# Patient Record
Sex: Female | Born: 1935 | Race: White | Hispanic: No
Health system: Midwestern US, Community
[De-identification: ages and names within clinical notes are randomized; demographics above are authoritative.]

## PROBLEM LIST (undated history)

## (undated) DIAGNOSIS — I739 Peripheral vascular disease, unspecified: Secondary | ICD-10-CM

## (undated) DIAGNOSIS — K449 Diaphragmatic hernia without obstruction or gangrene: Secondary | ICD-10-CM

## (undated) DIAGNOSIS — J310 Chronic rhinitis: Secondary | ICD-10-CM

## (undated) DIAGNOSIS — N879 Dysplasia of cervix uteri, unspecified: Secondary | ICD-10-CM

## (undated) DIAGNOSIS — F32A Depression, unspecified: Secondary | ICD-10-CM

## (undated) DIAGNOSIS — T8859XA Other complications of anesthesia, initial encounter: Secondary | ICD-10-CM

## (undated) DIAGNOSIS — I872 Venous insufficiency (chronic) (peripheral): Secondary | ICD-10-CM

## (undated) DIAGNOSIS — Z853 Personal history of malignant neoplasm of breast: Secondary | ICD-10-CM

## (undated) DIAGNOSIS — T4145XA Adverse effect of unspecified anesthetic, initial encounter: Secondary | ICD-10-CM

## (undated) DIAGNOSIS — N183 Chronic kidney disease, stage 3 unspecified: Secondary | ICD-10-CM

## (undated) DIAGNOSIS — K219 Gastro-esophageal reflux disease without esophagitis: Secondary | ICD-10-CM

## (undated) DIAGNOSIS — I219 Acute myocardial infarction, unspecified: Secondary | ICD-10-CM

## (undated) DIAGNOSIS — M1990 Unspecified osteoarthritis, unspecified site: Secondary | ICD-10-CM

## (undated) DIAGNOSIS — Z860101 Personal history of adenomatous and serrated colon polyps: Secondary | ICD-10-CM

## (undated) DIAGNOSIS — R7303 Prediabetes: Secondary | ICD-10-CM

## (undated) DIAGNOSIS — Z9889 Other specified postprocedural states: Secondary | ICD-10-CM

## (undated) DIAGNOSIS — Z87410 Personal history of cervical dysplasia: Secondary | ICD-10-CM

## (undated) DIAGNOSIS — C50911 Malignant neoplasm of unspecified site of right female breast: Secondary | ICD-10-CM

## (undated) DIAGNOSIS — D649 Anemia, unspecified: Secondary | ICD-10-CM

## (undated) DIAGNOSIS — E785 Hyperlipidemia, unspecified: Secondary | ICD-10-CM

## (undated) DIAGNOSIS — I10 Essential (primary) hypertension: Secondary | ICD-10-CM

## (undated) DIAGNOSIS — D509 Iron deficiency anemia, unspecified: Secondary | ICD-10-CM

## (undated) DIAGNOSIS — I771 Stricture of artery: Secondary | ICD-10-CM

## (undated) DIAGNOSIS — Z8679 Personal history of other diseases of the circulatory system: Secondary | ICD-10-CM

## (undated) DIAGNOSIS — G709 Myoneural disorder, unspecified: Secondary | ICD-10-CM

## (undated) DIAGNOSIS — F40240 Claustrophobia: Secondary | ICD-10-CM

## (undated) DIAGNOSIS — Z86010 Personal history of colonic polyps: Secondary | ICD-10-CM

## (undated) DIAGNOSIS — I499 Cardiac arrhythmia, unspecified: Secondary | ICD-10-CM

## (undated) DIAGNOSIS — I774 Celiac artery compression syndrome: Secondary | ICD-10-CM

## (undated) DIAGNOSIS — M810 Age-related osteoporosis without current pathological fracture: Secondary | ICD-10-CM

## (undated) DIAGNOSIS — R112 Nausea with vomiting, unspecified: Secondary | ICD-10-CM

## (undated) DIAGNOSIS — Z9989 Dependence on other enabling machines and devices: Secondary | ICD-10-CM

## (undated) DIAGNOSIS — G4733 Obstructive sleep apnea (adult) (pediatric): Secondary | ICD-10-CM

## (undated) DIAGNOSIS — Z8719 Personal history of other diseases of the digestive system: Secondary | ICD-10-CM

## (undated) DIAGNOSIS — E7800 Pure hypercholesterolemia, unspecified: Secondary | ICD-10-CM

## (undated) DIAGNOSIS — K559 Vascular disorder of intestine, unspecified: Secondary | ICD-10-CM

## (undated) DIAGNOSIS — R011 Cardiac murmur, unspecified: Secondary | ICD-10-CM

## (undated) HISTORY — PX: CATARACT EXTRACTION, BILATERAL: SHX1313

## (undated) HISTORY — PX: COLPOSCOPY: SHX161

## (undated) HISTORY — PX: FRACTURE SURGERY: SHX138

## (undated) HISTORY — PX: ORIF WRIST FRACTURE: SHX2133

## (undated) HISTORY — PX: TUBAL LIGATION: SHX77

## (undated) HISTORY — DX: Age-related osteoporosis without current pathological fracture: M81.0

## (undated) HISTORY — PX: KNEE ARTHROSCOPY: SUR90

## (undated) HISTORY — PX: JOINT REPLACEMENT: SHX530

## (undated) HISTORY — PX: WRIST FRACTURE SURGERY: SHX121

## (undated) HISTORY — PX: CERVICAL CONE BIOPSY: SUR198

## (undated) HISTORY — DX: Acute myocardial infarction, unspecified: I21.9

## (undated) HISTORY — DX: Unspecified osteoarthritis, unspecified site: M19.90

## (undated) HISTORY — DX: Pure hypercholesterolemia, unspecified: E78.00

## (undated) HISTORY — DX: Dysplasia of cervix uteri, unspecified: N87.9

## (undated) HISTORY — DX: Essential (primary) hypertension: I10

## (undated) HISTORY — DX: Vascular disorder of intestine, unspecified: K55.9

---

## 1973-03-05 HISTORY — PX: TUBAL LIGATION: SHX77

## 1997-09-14 ENCOUNTER — Emergency Department (HOSPITAL_COMMUNITY): Admission: EM | Admit: 1997-09-14 | Discharge: 1997-09-14 | Payer: Self-pay | Admitting: Emergency Medicine

## 1998-01-25 ENCOUNTER — Other Ambulatory Visit: Admission: RE | Admit: 1998-01-25 | Discharge: 1998-01-25 | Payer: Self-pay | Admitting: Gynecology

## 1998-03-05 DIAGNOSIS — Z923 Personal history of irradiation: Secondary | ICD-10-CM

## 1998-03-05 DIAGNOSIS — C801 Malignant (primary) neoplasm, unspecified: Secondary | ICD-10-CM

## 1998-03-05 DIAGNOSIS — Z9221 Personal history of antineoplastic chemotherapy: Secondary | ICD-10-CM

## 1998-03-05 HISTORY — DX: Personal history of antineoplastic chemotherapy: Z92.21

## 1998-03-05 HISTORY — PX: BREAST LUMPECTOMY WITH AXILLARY LYMPH NODE BIOPSY: SHX5593

## 1998-03-05 HISTORY — PX: BREAST BIOPSY: SHX20

## 1998-03-05 HISTORY — DX: Personal history of irradiation: Z92.3

## 1998-03-05 HISTORY — PX: BREAST LUMPECTOMY: SHX2

## 1998-03-05 HISTORY — DX: Malignant (primary) neoplasm, unspecified: C80.1

## 1998-03-23 ENCOUNTER — Other Ambulatory Visit: Admission: RE | Admit: 1998-03-23 | Discharge: 1998-03-23 | Payer: Self-pay | Admitting: Radiology

## 1998-04-01 ENCOUNTER — Ambulatory Visit (HOSPITAL_BASED_OUTPATIENT_CLINIC_OR_DEPARTMENT_OTHER): Admission: RE | Admit: 1998-04-01 | Discharge: 1998-04-01 | Payer: Self-pay | Admitting: General Surgery

## 1998-04-08 ENCOUNTER — Encounter: Payer: Self-pay | Admitting: General Surgery

## 1998-04-08 ENCOUNTER — Ambulatory Visit (HOSPITAL_COMMUNITY): Admission: RE | Admit: 1998-04-08 | Discharge: 1998-04-09 | Payer: Self-pay | Admitting: General Surgery

## 1998-04-14 ENCOUNTER — Encounter: Admission: RE | Admit: 1998-04-14 | Discharge: 1998-07-13 | Payer: Self-pay | Admitting: General Surgery

## 1998-04-14 ENCOUNTER — Encounter: Admission: RE | Admit: 1998-04-14 | Discharge: 1998-07-13 | Payer: Self-pay | Admitting: Radiation Oncology

## 1998-04-27 ENCOUNTER — Ambulatory Visit (HOSPITAL_COMMUNITY): Admission: RE | Admit: 1998-04-27 | Discharge: 1998-04-27 | Payer: Self-pay | Admitting: Oncology

## 1998-04-29 ENCOUNTER — Ambulatory Visit (HOSPITAL_COMMUNITY): Admission: RE | Admit: 1998-04-29 | Discharge: 1998-04-29 | Payer: Self-pay | Admitting: Oncology

## 1998-05-02 ENCOUNTER — Ambulatory Visit (HOSPITAL_COMMUNITY): Admission: RE | Admit: 1998-05-02 | Discharge: 1998-05-02 | Payer: Self-pay | Admitting: Oncology

## 1998-07-15 ENCOUNTER — Encounter: Payer: Self-pay | Admitting: Oncology

## 1998-07-15 ENCOUNTER — Inpatient Hospital Stay (HOSPITAL_COMMUNITY): Admission: EM | Admit: 1998-07-15 | Discharge: 1998-07-17 | Payer: Self-pay | Admitting: *Deleted

## 1998-08-05 ENCOUNTER — Encounter: Admission: RE | Admit: 1998-08-05 | Discharge: 1998-11-03 | Payer: Self-pay | Admitting: Radiation Oncology

## 1999-02-15 ENCOUNTER — Other Ambulatory Visit: Admission: RE | Admit: 1999-02-15 | Discharge: 1999-02-15 | Payer: Self-pay | Admitting: Gynecology

## 1999-03-24 ENCOUNTER — Encounter: Payer: Self-pay | Admitting: Orthopedic Surgery

## 1999-03-24 ENCOUNTER — Ambulatory Visit (HOSPITAL_COMMUNITY): Admission: RE | Admit: 1999-03-24 | Discharge: 1999-03-24 | Payer: Self-pay | Admitting: Orthopedic Surgery

## 1999-04-14 ENCOUNTER — Ambulatory Visit (HOSPITAL_COMMUNITY): Admission: RE | Admit: 1999-04-14 | Discharge: 1999-04-14 | Payer: Self-pay | Admitting: Gastroenterology

## 1999-09-08 ENCOUNTER — Encounter: Admission: RE | Admit: 1999-09-08 | Discharge: 1999-09-08 | Payer: Self-pay | Admitting: Gastroenterology

## 1999-09-08 ENCOUNTER — Encounter: Payer: Self-pay | Admitting: Gastroenterology

## 2000-02-11 ENCOUNTER — Encounter: Payer: Self-pay | Admitting: Emergency Medicine

## 2000-02-11 ENCOUNTER — Emergency Department (HOSPITAL_COMMUNITY): Admission: EM | Admit: 2000-02-11 | Discharge: 2000-02-11 | Payer: Self-pay | Admitting: Emergency Medicine

## 2000-02-16 ENCOUNTER — Ambulatory Visit (HOSPITAL_BASED_OUTPATIENT_CLINIC_OR_DEPARTMENT_OTHER): Admission: RE | Admit: 2000-02-16 | Discharge: 2000-02-16 | Payer: Self-pay | Admitting: Orthopedic Surgery

## 2000-02-23 ENCOUNTER — Other Ambulatory Visit: Admission: RE | Admit: 2000-02-23 | Discharge: 2000-02-23 | Payer: Self-pay | Admitting: Gynecology

## 2000-03-01 ENCOUNTER — Emergency Department (HOSPITAL_COMMUNITY): Admission: EM | Admit: 2000-03-01 | Discharge: 2000-03-01 | Payer: Self-pay | Admitting: Emergency Medicine

## 2000-03-29 ENCOUNTER — Emergency Department (HOSPITAL_COMMUNITY): Admission: EM | Admit: 2000-03-29 | Discharge: 2000-03-30 | Payer: Self-pay | Admitting: Internal Medicine

## 2000-04-26 ENCOUNTER — Other Ambulatory Visit: Admission: RE | Admit: 2000-04-26 | Discharge: 2000-04-26 | Payer: Self-pay | Admitting: Gynecology

## 2000-04-26 ENCOUNTER — Encounter (INDEPENDENT_AMBULATORY_CARE_PROVIDER_SITE_OTHER): Payer: Self-pay

## 2000-07-24 ENCOUNTER — Encounter: Payer: Self-pay | Admitting: Cardiology

## 2000-07-24 ENCOUNTER — Ambulatory Visit (HOSPITAL_COMMUNITY): Admission: RE | Admit: 2000-07-24 | Discharge: 2000-07-24 | Payer: Self-pay | Admitting: Cardiology

## 2001-02-19 ENCOUNTER — Other Ambulatory Visit: Admission: RE | Admit: 2001-02-19 | Discharge: 2001-02-19 | Payer: Self-pay | Admitting: Gynecology

## 2001-02-21 ENCOUNTER — Emergency Department (HOSPITAL_COMMUNITY): Admission: EM | Admit: 2001-02-21 | Discharge: 2001-02-21 | Payer: Self-pay

## 2001-02-21 ENCOUNTER — Encounter: Payer: Self-pay | Admitting: Gastroenterology

## 2001-02-21 ENCOUNTER — Encounter: Admission: RE | Admit: 2001-02-21 | Discharge: 2001-02-21 | Payer: Self-pay | Admitting: Gastroenterology

## 2001-03-04 ENCOUNTER — Encounter: Admission: RE | Admit: 2001-03-04 | Discharge: 2001-03-04 | Payer: Self-pay | Admitting: Orthopedic Surgery

## 2001-03-04 ENCOUNTER — Encounter: Payer: Self-pay | Admitting: Orthopedic Surgery

## 2001-04-30 ENCOUNTER — Encounter: Payer: Self-pay | Admitting: Orthopedic Surgery

## 2001-05-02 ENCOUNTER — Ambulatory Visit (HOSPITAL_COMMUNITY): Admission: RE | Admit: 2001-05-02 | Discharge: 2001-05-02 | Payer: Self-pay | Admitting: Orthopedic Surgery

## 2001-10-17 ENCOUNTER — Encounter: Payer: Self-pay | Admitting: Cardiology

## 2001-10-17 ENCOUNTER — Encounter: Admission: RE | Admit: 2001-10-17 | Discharge: 2001-10-17 | Payer: Self-pay | Admitting: Cardiology

## 2001-10-27 ENCOUNTER — Emergency Department (HOSPITAL_COMMUNITY): Admission: EM | Admit: 2001-10-27 | Discharge: 2001-10-27 | Payer: Self-pay | Admitting: Emergency Medicine

## 2001-12-02 ENCOUNTER — Encounter: Payer: Self-pay | Admitting: Gastroenterology

## 2001-12-02 ENCOUNTER — Emergency Department (HOSPITAL_COMMUNITY): Admission: EM | Admit: 2001-12-02 | Discharge: 2001-12-02 | Payer: Self-pay | Admitting: Emergency Medicine

## 2001-12-03 ENCOUNTER — Encounter: Payer: Self-pay | Admitting: Gastroenterology

## 2001-12-03 ENCOUNTER — Ambulatory Visit (HOSPITAL_COMMUNITY): Admission: RE | Admit: 2001-12-03 | Discharge: 2001-12-03 | Payer: Self-pay | Admitting: Gastroenterology

## 2002-03-20 ENCOUNTER — Other Ambulatory Visit: Admission: RE | Admit: 2002-03-20 | Discharge: 2002-03-20 | Payer: Self-pay | Admitting: Gynecology

## 2003-09-22 ENCOUNTER — Ambulatory Visit (HOSPITAL_BASED_OUTPATIENT_CLINIC_OR_DEPARTMENT_OTHER): Admission: RE | Admit: 2003-09-22 | Discharge: 2003-09-22 | Payer: Self-pay | Admitting: Orthopedic Surgery

## 2003-09-22 ENCOUNTER — Ambulatory Visit (HOSPITAL_COMMUNITY): Admission: RE | Admit: 2003-09-22 | Discharge: 2003-09-22 | Payer: Self-pay | Admitting: Orthopedic Surgery

## 2004-01-24 ENCOUNTER — Ambulatory Visit: Payer: Self-pay | Admitting: Oncology

## 2004-03-24 ENCOUNTER — Other Ambulatory Visit: Admission: RE | Admit: 2004-03-24 | Discharge: 2004-03-24 | Payer: Self-pay | Admitting: Gynecology

## 2004-12-09 ENCOUNTER — Emergency Department (HOSPITAL_COMMUNITY): Admission: EM | Admit: 2004-12-09 | Discharge: 2004-12-09 | Payer: Self-pay | Admitting: Emergency Medicine

## 2005-01-31 ENCOUNTER — Ambulatory Visit: Payer: Self-pay | Admitting: Oncology

## 2005-03-28 ENCOUNTER — Other Ambulatory Visit: Admission: RE | Admit: 2005-03-28 | Discharge: 2005-03-28 | Payer: Self-pay | Admitting: Gynecology

## 2006-01-20 ENCOUNTER — Ambulatory Visit: Payer: Self-pay | Admitting: Oncology

## 2006-01-23 LAB — COMPREHENSIVE METABOLIC PANEL
ALT: 16 U/L (ref 0–35)
CO2: 27 mEq/L (ref 19–32)
Chloride: 103 mEq/L (ref 96–112)
Sodium: 139 mEq/L (ref 135–145)
Total Bilirubin: 0.5 mg/dL (ref 0.3–1.2)
Total Protein: 8.1 g/dL (ref 6.0–8.3)

## 2006-01-23 LAB — CBC WITH DIFFERENTIAL/PLATELET
BASO%: 0.4 % (ref 0.0–2.0)
LYMPH%: 21.9 % (ref 14.0–48.0)
MCHC: 34.1 g/dL (ref 32.0–36.0)
MONO#: 0.4 10*3/uL (ref 0.1–0.9)
RBC: 3.82 10*6/uL (ref 3.70–5.32)
WBC: 4.6 10*3/uL (ref 3.9–10.0)
lymph#: 1 10*3/uL (ref 0.9–3.3)

## 2006-01-23 LAB — LACTATE DEHYDROGENASE: LDH: 137 U/L (ref 94–250)

## 2006-04-18 ENCOUNTER — Other Ambulatory Visit: Admission: RE | Admit: 2006-04-18 | Discharge: 2006-04-18 | Payer: Self-pay | Admitting: Gynecology

## 2006-11-11 ENCOUNTER — Ambulatory Visit (HOSPITAL_COMMUNITY): Admission: RE | Admit: 2006-11-11 | Discharge: 2006-11-11 | Payer: Self-pay | Admitting: Cardiology

## 2007-01-15 ENCOUNTER — Ambulatory Visit: Payer: Self-pay | Admitting: Oncology

## 2007-01-17 LAB — COMPREHENSIVE METABOLIC PANEL
ALT: 11 U/L (ref 0–35)
AST: 15 U/L (ref 0–37)
Alkaline Phosphatase: 66 U/L (ref 39–117)
Creatinine, Ser: 1.17 mg/dL (ref 0.40–1.20)
Total Bilirubin: 0.3 mg/dL (ref 0.3–1.2)

## 2007-01-17 LAB — CBC WITH DIFFERENTIAL/PLATELET
BASO%: 0.3 % (ref 0.0–2.0)
EOS%: 1.2 % (ref 0.0–7.0)
HCT: 32.7 % — ABNORMAL LOW (ref 34.8–46.6)
LYMPH%: 28.8 % (ref 14.0–48.0)
MCH: 32.8 pg (ref 26.0–34.0)
MCHC: 35.9 g/dL (ref 32.0–36.0)
MCV: 91.5 fL (ref 81.0–101.0)
MONO%: 7.3 % (ref 0.0–13.0)
NEUT%: 62.4 % (ref 39.6–76.8)
Platelets: 257 10*3/uL (ref 145–400)
lymph#: 1.7 10*3/uL (ref 0.9–3.3)

## 2007-02-28 ENCOUNTER — Ambulatory Visit: Payer: Self-pay | Admitting: Oncology

## 2007-04-25 ENCOUNTER — Other Ambulatory Visit: Admission: RE | Admit: 2007-04-25 | Discharge: 2007-04-25 | Payer: Self-pay | Admitting: Gynecology

## 2007-08-06 ENCOUNTER — Inpatient Hospital Stay (HOSPITAL_COMMUNITY): Admission: EM | Admit: 2007-08-06 | Discharge: 2007-08-07 | Payer: Self-pay | Admitting: Emergency Medicine

## 2008-03-10 ENCOUNTER — Ambulatory Visit: Payer: Self-pay | Admitting: Oncology

## 2008-03-19 LAB — COMPREHENSIVE METABOLIC PANEL
Albumin: 4.4 g/dL (ref 3.5–5.2)
CO2: 24 mEq/L (ref 19–32)
Calcium: 9.4 mg/dL (ref 8.4–10.5)
Glucose, Bld: 106 mg/dL — ABNORMAL HIGH (ref 70–99)
Potassium: 3.8 mEq/L (ref 3.5–5.3)
Sodium: 141 mEq/L (ref 135–145)
Total Protein: 7.2 g/dL (ref 6.0–8.3)

## 2008-03-19 LAB — CBC WITH DIFFERENTIAL/PLATELET
Eosinophils Absolute: 0.1 10*3/uL (ref 0.0–0.5)
MONO#: 0.5 10*3/uL (ref 0.1–0.9)
NEUT#: 3.5 10*3/uL (ref 1.5–6.5)
Platelets: 226 10*3/uL (ref 145–400)
RBC: 3.52 10*6/uL — ABNORMAL LOW (ref 3.70–5.32)
RDW: 13 % (ref 11.3–14.5)
WBC: 5.7 10*3/uL (ref 3.9–10.0)

## 2008-03-19 LAB — LACTATE DEHYDROGENASE: LDH: 144 U/L (ref 94–250)

## 2008-05-07 ENCOUNTER — Other Ambulatory Visit: Admission: RE | Admit: 2008-05-07 | Discharge: 2008-05-07 | Payer: Self-pay | Admitting: Gynecology

## 2008-05-07 ENCOUNTER — Encounter: Payer: Self-pay | Admitting: Gynecology

## 2008-05-07 ENCOUNTER — Ambulatory Visit: Payer: Self-pay | Admitting: Gynecology

## 2008-09-23 ENCOUNTER — Emergency Department (HOSPITAL_COMMUNITY): Admission: EM | Admit: 2008-09-23 | Discharge: 2008-09-24 | Payer: Self-pay | Admitting: Emergency Medicine

## 2009-03-16 ENCOUNTER — Ambulatory Visit (HOSPITAL_BASED_OUTPATIENT_CLINIC_OR_DEPARTMENT_OTHER): Payer: MEDICARE | Admitting: Oncology

## 2009-03-18 LAB — CBC WITH DIFFERENTIAL/PLATELET
EOS%: 1.9 % (ref 0.0–7.0)
HGB: 12 g/dL (ref 11.6–15.9)
MCH: 31.5 pg (ref 25.1–34.0)
MCV: 93.7 fL (ref 79.5–101.0)
MONO%: 10 % (ref 0.0–14.0)
NEUT#: 3.4 10*3/uL (ref 1.5–6.5)
RBC: 3.81 10*6/uL (ref 3.70–5.45)
RDW: 12.7 % (ref 11.2–14.5)
lymph#: 1.7 10*3/uL (ref 0.9–3.3)

## 2009-03-18 LAB — COMPREHENSIVE METABOLIC PANEL
ALT: 18 U/L (ref 0–35)
AST: 19 U/L (ref 0–37)
Albumin: 4.3 g/dL (ref 3.5–5.2)
Alkaline Phosphatase: 58 U/L (ref 39–117)
Calcium: 9.8 mg/dL (ref 8.4–10.5)
Chloride: 103 mEq/L (ref 96–112)
Potassium: 3.7 mEq/L (ref 3.5–5.3)
Sodium: 137 mEq/L (ref 135–145)
Total Protein: 7.4 g/dL (ref 6.0–8.3)

## 2009-07-08 ENCOUNTER — Emergency Department (HOSPITAL_COMMUNITY): Admission: EM | Admit: 2009-07-08 | Discharge: 2009-07-08 | Payer: Self-pay | Admitting: Emergency Medicine

## 2009-09-02 ENCOUNTER — Ambulatory Visit: Payer: Self-pay | Admitting: Gynecology

## 2009-09-02 ENCOUNTER — Other Ambulatory Visit: Admission: RE | Admit: 2009-09-02 | Discharge: 2009-09-02 | Payer: Self-pay | Admitting: Gynecology

## 2010-01-03 ENCOUNTER — Ambulatory Visit: Payer: Self-pay | Admitting: Gynecology

## 2010-01-11 ENCOUNTER — Ambulatory Visit: Payer: Self-pay | Admitting: Gynecology

## 2010-03-26 ENCOUNTER — Encounter: Payer: Self-pay | Admitting: Cardiology

## 2010-04-07 ENCOUNTER — Ambulatory Visit: Payer: MEDICARE | Admitting: Oncology

## 2010-04-07 DIAGNOSIS — C50919 Malignant neoplasm of unspecified site of unspecified female breast: Secondary | ICD-10-CM

## 2010-04-07 DIAGNOSIS — Z170 Estrogen receptor positive status [ER+]: Secondary | ICD-10-CM

## 2010-04-07 LAB — CBC WITH DIFFERENTIAL/PLATELET
BASO%: 0.4 % (ref 0.0–2.0)
EOS%: 0.9 % (ref 0.0–7.0)
Eosinophils Absolute: 0 10*3/uL (ref 0.0–0.5)
LYMPH%: 25.8 % (ref 14.0–49.7)
MCH: 32.4 pg (ref 25.1–34.0)
MCHC: 34.5 g/dL (ref 31.5–36.0)
MCV: 93.9 fL (ref 79.5–101.0)
MONO%: 8.4 % (ref 0.0–14.0)
Platelets: 251 10*3/uL (ref 145–400)
RBC: 3.66 10*6/uL — ABNORMAL LOW (ref 3.70–5.45)

## 2010-04-07 LAB — COMPREHENSIVE METABOLIC PANEL
Albumin: 4.6 g/dL (ref 3.5–5.2)
Alkaline Phosphatase: 61 U/L (ref 39–117)
BUN: 21 mg/dL (ref 6–23)
Calcium: 9.5 mg/dL (ref 8.4–10.5)
Creatinine, Ser: 0.95 mg/dL (ref 0.40–1.20)
Glucose, Bld: 91 mg/dL (ref 70–99)
Potassium: 4.2 mEq/L (ref 3.5–5.3)

## 2010-04-07 LAB — MORPHOLOGY

## 2010-04-21 ENCOUNTER — Encounter (HOSPITAL_BASED_OUTPATIENT_CLINIC_OR_DEPARTMENT_OTHER): Payer: MEDICARE | Admitting: Oncology

## 2010-04-21 DIAGNOSIS — C50919 Malignant neoplasm of unspecified site of unspecified female breast: Secondary | ICD-10-CM

## 2010-07-21 NOTE — Op Note (Signed)
NAME:  Wanda Green, Wanda Green                         ACCOUNT NO.:  [id number]   MEDICAL RECORD NO.:  [id number]                   PATIENT TYPE:  AMB   LOCATION:  DSC                                  FACILITY:  [facility]   PHYSICIAN:  Marlowe Kays, M.D.               DATE OF BIRTH:  24-Dec-1934   DATE OF PROCEDURE:  09/22/2003  DATE OF DISCHARGE:                                 OPERATIVE REPORT   PREOPERATIVE DIAGNOSIS:  Internal derangement of left knee.   POSTOPERATIVE DIAGNOSIS:  Scuffing injury to medial femoral condyle,  possible tear of anterior horn lateral meniscus with disruption of  ligamentum mucosum, left knee.   OPERATION PERFORMED:  Left knee arthroscopy with shaving of the medial  femoral condyle, minimal shaving of patella and debridement of lateral  compartment of knee joint.   SURGEON:  Marlowe Kays, M.D.   ASSISTANT:  Nurse.   ANESTHESIA:  General.   INDICATIONS FOR PROCEDURE:  The patient has had history of a right knee  arthroscopy.  She had acute injury to her left knee on July 7, has had  persistent lateral pain.  Plain x-rays were normal.  She declined an MRI.  Because of the severity of her symptoms she has requested a knee  arthroscopy.  See operative description below for additional details.   DESCRIPTION OF PROCEDURE:  Satisfactory general anesthesia.  Pneumatic  tourniquet.  Thigh stabilizer.  Left knee was prepped with DuraPrep and  draped in sterile field.  The right leg was protected with Ace wrap and knee  support.  Superomedial saline inflow.  First through an anterolateral  portal, medial compartment knee joint was evaluated.  She had a little bit  of synovitis present which I resected and had a scuffing injury to the  medial femoral condyle which I shaved down until smooth.  I was rate this as  grade 2 out of 4.  The medial meniscus was normal on probing.  Looking at  the medial gutter and suprapatellar area she had a little wear of the  patella  which I did debride down. I then reversed portals.  The ACL was  intact.  She had disrupted the ligamentum mucosum which appeared to have  long strands going into the joint as well as a fairly flagrant synovitis.  All of this I resected with a 3.5 shaver.  She also had a small nubbin of  tear which could have been old of the anterior intercondylar area of the  lateral meniscus which I debrided out as well.  The lateral meniscus itself  looked normal on probing and the joint surfaces were unremarkable.  The knee  joint was then irrigated until clear outflow fluid possible was removed.  The two anterior portals were closed with 4-0 nylon.  I injected through the  inflow portal, 20 mL of 0.5% Marcaine with Adrenalin, removed the cannula  and closed this portal with  4-0 nylon as well.  Betadine, Adaptic, dry  sterile dressing were applied.  Tourniquet was released.  At the time of  this dictation she was on her way to the recovery room  in satisfactory  condition with no complication.                                               Marlowe Kays, M.D.    JA/MEDQ  D:  09/22/2003  T:  09/22/2003  Job:  [id number]

## 2010-07-21 NOTE — Procedures (Signed)
[place]. [facility]  Patient:    Wanda Green, Wanda Green                        MRN: [id number] Proc. Date: 04/14/99 Adm. Date:  [id number] Attending:  Charna Elizabeth CC:         Adolph Pollack, M.D.             Genene Churn. Cyndie Chime, M.D.                           Procedure Report  DATE OF BIRTH:  14-Aug-1934.  REFERRING PHYSICIAN:  PROCEDURE PERFORMED:  Colonoscopy with biopsies.  ENDOSCOPIST:  Anselmo Rod, M.D.  INSTRUMENT USED:  Olympus video colonoscope.  INDICATIONS FOR PROCEDURE:  Blood in stool and personal history of breast cancer in a 76 year old white female, rule out polyps, masses, hemorrhoids, etc.  PREPROCEDURE PREPARATION:  Informed consent was procured from the patient.  The  patient was fasted for eight hours prior to the procedure and prepped with a bottle of magnesium citrate and a gallon of NuLytely the night prior to the procedure.  PREPROCEDURE PHYSICAL:  The patient had stable vital signs.  Neck supple. Chest clear to auscultation.  S1, S2 regular.  Abdomen soft with normal abdominal bowel sounds.  DESCRIPTION OF PROCEDURE:  The patient was placed in the left lateral decubitus  position and sedated with 60 mg of Demerol and 5 mg of Versed intravenously. Once the patient was adequately sedated and maintained on low-flow oxygen and continuous cardiac monitoring, the Olympus video colonoscope was advanced from the rectum o the cecum without difficulty.  There was some residual stool in the colon but visualization was adequate.  The procedure was completed up to the cecum. A small erythematous fold was seen at 60 cm.  It was biopsied for pathology.  No other masses, polyps, erosions, ulcerations or diverticulosis was present.  The patient tolerated the procedure well without complication.  IMPRESSION:  Mild erythematous changes at 60 cm.  Biopsied for pathology. Otherwise normal colonoscopy with no masses or polyps  present.  RECOMMENDATIONS: 1. Await pathology results. 2. Follow up on an outpatient basis in the next two weeks.DD:  04/14/99 TD:  04/15/99 Job: [id number]

## 2010-07-21 NOTE — Op Note (Signed)
[place]. [facility]  Patient:    Wanda Green, Wanda Green                      MRN: [id number] Proc. Date: 02/16/00 Adm. Date:  [id number] Disc. Date: [id number] Attending:  Sandi Raveling CC:         Dr. Burnett Kanaris  Anesthesia   Operative Report  PREOPERATIVE DIAGNOSIS:  Comminuted intraarticular fracture of left distal radius with impaction of lunate facet and loss of normal volar tilt and radial slope.  POSTOPERATIVE DIAGNOSIS:  Comminuted intraarticular fracture of left distal radius with impaction of lunate facet and loss of normal volar tilt and radial slope.  OPERATION:  Open reduction/internal fixation with a volar buttress plate with distal threaded pegs.  SURGEON:  Katy Fitch. Sypher, Montez Hageman., M.D.  ASSISTANT:  Artist Pais. Mina Marble, M.D. and Marveen Reeks Dasnoit, P.A.-C.  ANESTHESIA:  Axillary block.  ANESTHESIOLOGIST:  Dr. Krista Blue.  INDICATIONS:  Wanda Green is a 76 year old woman, who fell in her horse barn at home sustaining a comminuted intraarticular, impacted fracture of the left distal radius.  She was seen at [facility] emergency room, placed in a splint and referred to our office for clinical evaluation and upper extremity orthopedic consult.  Physical examination at the office revealed that she had minimal swelling of her hand and normal neurovascular examination.  Her x-rays revealed a comminuted intraarticular fracture with particular impaction and displacement of the lunate facet.  She had considerable dorsal comminution and loss of her normal volar tilt and radial slope.  Given her demands at home and the need for her to have an anatomic reduction, we recommended proceeding with open reduction/internal fixation using a volar plate with distal pegs to support the articular surfaces.  Surgery, aftercare, potential risks and benefits were detailed.  DESCRIPTION OF PROCEDURE:  Wanda Green was brought to the operating room and placed  supine position on the operating room table.  Following placement of an axillary block in the holding area, anesthesia was satisfactory in the left arm.  The arm was prepped with Betadine soap and solution and sterilely draped. Upon exsanguination of the limb with Esmarch bandage, arterial tourniquet was inflated to 270 mmHg due to systolic hypertension.  Procedure commenced with an 8 cm incision with a Brunners zigzag across the wrist flexion creases. Subcutaneous tissues were carefully divided, taking care to spare the radial sensory branches.  The sheath of the flexor carpi ulnaris was incised longitudinally to the scaphoid tubercle and the flexor carpi radialis retracted in an ulnar direction.  The perforating vessels from the radial artery were electrocauterized with bipolar current.  The flexor pollicis longus was retracted in an ulnar direction and the pronator quadratus was lifted off the distal radius with an L-shaped incision distally and radially. The fracture site was easily identified.  With gentle manipulation of the fracture, anatomic reduction of the palmar cortex of the scaphoid and lunate facets were anatomically approximated.  Volar tilt was established by using an elevator elevating the fracture fragments.  A left side plate was placed with a provisional screw and C-arm fluoroscopy documented anatomic reduction.  The threaded pegs were placed according to the hand innovations protocol.  The C-arm fluoroscope was used to control screw placement.  All screws and pegs were placed satisfactorily.  An anatomic reduction was achieved.  This was documented with C-arm fluoroscopy and digital images.  The wound was then thoroughly irrigated with sterile saline followed by repair of the  pronator quadratus with mattress sutures of 3-0 Vicryl followed by repair of the skin with subdermal sutures of 3-0 Vicryl and intradermal 3-0 Prolene with Steri-Strips.  A compressive hand  and forearm dressing was applied with a sugar-tong splint maintaining the forearm in a neutral position.  There were no apparent complications.  Wanda Green tolerated surgery and anesthesia well.  She was transferred to recovery room with stable vital signs. DD:  02/16/00 TD:  02/16/00 Job: [id number]

## 2010-07-21 NOTE — Op Note (Signed)
[place]. [facility]  Patient:    Wanda Green, Wanda Green Visit Number: [id number] MRN: [id number]          Service Type: DSU Location: [facility] 2899 28 Attending Physician:  Marlowe Kays Page Dictated by:   Illene Labrador. Aplington, M.D. Proc. Date: 05/02/01 Admit Date:  05/02/2001                             Operative Report  PREOPERATIVE DIAGNOSES:  Medial compartment pain - right knee with degenerative arthritis and possible medial meniscus tear.  POSTOPERATIVE DIAGNOSIS:  Medial compartment arthritis - right knee.  OPERATION:  Right knee arthroscopy with debridement of medial femoral condyle.  SURGEON:  Illene Labrador. Aplington, M.D.  ASSISTANT:  Nurse.  ANESTHESIA:  General.  JUSTIFICATION FOR PROCEDURE:  She sustained a twisting injury to her right knee around early to mid-December of last year.  She has had persistent pain inner aspect of the knee with x-rays showing what was initially thought was a fracture of the medial tibial spine.  MRI demonstrated no fracture, but possible torn medial meniscus along with some degenerative arthritis in the medial compartment of the knee joint.  Because of her persistent pain, she is here today for surgery.  She has had no relief from an intraarticular steroid injection.  PROCEDURE:  Satisfactory general anesthesia, pneumatic tourniquet placed, stabilizer, right knee was prepped with Duraprep, draped in a sterile field. Superomedial saline inflow.  First through anteromedial portal, the medial compartment of the knee joint was evaluated.  There was some modest synovitis present, which I resected.  She had grade 2-3/4 chondromalacia of basically all the weightbearing surfaces of the medial femoral condyle.  There was no raw bone apparent.  All of this was gently shaved down with a 3.5 shaver.  The medial meniscus was well-visualized and was normal on probing, other than a very minimal wrinkle at the posterior curve,  which is what was pictured on the MRI, but this, in my estimation, did not require any surgical treatment, picture was taken.  Her ACL was intact.  ______ area no abnormalities were noted.  I then reversed portals.  Laterally, she had some minimal fraying of the mid curve of the lateral meniscus.  It was not a frank tear, but I did gently smooth it down with 3.5 shaver.  She had some mild wear of the lateral tibial plateau, otherwise the lateral compartment of the knee joint looked unremarkable.  ______ lateral ______ area, no additional abnormalities were noted.  The knee joint was then irrigated until clear and all fluid possible removed.  The ______ portals were closed with 4-0 nylon, 20 cc 0.5% Marcaine with adrenalin was instilled through the inflow apparatus, which was removed and this portal was closed with 4-0 nylon as well.  Betadine, Adaptic, dry sterile dressing were applied, tourniquet was released.  She tolerated the procedure well and was taken to recovery room in satisfactory condition with no known complications. Dictated by:   Illene Labrador. Aplington, M.D. Attending Physician:  Joaquin Courts DD:  05/02/01 TD:  05/02/01 Job: [id number]

## 2010-11-30 ENCOUNTER — Encounter: Payer: Self-pay | Admitting: Gynecology

## 2010-11-30 LAB — COMPREHENSIVE METABOLIC PANEL
Alkaline Phosphatase: 56
BUN: 9
CO2: 24
Calcium: 9.2
GFR calc non Af Amer: >60
Glucose, Bld: 140 — ABNORMAL HIGH
Total Protein: 6.9

## 2010-11-30 LAB — DIFFERENTIAL
Basophils Relative: 0
Eosinophils Absolute: 0
Eosinophils Relative: 0
Lymphocytes Relative: 5 — ABNORMAL LOW
Monocytes Absolute: 0.8
Neutrophils Relative %: 90 — ABNORMAL HIGH

## 2010-11-30 LAB — BASIC METABOLIC PANEL
BUN: 7
CO2: 25
Calcium: 8.6
Calcium: 8.9
Chloride: 109
Creatinine, Ser: 0.78
Creatinine, Ser: 0.8
GFR calc Af Amer: >60
GFR calc non Af Amer: >60
Sodium: 139

## 2010-11-30 LAB — CBC
HCT: 35.6 — ABNORMAL LOW
Hemoglobin: 12.3
MCHC: 34.4
MCHC: 34.5
MCHC: 34.8
MCV: 92.8
Platelets: 167
RBC: 3.54 — ABNORMAL LOW
RBC: 3.85 — ABNORMAL LOW
RDW: 12.3
RDW: 12.9
WBC: 8.1

## 2010-11-30 LAB — PROTIME-INR
INR: 1.1
Prothrombin Time: 13.9

## 2010-12-25 ENCOUNTER — Encounter: Payer: Self-pay | Admitting: Gynecology

## 2010-12-25 ENCOUNTER — Ambulatory Visit (INDEPENDENT_AMBULATORY_CARE_PROVIDER_SITE_OTHER): Payer: BC Managed Care – PPO | Admitting: Gynecology

## 2010-12-25 VITALS — BP 124/80 | Ht 63.0 in | Wt 176.0 lb

## 2010-12-25 DIAGNOSIS — M949 Disorder of cartilage, unspecified: Secondary | ICD-10-CM

## 2010-12-25 DIAGNOSIS — N3941 Urge incontinence: Secondary | ICD-10-CM

## 2010-12-25 DIAGNOSIS — M8580 Other specified disorders of bone density and structure, unspecified site: Secondary | ICD-10-CM

## 2010-12-25 DIAGNOSIS — N952 Postmenopausal atrophic vaginitis: Secondary | ICD-10-CM

## 2010-12-25 MED ORDER — TOLTERODINE TARTRATE ER 4 MG PO CP24: 4.0000 mg | ORAL_CAPSULE | Freq: Every day | ORAL | Status: DC

## 2010-12-25 NOTE — Progress Notes (Signed)
Wanda Green [birth year] [id number]        76 y.o.  for follow up. History of urge incontinence on Detrol 4 mg daily doing well with this. Also history of osteopenia.  History of breast cancer.   Past medical history,surgical history, medications, allergies, family history and social history were all reviewed and documented in the EPIC chart. ROS:  Was performed and pertinent positives and negatives are included in the history.  Exam: chaperone present Filed Vitals:   12/25/10 1047  BP: 124/80   General appearance  Normal Skin grossly normal Head/Neck normal with no cervical or supraclavicular adenopathy thyroid normal Lungs  clear Cardiac RR, without RMG Abdominal  soft, nontender, without masses, organomegaly or hernia Breasts  examined lying and sitting without masses, retractions, discharge or axillary adenopathy.  Right breast with a well-healed lumpectomy scars Pelvic  Ext/BUS/vagina  normal with atrophic genital changes  Cervix  normal    Uterus  axial, normal size, shape and contour, midline and mobile nontender   Adnexa  Without masses or tenderness    Anus and perineum  normal   Rectovaginal  normal sphincter tone without palpated masses or tenderness.    Assessment/Plan:  76 y.o. female 1. Osteopenia.  DEXA November 2011 showed -1.9 T score femoral neck bilaterally. FRAX did not indicate need for treatment.  Increase calcium vitamin D reviewed we'll plan on repeat next year. 2. Atrophic genital changes. Patient is not sexually active and this is not an issue for her at present. We'll monitor. 3. Urge incontinence. Patient on Detrol 4 mg daily doing well and I renewed her prescription times a year we'll check UA today. 4. Health maintenance. History of breast cancer doing well without evidence of disease. Self breast exams on a monthly basis discussed and encouraged. Last mammogram June 2012 we'll continue with annual mammogram. Head history of cone biopsy of the cervix  after she got married a number of years ago. Pap smears have been normal and regular since then. Discussed current screening guidelines. She is over 16 with last Pap smear 2011. We'll hold on screening at this point and redress the issue next year.  Has colonoscopy scheduled end of October with Dr. Matthias Hughs.  No blood work was done today as it's all done through her primary physician's office who follows her for her medical issues. Assuming she continues well from a gynecologic standpoint she will see me in a year sooner as needed.    Dara Lords MD, 11:32 AM 12/25/2010

## 2011-01-05 ENCOUNTER — Emergency Department (HOSPITAL_COMMUNITY): Payer: BC Managed Care – PPO

## 2011-01-05 ENCOUNTER — Emergency Department (HOSPITAL_COMMUNITY)
Admission: EM | Admit: 2011-01-05 | Discharge: 2011-01-05 | Disposition: A | Discharge status: Home / Self Care | Payer: BC Managed Care – PPO | Attending: Emergency Medicine | Admitting: Emergency Medicine

## 2011-01-05 DIAGNOSIS — R112 Nausea with vomiting, unspecified: Secondary | ICD-10-CM | POA: Insufficient documentation

## 2011-01-05 DIAGNOSIS — I10 Essential (primary) hypertension: Secondary | ICD-10-CM | POA: Insufficient documentation

## 2011-01-05 DIAGNOSIS — R109 Unspecified abdominal pain: Secondary | ICD-10-CM | POA: Insufficient documentation

## 2011-01-05 DIAGNOSIS — K5900 Constipation, unspecified: Secondary | ICD-10-CM | POA: Insufficient documentation

## 2011-01-05 LAB — DIFFERENTIAL
Basophils Relative: 0 % (ref 0–1)
Lymphocytes Relative: 3 % — ABNORMAL LOW (ref 12–46)
Lymphs Abs: 0.5 10*3/uL — ABNORMAL LOW (ref 0.7–4.0)
Monocytes Relative: 9 % (ref 3–12)
Neutro Abs: 12.5 10*3/uL — ABNORMAL HIGH (ref 1.7–7.7)
Neutrophils Relative %: 88 % — ABNORMAL HIGH (ref 43–77)

## 2011-01-05 LAB — CBC
HCT: 33.4 % — ABNORMAL LOW (ref 36.0–46.0)
Hemoglobin: 11.7 g/dL — ABNORMAL LOW (ref 12.0–15.0)
MCH: 31.8 pg (ref 26.0–34.0)
RBC: 3.68 MIL/uL — ABNORMAL LOW (ref 3.87–5.11)

## 2011-01-05 LAB — COMPREHENSIVE METABOLIC PANEL
ALT: 12 U/L (ref 0–35)
AST: 17 U/L (ref 0–37)
CO2: 24 mEq/L (ref 19–32)
Chloride: 103 mEq/L (ref 96–112)
Creatinine, Ser: 0.96 mg/dL (ref 0.50–1.10)
GFR calc non Af Amer: 57 mL/min — ABNORMAL LOW (ref >90)
Sodium: 138 mEq/L (ref 135–145)
Total Bilirubin: 0.3 mg/dL (ref 0.3–1.2)

## 2011-01-05 LAB — LACTIC ACID, PLASMA: Lactic Acid, Venous: 1 mmol/L (ref 0.5–2.2)

## 2011-01-05 MED ORDER — IOHEXOL 300 MG/ML  SOLN
100.0000 mL | Freq: Once | INTRAMUSCULAR | Status: AC | PRN
  Administered 2011-01-05: 100 mL via INTRAVENOUS

## 2011-01-07 ENCOUNTER — Other Ambulatory Visit: Payer: Self-pay | Admitting: Gynecology

## 2011-01-08 ENCOUNTER — Encounter: Payer: Self-pay | Admitting: Gynecology

## 2011-03-11 ENCOUNTER — Other Ambulatory Visit: Payer: Self-pay | Admitting: Cardiology

## 2011-04-05 ENCOUNTER — Telehealth: Payer: Self-pay | Admitting: Oncology

## 2011-04-05 NOTE — Telephone Encounter (Signed)
Pt aware of new appts for 3/26 and 4/2  aom

## 2011-04-13 ENCOUNTER — Telehealth: Payer: Self-pay | Admitting: *Deleted

## 2011-04-13 ENCOUNTER — Encounter: Payer: Self-pay | Admitting: Gynecology

## 2011-04-13 NOTE — Telephone Encounter (Signed)
Pt needs a prior authorization letter to the insurance company was she needs brand name Detrol LA. She states generic did not work for her. Will you write a letter or pls advise. Thanks

## 2011-04-13 NOTE — Telephone Encounter (Signed)
Letter typed by Dr Audie Box and faxed to pts insurance. KW

## 2011-04-15 ENCOUNTER — Other Ambulatory Visit: Payer: Self-pay | Admitting: Cardiology

## 2011-04-16 ENCOUNTER — Other Ambulatory Visit: Payer: BC Managed Care – PPO | Admitting: Lab

## 2011-04-19 ENCOUNTER — Encounter: Payer: Self-pay | Admitting: *Deleted

## 2011-04-19 NOTE — Progress Notes (Signed)
Patient ID: Wanda Green, female   DOB: [birth year], 77 y.o.   MRN: [id number] Pt calling to follow up with her prior auth. On Detrol LA 4 mg. I checked with Amy and she said that we are waiting on insurance company to review forms. Left this message on pt voicemail.

## 2011-04-26 ENCOUNTER — Telehealth: Payer: Self-pay | Admitting: *Deleted

## 2011-04-26 NOTE — Telephone Encounter (Signed)
Patient informed prior auth on Detrol LA not approved.  Will try samples of Vesicare 5mg  per TF.

## 2011-05-21 ENCOUNTER — Telehealth: Payer: Self-pay | Admitting: *Deleted

## 2011-05-21 MED ORDER — SOLIFENACIN SUCCINATE 5 MG PO TABS: 5.0000 mg | ORAL_TABLET | Freq: Every day | ORAL | Status: DC

## 2011-05-21 NOTE — Telephone Encounter (Signed)
Pt called stating her vesicare 5 mg works great, rx will be sent to pharmacy.

## 2011-05-28 ENCOUNTER — Other Ambulatory Visit: Payer: BC Managed Care – PPO | Admitting: Lab

## 2011-05-29 ENCOUNTER — Other Ambulatory Visit (HOSPITAL_BASED_OUTPATIENT_CLINIC_OR_DEPARTMENT_OTHER): Payer: Medicare Other

## 2011-05-29 DIAGNOSIS — C50919 Malignant neoplasm of unspecified site of unspecified female breast: Secondary | ICD-10-CM

## 2011-05-29 LAB — CBC WITH DIFFERENTIAL/PLATELET
BASO%: 0.5 % (ref 0.0–2.0)
LYMPH%: 25.3 % (ref 14.0–49.7)
MCHC: 33.7 g/dL (ref 31.5–36.0)
MCV: 93.7 fL (ref 79.5–101.0)
MONO%: 9.6 % (ref 0.0–14.0)
Platelets: 217 10*3/uL (ref 145–400)
RBC: 3.73 10*6/uL (ref 3.70–5.45)

## 2011-05-29 LAB — COMPREHENSIVE METABOLIC PANEL
ALT: 12 U/L (ref 0–35)
Alkaline Phosphatase: 56 U/L (ref 39–117)
Sodium: 138 mEq/L (ref 135–145)
Total Bilirubin: 0.4 mg/dL (ref 0.3–1.2)
Total Protein: 7.3 g/dL (ref 6.0–8.3)

## 2011-06-04 ENCOUNTER — Ambulatory Visit: Payer: BC Managed Care – PPO | Admitting: Oncology

## 2011-06-05 ENCOUNTER — Ambulatory Visit (HOSPITAL_BASED_OUTPATIENT_CLINIC_OR_DEPARTMENT_OTHER): Payer: Medicare Other | Admitting: Oncology

## 2011-06-05 ENCOUNTER — Telehealth: Payer: Self-pay | Admitting: Oncology

## 2011-06-05 VITALS — BP 156/81 | HR 86 | Temp 97.7°F | Ht 63.0 in | Wt 178.9 lb

## 2011-06-05 DIAGNOSIS — Z853 Personal history of malignant neoplasm of breast: Secondary | ICD-10-CM

## 2011-06-05 DIAGNOSIS — I10 Essential (primary) hypertension: Secondary | ICD-10-CM

## 2011-06-05 DIAGNOSIS — C50919 Malignant neoplasm of unspecified site of unspecified female breast: Secondary | ICD-10-CM

## 2011-06-05 NOTE — Progress Notes (Signed)
Hematology and Oncology Follow Up Visit  Wanda Green [id number] 09-12-1934 77 y.o. 06/05/2011 3:35 PM   Principle Diagnosis: Encounter Diagnosis  Name Primary?  . Breast cancer, stage 2 Yes     Interim History:  Follow-up visit for this pleasant 77 year old woman with a history of stage II, 2-node positive, ER positive, cancer of the right breast initially diagnosed in January 2000, treated with lumpectomy, breast radiation, four cycles of Adriamycin, Cytoxan and Taxotere chemotherapy, five years of tamoxifen, and one year of Arimidex. She is doing well at this time. She has had no interim medical problems. She denies any headache or change in vision. No bone pain. No vaginal bleeding or discharge. No change in bowel habit.    Med.ications: reviewed  Allergies:  Allergies  Allergen Reactions  . Tylox     Review of Systems: Constitutional:   No constitutional symptoms Respiratory: No cough or dyspnea Cardiovascular:  No chest pain or palpitations Gastrointestinal: See above Genito-Urinary: See above Musculoskeletal: See above Neurologic: See above Skin: No rash Remaining ROS negative.  Physical Exam: Blood pressure 156/81, pulse 86, temperature 97.7 F (36.5 C), temperature source Oral, height 5\' 3"  (1.6 m), weight 178 lb 14.4 oz (81.149 kg). Wt Readings from Last 3 Encounters:  06/05/11 178 lb 14.4 oz (81.149 kg)  12/25/10 176 lb (79.833 kg)  09/02/09 180 lb (81.647 kg)     General appearance: Well-nourished Caucasian woman HENNT: Pharynx no erythema or exudate Lymph nodes: No cervical supraclavicular or axillary adenopathy Breasts: No dominant mass Lungs: Clear to auscultation resonant to percussion Heart: Regular rhythm no murmur Abdomen: Soft nontender no mass no organomegaly Extremities: No edema no calf tenderness Vascular: No cyanosis Neurologic: Motor strength 5 over 5 reflexes 1+ symmetric Skin: No rash or ecchymosis  Lab Results: Lab Results    Component Value Date   WBC 4.7 05/29/2011   HGB 11.8 05/29/2011   HCT 35.0 05/29/2011   MCV 93.7 05/29/2011   PLT 217 05/29/2011     Chemistry      Component Value Date/Time   NA 138 05/29/2011 1124   K 4.4 05/29/2011 1124   CL 105 05/29/2011 1124   CO2 26 05/29/2011 1124   BUN 23 05/29/2011 1124   CREATININE 1.09 05/29/2011 1124      Component Value Date/Time   CALCIUM 9.8 05/29/2011 1124   ALKPHOS 56 05/29/2011 1124   AST 17 05/29/2011 1124   ALT 12 05/29/2011 1124   BILITOT 0.4 05/29/2011 1124       Radiological Studies: Mammograms done 08/08/2010 showed no new abnormalities   Impression and Plan: #1. Stage II estrogen receptor positive cancer of the right breast treated as outlined above she remains free of any obvious recurrence now out a remarkable 13+ years  #2. Essential hypertension   CC:. Dr. Colin Broach; Dr. Delma Post, MD 4/2/20133:35 PM

## 2011-06-05 NOTE — Telephone Encounter (Signed)
APPTS MADE AND PRINTED FOR PT AOM °

## 2011-08-27 ENCOUNTER — Other Ambulatory Visit: Payer: Self-pay | Admitting: Cardiology

## 2011-10-31 ENCOUNTER — Other Ambulatory Visit: Payer: Self-pay | Admitting: Gynecology

## 2011-12-06 ENCOUNTER — Encounter: Payer: Self-pay | Admitting: Gynecology

## 2012-04-09 ENCOUNTER — Encounter (HOSPITAL_COMMUNITY): Payer: Self-pay

## 2012-04-09 ENCOUNTER — Emergency Department (HOSPITAL_COMMUNITY): Payer: Medicare Other

## 2012-04-09 ENCOUNTER — Inpatient Hospital Stay (HOSPITAL_COMMUNITY)
Admission: EM | Admit: 2012-04-09 | Discharge: 2012-04-12 | DRG: 392 | Disposition: A | Discharge status: Home / Self Care | Payer: Medicare Other | Attending: Cardiology | Admitting: Cardiology

## 2012-04-09 DIAGNOSIS — I4891 Unspecified atrial fibrillation: Secondary | ICD-10-CM | POA: Diagnosis present

## 2012-04-09 DIAGNOSIS — M949 Disorder of cartilage, unspecified: Secondary | ICD-10-CM | POA: Diagnosis present

## 2012-04-09 DIAGNOSIS — M899 Disorder of bone, unspecified: Secondary | ICD-10-CM | POA: Diagnosis present

## 2012-04-09 DIAGNOSIS — K529 Noninfective gastroenteritis and colitis, unspecified: Secondary | ICD-10-CM

## 2012-04-09 DIAGNOSIS — E7800 Pure hypercholesterolemia, unspecified: Secondary | ICD-10-CM | POA: Diagnosis present

## 2012-04-09 DIAGNOSIS — K219 Gastro-esophageal reflux disease without esophagitis: Secondary | ICD-10-CM | POA: Diagnosis present

## 2012-04-09 DIAGNOSIS — I10 Essential (primary) hypertension: Secondary | ICD-10-CM | POA: Diagnosis present

## 2012-04-09 DIAGNOSIS — K5289 Other specified noninfective gastroenteritis and colitis: Principal | ICD-10-CM | POA: Diagnosis present

## 2012-04-09 DIAGNOSIS — D649 Anemia, unspecified: Secondary | ICD-10-CM | POA: Diagnosis present

## 2012-04-09 DIAGNOSIS — Z853 Personal history of malignant neoplasm of breast: Secondary | ICD-10-CM

## 2012-04-09 LAB — URINALYSIS, MICROSCOPIC ONLY
Glucose, UA: NEGATIVE mg/dL
Ketones, ur: NEGATIVE mg/dL
Leukocytes, UA: NEGATIVE
Protein, ur: NEGATIVE mg/dL
Urobilinogen, UA: 0.2 mg/dL (ref 0.0–1.0)

## 2012-04-09 LAB — CBC WITH DIFFERENTIAL/PLATELET
Basophils Absolute: 0 10*3/uL (ref 0.0–0.1)
Basophils Relative: 0 % (ref 0–1)
Eosinophils Absolute: 0 10*3/uL (ref 0.0–0.7)
Eosinophils Relative: 0 % (ref 0–5)
HCT: 35 % — ABNORMAL LOW (ref 36.0–46.0)
Hemoglobin: 12 g/dL (ref 12.0–15.0)
Hemoglobin: 11.4 g/dL — ABNORMAL LOW (ref 12.0–15.0)
Lymphocytes Relative: 5 % — ABNORMAL LOW (ref 12–46)
MCH: 31.2 pg (ref 26.0–34.0)
MCHC: 34.3 g/dL (ref 30.0–36.0)
MCV: 91.4 fL (ref 78.0–100.0)
Monocytes Absolute: 0.5 10*3/uL (ref 0.1–1.0)
Monocytes Relative: 4 % (ref 3–12)
Monocytes Relative: 7 % (ref 3–12)
Neutro Abs: 13.3 10*3/uL — ABNORMAL HIGH (ref 1.7–7.7)
Neutrophils Relative %: 86 % — ABNORMAL HIGH (ref 43–77)
RDW: 12.5 % (ref 11.5–15.5)
RDW: 12.7 % (ref 11.5–15.5)
WBC: 14.6 10*3/uL — ABNORMAL HIGH (ref 4.0–10.5)

## 2012-04-09 LAB — HEPARIN LEVEL (UNFRACTIONATED): Heparin Unfractionated: 0.42 IU/mL (ref 0.30–0.70)

## 2012-04-09 LAB — COMPREHENSIVE METABOLIC PANEL
ALT: 11 U/L (ref 0–35)
AST: 21 U/L (ref 0–37)
BUN: 24 mg/dL — ABNORMAL HIGH (ref 6–23)
BUN: 18 mg/dL (ref 6–23)
CO2: 25 mEq/L (ref 19–32)
Calcium: 9.9 mg/dL (ref 8.4–10.5)
Calcium: 9.5 mg/dL (ref 8.4–10.5)
Chloride: 101 mEq/L (ref 96–112)
Creatinine, Ser: 1.1 mg/dL (ref 0.50–1.10)
GFR calc Af Amer: 55 mL/min — ABNORMAL LOW (ref >90)
GFR calc Af Amer: 69 mL/min — ABNORMAL LOW (ref >90)
GFR calc non Af Amer: 48 mL/min — ABNORMAL LOW (ref >90)
Glucose, Bld: 166 mg/dL — ABNORMAL HIGH (ref 70–99)
Glucose, Bld: 127 mg/dL — ABNORMAL HIGH (ref 70–99)
Sodium: 137 mEq/L (ref 135–145)
Total Bilirubin: 0.3 mg/dL (ref 0.3–1.2)
Total Protein: 7.4 g/dL (ref 6.0–8.3)

## 2012-04-09 LAB — PROTIME-INR
INR: 1.02 (ref 0.00–1.49)
Prothrombin Time: 13.3 seconds (ref 11.6–15.2)

## 2012-04-09 LAB — LIPASE, BLOOD: Lipase: 17 U/L (ref 11–59)

## 2012-04-09 LAB — TROPONIN I
Troponin I: <0.3 ng/mL (ref <0.30)
Troponin I: <0.3 ng/mL (ref <0.30)
Troponin I: <0.3 ng/mL (ref <0.30)

## 2012-04-09 LAB — HEMOGLOBIN A1C: Hgb A1c MFr Bld: 6.1 % — ABNORMAL HIGH (ref <5.7)

## 2012-04-09 MED ORDER — ASPIRIN 81 MG PO CHEW: 81.0000 mg | CHEWABLE_TABLET | Freq: Every day | ORAL | Status: DC

## 2012-04-09 MED ORDER — ATORVASTATIN CALCIUM 20 MG PO TABS
20.0000 mg | ORAL_TABLET | Freq: Every day | ORAL | Status: DC
  Administered 2012-04-09 – 2012-04-11 (×3): 20 mg via ORAL
  Filled 2012-04-09 (×4): qty 1

## 2012-04-09 MED ORDER — ONDANSETRON HCL 4 MG/2ML IJ SOLN: 4.0000 mg | Freq: Three times a day (TID) | INTRAMUSCULAR | Status: AC | PRN

## 2012-04-09 MED ORDER — CIPROFLOXACIN IN D5W 400 MG/200ML IV SOLN
400.0000 mg | Freq: Two times a day (BID) | INTRAVENOUS | Status: DC
  Administered 2012-04-09 – 2012-04-11 (×4): 400 mg via INTRAVENOUS
  Filled 2012-04-09 (×6): qty 200

## 2012-04-09 MED ORDER — FENTANYL CITRATE 0.05 MG/ML IJ SOLN
25.0000 ug | INTRAMUSCULAR | Status: DC | PRN
  Administered 2012-04-09 (×2): 25 ug via INTRAVENOUS
  Filled 2012-04-09 (×2): qty 2

## 2012-04-09 MED ORDER — CIPROFLOXACIN IN D5W 400 MG/200ML IV SOLN
400.0000 mg | Freq: Two times a day (BID) | INTRAVENOUS | Status: DC
  Filled 2012-04-09 (×2): qty 200

## 2012-04-09 MED ORDER — DILTIAZEM HCL 100 MG IV SOLR
5.0000 mg/h | INTRAVENOUS | Status: DC
  Administered 2012-04-09: 5 mg/h via INTRAVENOUS
  Filled 2012-04-09: qty 100

## 2012-04-09 MED ORDER — METRONIDAZOLE IN NACL 5-0.79 MG/ML-% IV SOLN
500.0000 mg | Freq: Once | INTRAVENOUS | Status: AC
  Administered 2012-04-09: 500 mg via INTRAVENOUS
  Filled 2012-04-09: qty 100

## 2012-04-09 MED ORDER — ASPIRIN 81 MG PO TABS: 81.0000 mg | ORAL_TABLET | Freq: Every day | ORAL | Status: DC

## 2012-04-09 MED ORDER — SODIUM CHLORIDE 0.9 % IV SOLN
INTRAVENOUS | Status: DC
  Administered 2012-04-09 – 2012-04-10 (×2): via INTRAVENOUS
  Administered 2012-04-10: 20 mL/h via INTRAVENOUS

## 2012-04-09 MED ORDER — METRONIDAZOLE IN NACL 5-0.79 MG/ML-% IV SOLN
500.0000 mg | Freq: Three times a day (TID) | INTRAVENOUS | Status: DC
  Filled 2012-04-09 (×2): qty 100

## 2012-04-09 MED ORDER — ACETAMINOPHEN 325 MG PO TABS: 650.0000 mg | ORAL_TABLET | ORAL | Status: DC | PRN

## 2012-04-09 MED ORDER — HYDROMORPHONE HCL PF 1 MG/ML IJ SOLN
0.5000 mg | INTRAMUSCULAR | Status: AC | PRN
  Administered 2012-04-09: 0.5 mg via INTRAVENOUS
  Filled 2012-04-09: qty 1

## 2012-04-09 MED ORDER — ONDANSETRON HCL 4 MG/2ML IJ SOLN
4.0000 mg | Freq: Once | INTRAMUSCULAR | Status: AC
  Administered 2012-04-09: 4 mg via INTRAVENOUS
  Filled 2012-04-09: qty 2

## 2012-04-09 MED ORDER — AMIODARONE HCL 200 MG PO TABS
400.0000 mg | ORAL_TABLET | Freq: Two times a day (BID) | ORAL | Status: DC
  Administered 2012-04-09 – 2012-04-12 (×7): 400 mg via ORAL
  Filled 2012-04-09 (×8): qty 2

## 2012-04-09 MED ORDER — ONDANSETRON HCL 4 MG/2ML IJ SOLN: 4.0000 mg | Freq: Four times a day (QID) | INTRAMUSCULAR | Status: DC | PRN

## 2012-04-09 MED ORDER — IOHEXOL 300 MG/ML  SOLN
100.0000 mL | Freq: Once | INTRAMUSCULAR | Status: AC | PRN
  Administered 2012-04-09: 100 mL via INTRAVENOUS

## 2012-04-09 MED ORDER — CIPROFLOXACIN IN D5W 400 MG/200ML IV SOLN
400.0000 mg | Freq: Once | INTRAVENOUS | Status: AC
  Administered 2012-04-09: 400 mg via INTRAVENOUS
  Filled 2012-04-09: qty 200

## 2012-04-09 MED ORDER — ASPIRIN 81 MG PO CHEW
324.0000 mg | CHEWABLE_TABLET | ORAL | Status: AC
  Administered 2012-04-09: 324 mg via ORAL
  Filled 2012-04-09: qty 4

## 2012-04-09 MED ORDER — METRONIDAZOLE IN NACL 5-0.79 MG/ML-% IV SOLN
500.0000 mg | Freq: Three times a day (TID) | INTRAVENOUS | Status: DC
  Administered 2012-04-09 – 2012-04-11 (×7): 500 mg via INTRAVENOUS
  Filled 2012-04-09 (×10): qty 100

## 2012-04-09 MED ORDER — ASPIRIN EC 81 MG PO TBEC
81.0000 mg | DELAYED_RELEASE_TABLET | Freq: Every day | ORAL | Status: DC
  Administered 2012-04-10 – 2012-04-12 (×3): 81 mg via ORAL
  Filled 2012-04-09 (×3): qty 1

## 2012-04-09 MED ORDER — HEPARIN BOLUS VIA INFUSION
3000.0000 [IU] | Freq: Once | INTRAVENOUS | Status: AC
  Administered 2012-04-09: 3000 [IU] via INTRAVENOUS
  Filled 2012-04-09: qty 3000

## 2012-04-09 MED ORDER — ASPIRIN 300 MG RE SUPP
300.0000 mg | RECTAL | Status: AC
  Filled 2012-04-09: qty 1

## 2012-04-09 MED ORDER — LORAZEPAM 2 MG/ML IJ SOLN
0.5000 mg | Freq: Once | INTRAMUSCULAR | Status: AC
  Administered 2012-04-09: 0.5 mg via INTRAVENOUS
  Filled 2012-04-09: qty 1

## 2012-04-09 MED ORDER — IOHEXOL 300 MG/ML  SOLN
20.0000 mL | INTRAMUSCULAR | Status: AC
  Administered 2012-04-09: 50 mL via ORAL

## 2012-04-09 MED ORDER — HEPARIN (PORCINE) IN NACL 100-0.45 UNIT/ML-% IJ SOLN
1200.0000 [IU]/h | INTRAMUSCULAR | Status: DC
  Administered 2012-04-09 – 2012-04-10 (×2): 1000 [IU]/h via INTRAVENOUS
  Administered 2012-04-11: 1100 [IU]/h via INTRAVENOUS
  Administered 2012-04-12: 1200 [IU]/h via INTRAVENOUS
  Filled 2012-04-09 (×7): qty 250

## 2012-04-09 MED ORDER — DILTIAZEM LOAD VIA INFUSION
15.0000 mg | Freq: Once | INTRAVENOUS | Status: AC
  Administered 2012-04-09: 15 mg via INTRAVENOUS
  Filled 2012-04-09: qty 15

## 2012-04-09 MED ORDER — SODIUM CHLORIDE 0.9 % IV SOLN
INTRAVENOUS | Status: AC
  Administered 2012-04-09: 100 mL/h via INTRAVENOUS

## 2012-04-09 MED ORDER — DARIFENACIN HYDROBROMIDE ER 7.5 MG PO TB24
7.5000 mg | ORAL_TABLET | Freq: Every day | ORAL | Status: DC
  Administered 2012-04-09 – 2012-04-12 (×4): 7.5 mg via ORAL
  Filled 2012-04-09 (×4): qty 1

## 2012-04-09 NOTE — H&P (Signed)
Wanda Green is an 78 y.o. female.   Chief Complaint: Abdominal pain associated with nausea vomiting and diarrhea HPI: Patient is 78 year old female with past medical history significant for hypertension hypercholesteremia, history of colitis, history of CA of breast, osteopenia, came to the ER complaining of lower abdominal pain associated with nausea vomiting and diarrhea approximately 10-15 times started yesterday evening around 5 PM. Patient denies any bright red blood per rectum. Denies any fever chills. States she ate chicken salad and potato soup yesterday afternoon and was feeling fine before that. Patient denies any chest pain or shortness of breath. Denies any palpitation lightheadedness or syncope. Patient states she has been feeling weak and tired for last few weeks which she attributes to working. Patient denies any history of PND orthopnea leg swelling. Denies any recent weight loss or gain. Denies any thyroid problems in the past. Patient had CT of the abdomen suggestive of colitis.  Past Medical History  Diagnosis Date  . Breast cancer   . Hypertension   . Osteopenia   . Colitis, ischemic   . Elevated cholesterol     Past Surgical History  Procedure Date  . Bi lateral knee scope   . Breast surgery 2000    RIGHT BREAST LUMPECTOMY  . Tubal ligation     Family History  Problem Relation Age of Onset  . Hypertension Mother   . Heart failure Mother   . Hypertension Father   . Heart disease Father   . Hypertension Sister   . Uterine cancer Sister   . Hypertension Brother    Social History:  reports that she has never smoked. She does not have any smokeless tobacco history on file. She reports that she does not drink alcohol or use illicit drugs.  Allergies:  Allergies  Allergen Reactions  . Other     Per pt, anything with PROTEIN in it causes Nausea & Vomiting   . Oxycodone-Acetaminophen     Medications Prior to Admission  Medication Sig Dispense Refill  .  aspirin 81 MG tablet Take 81 mg by mouth daily.        Marland Kitchen diltiazem (TIAZAC) 360 MG 24 hr capsule Take 360 mg by mouth daily.        Marland Kitchen olmesartan (BENICAR) 40 MG tablet Take 40 mg by mouth daily.        . solifenacin (VESICARE) 5 MG tablet Take 5 mg by mouth daily.        Results for orders placed during the hospital encounter of 04/09/12 (from the past 48 hour(s))  CBC WITH DIFFERENTIAL     Status: Abnormal   Collection Time   04/09/12  1:06 AM      Component Value Range Comment   WBC 14.6 (*) 4.0 - 10.5 K/uL    RBC 3.83 (*) 3.87 - 5.11 MIL/uL    Hemoglobin 12.0  12.0 - 15.0 g/dL    HCT 13.0 (*) 86.5 - 46.0 %    MCV 91.4  78.0 - 100.0 fL    MCH 31.3  26.0 - 34.0 pg    MCHC 34.3  30.0 - 36.0 g/dL    RDW 78.4  69.6 - 29.5 %    Platelets 220  150 - 400 K/uL    Neutrophils Relative 91 (*) 43 - 77 %    Neutro Abs 13.3 (*) 1.7 - 7.7 K/uL    Lymphocytes Relative 5 (*) 12 - 46 %    Lymphs Abs 0.8  0.7 - 4.0  K/uL    Monocytes Relative 4  3 - 12 %    Monocytes Absolute 0.5  0.1 - 1.0 K/uL    Eosinophils Relative 0  0 - 5 %    Eosinophils Absolute 0.0  0.0 - 0.7 K/uL    Basophils Relative 0  0 - 1 %    Basophils Absolute 0.0  0.0 - 0.1 K/uL   COMPREHENSIVE METABOLIC PANEL     Status: Abnormal   Collection Time   04/09/12  1:06 AM      Component Value Range Comment   Sodium 137  135 - 145 mEq/L    Potassium 4.0  3.5 - 5.1 mEq/L    Chloride 101  96 - 112 mEq/L    CO2 25  19 - 32 mEq/L    Glucose, Bld 166 (*) 70 - 99 mg/dL    BUN 24 (*) 6 - 23 mg/dL    Creatinine, Ser 1.61  0.50 - 1.10 mg/dL    Calcium 9.9  8.4 - 09.6 mg/dL    Total Protein 8.0  6.0 - 8.3 g/dL    Albumin 4.4  3.5 - 5.2 g/dL    AST 21  0 - 37 U/L    ALT 13  0 - 35 U/L    Alkaline Phosphatase 58  39 - 117 U/L    Total Bilirubin 0.3  0.3 - 1.2 mg/dL    GFR calc non Af Amer 48 (*) >90 mL/min    GFR calc Af Amer 55 (*) >90 mL/min   LIPASE, BLOOD     Status: Normal   Collection Time   04/09/12  1:06 AM      Component Value  Range Comment   Lipase 17  11 - 59 U/L   URINALYSIS, MICROSCOPIC ONLY     Status: Abnormal   Collection Time   04/09/12  2:29 AM      Component Value Range Comment   Color, Urine YELLOW  YELLOW    APPearance CLEAR  CLEAR    Specific Gravity, Urine 1.021  1.005 - 1.030    pH 5.0  5.0 - 8.0    Glucose, UA NEGATIVE  NEGATIVE mg/dL    Hgb urine dipstick NEGATIVE  NEGATIVE    Bilirubin Urine NEGATIVE  NEGATIVE    Ketones, ur NEGATIVE  NEGATIVE mg/dL    Protein, ur NEGATIVE  NEGATIVE mg/dL    Urobilinogen, UA 0.2  0.0 - 1.0 mg/dL    Nitrite NEGATIVE  NEGATIVE    Leukocytes, UA NEGATIVE  NEGATIVE    WBC, UA 0-2  <3 WBC/hpf    RBC / HPF 0-2  <3 RBC/hpf    Bacteria, UA RARE  RARE    Squamous Epithelial / LPF RARE  RARE    Casts HYALINE CASTS (*) NEGATIVE    Ct Abdomen Pelvis W Contrast  04/09/2012  *RADIOLOGY REPORT*  Clinical Data: Acute left lower quadrant abdominal pain, nausea and vomiting.  CT ABDOMEN AND PELVIS WITH CONTRAST  Technique:  Multidetector CT imaging of the abdomen and pelvis was performed following the standard protocol during bolus administration of intravenous contrast.  Contrast: OMNIPAQUE IOHEXOL 300 MG/ML  SOLN  Comparison: 01/05/2011.  Findings: Slight fibrosis in the lung bases.  Small esophageal hiatal hernia.  The liver, spleen, gallbladder, pancreas, adrenal glands, abdominal aorta, and retroperitoneal lymph nodes are unremarkable. Parapelvic cysts in the kidneys.  No solid mass or hydronephrosis. The stomach and small bowel are decompressed.  There is prominent  wall thickening and edema of the cecum and ascending colon with pericolonic infiltration.  Small amount of fluid around the colon and in the right lower quadrant.  This is most suggestive of an inflammatory or infectious colitis.  This could represent Yersinia, typhlitis, or TB.  Extension of inflammatory changes from cecal diverticulitis or appendicitis are not excluded.  The appendix itself is not identified.  Crohn disease is not excluded. Neoplasm is not excluded.  The remainder the colon is stool-filled without significant wall thickening. Changes appear to have developed since the previous study.  No free air or free fluid in the abdomen.  Pelvis:  The uterus and adnexal structures are not enlarged.  No bladder wall thickening.  No free or loculated pelvic fluid collections.  No evidence of diverticulitis.  No significant pelvic lymphadenopathy.  Degenerative changes in the lumbar spine.  IMPRESSION: Prominent colonic wall thickening and edema involving the cecum and ascending colon with pericolonic stranding and fluid collections. Changes most suggestive of infectious or inflammatory colitis. Cecal diverticulitis or appendicitis and tumor are not excluded.   Original Report Authenticated By: Burman Nieves, M.D.     Review of Systems  Constitutional: Negative for fever, chills and weight loss.  HENT: Negative for hearing loss.   Eyes: Negative for blurred vision and double vision.  Respiratory: Negative for cough, hemoptysis, sputum production and shortness of breath.   Cardiovascular: Negative for chest pain, palpitations, orthopnea, claudication and leg swelling.  Gastrointestinal: Positive for nausea, vomiting, abdominal pain and diarrhea. Negative for constipation, blood in stool and melena.  Genitourinary: Negative for dysuria, urgency and frequency.  Neurological: Negative for dizziness and headaches.    Blood pressure 114/70, pulse 113, temperature 98.3 F (36.8 C), temperature source Oral, resp. rate 19, height 5\' 3"  (1.6 m), weight 74.5 kg (164 lb 3.9 oz), SpO2 91.00%. Physical Exam  Constitutional: She is oriented to person, place, and time. She appears well-developed and well-nourished.  HENT:  Head: Normocephalic and atraumatic.  Eyes: Conjunctivae normal are normal. Pupils are equal, round, and reactive to light. Left eye exhibits no discharge. No scleral icterus.  Neck: Normal  range of motion. Neck supple. No JVD present. No tracheal deviation present. No thyromegaly present.  Cardiovascular:       Irregularly irregular tachycardic S1 and S2 soft there is soft systolic murmur no S3 gallop  Respiratory: Effort normal and breath sounds normal. No respiratory distress. She has no wheezes. She has no rales.  GI: Soft. Bowel sounds are normal. She exhibits no distension. There is tenderness (Mild tenderness in right and left lower quadrant no guarding or rebound tenderness).  Musculoskeletal: She exhibits no edema and no tenderness.  Neurological: She is alert and oriented to person, place, and time.     Assessment/Plan Abdominal pain associated with colitis rule out ischemic bowel New-onset A. fib rule out cardioembolic 2 bowel Hypertension Hypercholesteremia Osteopenia GERD History of CA of breast  History of colitis in the past Plan As per orders  Daire Okimoto N 04/09/2012, 8:21 AM

## 2012-04-09 NOTE — ED Provider Notes (Signed)
History     CSN: [id number]  Arrival date & time 04/09/12  0030   First MD Initiated Contact with Patient 04/09/12 0046      Chief Complaint  Patient presents with  . Abdominal Pain    (Consider location/radiation/quality/duration/timing/severity/associated sxs/prior treatment) Patient is a 78 y.o. female presenting with abdominal pain. The history is provided by the patient.  Abdominal Pain The primary symptoms of the illness include abdominal pain and vomiting. The primary symptoms of the illness do not include fever, shortness of breath or dysuria.  Symptoms associated with the illness do not include chills or back pain.   location left lower quadrant and suprapubic, no radiation, sharp in quality, constant since onset around 5 PM tonight. Pain is moderate to severe with associated nausea and vomiting. Has history of constipation with last bowel movement today. No blood in stools. Vomited about 14 times NB/NB. No known alleviating factors. No fevers. No trauma. No dysuria.  Past Medical History  Diagnosis Date  . Breast cancer   . Hypertension   . Osteopenia   . Colitis, ischemic   . Elevated cholesterol     Past Surgical History  Procedure Date  . Bi lateral knee scope   . Breast surgery 2000    RIGHT BREAST LUMPECTOMY  . Tubal ligation     Family History  Problem Relation Age of Onset  . Hypertension Mother   . Heart failure Mother   . Hypertension Father   . Heart disease Father   . Hypertension Sister   . Uterine cancer Sister   . Hypertension Brother     History  Substance Use Topics  . Smoking status: Never Smoker   . Smokeless tobacco: Not on file  . Alcohol Use: No    OB History    Grav Para Term Preterm Abortions TAB SAB Ect Mult Living   3 3 3       3       Review of Systems  Constitutional: Negative for fever and chills.  HENT: Negative for neck pain and neck stiffness.   Eyes: Negative for pain.  Respiratory: Negative for shortness of  breath.   Cardiovascular: Negative for chest pain.  Gastrointestinal: Positive for vomiting and abdominal pain.  Genitourinary: Negative for dysuria.  Musculoskeletal: Negative for back pain.  Skin: Negative for rash.  Neurological: Negative for headaches.  All other systems reviewed and are negative.    Allergies  Other and Oxycodone-acetaminophen  Home Medications   Current Outpatient Rx  Name  Route  Sig  Dispense  Refill  . ASPIRIN 81 MG PO TABS   Oral   Take 81 mg by mouth daily.           Marland Kitchen DILTIAZEM HCL ER BEADS 360 MG PO CP24   Oral   Take 360 mg by mouth daily.           Marland Kitchen OLMESARTAN MEDOXOMIL 40 MG PO TABS   Oral   Take 40 mg by mouth daily.           Marland Kitchen SOLIFENACIN SUCCINATE 5 MG PO TABS   Oral   Take 5 mg by mouth daily.           BP 112/81  Pulse 72  Temp 97.4 F (36.3 C) (Oral)  SpO2 99%  Physical Exam  Constitutional: She is oriented to person, place, and time. She appears well-developed and well-nourished.  HENT:  Head: Normocephalic and atraumatic.  Mouth/Throat: Oropharynx is clear and  moist.  Eyes: EOM are normal. Pupils are equal, round, and reactive to light. No scleral icterus.  Neck: Neck supple.  Cardiovascular: Normal rate, regular rhythm and intact distal pulses.   Pulmonary/Chest: Effort normal and breath sounds normal. No respiratory distress.  Abdominal: Soft. Bowel sounds are normal. She exhibits no distension and no mass.       Tender left lower quadrant without guarding or rebound.   Musculoskeletal: Normal range of motion. She exhibits no edema.  Neurological: She is alert and oriented to person, place, and time.  Skin: Skin is warm and dry.    ED Course  Procedures (including critical care time)  Results for orders placed during the hospital encounter of 04/09/12  CBC WITH DIFFERENTIAL      Component Value Range   WBC 14.6 (*) 4.0 - 10.5 K/uL   RBC 3.83 (*) 3.87 - 5.11 MIL/uL   Hemoglobin 12.0  12.0 - 15.0 g/dL    HCT 04.5 (*) 40.9 - 46.0 %   MCV 91.4  78.0 - 100.0 fL   MCH 31.3  26.0 - 34.0 pg   MCHC 34.3  30.0 - 36.0 g/dL   RDW 81.1  91.4 - 78.2 %   Platelets 220  150 - 400 K/uL   Neutrophils Relative 91 (*) 43 - 77 %   Neutro Abs 13.3 (*) 1.7 - 7.7 K/uL   Lymphocytes Relative 5 (*) 12 - 46 %   Lymphs Abs 0.8  0.7 - 4.0 K/uL   Monocytes Relative 4  3 - 12 %   Monocytes Absolute 0.5  0.1 - 1.0 K/uL   Eosinophils Relative 0  0 - 5 %   Eosinophils Absolute 0.0  0.0 - 0.7 K/uL   Basophils Relative 0  0 - 1 %   Basophils Absolute 0.0  0.0 - 0.1 K/uL  COMPREHENSIVE METABOLIC PANEL      Component Value Range   Sodium 137  135 - 145 mEq/L   Potassium 4.0  3.5 - 5.1 mEq/L   Chloride 101  96 - 112 mEq/L   CO2 25  19 - 32 mEq/L   Glucose, Bld 166 (*) 70 - 99 mg/dL   BUN 24 (*) 6 - 23 mg/dL   Creatinine, Ser 9.56  0.50 - 1.10 mg/dL   Calcium 9.9  8.4 - 21.3 mg/dL   Total Protein 8.0  6.0 - 8.3 g/dL   Albumin 4.4  3.5 - 5.2 g/dL   AST 21  0 - 37 U/L   ALT 13  0 - 35 U/L   Alkaline Phosphatase 58  39 - 117 U/L   Total Bilirubin 0.3  0.3 - 1.2 mg/dL   GFR calc non Af Amer 48 (*) >90 mL/min   GFR calc Af Amer 55 (*) >90 mL/min  LIPASE, BLOOD      Component Value Range   Lipase 17  11 - 59 U/L  URINALYSIS, MICROSCOPIC ONLY      Component Value Range   Color, Urine YELLOW  YELLOW   APPearance CLEAR  CLEAR   Specific Gravity, Urine 1.021  1.005 - 1.030   pH 5.0  5.0 - 8.0   Glucose, UA NEGATIVE  NEGATIVE mg/dL   Hgb urine dipstick NEGATIVE  NEGATIVE   Bilirubin Urine NEGATIVE  NEGATIVE   Ketones, ur NEGATIVE  NEGATIVE mg/dL   Protein, ur NEGATIVE  NEGATIVE mg/dL   Urobilinogen, UA 0.2  0.0 - 1.0 mg/dL   Nitrite NEGATIVE  NEGATIVE  Leukocytes, UA NEGATIVE  NEGATIVE   WBC, UA 0-2  <3 WBC/hpf   RBC / HPF 0-2  <3 RBC/hpf   Bacteria, UA RARE  RARE   Squamous Epithelial / LPF RARE  RARE   Casts HYALINE CASTS (*) NEGATIVE   Ct Abdomen Pelvis W Contrast  04/09/2012  *RADIOLOGY REPORT*   Clinical Data: Acute left lower quadrant abdominal pain, nausea and vomiting.  CT ABDOMEN AND PELVIS WITH CONTRAST  Technique:  Multidetector CT imaging of the abdomen and pelvis was performed following the standard protocol during bolus administration of intravenous contrast.  Contrast: OMNIPAQUE IOHEXOL 300 MG/ML  SOLN  Comparison: 01/05/2011.  Findings: Slight fibrosis in the lung bases.  Small esophageal hiatal hernia.  The liver, spleen, gallbladder, pancreas, adrenal glands, abdominal aorta, and retroperitoneal lymph nodes are unremarkable. Parapelvic cysts in the kidneys.  No solid mass or hydronephrosis. The stomach and small bowel are decompressed.  There is prominent wall thickening and edema of the cecum and ascending colon with pericolonic infiltration.  Small amount of fluid around the colon and in the right lower quadrant.  This is most suggestive of an inflammatory or infectious colitis.  This could represent Yersinia, typhlitis, or TB.  Extension of inflammatory changes from cecal diverticulitis or appendicitis are not excluded.  The appendix itself is not identified. Crohn disease is not excluded. Neoplasm is not excluded.  The remainder the colon is stool-filled without significant wall thickening. Changes appear to have developed since the previous study.  No free air or free fluid in the abdomen.  Pelvis:  The uterus and adnexal structures are not enlarged.  No bladder wall thickening.  No free or loculated pelvic fluid collections.  No evidence of diverticulitis.  No significant pelvic lymphadenopathy.  Degenerative changes in the lumbar spine.  IMPRESSION: Prominent colonic wall thickening and edema involving the cecum and ascending colon with pericolonic stranding and fluid collections. Changes most suggestive of infectious or inflammatory colitis. Cecal diverticulitis or appendicitis and tumor are not excluded.   Original Report Authenticated By: Burman Nieves, M.D.    IV fluids. IV  and Tinel. IV Zofran.  Repeat pain meds required. CT reviewed as above and ABx initiated, PCP consult   4:51 AM d/w Dr Sharyn Lull, plan admit.  MDM  ABD pain/ vomiting with colitis by CT reviewed as above. IVFs. IV narcotics. Labs reviewed - leukocytosis 14K. ABx and MED admit.         Sunnie Nielsen, MD 04/09/12 [phone]

## 2012-04-09 NOTE — Progress Notes (Signed)
ANTICOAGULATION CONSULT NOTE - Initial Consult  Pharmacy Consult for Heparin Indication: atrial fibrillation  Allergies  Allergen Reactions  . Other     Per pt, anything with PROTEIN in it causes Nausea & Vomiting   . Oxycodone-Acetaminophen     Patient Measurements: Height: 5\' 3"  (160 cm) Weight: 164 lb 3.9 oz (74.5 kg) IBW/kg (Calculated) : 52.4    Vital Signs: Temp: 98.3 F (36.8 C) (02/05 0702) Temp src: Oral (02/05 0702) BP: 114/70 mmHg (02/05 0702) Pulse Rate: 113  (02/05 0702)  Labs:  Basename 04/09/12 0106  HGB 12.0  HCT 35.0*  PLT 220  APTT --  LABPROT --  INR --  HEPARINUNFRC --  CREATININE 1.10  CKTOTAL --  CKMB --  TROPONINI --    Estimated Creatinine Clearance: 42.7 ml/min (by C-G formula based on Cr of 1.1).   Medical History: Past Medical History  Diagnosis Date  . Breast cancer   . Hypertension   . Osteopenia   . Colitis, ischemic   . Elevated cholesterol     Assessment: Patient is 78 year old female with past medical history significant for hypertension hypercholesteremia, history of colitis, history of CA of breast, osteopenia, came to the ER complaining of lower abdominal pain associated with nausea vomiting and diarrhea approximately 10-15 times started yesterday evening around 5 PM. Patient denies any bright red blood per rectum. Denies any fever chills. States she ate chicken salad and potato soup yesterday afternoon and was feeling fine before that.  With new onset Afib.  With mild renal insufficiency.  Goal of Therapy:  Heparin level 0.3-0.7 units/ml Monitor platelets by anticoagulation protocol: Yes   Plan:  1) Heparin 3000 units iv bolus x 1 2) Heparin drip at 1000 units / hr 3) Heparin level 6 hours after heparin begins 4) Daily heparin level, CBC  Thank you. Okey Regal, PharmD [phone]  04/09/2012,8:56 AM

## 2012-04-09 NOTE — Consult Note (Signed)
[facility] Consultation Note  Referring Provider: Dr. Rinaldo Cloud Primary Care Physician:  Robynn Pane, MD Primary Gastroenterologist:  Dr. Bernette Redbird  Reason for Consultation:  Abdominal pain, nausea, vomiting, diarrhea  HPI: Wanda Green is a 78 y.o. female admitted for, and whom we've been asked to see for, the above problems.  Patient was in static state of GI health until yesterday afternoon.  At that time, she had acute onset of lower abdominal pain, nausea, vomiting, non-bloody diarrhea.  Prior history of ischemic colitis, which felt different and is in different location than her current symptoms.  No fevers or sick contacts. No recent medications, including antibiotics.  Colonoscopy October 2012 by Dr. Matthias Hughs was normal, with caveat of only fair bowel preparation.   Today, less than 24 hours after onset of symptoms, she already feels better, but is not at her baseline yet.   Past Medical History  Diagnosis Date  . Breast cancer   . Hypertension   . Osteopenia   . Colitis, ischemic   . Elevated cholesterol     Past Surgical History  Procedure Date  . Bi lateral knee scope   . Breast surgery 2000    RIGHT BREAST LUMPECTOMY  . Tubal ligation     Prior to Admission medications   Medication Sig Start Date End Date Taking? Authorizing Provider  aspirin 81 MG tablet Take 81 mg by mouth daily.     Yes Historical Provider, MD  diltiazem (TIAZAC) 360 MG 24 hr capsule Take 360 mg by mouth daily.     Yes Historical Provider, MD  olmesartan (BENICAR) 40 MG tablet Take 40 mg by mouth daily.     Yes Historical Provider, MD  solifenacin (VESICARE) 5 MG tablet Take 5 mg by mouth daily.   Yes Historical Provider, MD    Current Facility-Administered Medications  Medication Dose Route Frequency Provider Last Rate Last Dose  . 0.9 %  sodium chloride infusion   Intravenous Continuous Sunnie Nielsen, MD 125 mL/hr at 04/09/12 1402    . 0.9 %  sodium chloride infusion    Intravenous STAT Sunnie Nielsen, MD      . acetaminophen (TYLENOL) tablet 650 mg  650 mg Oral Q4H PRN Robynn Pane, MD      . amiodarone (PACERONE) tablet 400 mg  400 mg Oral BID Robynn Pane, MD   400 mg at 04/09/12 1233  . aspirin EC tablet 81 mg  81 mg Oral Daily Robynn Pane, MD      . atorvastatin (LIPITOR) tablet 20 mg  20 mg Oral q1800 Robynn Pane, MD      . ciprofloxacin (CIPRO) IVPB 400 mg  400 mg Intravenous Q12H Robynn Pane, MD      . darifenacin (ENABLEX) 24 hr tablet 7.5 mg  7.5 mg Oral Daily Robynn Pane, MD   7.5 mg at 04/09/12 1121  . heparin ADULT infusion 100 units/mL (25000 units/250 mL)  1,000 Units/hr Intravenous Continuous Robynn Pane, MD 10 mL/hr at 04/09/12 1102 1,000 Units/hr at 04/09/12 1102  . HYDROmorphone (DILAUDID) injection 0.5 mg  0.5 mg Intravenous Q4H PRN Sunnie Nielsen, MD   0.5 mg at 04/09/12 1121  . metroNIDAZOLE (FLAGYL) IVPB 500 mg  500 mg Intravenous Q8H Robynn Pane, MD   500 mg at 04/09/12 1421  . ondansetron (ZOFRAN) injection 4 mg  4 mg Intravenous Q8H PRN Sunnie Nielsen, MD      . ondansetron [facility]) injection 4 mg  4 mg Intravenous Q6H PRN Robynn Pane, MD        Allergies as of 04/09/2012 - Review Complete 04/09/2012  Allergen Reaction Noted  . Other  04/09/2012  . Oxycodone-acetaminophen  08/10/2010    Family History  Problem Relation Age of Onset  . Hypertension Mother   . Heart failure Mother   . Hypertension Father   . Heart disease Father   . Hypertension Sister   . Uterine cancer Sister   . Hypertension Brother     History   Social History  . Marital Status: Widowed    Spouse Name: N/A    Number of Children: N/A  . Years of Education: N/A   Occupational History  . Not on file.   Social History Main Topics  . Smoking status: Never Smoker   . Smokeless tobacco: Not on file  . Alcohol Use: No  . Drug Use: No  . Sexually Active: No   Other Topics Concern  . Not on file   Social History Narrative   . No narrative on file    Review of Systems: ROS 04/08/12 by Dr. Sharyn Lull reviewed and I agree  Physical Exam: Vital signs in last 24 hours: Temp:  [97.4 F (36.3 C)-98.3 F (36.8 C)] 98.3 F (36.8 C) (02/05 0702) Pulse Rate:  [72-119] 97  (02/05 1022) Resp:  [19-20] 19  (02/05 0702) BP: (100-123)/(66-94) 107/66 mmHg (02/05 1022) SpO2:  [91 %-99 %] 91 % (02/05 0702) Weight:  [74.5 kg (164 lb 3.9 oz)] 74.5 kg (164 lb 3.9 oz) (02/05 0702)   General:   Alert,  Well-developed, well-nourished, pleasant and cooperative in NAD Head:  Normocephalic and atraumatic. Eyes:  Sclera clear, no icterus.   Conjunctiva pink. Ears:  Normal auditory acuity. Nose:  No deformity, discharge,  or lesions. Mouth:  No deformity or lesions.  Oropharynx pink but somewhat dry Neck:  Supple; no masses or thyromegaly. Lungs:  Clear throughout to auscultation.   No wheezes, crackles, or rhonchi. No acute distress. Heart:  Regular rate and rhythm; no murmurs, clicks, rubs,  or gallops. Abdomen:  Soft, mild lower abdominal tenderness without peritonitis. No masses, hepatosplenomegaly or hernias noted. Modestly hypoactive bowel sounds, without guarding, and without rebound.     Msk:  Symmetrical without gross deformities. Normal posture. Pulses:  Normal pulses noted. Extremities:  Without clubbing or edema. Neurologic:  Alert and  oriented x4;  Diffusely weak, otherwise grossly normal neurologically. Skin:  Intact without significant lesions or rashes. Psych:  Alert and cooperative. Normal mood and affect.   Lab Results:  [facility] 04/09/12 0948 04/09/12 0106  WBC 15.1* 14.6*  HGB 11.4* 12.0  HCT 33.2* 35.0*  PLT 213 220   BMET  Basename 04/09/12 0948 04/09/12 0106  NA 137 137  K 4.0 4.0  CL 101 101  CO2 26 25  GLUCOSE 127* 166*  BUN 18 24*  CREATININE 0.92 1.10  CALCIUM 9.5 9.9   LFT  Basename 04/09/12 0948  PROT 7.4  ALBUMIN 4.0  AST 15  ALT 11  ALKPHOS 53  BILITOT 0.4  BILIDIR --   IBILI --   PT/INR  Basename 04/09/12 0948  LABPROT 13.3  INR 1.02    Studies/Results: Ct Abdomen Pelvis W Contrast  04/09/2012  *RADIOLOGY REPORT*  Clinical Data: Acute left lower quadrant abdominal pain, nausea and vomiting.  CT ABDOMEN AND PELVIS WITH CONTRAST  Technique:  Multidetector CT imaging of the abdomen and pelvis was performed following the standard protocol  during bolus administration of intravenous contrast.  Contrast: OMNIPAQUE IOHEXOL 300 MG/ML  SOLN  Comparison: 01/05/2011.  Findings: Slight fibrosis in the lung bases.  Small esophageal hiatal hernia.  The liver, spleen, gallbladder, pancreas, adrenal glands, abdominal aorta, and retroperitoneal lymph nodes are unremarkable. Parapelvic cysts in the kidneys.  No solid mass or hydronephrosis. The stomach and small bowel are decompressed.  There is prominent wall thickening and edema of the cecum and ascending colon with pericolonic infiltration.  Small amount of fluid around the colon and in the right lower quadrant.  This is most suggestive of an inflammatory or infectious colitis.  This could represent Yersinia, typhlitis, or TB.  Extension of inflammatory changes from cecal diverticulitis or appendicitis are not excluded.  The appendix itself is not identified. Crohn disease is not excluded. Neoplasm is not excluded.  The remainder the colon is stool-filled without significant wall thickening. Changes appear to have developed since the previous study.  No free air or free fluid in the abdomen.  Pelvis:  The uterus and adnexal structures are not enlarged.  No bladder wall thickening.  No free or loculated pelvic fluid collections.  No evidence of diverticulitis.  No significant pelvic lymphadenopathy.  Degenerative changes in the lumbar spine.  IMPRESSION: Prominent colonic wall thickening and edema involving the cecum and ascending colon with pericolonic stranding and fluid collections. Changes most suggestive of infectious or  inflammatory colitis. Cecal diverticulitis or appendicitis and tumor are not excluded.   Original Report Authenticated By: Burman Nieves, M.D.     Impression:  1.  Abdominal pain.  Improving. 2.  Nausea, vomiting, diarrhea.  Improving. 3.  Abnormal CT abd/pelvis (thickening of proximal colon and cecum).  Overall constellation of findings most suspicious for acute infectious colitis.  Ischemic colitis is another possibility, albeit in absence of bleeding less likely.  Plan:  1.  Clear liquid diet, advance slowly as tolerated. 2.  Stool studies. 3.  Supportive care with antiemetics, IVF, etc. 4.  Hold off on colonoscopy unless patient doesn't continue to improve with medical management. 5.  Will follow; thank you for the consult.   LOS: 0 days   Ronetta Molla M  04/09/2012, 2:34 PM

## 2012-04-09 NOTE — ED Notes (Signed)
Patient presents with acute lower abd pain, nausea and vomiting. Onset around 1700 this evening after eating out earlier today around 1300. No CP or SOB

## 2012-04-09 NOTE — Progress Notes (Signed)
ANTICOAGULATION CONSULT NOTE - Follow up Consult  Pharmacy Consult for Heparin Indication: atrial fibrillation  Allergies  Allergen Reactions  . Other     Per pt, anything with PROTEIN in it causes Nausea & Vomiting   . Oxycodone-Acetaminophen     Patient Measurements: Height: 5\' 3"  (160 cm) Weight: 164 lb 3.9 oz (74.5 kg) IBW/kg (Calculated) : 52.4    Vital Signs: Temp: 99.4 F (37.4 C) (02/05 1540) Temp src: Oral (02/05 1500) BP: 107/64 mmHg (02/05 1500) Pulse Rate: 100  (02/05 1500)  Labs:  Basename 04/09/12 1625 04/09/12 1624 04/09/12 0948 04/09/12 0106  HGB -- -- 11.4* 12.0  HCT -- -- 33.2* 35.0*  PLT -- -- 213 220  APTT -- -- 28 --  LABPROT -- -- 13.3 --  INR -- -- 1.02 --  HEPARINUNFRC -- 0.42 -- --  CREATININE -- -- 0.92 1.10  CKTOTAL -- -- -- --  CKMB -- -- -- --  TROPONINI <0.30 -- <0.30 --    Estimated Creatinine Clearance: 51 ml/min (by C-G formula based on Cr of 0.92).   Medical History: Past Medical History  Diagnosis Date  . Breast cancer   . Hypertension   . Osteopenia   . Colitis, ischemic   . Elevated cholesterol     Assessment: Patient is 78 year old female with past medical history significant for hypertension hypercholesteremia, history of colitis, history of CA of breast, osteopenia, admitted 04/09/2012  Via ER complaining of lower abdominal pain associated with nausea vomiting and diarrhea approximately 10-15 times.   Patient noted to have new onset Afib.  Pharmacy consulted to dose heparin.  Anticoagulation: Afib, heparin infusing without problems at 1000 units/hr, heparin level at goal,  no bleeding noted, patient has no questions or complaints.    Goal of Therapy:  Heparin level 0.3-0.7 units/ml Monitor platelets by anticoagulation protocol: Yes   Plan:  1) Continue Heparin drip at 1000 units / hr 2) Confirm with am labs then daily heparin level, CBC   Thank you for allowing pharmacy to be a part of this patients care  team.  Lovenia Kim Pharm.D., BCPS Clinical Pharmacist 04/09/2012 6:03 PM Pager: [phone] Phone: [phone]

## 2012-04-09 NOTE — Progress Notes (Signed)
EKG done this am to confirm afib, cardizem drip started and bolus given. Notified by monitor tech that pt had converted to NSR before drip had started. Drip stopped, confirmed rhythm  at monitors to now be in NSR/ST, rate 97-102. Dr Particia Jasper notified. Orders received, will plan po amiodarone and continue with heparin IV. (cardizem will remain off).

## 2012-04-09 NOTE — ED Notes (Signed)
Patient transported to CT 

## 2012-04-09 NOTE — ED Notes (Signed)
CT called with reports that the pt. Was too anxious about getting into CT scanner. MD made aware; Ativan administered in CT

## 2012-04-10 LAB — CBC
Hemoglobin: 10.3 g/dL — ABNORMAL LOW (ref 12.0–15.0)
MCH: 32 pg (ref 26.0–34.0)
RBC: 3.22 MIL/uL — ABNORMAL LOW (ref 3.87–5.11)

## 2012-04-10 NOTE — Progress Notes (Signed)
Subjective: Diarrhea resolved. Still having lower abdominal crampy pain. Is not appreciably hungry.  Objective: Vital signs in last 24 hours: Temp:  [99.1 F (37.3 C)-100.5 F (38.1 C)] 99.2 F (37.3 C) (02/06 0642) Pulse Rate:  [95-100] 95  (02/06 0642) Resp:  [16-18] 17  (02/06 0642) BP: (107-127)/(52-78) 127/78 mmHg (02/06 0642) SpO2:  [90 %-96 %] 92 % (02/06 0642) Weight change:  Last BM Date: 04/08/12  PE: GEN:  NAD ABD:  Soft, mild generalized lower abdominal tenderness; hypoactive but present bowel sounds; no peritonitis  Lab Results: Stool studies pending  CBC    Component Value Date/Time   WBC 13.7* 04/10/2012 0535   WBC 4.7 05/29/2011 1124   RBC 3.22* 04/10/2012 0535   RBC 3.73 05/29/2011 1124   HGB 10.3* 04/10/2012 0535   HGB 11.8 05/29/2011 1124   HCT 29.4* 04/10/2012 0535   HCT 35.0 05/29/2011 1124   PLT 169 04/10/2012 0535   PLT 217 05/29/2011 1124   MCV 91.3 04/10/2012 0535   MCV 93.7 05/29/2011 1124   MCH 32.0 04/10/2012 0535   MCH 31.5 05/29/2011 1124   MCHC 35.0 04/10/2012 0535   MCHC 33.7 05/29/2011 1124   RDW 12.7 04/10/2012 0535   RDW 13.5 05/29/2011 1124   LYMPHSABS 1.0 04/09/2012 0948   LYMPHSABS 1.2 05/29/2011 1124   MONOABS 1.1* 04/09/2012 0948   MONOABS 0.5 05/29/2011 1124   EOSABS 0.0 04/09/2012 0948   EOSABS 0.1 05/29/2011 1124   BASOSABS 0.0 04/09/2012 0948   BASOSABS 0.0 05/29/2011 1124   Assessment:  1.  Abdominal pain.  No significant change since yesterday. 2.  Colitis of proximal colon + cecum.  Suspect infectious colitis; ischemic colitis another consideration.  Plan:  1.  Awaiting stool studies. 2.  Stay on clear liquids only. 3.  If symptoms don't start to improve in the next day or two, will need to consider colonoscopy.  Patient's last colonoscopy was in October 2012 by Dr. Matthias Hughs, which had fair prep but otherwise normal. 4.  Will follow.   Freddy Jaksch 04/10/2012, 1:54 PM

## 2012-04-10 NOTE — Progress Notes (Signed)
  Echocardiogram 2D Echocardiogram has been performed.  Wanda Green 04/10/2012, 9:01 AM

## 2012-04-10 NOTE — Progress Notes (Signed)
Subjective:  Continues to complain of lower abdominal pain. Nausea vomiting and diarrhea resolved. Denies any urinary complaints. Denies any fever or chills. Converted spontaneously to sinus rhythm and remains in sinus rhythm started on amiodarone yesterday.  Objective:  Vital Signs in the last 24 hours: Temp:  [99.1 F (37.3 C)-100.5 F (38.1 C)] 99.2 F (37.3 C) (02/06 0642) Pulse Rate:  [95-100] 95  (02/06 0642) Resp:  [16-18] 17  (02/06 0642) BP: (107-127)/(52-78) 127/78 mmHg (02/06 0642) SpO2:  [90 %-96 %] 92 % (02/06 0642)  Intake/Output from previous day: 02/05 0701 - 02/06 0700 In: 4652.3 [P.O.:240; I.V.:3712.3; IV Piggyback:700] Out: -  Intake/Output from this shift:    Physical Exam: Neck: no adenopathy, no carotid bruit, no JVD and supple, symmetrical, trachea midline Lungs: clear to auscultation bilaterally Heart: regular rate and rhythm, S1, S2 normal and Soft systolic murmur noted no S3 gallop Abdomen: Soft bowel sounds present mild tenderness in left lower quadrant and right lower quadrant noted no guarding Extremities: extremities normal, atraumatic, no cyanosis or edema  Lab Results:  Basename 04/10/12 0535 04/09/12 0948  WBC 13.7* 15.1*  HGB 10.3* 11.4*  PLT 169 213    Basename 04/09/12 0948 04/09/12 0106  NA 137 137  K 4.0 4.0  CL 101 101  CO2 26 25  GLUCOSE 127* 166*  BUN 18 24*  CREATININE 0.92 1.10    Basename 04/09/12 2025 04/09/12 1625  TROPONINI <0.30 <0.30   Hepatic Function Panel  Basename 04/09/12 0948  PROT 7.4  ALBUMIN 4.0  AST 15  ALT 11  ALKPHOS 53  BILITOT 0.4  BILIDIR --  IBILI --   No results found for this basename: CHOL in the last 72 hours No results found for this basename: PROTIME in the last 72 hours  Imaging: Imaging results have been reviewed and Ct Abdomen Pelvis W Contrast  04/09/2012  *RADIOLOGY REPORT*  Clinical Data: Acute left lower quadrant abdominal pain, nausea and vomiting.  CT ABDOMEN AND PELVIS  WITH CONTRAST  Technique:  Multidetector CT imaging of the abdomen and pelvis was performed following the standard protocol during bolus administration of intravenous contrast.  Contrast: OMNIPAQUE IOHEXOL 300 MG/ML  SOLN  Comparison: 01/05/2011.  Findings: Slight fibrosis in the lung bases.  Small esophageal hiatal hernia.  The liver, spleen, gallbladder, pancreas, adrenal glands, abdominal aorta, and retroperitoneal lymph nodes are unremarkable. Parapelvic cysts in the kidneys.  No solid mass or hydronephrosis. The stomach and small bowel are decompressed.  There is prominent wall thickening and edema of the cecum and ascending colon with pericolonic infiltration.  Small amount of fluid around the colon and in the right lower quadrant.  This is most suggestive of an inflammatory or infectious colitis.  This could represent Yersinia, typhlitis, or TB.  Extension of inflammatory changes from cecal diverticulitis or appendicitis are not excluded.  The appendix itself is not identified. Crohn disease is not excluded. Neoplasm is not excluded.  The remainder the colon is stool-filled without significant wall thickening. Changes appear to have developed since the previous study.  No free air or free fluid in the abdomen.  Pelvis:  The uterus and adnexal structures are not enlarged.  No bladder wall thickening.  No free or loculated pelvic fluid collections.  No evidence of diverticulitis.  No significant pelvic lymphadenopathy.  Degenerative changes in the lumbar spine.  IMPRESSION: Prominent colonic wall thickening and edema involving the cecum and ascending colon with pericolonic stranding and fluid collections. Changes most  suggestive of infectious or inflammatory colitis. Cecal diverticulitis or appendicitis and tumor are not excluded.   Original Report Authenticated By: Burman Nieves, M.D.     Cardiac Studies:  Assessment/Plan:  Resolving colitis Status post paroxysmal atrial fibrillation Acute on  chronic anemia most likely secondary to hydration Hypertension Hypercholesteremia History of CA of breast Plan  Check stool for occult blood  Check serum amylase lipase as it was not done yesterday Decrease IV fluids to [facility] Check labs in a.m. Check 2-D echo Advance diet as per GI Will switch heparin to Eliquis  tomorrow if hemoglobin remained stable  LOS: 1 day    Wanda Green N 04/10/2012, 9:37 AM

## 2012-04-10 NOTE — Progress Notes (Addendum)
ANTICOAGULATION CONSULT NOTE - Follow Up Consult  Pharmacy Consult for heparin Indication: atrial fibrillation  Labs:  Basename 04/10/12 0535 04/09/12 2025 04/09/12 1625 04/09/12 1624 04/09/12 0948 04/09/12 0106  HGB 10.3* -- -- -- 11.4* --  HCT 29.4* -- -- -- 33.2* 35.0*  PLT 169 -- -- -- 213 220  APTT -- -- -- -- 28 --  LABPROT -- -- -- -- 13.3 --  INR -- -- -- -- 1.02 --  HEPARINUNFRC 0.28* -- -- 0.42 -- --  CREATININE -- -- -- -- 0.92 1.10  CKTOTAL -- -- -- -- -- --  CKMB -- -- -- -- -- --  TROPONINI -- <0.30 <0.30 -- <0.30 --    Assessment: 78yo female now slightly subtherapeutic on heparin after one level at goal.  Goal of Therapy:  Heparin level 0.3-0.7 units/ml   Plan:  Will increase heparin gtt by ~1 unit/kg/hr to 1100 units/hr Follow up am heparin level  Thank you. Okey Regal, PharmD [phone]  04/10/2012,6:18 AM

## 2012-04-11 LAB — BASIC METABOLIC PANEL
CO2: 25 mEq/L (ref 19–32)
Chloride: 105 mEq/L (ref 96–112)
Glucose, Bld: 107 mg/dL — ABNORMAL HIGH (ref 70–99)
Potassium: 3.1 mEq/L — ABNORMAL LOW (ref 3.5–5.1)
Sodium: 139 mEq/L (ref 135–145)

## 2012-04-11 LAB — HEPARIN LEVEL (UNFRACTIONATED): Heparin Unfractionated: 0.27 IU/mL — ABNORMAL LOW (ref 0.30–0.70)

## 2012-04-11 LAB — CBC
HCT: 29.8 % — ABNORMAL LOW (ref 36.0–46.0)
Hemoglobin: 10.1 g/dL — ABNORMAL LOW (ref 12.0–15.0)
MCHC: 33.9 g/dL (ref 30.0–36.0)
WBC: 12.9 10*3/uL — ABNORMAL HIGH (ref 4.0–10.5)

## 2012-04-11 MED ORDER — POTASSIUM CHLORIDE CRYS ER 20 MEQ PO TBCR
40.0000 meq | EXTENDED_RELEASE_TABLET | ORAL | Status: AC
  Administered 2012-04-11 (×2): 40 meq via ORAL
  Filled 2012-04-11 (×2): qty 2

## 2012-04-11 MED ORDER — CIPROFLOXACIN HCL 250 MG PO TABS
250.0000 mg | ORAL_TABLET | Freq: Two times a day (BID) | ORAL | Status: DC
  Filled 2012-04-11 (×2): qty 1

## 2012-04-11 MED ORDER — METRONIDAZOLE 500 MG PO TABS
500.0000 mg | ORAL_TABLET | Freq: Two times a day (BID) | ORAL | Status: DC
  Administered 2012-04-11 – 2012-04-12 (×2): 500 mg via ORAL
  Filled 2012-04-11 (×3): qty 1

## 2012-04-11 MED ORDER — CIPROFLOXACIN HCL 250 MG PO TABS
250.0000 mg | ORAL_TABLET | Freq: Two times a day (BID) | ORAL | Status: DC
  Administered 2012-04-11 – 2012-04-12 (×2): 250 mg via ORAL
  Filled 2012-04-11 (×4): qty 1

## 2012-04-11 MED ORDER — METRONIDAZOLE 500 MG PO TABS
500.0000 mg | ORAL_TABLET | Freq: Two times a day (BID) | ORAL | Status: DC
  Filled 2012-04-11: qty 1

## 2012-04-11 NOTE — Progress Notes (Signed)
Subjective: No abdominal pain. No diarrhea.  Wants to eat.  Objective: Vital signs in last 24 hours: Temp:  [98.4 F (36.9 C)-98.7 F (37.1 C)] 98.4 F (36.9 C) (02/07 1312) Pulse Rate:  [87-93] 87  (02/07 1312) Resp:  [16-20] 16  (02/07 1312) BP: (122-136)/(62-81) 127/78 mmHg (02/07 1312) SpO2:  [91 %-98 %] 98 % (02/07 1312) Weight change:  Last BM Date: 04/08/12  PE: GEN:  NAD ABD:  Soft, active bowel sounds.  Assessment:  1.  Abdominal pain, nausea, vomiting, diarrhea.  Resolved.  Suspect infectious colitis; ischemic colitis is another less likely consideration. 2.  Abnormal CT abdomen, cecal and proximal ascending colon thickening.  Plan:  1.  Advance diet. 2.  Transition to oral antibiotics, plan for 10 day total course. 3.  Saline lock IVF. 4.  Ambulate halls, OOBTC. 5.  If tolerates diet today without recurrence of abdominal pain and/or diarrhea, would consider discharge home tomorrow from GI standpoint. 6.  Will follow.  Thank you for the consult.   Freddy Jaksch 04/11/2012, 3:16 PM

## 2012-04-11 NOTE — Progress Notes (Signed)
Subjective:  Patient states abdominal pain has improved no BM yet. No nausea or vomiting. Will hold off to oral 10 A inhibitors for now and continue with heparin for now as patient may need colonoscopy  Objective:  Vital Signs in the last 24 hours: Temp:  [98.6 F (37 C)-98.7 F (37.1 C)] 98.6 F (37 C) (02/07 0450) Pulse Rate:  [87-93] 87  (02/07 0450) Resp:  [16-20] 18  (02/07 0450) BP: (122-136)/(62-81) 122/74 mmHg (02/07 0450) SpO2:  [91 %-97 %] 96 % (02/07 0450)  Intake/Output from previous day: 02/06 0701 - 02/07 0700 In: 1434.1 [P.O.:240; I.V.:394.1; IV Piggyback:800] Out: 200 [Urine:200] Intake/Output from this shift: Total I/O In: 660 [P.O.:660] Out: -   Physical Exam: Neck: no adenopathy, no carotid bruit, no JVD and supple, symmetrical, trachea midline Lungs: clear to auscultation bilaterally Heart: regular rate and rhythm, S1, S2 normal and Soft systolic murmur noted Abdomen: soft, non-tender; bowel sounds normal; no masses,  no organomegaly Extremities: extremities normal, atraumatic, no cyanosis or edema  Lab Results:  [facility] 04/11/12 0605 04/10/12 0535  WBC 12.9* 13.7*  HGB 10.1* 10.3*  PLT 171 169    Basename 04/11/12 0605 04/09/12 0948  NA 139 137  K 3.1* 4.0  CL 105 101  CO2 25 26  GLUCOSE 107* 127*  BUN 9 18  CREATININE 0.96 0.92    Basename 04/09/12 2025 04/09/12 1625  TROPONINI <0.30 <0.30   Hepatic Function Panel  Basename 04/09/12 0948  PROT 7.4  ALBUMIN 4.0  AST 15  ALT 11  ALKPHOS 53  BILITOT 0.4  BILIDIR --  IBILI --   No results found for this basename: CHOL in the last 72 hours No results found for this basename: PROTIME in the last 72 hours  Imaging: Imaging results have been reviewed and No results found.  Cardiac Studies:  Assessment/Plan:  Resolving colitis  Status post paroxysmal atrial fibrillation  Acute on chronic anemia most likely secondary to hydration  Hypertension  Hypercholesteremia Hypokalemia   History of CA of breast  Plan  Replace K. Advance diet per GI if no plans for colonoscopy Continue heparin for now Check labs in a.m.  LOS: 2 days    Luigi Stuckey N 04/11/2012, 12:54 PM

## 2012-04-11 NOTE — Progress Notes (Signed)
ANTICOAGULATION CONSULT NOTE - Follow Up Consult  Pharmacy Consult for Heparin Indication: atrial fibrillation  Allergies  Allergen Reactions  . Other     Per pt, anything with PROTEIN in it causes Nausea & Vomiting   . Oxycodone-Acetaminophen     Patient Measurements: Height: 5\' 3"  (160 cm) Weight: 164 lb 3.9 oz (74.5 kg) IBW/kg (Calculated) : 52.4  Heparin Dosing Weight:   Vital Signs: Temp: 98.6 F (37 C) (02/07 0450) Temp src: Oral (02/07 0450) BP: 122/74 mmHg (02/07 0450) Pulse Rate: 87  (02/07 0450)  Labs:  Basename 04/11/12 0605 04/10/12 0535 04/09/12 2025 04/09/12 1625 04/09/12 1624 04/09/12 0948 04/09/12 0106  HGB 10.1* 10.3* -- -- -- -- --  HCT 29.8* 29.4* -- -- -- 33.2* --  PLT 171 169 -- -- -- 213 --  APTT -- -- -- -- -- 28 --  LABPROT -- -- -- -- -- 13.3 --  INR -- -- -- -- -- 1.02 --  HEPARINUNFRC 0.27* 0.28* -- -- 0.42 -- --  CREATININE 0.96 -- -- -- -- 0.92 1.10  CKTOTAL -- -- -- -- -- -- --  CKMB -- -- -- -- -- -- --  TROPONINI -- -- <0.30 <0.30 -- <0.30 --    Estimated Creatinine Clearance: 48.9 ml/min (by C-G formula based on Cr of 0.96).   Medications:  Scheduled:    . amiodarone  400 mg Oral BID  . aspirin EC  81 mg Oral Daily  . atorvastatin  20 mg Oral q1800  . ciprofloxacin  400 mg Intravenous Q12H  . darifenacin  7.5 mg Oral Daily  . metronidazole  500 mg Intravenous Q8H    Assessment: 78yo female with AFib on Heparin with plan to start Apixaban today if Hg stable.  Heparin level is just below goal at 0.27 this AM, Hg & pltc are stable this AM.  No bleeding problems noted.    Goal of Therapy:  Heparin level 0.3-0.7 units/ml Monitor platelets by anticoagulation protocol: Yes   Plan:  1.  Increase Heparin to 1200 units/hr 2.  F/U Heparin level in AM 3.  F/U for initiation of Apixaban  Marisue Humble, PharmD Clinical Pharmacist [facility]

## 2012-04-12 LAB — BASIC METABOLIC PANEL
GFR calc Af Amer: 60 mL/min — ABNORMAL LOW (ref >90)
GFR calc non Af Amer: 52 mL/min — ABNORMAL LOW (ref >90)
Potassium: 3.8 mEq/L (ref 3.5–5.1)
Sodium: 138 mEq/L (ref 135–145)

## 2012-04-12 LAB — CBC
MCHC: 34.3 g/dL (ref 30.0–36.0)
RDW: 12.7 % (ref 11.5–15.5)

## 2012-04-12 LAB — HEPARIN LEVEL (UNFRACTIONATED): Heparin Unfractionated: 0.3 IU/mL (ref 0.30–0.70)

## 2012-04-12 MED ORDER — AMIODARONE HCL 200 MG PO TABS: 200.0000 mg | ORAL_TABLET | Freq: Two times a day (BID) | ORAL | Status: DC

## 2012-04-12 MED ORDER — APIXABAN 5 MG PO TABS
5.0000 mg | ORAL_TABLET | Freq: Two times a day (BID) | ORAL | Status: DC
  Administered 2012-04-12: 5 mg via ORAL
  Filled 2012-04-12 (×2): qty 1

## 2012-04-12 MED ORDER — APIXABAN 5 MG PO TABS: 5.0000 mg | ORAL_TABLET | Freq: Two times a day (BID) | ORAL | Status: DC

## 2012-04-12 MED ORDER — CIPROFLOXACIN HCL 250 MG PO TABS: 250.0000 mg | ORAL_TABLET | Freq: Two times a day (BID) | ORAL | Status: DC

## 2012-04-12 MED ORDER — METRONIDAZOLE 500 MG PO TABS: 500.0000 mg | ORAL_TABLET | Freq: Two times a day (BID) | ORAL | Status: DC

## 2012-04-12 MED ORDER — ATORVASTATIN CALCIUM 20 MG PO TABS: 20.0000 mg | ORAL_TABLET | Freq: Every day | ORAL | Status: DC

## 2012-04-12 NOTE — Progress Notes (Signed)
Pt discharged per MD order and protocol. Discharge instructions reviewed with patient and all questions answered. Pt aware of all follow up appointments and all prescriptions given to patient.

## 2012-04-12 NOTE — Discharge Summary (Signed)
NAMESTEPHANNIE, Wanda Green               ACCOUNT NO.:  [id number]  MEDICAL RECORD NO.:  [id number]  LOCATION:  2019                         FACILITY:  [facility]  PHYSICIAN:  Eduardo Osier. Sharyn Lull, M.D. DATE OF BIRTH:  02-01-1935  DATE OF ADMISSION:  04/09/2012 DATE OF DISCHARGE:  04/12/2012                              DISCHARGE SUMMARY   ADMITTING DIAGNOSES: 1. Abdominal pain associated with nausea, vomiting, diarrhea, rule out     colitis. 2. New onset atrial fibrillation, rule out cardioembolic     __________/ischemic bowel. 3. Hypertension. 4. Hypercholesteremia. 5. Gastroesophageal reflux disease. 6. History of cancer of the breast in the past. 7. Osteopenia. 8. History of colitis in the past.  DISCHARGE DIAGNOSES: 1. Resolving probably infectious colitis. 2. Status post atrial fibrillation with rapid ventricular response. 3. Hypertension. 4. Hypercholesteremia. 5. Gastroesophageal reflux disease. 6. History of cancer of breast. 7. Osteopenia.  DISCHARGE HOME MEDICATIONS: 1. Amiodarone 200 mg 1 tablet twice daily. 2. Apixaban 5 mg 1 tablet twice daily 3. Atorvastatin 20 mg 1 tablet daily. 4. Ciprofloxacin 250 mg 1 tablet twice daily. 5. Flagyl 500 mg 1 tablet twice daily. 6. Both antibiotics for 1 week, i.e., Cipro and Flagyl. 7. Benicar 40 mg 1 tablet daily. 8. VESIcare 5 mg daily.  The patient has been advised to stop aspirin     and diltiazem.  DIET:  Low salt, low cholesterol.  Advance diet slowly as tolerated.  FOLLOWUP:  Follow up with me in 1 week.  Follow up with GI as scheduled.  CONDITION AT DISCHARGE:  Stable.  BRIEF HISTORY AND HOSPITAL COURSE:  [name] is a 78 year old female with past medical history significant for hypertension, hypercholesteremia, history of colitis in the past, CA of breast, osteopenia.  She came to the ER complaining of lower abdominal pain associated with nausea, vomiting, and diarrhea approximately 10-15 times, started yesterday  evening around 5 p.m.  The patient denies any bright red blood per rectum.  Denies any fever, chills.  States she ate chicken salad and potato soup yesterday afternoon and was feeling fine before that.  The patient denies any chest pain or shortness of breath. Denies any palpitation, lightheadedness, or syncope.  The patient states she has been feeling weak and tired for last few weeks, which she attribute to working extra hours.  The patient denies any history of PND, orthopnea, or leg swelling.  Denies any recent weight loss or weight gain.  Denies any thyroid problems.  The patient had CT of the abdomen in the ER, which was suggestive of ascending colon and cecal colitis.  PHYSICAL EXAMINATION:  VITAL SIGNS:  Her blood pressure was 114/70, pulse was 113, irregularly irregular.  She was afebrile. HEENT:  Conjunctiva was pink. NECK:  Supple.  No JVD. LUNGS:  Clear to auscultation without rhonchi or rales. CARDIOVASCULAR:  Irregularly irregular.  She was tachycardic.  S1, S2 was soft.  There was soft systolic murmur.  No S3 gallop. ABDOMEN:  Soft.  Bowel sounds were present.  She had mild tenderness in right lower and left lower quadrant.  There was no guarding or rebound tenderness. EXTREMITIES:  There was no clubbing, cyanosis, or edema. NEUROLOGIC:  Grossly intact.  LABORATORY DATA:  Sodium was 137, potassium 4.0, BUN 24, creatinine 1.10.  Blood sugar was 166, repeat fasting sugar was 107.  Hemoglobin was 12, hematocrit 35.0, white count of 14.6 with left shift.  Blood cultures are negative so far.  CT of the abdomen showed prominent colonic wall thickening and edema involving the cecum and ascending colon with pericolonic stranding and fluid collection suggestive of infectious colitis.  EKG showed atrial fibrillation with RVR, heart rate in 125s.  There were nonspecific ST-T wave changes.  Repeat EKG showed normal sinus rhythm.  AFib was resolved.  BRIEF HOSPITAL COURSE:  The  patient was admitted to telemetry unit. Pancultures were obtained and was started on IV Cipro and Flagyl and also started on heparin.  The patient spontaneously converted into sinus rhythm after receiving bolus of IV Cardizem.  The patient subsequently was started on amiodarone and was continued on amiodarone.  The patient remained in sinus rhythm during the hospital stay.  GI consultation was obtained.  Stool cultures were sent which have been negative so far. The patient had colonoscopy last year which was negative.  The patient was felt not a candidate for repeat colonoscopy.  The patient clinically improved.  Diarrhea and abdominal pain has completely resolved.  IV antibiotics have been switched to p.o. Cipro and Flagyl which she is tolerating well.  We will switch her heparin to Eliquis p.o. 5 mg twice daily as above.  The patient will be discharged home on above medications and will be followed up in my office in 1 week and GI as scheduled.     Eduardo Osier. Sharyn Lull, M.D.     [facility]MED[facility]  D:  04/12/2012  T:  04/12/2012  Job:  [id number]

## 2012-04-12 NOTE — Discharge Summary (Signed)
  Discharge summary dictated on 04/12/2012 dictation number is [id number]

## 2012-04-12 NOTE — Progress Notes (Signed)
ANTICOAGULATION CONSULT NOTE - Follow Up Consult  Pharmacy Consult for Heparin Indication: atrial fibrillation  Allergies  Allergen Reactions  . Other     Per pt, anything with PROTEIN in it causes Nausea & Vomiting   . Oxycodone-Acetaminophen     Patient Measurements: Height: 5\' 3"  (160 cm) Weight: 164 lb 3.9 oz (74.5 kg) IBW/kg (Calculated) : 52.4 Heparin Dosing Weight:   Vital Signs: Temp: 98.7 F (37.1 C) (02/08 0528) Temp src: Oral (02/08 0528) BP: 143/85 mmHg (02/08 0528) Pulse Rate: 80 (02/08 0528)  Labs:  Recent Labs  04/09/12 0948  04/09/12 1625 04/09/12 2025 04/10/12 0535 04/11/12 0605 04/12/12 0545  HGB 11.4*  --   --   --  10.3* 10.1* 10.3*  HCT 33.2*  --   --   --  29.4* 29.8* 30.0*  PLT 213  --   --   --  169 171 199  APTT 28  --   --   --   --   --   --   LABPROT 13.3  --   --   --   --   --   --   INR 1.02  --   --   --   --   --   --   HEPARINUNFRC  --   < >  --   --  0.28* 0.27* 0.30  CREATININE 0.92  --   --   --   --  0.96 1.03  TROPONINI <0.30  --  <0.30 <0.30  --   --   --   < > = values in this interval not displayed.  Estimated Creatinine Clearance: 45.6 ml/min (by C-G formula based on Cr of 1.03).   Medications:  Scheduled:  . amiodarone  400 mg Oral BID  . aspirin EC  81 mg Oral Daily  . atorvastatin  20 mg Oral q1800  . ciprofloxacin  250 mg Oral BID  . darifenacin  7.5 mg Oral Daily  . metroNIDAZOLE  500 mg Oral Q12H  . [COMPLETED] potassium chloride  40 mEq Oral Q2H  . [DISCONTINUED] ciprofloxacin  400 mg Intravenous Q12H  . [DISCONTINUED] ciprofloxacin  250 mg Oral BID  . [DISCONTINUED] metronidazole  500 mg Intravenous Q8H  . [DISCONTINUED] metroNIDAZOLE  500 mg Oral Q12H    Assessment: 78yo female with AFib.  Heparin level thereapeutic at 0.3 this AM, Hg & pltc stable.  No bleeding problems noted.  Plan to start Apixaban pending need for colonoscopy; it does not appear this is planned per GI note from Friday afternoon.     Goal of Therapy:  Heparin level 0.3-0.7 units/ml Monitor platelets by anticoagulation protocol: Yes   Plan:  1.  Continue current Heparin rate 2.  F/U plans for Apixaban  Marisue Humble, PharmD Clinical Pharmacist [facility]

## 2012-04-15 LAB — CULTURE, BLOOD (ROUTINE X 2): Culture: NO GROWTH

## 2012-04-20 ENCOUNTER — Other Ambulatory Visit: Payer: Self-pay

## 2012-05-29 ENCOUNTER — Ambulatory Visit (INDEPENDENT_AMBULATORY_CARE_PROVIDER_SITE_OTHER): Payer: Self-pay | Admitting: Gynecology

## 2012-05-29 ENCOUNTER — Encounter: Payer: Self-pay | Admitting: Gynecology

## 2012-05-29 VITALS — BP 120/80 | Ht 63.0 in | Wt 162.0 lb

## 2012-05-29 DIAGNOSIS — N3281 Overactive bladder: Secondary | ICD-10-CM

## 2012-05-29 DIAGNOSIS — M8580 Other specified disorders of bone density and structure, unspecified site: Secondary | ICD-10-CM

## 2012-05-29 DIAGNOSIS — M899 Disorder of bone, unspecified: Secondary | ICD-10-CM

## 2012-05-29 DIAGNOSIS — N952 Postmenopausal atrophic vaginitis: Secondary | ICD-10-CM

## 2012-05-29 DIAGNOSIS — N318 Other neuromuscular dysfunction of bladder: Secondary | ICD-10-CM

## 2012-05-29 MED ORDER — SOLIFENACIN SUCCINATE 10 MG PO TABS: 10.0000 mg | ORAL_TABLET | Freq: Every day | ORAL | Status: DC

## 2012-05-29 NOTE — Patient Instructions (Signed)
Try a new dose of VESIcare. Call me for any issues. Followup in one year for annual exam.

## 2012-05-29 NOTE — Progress Notes (Signed)
LUCILIA YANNI 09/07/1934 [id number]        78 y.o.  [id number] for followup exam.  Several issues that are below  Past medical history,surgical history, medications, allergies, family history and social history were all reviewed and documented in the EPIC chart. ROS:  Was performed and pertinent positives and negatives are included in the history.  Exam: Kim assistant Filed Vitals:   05/29/12 1100  BP: 120/80  Height: 5\' 3"  (1.6 m)  Weight: 162 lb (73.483 kg)   General appearance  Normal Skin grossly normal Head/Neck normal with no cervical or supraclavicular adenopathy thyroid normal Lungs  clear Cardiac RR, without RMG Abdominal  soft, nontender, without masses, organomegaly or hernia Breasts  examined lying and sitting. Left without masses, retractions, discharge or axillary adenopathy. Right status post lumpectomy changes. No masses retractions discharge adenopathy Pelvic  Ext/BUS/vagina  normal with atrophic changes  Cervix  normal with atrophic changes  Uterus  axial, normal size, shape and contour, midline and mobile nontender   Adnexa  Without masses or tenderness    Anus and perineum  normal   Rectovaginal  normal sphincter tone without palpated masses or tenderness.    Assessment/Plan:  78 y.o. [id number] female for followup exam.   1. Detrusor instability. Patient's continues to urgency incontinence. Is on VESIcare 5 mg and has breakthrough episodes later in the day taking her medicine in the morning. We'll try VESIcare 10 mg and I gave her a 2 week sample with refill provided. Possible trial of Myrbetriq if this fails. Check UA today. 2. Breast cancer. Doing well. Mammogram 12/2011. Continue the mammography. SBE monthly reviewed. 3. Pap smear 2011. No Pap smear done today. History of cervical dysplasia H. 25 status post cone with all followup Pap smears normal. Reviewed current screening guidelines and we'll plan stop screaming and she is comfortable with  this. 4. Osteopenia. DEXA 01/2010 T score -1.9 FRAX 12%/2.5%. Repeat DEXA now. Increase calcium vitamin D reviewed. 5. Colonoscopy 2012. Recent admission to hospital for ischemic colitis doing well now. Actively following up with gastroenterology for this. 6. Health maintenance. The blood work done as it is all done through her primary physician's office who she sees on her regular basis. Follow up one year, sooner as needed.   Dara Lords MD, 11:41 AM 05/29/2012

## 2012-05-30 LAB — URINALYSIS W MICROSCOPIC + REFLEX CULTURE
Bilirubin Urine: NEGATIVE
Hgb urine dipstick: NEGATIVE
Leukocytes, UA: NEGATIVE
Protein, ur: NEGATIVE mg/dL
Squamous Epithelial / LPF: NONE SEEN
Urobilinogen, UA: 0.2 mg/dL (ref 0.0–1.0)

## 2012-06-03 ENCOUNTER — Other Ambulatory Visit (HOSPITAL_BASED_OUTPATIENT_CLINIC_OR_DEPARTMENT_OTHER): Payer: Medicare Other | Admitting: Lab

## 2012-06-03 DIAGNOSIS — C50919 Malignant neoplasm of unspecified site of unspecified female breast: Secondary | ICD-10-CM

## 2012-06-03 DIAGNOSIS — M810 Age-related osteoporosis without current pathological fracture: Secondary | ICD-10-CM

## 2012-06-03 HISTORY — DX: Age-related osteoporosis without current pathological fracture: M81.0

## 2012-06-03 LAB — CBC WITH DIFFERENTIAL/PLATELET
Basophils Absolute: 0 10*3/uL (ref 0.0–0.1)
Eosinophils Absolute: 0 10*3/uL (ref 0.0–0.5)
HGB: 10.8 g/dL — ABNORMAL LOW (ref 11.6–15.9)
MCV: 93.3 fL (ref 79.5–101.0)
MONO#: 0.4 10*3/uL (ref 0.1–0.9)
MONO%: 8.1 % (ref 0.0–14.0)
NEUT#: 3.6 10*3/uL (ref 1.5–6.5)
RDW: 13.6 % (ref 11.2–14.5)
WBC: 5.2 10*3/uL (ref 3.9–10.3)
lymph#: 1.1 10*3/uL (ref 0.9–3.3)

## 2012-06-03 LAB — COMPREHENSIVE METABOLIC PANEL (CC13)
ALT: 14 U/L (ref 0–55)
Albumin: 4 g/dL (ref 3.5–5.0)
CO2: 25 mEq/L (ref 22–29)
Calcium: 9.4 mg/dL (ref 8.4–10.4)
Chloride: 105 mEq/L (ref 98–107)
Glucose: 115 mg/dl — ABNORMAL HIGH (ref 70–99)
Potassium: 4.7 mEq/L (ref 3.5–5.1)
Sodium: 140 mEq/L (ref 136–145)
Total Protein: 7.5 g/dL (ref 6.4–8.3)

## 2012-06-03 LAB — LACTATE DEHYDROGENASE (CC13): LDH: 147 U/L (ref 125–245)

## 2012-06-10 ENCOUNTER — Ambulatory Visit (HOSPITAL_BASED_OUTPATIENT_CLINIC_OR_DEPARTMENT_OTHER): Payer: Medicare Other | Admitting: Oncology

## 2012-06-10 ENCOUNTER — Ambulatory Visit (HOSPITAL_BASED_OUTPATIENT_CLINIC_OR_DEPARTMENT_OTHER): Payer: Medicare Other | Admitting: Lab

## 2012-06-10 ENCOUNTER — Telehealth: Payer: Self-pay | Admitting: Oncology

## 2012-06-10 VITALS — BP 140/74 | HR 64 | Temp 96.7°F | Resp 18 | Ht 63.0 in | Wt 163.5 lb

## 2012-06-10 DIAGNOSIS — N289 Disorder of kidney and ureter, unspecified: Secondary | ICD-10-CM

## 2012-06-10 DIAGNOSIS — Z853 Personal history of malignant neoplasm of breast: Secondary | ICD-10-CM

## 2012-06-10 DIAGNOSIS — D649 Anemia, unspecified: Secondary | ICD-10-CM

## 2012-06-10 DIAGNOSIS — I10 Essential (primary) hypertension: Secondary | ICD-10-CM

## 2012-06-10 DIAGNOSIS — C50911 Malignant neoplasm of unspecified site of right female breast: Secondary | ICD-10-CM

## 2012-06-10 LAB — CBC WITH DIFFERENTIAL/PLATELET
BASO%: 0.2 % (ref 0.0–2.0)
Basophils Absolute: 0 10*3/uL (ref 0.0–0.1)
EOS%: 0.9 % (ref 0.0–7.0)
HCT: 33.8 % — ABNORMAL LOW (ref 34.8–46.6)
HGB: 11 g/dL — ABNORMAL LOW (ref 11.6–15.9)
LYMPH%: 30.4 % (ref 14.0–49.7)
MCH: 30.6 pg (ref 25.1–34.0)
MCHC: 32.5 g/dL (ref 31.5–36.0)
NEUT%: 59.6 % (ref 38.4–76.8)
Platelets: 217 10*3/uL (ref 145–400)
lymph#: 1.6 10*3/uL (ref 0.9–3.3)

## 2012-06-10 NOTE — Progress Notes (Signed)
Hematology and Oncology Follow Up Visit  Wanda Green [id number] 1934/03/08 78 y.o. 06/10/2012 7:20 PM   Principle Diagnosis: Encounter Diagnoses  Name Primary?  . Breast cancer, stage 2, right   . Renal insufficiency   . Anemia, normocytic normochromic Yes     Interim History:   Follow-up visit for this pleasant 78 year old woman with a history of stage II, 2-node positive, ER positive, cancer of the right breast initially diagnosed in January 2000, treated with lumpectomy, breast radiation, four cycles of Adriamycin, Cytoxan and Taxotere chemotherapy, five years of tamoxifen, and one year of Arimidex.  I ran into  her over at [facility] back in February. She developed an episode of ischemic versus infectious colitis. Her daughter also had atypical abdominal pain. It appears that the well water from their home was contaminated by an outdated filter system. The water tested positive for bacterial contamination.  Most recent mammograms on 12/05/2011 showed no new disease. She had a recent followup GYN visit with Dr. Audie Box.  I noted on her labs done last week in anticipation of today's visit a new elevation of her creatinine at 1.5. She is not on a diuretic. She has had a chronic, mild, normochromic anemia with hemoglobins running between 11 and 12 g with fall down to 10 g when she was acutely ill in February. Hemoglobin back to her baseline at 11 g today.    Medications: reviewed  Allergies:  Allergies  Allergen Reactions  . Codeine   . Oxycodone-Acetaminophen     Review of Systems: Constitutional:   Resolved constitutional symptoms Respiratory: No cough or dyspnea Cardiovascular:  No chest pain or palpitations Gastrointestinal: See above. Diarrhea nausea vomiting now resolved Genito-Urinary: No urinary tract symptoms, no vaginal bleeding Musculoskeletal: Arthritis pain in her knees but no other muscle bone or joint pain Neurologic: No headache or change in  vision Skin: No rash Remaining ROS negative.  Physical Exam: Blood pressure 140/74, pulse 64, temperature 96.7 F (35.9 C), temperature source Oral, resp. rate 18, height 5\' 3"  (1.6 m), weight 163 lb 8 oz (74.163 kg). Wt Readings from Last 3 Encounters:  06/10/12 163 lb 8 oz (74.163 kg)  05/29/12 162 lb (73.483 kg)  04/09/12 164 lb 3.9 oz (74.5 kg)     General appearance: Well-nourished Caucasian woman HENNT: Pharynx no erythema or exudate Lymph nodes: No cervical, supraclavicular, or axillary adenopathy Breasts: Surgical changes right breast, no dominant mass in either breast Lungs: Clear to auscultation resonant to percussion Heart: Regular rhythm no murmur Abdomen: Soft, nontender, no mass, no organomegaly Extremities: No edema, no calf tenderness Vascular: No cyanosis Neurologic: No focal deficit Skin:  Lab Results: Lab Results  Component Value Date   WBC 5.3 06/10/2012   HGB 11.0* 06/10/2012   HCT 33.8* 06/10/2012   MCV 93.9 06/10/2012   PLT 217 06/10/2012     Chemistry      Component Value Date/Time   NA 138 06/10/2012 1327   NA 140 06/03/2012 1139   K 4.5 06/10/2012 1327   K 4.7 06/03/2012 1139   CL 103 06/10/2012 1327   CL 105 06/03/2012 1139   CO2 27 06/10/2012 1327   CO2 25 06/03/2012 1139   BUN 29* 06/10/2012 1327   BUN 28.0* 06/03/2012 1139   CREATININE 1.43* 06/10/2012 1327   CREATININE 1.5* 06/03/2012 1139      Component Value Date/Time   CALCIUM 9.9 06/10/2012 1327   CALCIUM 9.4 06/03/2012 1139   ALKPHOS 47 06/10/2012 1327  ALKPHOS 70 06/03/2012 1139   AST 14 06/10/2012 1327   AST 15 06/03/2012 1139   ALT 12 06/10/2012 1327   ALT 14 06/03/2012 1139   BILITOT 0.5 06/10/2012 1327   BILITOT 0.32 06/03/2012 1139      Impression and Plan: #1. Stage II, node positive, ER positive cancer of the right breast treated as outlined above. She remains free of recurrence now out over 14 years.  #2. New elevation of creatinine. Although her anemia is chronic, I'm going to go ahead and check a serum  protein electrophoresis and immunoelectrophoresis as well as a spot urine IFE.  #3. Recent admission for acute infectious colitis-resolved  #4. Essential hypertension   CC:.    Levert Feinstein, MD 4/8/20147:20 PM and

## 2012-06-10 NOTE — Patient Instructions (Signed)
See you next year :)

## 2012-06-12 LAB — COMPREHENSIVE METABOLIC PANEL
ALT: 12 U/L (ref 0–35)
AST: 14 U/L (ref 0–37)
BUN: 29 mg/dL — ABNORMAL HIGH (ref 6–23)
CO2: 27 mEq/L (ref 19–32)
Calcium: 9.9 mg/dL (ref 8.4–10.5)
Chloride: 103 mEq/L (ref 96–112)
Creatinine, Ser: 1.43 mg/dL — ABNORMAL HIGH (ref 0.50–1.10)
Total Bilirubin: 0.5 mg/dL (ref 0.3–1.2)

## 2012-06-12 LAB — IMMUNOFIXATION ELECTROPHORESIS
IgG (Immunoglobin G), Serum: 1320 mg/dL (ref 690–1700)
Total Protein, Serum Electrophoresis: 7.7 g/dL (ref 6.0–8.3)

## 2012-06-24 ENCOUNTER — Ambulatory Visit (INDEPENDENT_AMBULATORY_CARE_PROVIDER_SITE_OTHER): Payer: Medicare Other | Admitting: Internal Medicine

## 2012-06-24 VITALS — BP 152/86 | HR 69 | Temp 98.2°F | Resp 18 | Ht 63.5 in | Wt 163.0 lb

## 2012-06-24 DIAGNOSIS — I10 Essential (primary) hypertension: Secondary | ICD-10-CM | POA: Insufficient documentation

## 2012-06-24 DIAGNOSIS — I4891 Unspecified atrial fibrillation: Secondary | ICD-10-CM

## 2012-06-24 DIAGNOSIS — J329 Chronic sinusitis, unspecified: Secondary | ICD-10-CM

## 2012-06-24 DIAGNOSIS — J301 Allergic rhinitis due to pollen: Secondary | ICD-10-CM | POA: Insufficient documentation

## 2012-06-24 DIAGNOSIS — E785 Hyperlipidemia, unspecified: Secondary | ICD-10-CM | POA: Insufficient documentation

## 2012-06-24 MED ORDER — AMOXICILLIN 875 MG PO TABS: 875.0000 mg | ORAL_TABLET | Freq: Two times a day (BID) | ORAL | Status: DC

## 2012-06-24 MED ORDER — PREDNISONE 20 MG PO TABS: ORAL_TABLET | ORAL | Status: DC

## 2012-06-24 MED ORDER — MONTELUKAST SODIUM 10 MG PO TABS: 10.0000 mg | ORAL_TABLET | Freq: Every day | ORAL | Status: DC

## 2012-06-24 NOTE — Progress Notes (Signed)
  Subjective:    Patient ID: Wanda Green, female    DOB: 1934-07-01, 78 y.o.   MRN: [id number]  HPIhere w/ daughter w/ chr sinus probs 18mos ago CT x3 rosen clark(burl)x2---chr sinu Variable res rx All shots 6 mos no chg Cont w/ chr pnd,am clogged, thr mucus w/ freq clearing No dramatic chg w/ spr pollen  Past Medical History  Diagnosis Date  . Breast cancer   . Hypertension   . Osteopenia   . Colitis, ischemic   . Elevated cholesterol   . Cervical dysplasia age 42  . Arthritis   . Osteoporosis       Review of Systems  Constitutional: Negative for fever, chills, activity change and fatigue.       No NS  Respiratory: Negative for wheezing.   Cardiovascular: Negative for palpitations and leg swelling.  Neurological: Negative for headaches.       Objective:   Physical Exam BP 152/86  Pulse 69  Temp(Src) 98.2 F (36.8 C) (Oral)  Resp 18  Ht 5' 3.5" (1.613 m)  Wt 163 lb (73.936 kg)  BMI 28.42 kg/m2  SpO2 100% Conj sl inj tms cl Nares boggy wpurl muc thr cl No nodes Chest cl       Assessment & Plan:  Sinusitis, chronic  Atrial fibrillation  HTN (hypertension)  Other and unspecified hyperlipidemia  Allergic rhinitis due to pollen  Meds ordered this encounter  Medications  . amoxicillin (AMOXIL) 875 MG tablet    Sig: Take 1 tablet (875 mg total) by mouth 2 (two) times daily. For 30 days    Dispense:  60 tablet    Refill:  0  . montelukast (SINGULAIR) 10 MG tablet    Sig: Take 1 tablet (10 mg total) by mouth at bedtime.    Dispense:  30 tablet    Refill:  3  . predniSONE (DELTASONE) 20 MG tablet    Sig: 3/3/2/2/1/1 single daily dose for 6 days    Dispense:  12 tablet    Refill:  0   Ref [facility] if no chg

## 2012-06-26 ENCOUNTER — Ambulatory Visit (INDEPENDENT_AMBULATORY_CARE_PROVIDER_SITE_OTHER): Payer: Medicare Other

## 2012-06-26 DIAGNOSIS — M810 Age-related osteoporosis without current pathological fracture: Secondary | ICD-10-CM

## 2012-06-26 DIAGNOSIS — M8580 Other specified disorders of bone density and structure, unspecified site: Secondary | ICD-10-CM

## 2012-06-30 ENCOUNTER — Encounter: Payer: Self-pay | Admitting: Gynecology

## 2012-07-14 ENCOUNTER — Ambulatory Visit (INDEPENDENT_AMBULATORY_CARE_PROVIDER_SITE_OTHER): Payer: Medicare Other | Admitting: Internal Medicine

## 2012-07-14 VITALS — BP 163/83 | HR 70 | Temp 98.5°F | Resp 16 | Ht 63.5 in | Wt 163.0 lb

## 2012-07-14 DIAGNOSIS — K219 Gastro-esophageal reflux disease without esophagitis: Secondary | ICD-10-CM

## 2012-07-14 DIAGNOSIS — J329 Chronic sinusitis, unspecified: Secondary | ICD-10-CM

## 2012-07-14 DIAGNOSIS — G4710 Hypersomnia, unspecified: Secondary | ICD-10-CM

## 2012-07-14 MED ORDER — AMOXICILLIN-POT CLAVULANATE 875-125 MG PO TABS: 1.0000 | ORAL_TABLET | Freq: Two times a day (BID) | ORAL | Status: DC

## 2012-07-14 MED ORDER — OMEPRAZOLE 40 MG PO CPDR: 40.0000 mg | DELAYED_RELEASE_CAPSULE | Freq: Every day | ORAL | Status: DC | PRN

## 2012-07-14 NOTE — Progress Notes (Signed)
  Subjective:    Patient ID: Wanda Green, female    DOB: 1934-05-16, 78 y.o.   MRN: [id number]  HPI she did not respond to recent intervention for chronic sinusitis She had not responded outside treatment by ear nose and throat/they suggested allergy testing but she felt this was an adequate CT [place]-Dr Chestine Spore showed obstruction/fluid R side after treatment with antibiotics twice  She also needs refills of omeprazole for GERD  Review of Systems She wakes 3 or 4 times a night with congestion and often has to sit up to sleep She describes symptoms of daytime hypersomnolence    Objective:   Physical Exam BP 163/83  Pulse 70  Temp(Src) 98.5 F (36.9 C) (Oral)  Resp 16  Ht 5' 3.5" (1.613 m)  Wt 163 lb (73.936 kg)  BMI 28.42 kg/m2  SpO2 98% Exam unchanged       Assessment & Plan:  Sinusitis, chronic--will set up an evaluation at [facility]  Hypersomnolence-the potential for sleep apnea is significant  Hypertension not well controlled-to followup with PCP  GERD (gastroesophageal reflux disease)-ref omep    Meds ordered this encounter  Medications  . amoxicillin-clavulanate (AUGMENTIN) 875-125 MG per tablet    Sig: Take 1 tablet by mouth 2 (two) times daily.    Dispense:  20 tablet    Refill:  0  . omeprazole (PRILOSEC) 40 MG capsule    Sig: Take 1 capsule (40 mg total) by mouth daily as needed.    Dispense:  90 capsule    Refill:  3

## 2012-07-18 ENCOUNTER — Telehealth: Payer: Self-pay

## 2012-07-18 NOTE — Telephone Encounter (Signed)
Pt called stating that the gi office at [facility] [place] is calling to schedule appt but dr Merla Riches had recommended a female provider, and they are not sure who it is and it is not in the notes  Please call patient at [phone]

## 2012-07-18 NOTE — Telephone Encounter (Signed)
To otolaryngology at [facility] Call secretary for Dr Kipp Brood Senior's division to see who would be best to see this patient for chronic Sinusitis with sleep disruption. Called patient to advise. Left message for her to call me back.

## 2012-07-22 NOTE — Telephone Encounter (Signed)
Left message if she still has questions she is to call me back.

## 2012-08-07 ENCOUNTER — Ambulatory Visit (INDEPENDENT_AMBULATORY_CARE_PROVIDER_SITE_OTHER): Payer: Medicare Other | Admitting: Gynecology

## 2012-08-07 ENCOUNTER — Encounter: Payer: Self-pay | Admitting: Gynecology

## 2012-08-07 DIAGNOSIS — M810 Age-related osteoporosis without current pathological fracture: Secondary | ICD-10-CM

## 2012-08-07 MED ORDER — ALENDRONATE SODIUM 70 MG PO TABS: 70.0000 mg | ORAL_TABLET | ORAL | Status: DC

## 2012-08-07 NOTE — Patient Instructions (Signed)
Alendronate tablets What is this medicine? ALENDRONATE (a LEN droe nate) slows calcium loss from bones. It helps to make normal healthy bone and to slow bone loss in people with Paget's disease and osteoporosis. It may be used in others at risk for bone loss. This medicine may be used for other purposes; ask your health care provider or pharmacist if you have questions. What should I tell my health care provider before I take this medicine? They need to know if you have any of these conditions: -dental disease -esophagus, stomach, or intestine problems, like acid reflux or GERD -kidney disease -low blood calcium -low vitamin D -problems sitting or standing 30 minutes -trouble swallowing -an unusual or allergic reaction to alendronate, other medicines, foods, dyes, or preservatives -pregnant or trying to get pregnant -breast-feeding How should I use this medicine? You must take this medicine exactly as directed or you will lower the amount of the medicine you absorb into your body or you may cause yourself harm. Take this medicine by mouth first thing in the morning, after you are up for the day. Do not eat or drink anything before you take your medicine. Swallow the tablet with a full glass (6 to 8 fluid ounces) of plain water. Do not take this medicine with any other drink. Do not chew or crush the tablet. After taking this medicine, do not eat breakfast, drink, or take any medicines or vitamins for at least 30 minutes. Sit or stand up for at least 30 minutes after you take this medicine; do not lie down. Do not take your medicine more often than directed. Talk to your pediatrician regarding the use of this medicine in children. Special care may be needed. Overdosage: If you think you have taken too much of this medicine contact a poison control center or emergency room at once. NOTE: This medicine is only for you. Do not share this medicine with others. What if I miss a dose? If you miss a  dose, do not take it later in the day. Continue your normal schedule starting the next morning. Do not take double or extra doses. What may interact with this medicine? -aluminum hydroxide -antacids -aspirin -calcium supplements -drugs for inflammation like ibuprofen, naproxen, and others -iron supplements -magnesium supplements -vitamins with minerals This list may not describe all possible interactions. Give your health care provider a list of all the medicines, herbs, non-prescription drugs, or dietary supplements you use. Also tell them if you smoke, drink alcohol, or use illegal drugs. Some items may interact with your medicine. What should I watch for while using this medicine? Visit your doctor or health care professional for regular checks ups. It may be some time before you see benefit from this medicine. Do not stop taking your medicine except on your doctor's advice. Your doctor or health care professional may order blood tests and other tests to see how you are doing. You should make sure you get enough calcium and vitamin D while you are taking this medicine, unless your doctor tells you not to. Discuss the foods you eat and the vitamins you take with your health care professional. Some people who take this medicine have severe bone, joint, and/or muscle pain. This medicine may also increase your risk for a broken thigh bone. Tell your doctor right away if you have pain in your upper leg or groin. Tell your doctor if you have any pain that does not go away or that gets worse. This medicine can make  you more sensitive to the sun. If you get a rash while taking this medicine, sunlight may cause the rash to get worse. Keep out of the sun. If you cannot avoid being in the sun, wear protective clothing and use sunscreen. Do not use sun lamps or tanning beds/booths. What side effects may I notice from receiving this medicine? Side effects that you should report to your doctor or health care  professional as soon as possible: -allergic reactions like skin rash, itching or hives, swelling of the face, lips, or tongue -black or tarry stools -bone, muscle or joint pain -changes in vision -chest pain -heartburn or stomach pain -jaw pain, especially after dental work -pain or trouble when swallowing -redness, blistering, peeling or loosening of the skin, including inside the mouth Side effects that usually do not require medical attention (report to your doctor or health care professional if they continue or are bothersome): -changes in taste -diarrhea or constipation -eye pain or itching -headache -nausea or vomiting -stomach gas or fullness This list may not describe all possible side effects. Call your doctor for medical advice about side effects. You may report side effects to FDA at 1-800-FDA-1088. Where should I keep my medicine? Keep out of the reach of children. Store at room temperature of 15 and 30 degrees C (59 and 86 degrees F). Throw away any unused medicine after the expiration date. NOTE: This sheet is a summary. It may not cover all possible information. If you have questions about this medicine, talk to your doctor, pharmacist, or health care provider.  2013, [employer]. (08/18/2010 8:56:09 AM)

## 2012-08-07 NOTE — Progress Notes (Signed)
Patient presents to discuss her most recent DEXA. T score -2.5 left femoral neck. -2.2 right femoral neck. History of transient use of Boniva several years ago for one year and stopped due to GI upset. Prior DEXA 01/2010 T score -1.9 FRAX 12%/2.5%.  I reviewed her current situation and my recommendation to initiate treatment given the diagnosis of osteoporosis, her age and her admitted history of falling. Also has a younger sister with significant osteoporosis. I reviewed options and my recommendation would be to initiate with Fosamax 70 mg weekly. Side effect profile and risks reviewed to include GERD, esophageal cancer, osteonecrosis of the jaw and atypical fractures particularly with prolonged use. She did have issues with Boniva apparently with GERD. If she does develop this discussed possible Prolia use. She is willing to try and we'll go ahead and initiate the Fosamax. Proper way of taking the medication was reviewed with her. Patient will call me if she has any issues.

## 2012-08-12 ENCOUNTER — Encounter: Payer: Self-pay | Admitting: Pulmonary Disease

## 2012-08-12 ENCOUNTER — Ambulatory Visit (INDEPENDENT_AMBULATORY_CARE_PROVIDER_SITE_OTHER): Payer: Medicare Other | Admitting: Pulmonary Disease

## 2012-08-12 VITALS — BP 152/70 | HR 70 | Temp 97.8°F | Ht 62.5 in | Wt 164.8 lb

## 2012-08-12 DIAGNOSIS — G4733 Obstructive sleep apnea (adult) (pediatric): Secondary | ICD-10-CM

## 2012-08-12 NOTE — Progress Notes (Signed)
  Subjective:    Patient ID: Wanda Green, female    DOB: 10-May-1934, 78 y.o.   MRN: [id number]  HPI The patient is a 78 year old female who lives asked to see for possible obstructive sleep apnea.  She has been noted to have snoring, but no one has observed definite apneas.  She has frequent awakenings at night, and is not rested in the mornings upon arising.  She has never been told that she kicks during the night, and denies any classic RLS symptoms.  She admits to having definite inappropriate daytime sleepiness, and can actually fall asleep during quiet times.  She also tells asleep easily in the evenings watching television or movies.  She has some sleep pressure with driving.  The patient's weight is down 10 pounds over the last 2 years, and her Epworth score today is 13.   Review of Systems  Constitutional: Negative for fever and unexpected weight change.  HENT: Positive for congestion and sinus pressure. Negative for ear pain, nosebleeds, sore throat, rhinorrhea, sneezing, trouble swallowing, dental problem and postnasal drip.        Chronic sinusitis  Eyes: Negative for redness and itching.  Respiratory: Positive for choking ( d/t sinus drainage). Negative for cough, chest tightness, shortness of breath and wheezing.   Cardiovascular: Negative for palpitations and leg swelling.  Gastrointestinal: Negative for nausea and vomiting.  Genitourinary: Negative for dysuria.  Musculoskeletal: Negative for joint swelling.  Skin: Negative for rash.  Neurological: Negative for headaches.  Hematological: Does not bruise/bleed easily.  Psychiatric/Behavioral: Negative for dysphoric mood. The patient is nervous/anxious ( with waking up during night).        Objective:   Physical Exam Constitutional:  Well developed, no acute distress  HENT:  Nares patent without discharge, mild septal deviation to the left  Oropharynx without exudate, palate and uvula are moderately thickened and  elongated.   Eyes:  Perrla, eomi, no scleral icterus  Neck:  No JVD, no TMG  Cardiovascular:  Normal rate, regular rhythm, no rubs or gallops.  2/6 sem        Intact distal pulses  Pulmonary :  Normal breath sounds, no stridor or respiratory distress   No rales, rhonchi, or wheezing  Abdominal:  Soft, nondistended, bowel sounds present.  No tenderness noted.   Musculoskeletal:  No lower extremity edema noted, +varicosities.   Lymph Nodes:  No cervical lymphadenopathy noted  Skin:  No cyanosis noted  Neurologic:  Alert, appropriate, moves all 4 extremities without obvious deficit.         Assessment & Plan:

## 2012-08-12 NOTE — Assessment & Plan Note (Signed)
The patient's history is definitely suggestive of sleep disordered breathing.  I had a long discussion with her about sleep apnea, including its impact to her cardiovascular health and quality of life.  I think she needs to have a sleep study for diagnosis, it is a reasonable candidate for sleep testing.  The patient is agreeable to this approach.

## 2012-08-12 NOTE — Patient Instructions (Addendum)
Will schedule you for home sleep testing, and will call once results are available.  Work on weight loss.

## 2012-09-01 ENCOUNTER — Ambulatory Visit: Payer: Medicare Other | Admitting: Pulmonary Disease

## 2012-09-01 DIAGNOSIS — R0989 Other specified symptoms and signs involving the circulatory and respiratory systems: Secondary | ICD-10-CM

## 2012-09-01 DIAGNOSIS — G4733 Obstructive sleep apnea (adult) (pediatric): Secondary | ICD-10-CM

## 2012-09-01 DIAGNOSIS — R0609 Other forms of dyspnea: Secondary | ICD-10-CM

## 2012-09-01 DIAGNOSIS — G478 Other sleep disorders: Secondary | ICD-10-CM

## 2012-09-04 ENCOUNTER — Telehealth: Payer: Self-pay | Admitting: Pulmonary Disease

## 2012-09-04 DIAGNOSIS — G4733 Obstructive sleep apnea (adult) (pediatric): Secondary | ICD-10-CM

## 2012-09-04 DIAGNOSIS — I498 Other specified cardiac arrhythmias: Secondary | ICD-10-CM

## 2012-09-04 NOTE — Telephone Encounter (Signed)
Wanda Green, please get this pt in for OV to review her sleep study.

## 2012-09-04 NOTE — Telephone Encounter (Signed)
Needs OV to review sleep study LMOM x 1  Msg states that we are closed 09/05/12 will reopen on Monday 7/7 at 8am

## 2012-09-15 NOTE — Telephone Encounter (Signed)
Patient scheduled for 7.23/14 appt with [facility] @330p  Nothing further needed.

## 2012-09-24 ENCOUNTER — Encounter: Payer: Self-pay | Admitting: Pulmonary Disease

## 2012-09-24 ENCOUNTER — Ambulatory Visit (INDEPENDENT_AMBULATORY_CARE_PROVIDER_SITE_OTHER): Payer: Medicare Other | Admitting: Pulmonary Disease

## 2012-09-24 VITALS — BP 122/72 | HR 54 | Temp 97.9°F | Ht 63.5 in | Wt 165.4 lb

## 2012-09-24 DIAGNOSIS — G4733 Obstructive sleep apnea (adult) (pediatric): Secondary | ICD-10-CM

## 2012-09-24 NOTE — Progress Notes (Signed)
  Subjective:    Patient ID: Wanda Green, female    DOB: February 01, 1935, 78 y.o.   MRN: [id number]  HPI Patient comes in today for followup of her recent sleep study.  She was found to have mild obstructive sleep apnea, with an AHI of 7 events per hour.  I have reviewed this study with her in detail, and answered all of her questions.   Review of Systems  Constitutional: Negative for fever and unexpected weight change.  HENT: Negative for ear pain, nosebleeds, congestion, sore throat, rhinorrhea, sneezing, trouble swallowing, dental problem, postnasal drip and sinus pressure.   Eyes: Negative for redness and itching.  Respiratory: Negative for cough, chest tightness, shortness of breath and wheezing.   Cardiovascular: Negative for palpitations and leg swelling.  Gastrointestinal: Negative for nausea and vomiting.  Genitourinary: Negative for dysuria.  Musculoskeletal: Negative for joint swelling.  Skin: Negative for rash.  Neurological: Negative for headaches.  Hematological: Does not bruise/bleed easily.  Psychiatric/Behavioral: Negative for dysphoric mood. The patient is not nervous/anxious.        Objective:   Physical Exam Overweight female in no acute distress Nose without purulence or discharge noted Neck without lymphadenopathy or thyromegaly Lower extremities without edema, cyanosis Awake, but appears mildly sleepy, moves all 4 extremities.       Assessment & Plan:

## 2012-09-24 NOTE — Patient Instructions (Addendum)
Will start you on a trial of cpap to see if it helps your breathing.  Please call if you are having tolerance issues. Work on weight loss followup with me in 6 weeks.

## 2012-09-24 NOTE — Assessment & Plan Note (Signed)
The patient is having frequent awakenings at night and sleep disruption leading to daytime sleep pressure, but it is unclear if her very mild sleep apnea on her sleep study is responsible for this.  There is no way to determine if this is the issue with her sleep unless we treat her sleep apnea and see if she has significant improvement.  I have outlined CPAP and a dental appliance as her possible treatment options while she is working on weight loss.  It should be noted that she is to have nasal and sinus surgery in approximately 4-5 weeks, and it is possible this may solve her issue.  In the interim, the patient feels that she is symptomatic enough that she would like to try CPAP.

## 2012-09-26 ENCOUNTER — Ambulatory Visit: Payer: Medicare Other

## 2012-09-26 ENCOUNTER — Ambulatory Visit (INDEPENDENT_AMBULATORY_CARE_PROVIDER_SITE_OTHER): Payer: Medicare Other | Admitting: Internal Medicine

## 2012-09-26 VITALS — BP 118/76 | HR 60 | Temp 98.0°F | Resp 16 | Ht 63.0 in | Wt 165.2 lb

## 2012-09-26 DIAGNOSIS — M542 Cervicalgia: Secondary | ICD-10-CM

## 2012-09-26 DIAGNOSIS — M5030 Other cervical disc degeneration, unspecified cervical region: Secondary | ICD-10-CM

## 2012-09-26 MED ORDER — TIZANIDINE HCL 4 MG PO TABS: ORAL_TABLET | ORAL | Status: DC

## 2012-09-26 NOTE — Progress Notes (Signed)
  Subjective:    Patient ID: Wanda Green, female    DOB: Nov 22, 1934, 78 y.o.   MRN: [id number]  HPI stiff neck w/ HAs since febru when in hospital Tylenol works but needed daily-HA frontal-achy No vis chg, N,V Radiates down side of neck No prior injury/no sensory or motor symptoms in arms or any and No history of prior injury  Dr Rush Landmark for atrial fibrillation and hypertension/recently gave her Valium to try for her neck pain  Dr senior-ENT Kendell Bane has diagnosis of a chronic fungal infection of the sinuses and is preparing to do surgery for septal deviation and sinus drainage.  Dr Mathis Dad evaluated for sleep problems and feels she has a mild sleep dysfunction but does not require CPAP  Review of Systems Other systems noncontributory    She was changed to Coumadin because she could not afford equis   Objective:   Physical Exam BP 118/76  Pulse 60  Temp(Src) 98 F (36.7 C) (Oral)  Resp 16  Ht 5\' 3"  (1.6 m)  Wt 165 lb 3.2 oz (74.934 kg)  BMI 29.27 kg/m2  SpO2 100% Pupils equal round reactive to light and accommodation/EOMs conjugate Tender to palpation over the posterior cervical muscles and the left trapezius Range of motion of the neck decreased due to discomfort with rotation to the left and extension Neurological intact in the upper extremities Shoulder range of motion good bilaterally      [facility] reading (PRIMARY) by  Dr. Merla Riches significant degenerative changes at C4-5 and 6 with osteophyte formation and narrowed disc spaces   Assessment & Plan:  Neck pain secondary to cervical degenerative disc disease  Rehabilitation exercises started Meds ordered this encounter  Medications      . tiZANidine (ZANAFLEX) 4 MG tablet    Sig: One at bedtime    Dispense:  30 tablet    Refill:  0  Flexeril is not covered by Medicare She elects home exercises rather than physical therapy at this point

## 2012-10-08 ENCOUNTER — Other Ambulatory Visit: Payer: Self-pay

## 2012-10-21 DIAGNOSIS — Z8742 Personal history of other diseases of the female genital tract: Secondary | ICD-10-CM | POA: Insufficient documentation

## 2012-10-21 DIAGNOSIS — Z853 Personal history of malignant neoplasm of breast: Secondary | ICD-10-CM | POA: Insufficient documentation

## 2012-10-21 DIAGNOSIS — Z87891 Personal history of nicotine dependence: Secondary | ICD-10-CM | POA: Insufficient documentation

## 2012-10-21 DIAGNOSIS — Z8719 Personal history of other diseases of the digestive system: Secondary | ICD-10-CM | POA: Insufficient documentation

## 2012-10-21 DIAGNOSIS — S92919A Unspecified fracture of unspecified toe(s), initial encounter for closed fracture: Secondary | ICD-10-CM | POA: Insufficient documentation

## 2012-10-21 DIAGNOSIS — Z7901 Long term (current) use of anticoagulants: Secondary | ICD-10-CM | POA: Insufficient documentation

## 2012-10-21 DIAGNOSIS — X500XXA Overexertion from strenuous movement or load, initial encounter: Secondary | ICD-10-CM | POA: Insufficient documentation

## 2012-10-21 DIAGNOSIS — Z8739 Personal history of other diseases of the musculoskeletal system and connective tissue: Secondary | ICD-10-CM | POA: Insufficient documentation

## 2012-10-21 DIAGNOSIS — Y9389 Activity, other specified: Secondary | ICD-10-CM | POA: Insufficient documentation

## 2012-10-21 DIAGNOSIS — I10 Essential (primary) hypertension: Secondary | ICD-10-CM | POA: Insufficient documentation

## 2012-10-21 DIAGNOSIS — E7800 Pure hypercholesterolemia, unspecified: Secondary | ICD-10-CM | POA: Insufficient documentation

## 2012-10-21 DIAGNOSIS — R209 Unspecified disturbances of skin sensation: Secondary | ICD-10-CM | POA: Insufficient documentation

## 2012-10-21 DIAGNOSIS — Z79899 Other long term (current) drug therapy: Secondary | ICD-10-CM | POA: Insufficient documentation

## 2012-10-21 DIAGNOSIS — Y929 Unspecified place or not applicable: Secondary | ICD-10-CM | POA: Insufficient documentation

## 2012-10-22 ENCOUNTER — Emergency Department (HOSPITAL_COMMUNITY)
Admission: EM | Admit: 2012-10-22 | Discharge: 2012-10-22 | Disposition: A | Discharge status: Home / Self Care | Payer: Medicare Other | Attending: Emergency Medicine | Admitting: Emergency Medicine

## 2012-10-22 ENCOUNTER — Encounter (HOSPITAL_COMMUNITY): Payer: Self-pay | Admitting: Emergency Medicine

## 2012-10-22 ENCOUNTER — Emergency Department (HOSPITAL_COMMUNITY)
Admit: 2012-10-22 | Discharge: 2012-10-22 | Disposition: A | Discharge status: Home / Self Care | Payer: Medicare Other | Attending: Emergency Medicine | Admitting: Emergency Medicine

## 2012-10-22 DIAGNOSIS — S92501A Displaced unspecified fracture of right lesser toe(s), initial encounter for closed fracture: Secondary | ICD-10-CM

## 2012-10-22 LAB — PROTIME-INR
INR: 2.12 — ABNORMAL HIGH (ref 0.00–1.49)
Prothrombin Time: 23.1 seconds — ABNORMAL HIGH (ref 11.6–15.2)

## 2012-10-22 MED ORDER — TRAMADOL HCL 50 MG PO TABS
50.0000 mg | ORAL_TABLET | Freq: Once | ORAL | Status: AC
  Administered 2012-10-22: 50 mg via ORAL
  Filled 2012-10-22: qty 1

## 2012-10-22 MED ORDER — TRAMADOL HCL 50 MG PO TABS: 50.0000 mg | ORAL_TABLET | Freq: Four times a day (QID) | ORAL | Status: DC | PRN

## 2012-10-22 NOTE — ED Notes (Signed)
PT. FELL AFTER BEING HIT BY A HORSE THIS EVENING IN A BARN AND INJURED HER RIGHT FOOT , NO LOC , ALERT AND ORIENTED , PRESENTS WITH RIGHT FOOT SWELLING/PAIN .

## 2012-10-22 NOTE — ED Provider Notes (Signed)
CSN: [id number]     Arrival date & time 10/21/12  2339 History     None    Chief Complaint  Patient presents with  . Foot Injury   (Consider location/radiation/quality/duration/timing/severity/associated sxs/prior Treatment) HPI History provided by pt.   Pt was pushed by a horse yesterday evening when it was spooked in the barn, causing her to twist her right foot and fall to the ground.  Did not hit her head and denies neck/back pain.  C/o severe pain in right foot.  Unable to bear weight.  Associated w/ edema as well as numbness of fifth digit.  Pt is on coumadin and INR most recently checked 2 weeks ago.  Past Medical History  Diagnosis Date  . Breast cancer   . Hypertension   . Osteoporosis 06/2012    T score -2.5  . Colitis, ischemic   . Elevated cholesterol   . Cervical dysplasia age 66  . Arthritis    Past Surgical History  Procedure Laterality Date  . Bi lateral knee scope    . Breast surgery  2000    RIGHT BREAST LUMPECTOMY  . Tubal ligation    . Colposcopy    . Cervical cone biopsy  age 61   Family History  Problem Relation Age of Onset  . Hypertension Mother   . Heart failure Mother   . Heart disease Mother   . Hypertension Father   . Heart disease Father   . Hypertension Sister   . Uterine cancer Sister   . Hypertension Brother    History  Substance Use Topics  . Smoking status: Former Smoker -- 0.10 packs/day for 3 years    Types: Cigarettes    Quit date: 03/05/1972  . Smokeless tobacco: Not on file     Comment: ONE CIG A DAY  . Alcohol Use: No   OB History   Grav Para Term Preterm Abortions TAB SAB Ect Mult Living   3 3 3       3      Review of Systems  All other systems reviewed and are negative.    Allergies  Codeine and Oxycodone-acetaminophen  Home Medications   Current Outpatient Rx  Name  Route  Sig  Dispense  Refill  . alendronate (FOSAMAX) 70 MG tablet               . amiodarone (PACERONE) 200 MG tablet   Oral   Take  200 mg by mouth daily.         Marland Kitchen atorvastatin (LIPITOR) 20 MG tablet   Oral   Take 1 tablet (20 mg total) by mouth daily at 6 PM.   30 tablet   3   . BENICAR 40 MG tablet               . cetirizine (ZYRTEC) 10 MG tablet   Oral   Take 10 mg by mouth daily.         . diazepam (VALIUM) 5 MG tablet               . ELIQUIS 2.5 MG TABS tablet               . guaifenesin (HUMIBID E) 400 MG TABS   Oral   Take 400 mg by mouth daily.         Marland Kitchen losartan (COZAAR) 100 MG tablet   Oral   Take 100 mg by mouth daily.         Marland Kitchen  metoprolol succinate (TOPROL-XL) 25 MG 24 hr tablet   Oral   Take 25 mg by mouth daily.         . montelukast (SINGULAIR) 10 MG tablet               . omeprazole (PRILOSEC) 40 MG capsule   Oral   Take 1 capsule (40 mg total) by mouth daily as needed.   90 capsule   3   . solifenacin (VESICARE) 10 MG tablet   Oral   Take 1 tablet (10 mg total) by mouth daily.   30 tablet   11   . tiZANidine (ZANAFLEX) 4 MG tablet      One at bedtime   30 tablet   0   . traMADol (ULTRAM) 50 MG tablet   Oral   Take 1 tablet (50 mg total) by mouth every 6 (six) hours as needed for pain.   15 tablet   0   . warfarin (COUMADIN) 2 MG tablet   Oral   Take 2 mg by mouth daily.          BP 149/67  Pulse 67  Temp(Src) 98.3 F (36.8 C) (Oral)  Resp 18  SpO2 96% Physical Exam  Nursing note and vitals reviewed. Constitutional: She is oriented to person, place, and time. She appears well-developed and well-nourished. No distress.  HENT:  Head: Normocephalic and atraumatic.  Eyes:  Normal appearance  Neck: Normal range of motion.  Pulmonary/Chest: Effort normal.  Musculoskeletal: Normal range of motion.  Right foot diffusely edematous and ttp and with significant ecchymosis.  Tenderness pinky toe and pain w/ ROM. Full, active ROM of ankle and rest of toes w/ minimal pain.  2+ DP pulse.  Decreased sensation distal pinky toe.   Neurological: She is alert and oriented to person, place, and time.  Psychiatric: She has a normal mood and affect. Her behavior is normal.    ED Course   Procedures (including critical care time)  Labs Reviewed  PROTIME-INR - Abnormal; Notable for the following:    Prothrombin Time 23.1 (*)    INR 2.12 (*)    All other components within normal limits   Dg Foot Complete Right  10/22/2012   *RADIOLOGY REPORT*  Clinical Data: Right foot pain, bruising, and swelling secondary to trauma.  RIGHT FOOT COMPLETE - 3+ VIEW  Comparison: None.  Findings: There is an angulated fracture of the proximal shaft of the proximal phalangeal bone of the little toe.  There also appears be a hairline fracture of the distal phalangeal bone of the little toe.  No other acute abnormalities.  Arthritic changes at the first metatarsal phalangeal joint and of the interphalangeal joints of the other toes.  Small plantar calcaneal spur.  IMPRESSION: Fractures of the proximal and distal bones of the little toe.   Original Report Authenticated By: Francene Boyers, M.D.   1. Fracture of fifth toe, right, closed, initial encounter     MDM  77yo F sustained R foot injury in mechanical fall last night.  Significant ecchymosis of foot on exam.  Anti-coagulated w/ coumadin; INR 2.12 today.  Xray shows fx of 5th digit.  NV intact w/ exception of decreased sensation in 5th digit.  Ortho tech buddy taped toes and provided her with a cam walker.  She received one ultram for pain in ED and d/c'd home w/ same.  Recommended ice/elevation and f/u with ortho.    Otilio Miu, PA-C 10/22/12 [phone]

## 2012-10-23 NOTE — ED Provider Notes (Signed)
Medical screening examination/treatment/procedure(s) were performed by non-physician practitioner and as supervising physician I was immediately available for consultation/collaboration.    Charles B. Sheldon, MD 10/23/12 1134 

## 2012-11-07 ENCOUNTER — Ambulatory Visit: Payer: Medicare Other | Admitting: Pulmonary Disease

## 2012-11-28 ENCOUNTER — Ambulatory Visit (INDEPENDENT_AMBULATORY_CARE_PROVIDER_SITE_OTHER): Payer: Medicare Other | Admitting: Internal Medicine

## 2012-11-28 VITALS — BP 142/78 | HR 75 | Temp 97.5°F | Resp 18 | Ht 63.0 in | Wt 175.0 lb

## 2012-11-28 DIAGNOSIS — M542 Cervicalgia: Secondary | ICD-10-CM

## 2012-11-28 MED ORDER — PREDNISONE 20 MG PO TABS: 20.0000 mg | ORAL_TABLET | Freq: Every day | ORAL | Status: DC

## 2012-11-28 NOTE — Progress Notes (Signed)
  Subjective:    Patient ID: ADRINNE Green, female    DOB: 1934/04/13, 78 y.o.   MRN: [id number]  HPIsee last OV-no chg w/ Wanda Green tx Pt presents today with chronic pain in her neck. She has been using the rolled towel approach for help. Previous x rays done for her cervical spine. Results show mild loss of disc height at C4-C5  with moderate loss of disc height at C5-C6 and mild loss of disc  height at C6-C7. Facet spurring is greatest on the right at C4-C5 She states she wouldn't mind trying steroids if necessary. She is going to try taking aleve.  She also states she is claustrophobic with MRI so hopes to avoid.  No radic sxt or weakness  Review of Systems     Objective:   Physical Exam BP 142/78  Pulse 75  Temp(Src) 97.5 F (36.4 C) (Oral)  Resp 18  Ht 5\' 3"  (1.6 m)  Wt 175 lb (79.379 kg)  BMI 31.01 kg/m2  SpO2 95% Neck w/ decr rom and tend to palp L paracerv areas No neuro losses distally       Assessment & Plan:  Degen cerv dz  Meds ordered this encounter  Medications  . predniSONE (DELTASONE) 20 MG tablet    Sig: Take 1 tablet (20 mg total) by mouth daily. 3/3/2/2/1/1 single daily dose for 6 days    Dispense:  12 tablet    Refill:  0  alleve  If fails will call for ortho referral next

## 2012-12-03 ENCOUNTER — Telehealth: Payer: Self-pay

## 2012-12-03 MED ORDER — PREDNISONE 20 MG PO TABS: 20.0000 mg | ORAL_TABLET | Freq: Every day | ORAL | Status: DC

## 2012-12-03 NOTE — Telephone Encounter (Signed)
Pt is calling to let Dr Merla Riches know that the Prednisone is doing great and she feels that one more round of it will do even even better, so she is wanting a refill on the Prednisone  Pharmacy is [facility] Call back number  Is [phone]

## 2012-12-03 NOTE — Telephone Encounter (Signed)
Thanks I called her to advise.  

## 2012-12-03 NOTE — Telephone Encounter (Signed)
Meds ordered this encounter  Medications  . predniSONE (DELTASONE) 20 MG tablet    Sig: Take 1 tablet (20 mg total) by mouth daily. 3/3/2/2/1/1 single daily dose for 6 days    Dispense:  12 tablet    Refill:  0

## 2013-01-08 ENCOUNTER — Other Ambulatory Visit: Payer: Self-pay

## 2013-01-16 ENCOUNTER — Telehealth: Payer: Self-pay | Admitting: *Deleted

## 2013-01-16 MED ORDER — SOLIFENACIN SUCCINATE 10 MG PO TABS: 10.0000 mg | ORAL_TABLET | Freq: Every day | ORAL | Status: DC

## 2013-01-16 NOTE — Telephone Encounter (Signed)
Pt said pharmacy never received her Rx for VESIcare 10 mg on 05/29/12 , I will send Rx in again for pt.

## 2013-02-02 HISTORY — PX: NASAL SEPTUM SURGERY: SHX37

## 2013-02-02 HISTORY — PX: NASAL SINUS SURGERY: SHX719

## 2013-03-02 DIAGNOSIS — E669 Obesity, unspecified: Secondary | ICD-10-CM | POA: Insufficient documentation

## 2013-03-09 DIAGNOSIS — B49 Unspecified mycosis: Secondary | ICD-10-CM | POA: Insufficient documentation

## 2013-05-03 ENCOUNTER — Encounter: Payer: Self-pay | Admitting: Oncology

## 2013-05-07 ENCOUNTER — Encounter: Payer: Self-pay | Admitting: Gynecology

## 2013-06-02 ENCOUNTER — Other Ambulatory Visit: Payer: Medicare Other

## 2013-06-09 ENCOUNTER — Ambulatory Visit: Payer: Medicare Other | Admitting: Oncology

## 2013-06-22 ENCOUNTER — Encounter: Payer: Self-pay | Admitting: Oncology

## 2013-06-22 ENCOUNTER — Ambulatory Visit (INDEPENDENT_AMBULATORY_CARE_PROVIDER_SITE_OTHER): Payer: Medicare HMO | Admitting: Oncology

## 2013-06-22 VITALS — BP 143/79 | HR 75 | Temp 97.9°F | Ht 63.5 in | Wt 179.5 lb

## 2013-06-22 DIAGNOSIS — C50919 Malignant neoplasm of unspecified site of unspecified female breast: Secondary | ICD-10-CM

## 2013-06-22 DIAGNOSIS — I10 Essential (primary) hypertension: Secondary | ICD-10-CM

## 2013-06-22 DIAGNOSIS — D649 Anemia, unspecified: Secondary | ICD-10-CM

## 2013-06-22 DIAGNOSIS — I4891 Unspecified atrial fibrillation: Secondary | ICD-10-CM

## 2013-06-22 DIAGNOSIS — R944 Abnormal results of kidney function studies: Secondary | ICD-10-CM

## 2013-06-22 LAB — CBC WITH DIFFERENTIAL/PLATELET
Basophils Absolute: 0 10*3/uL (ref 0.0–0.1)
Basophils Relative: 0 % (ref 0–1)
EOS ABS: 0.1 10*3/uL (ref 0.0–0.7)
EOS PCT: 1 % (ref 0–5)
HCT: 33.1 % — ABNORMAL LOW (ref 36.0–46.0)
HEMOGLOBIN: 11.2 g/dL — AB (ref 12.0–15.0)
LYMPHS ABS: 1 10*3/uL (ref 0.7–4.0)
Lymphocytes Relative: 12 % (ref 12–46)
MCH: 29.4 pg (ref 26.0–34.0)
MCHC: 33.8 g/dL (ref 30.0–36.0)
MCV: 86.9 fL (ref 78.0–100.0)
MONO ABS: 0.7 10*3/uL (ref 0.1–1.0)
MONOS PCT: 8 % (ref 3–12)
Neutro Abs: 6.7 10*3/uL (ref 1.7–7.7)
Neutrophils Relative %: 79 % — ABNORMAL HIGH (ref 43–77)
PLATELETS: 246 10*3/uL (ref 150–400)
RBC: 3.81 MIL/uL — AB (ref 3.87–5.11)
RDW: 14.8 % (ref 11.5–15.5)
WBC: 8.5 10*3/uL (ref 4.0–10.5)

## 2013-06-22 LAB — COMPREHENSIVE METABOLIC PANEL
ALK PHOS: 55 U/L (ref 39–117)
ALT: 13 U/L (ref 0–35)
AST: 15 U/L (ref 0–37)
Albumin: 4.5 g/dL (ref 3.5–5.2)
BILIRUBIN TOTAL: 0.5 mg/dL (ref 0.2–1.2)
BUN: 32 mg/dL — ABNORMAL HIGH (ref 6–23)
CO2: 23 mEq/L (ref 19–32)
Calcium: 9.8 mg/dL (ref 8.4–10.5)
Chloride: 104 mEq/L (ref 96–112)
Creat: 1.19 mg/dL — ABNORMAL HIGH (ref 0.50–1.10)
GLUCOSE: 91 mg/dL (ref 70–99)
Potassium: 4.2 mEq/L (ref 3.5–5.3)
SODIUM: 138 meq/L (ref 135–145)
TOTAL PROTEIN: 7.3 g/dL (ref 6.0–8.3)

## 2013-06-22 NOTE — Progress Notes (Signed)
Patient ID: Wanda Green, female   DOB: 1934-09-14, 79 y.o.   MRN: [id number] Hematology and Oncology Follow Up Visit  Wanda Green [id number] 1934-12-19 79 y.o. 06/22/2013 10:10 AM   Principle Diagnosis: Encounter Diagnoses  Name Primary?  . HTN (hypertension)   . Atrial fibrillation   . Breast cancer, stage 2 Yes     Interim History:   Follow-up visit for this pleasant 79 year old woman with a history of stage II, 2-node positive, ER positive, cancer of the right breast initially diagnosed in January 2000, treated with lumpectomy, breast radiation, four cycles of Adriamycin, Cytoxan and Taxotere chemotherapy, five years of tamoxifen, and one year of Arimidex.  Overall she is doing well since last visit. No more episodes of colitis. She apparently developed acute atrial fibrillation at time of hospitalization for acute "ischemic" and colitis in February of 2014. She is now on amiodarone as well as an anticoagulant. She initially started on apixiban. She had some problems with this medication and was put on Warfarin. She developed a number of large hematomas at sites of minor trauma and was switched back to the [place]. Mammograms on 12/29/2013 show no new abnormalities. She has a scheduled visit with her gynecologist later this month. She's had no interim vaginal bleeding.  When I saw her last year I was concerned with her creatinine. I ordered a 24-hour urine for creatinine clearance. This was canceled by her internist. Creatinine done through his office was back to her baseline.   Medications: reviewed  Allergies:  Allergies  Allergen Reactions  . Codeine   . Oxycodone-Acetaminophen     Review of Systems: Hematology:  See above ENT ROS: No sore throat Breast ROS: No new lumps Respiratory ROS: No cough or dyspnea Cardiovascular ROS:  No ischemic type chest pain or palpitations Gastrointestinal ROS: No recurrent abdominal pain or diarrhea   Genito-Urinary ROS: No  urinary tract symptoms Musculoskeletal ROS: No muscle bone or joint pain Neurological ROS: No headache or change in vision  Dermatological ROS: No rash or ecchymosis Remaining ROS negative:   Physical Exam: Blood pressure 143/79, pulse 75, temperature 97.9 F (36.6 C), temperature source Oral, height 5' 3.5" (1.613 m), weight 179 lb 8 oz (81.421 kg), SpO2 98.00%. Wt Readings from Last 3 Encounters:  06/22/13 179 lb 8 oz (81.421 kg)  11/28/12 175 lb (79.379 kg)  09/26/12 165 lb 3.2 oz (74.934 kg)     General appearance: Well-nourished Caucasian woman HENNT: Pharynx no erythema, exudate, mass, or ulcer. No thyromegaly or thyroid nodules Lymph nodes: No cervical, supraclavicular, or axillary lymphadenopathy Breasts: No abnormal skin changes, no dominant mass in either breast. Scar in right breast from previous lumpectomy. Lungs: Clear to auscultation, resonant to percussion throughout Heart: Regular rhythm, no murmur, no gallop, no rub, no click, no edema Abdomen: Soft, nontender, normal bowel sounds, no mass, no organomegaly Extremities: No edema, no calf tenderness Musculoskeletal: no joint deformities GU:  Vascular: Carotid pulses 2+, no bruits, distal pulses: Neurologic: Alert, oriented, PERRLA, cranial nerves grossly normal, motor strength 5 over 5, reflexes 1+ symmetric, upper body coordination normal, gait normal, Skin: No rash or ecchymosis  Lab Results: Pending CBC W/Diff    Component Value Date/Time   WBC 5.3 06/10/2012 1327   WBC 9.8 04/12/2012 0545   RBC 3.60* 06/10/2012 1327   RBC 3.27* 04/12/2012 0545   HGB 11.0* 06/10/2012 1327   HGB 10.3* 04/12/2012 0545   HCT 33.8* 06/10/2012 1327   HCT 30.0* 04/12/2012 0545  PLT 217 06/10/2012 1327   PLT 199 04/12/2012 0545   MCV 93.9 06/10/2012 1327   MCV 91.7 04/12/2012 0545   MCH 30.6 06/10/2012 1327   MCH 31.5 04/12/2012 0545   MCHC 32.5 06/10/2012 1327   MCHC 34.3 04/12/2012 0545   RDW 13.3 06/10/2012 1327   RDW 12.7 04/12/2012 0545   LYMPHSABS  1.6 06/10/2012 1327   LYMPHSABS 1.0 04/09/2012 0948   MONOABS 0.5 06/10/2012 1327   MONOABS 1.1* 04/09/2012 0948   EOSABS 0.1 06/10/2012 1327   EOSABS 0.0 04/09/2012 0948   BASOSABS 0.0 06/10/2012 1327   BASOSABS 0.0 04/09/2012 0948     Chemistry      Component Value Date/Time   NA 138 06/10/2012 1327   NA 140 06/03/2012 1139   K 4.5 06/10/2012 1327   K 4.7 06/03/2012 1139   CL 103 06/10/2012 1327   CL 105 06/03/2012 1139   CO2 27 06/10/2012 1327   CO2 25 06/03/2012 1139   BUN 29* 06/10/2012 1327   BUN 28.0* 06/03/2012 1139   CREATININE 1.43* 06/10/2012 1327   CREATININE 1.5* 06/03/2012 1139      Component Value Date/Time   CALCIUM 9.9 06/10/2012 1327   CALCIUM 9.4 06/03/2012 1139   ALKPHOS 47 06/10/2012 1327   ALKPHOS 70 06/03/2012 1139   AST 14 06/10/2012 1327   AST 15 06/03/2012 1139   ALT 12 06/10/2012 1327   ALT 14 06/03/2012 1139   BILITOT 0.5 06/10/2012 1327   BILITOT 0.32 06/03/2012 1139       Radiological Studies: See discussion above   Impression:  #1. Stage II ER positive breast cancer treated as outlined above No evidence for new disease now out over 15 years from diagnosis. She will continue annual mammograms and exam.  #2. Mild chronic normochromic anemia Serum immunoglobulins normal when checked last year and no monoclonal proteins on IFE.  #3. History of recurrent colitis  #4. Recent diagnosis atrial fibrillation controlled on medication. Now on chronic anticoagulation.  #5. Essential hypertension  #6. Elevated creatinine Repeat lab today pending    CC: Patient Care Team: Leandrew Koyanagi, MD as PCP - General (Family Medicine)   Annia Belt, MD 4/20/201510:10 AM

## 2013-06-22 NOTE — Patient Instructions (Signed)
Lab here today Return visit in 1 year  In March, 2016 Mammograms at [facility] on May 03, 2014

## 2013-06-24 ENCOUNTER — Telehealth: Payer: Self-pay | Admitting: *Deleted

## 2013-06-24 NOTE — Telephone Encounter (Signed)
Called pt - informed pt kidney function improved as compared w/last year per Dr Beryle Beams and he will forward result to Dr Terrence Dupont and let him decide if he wants to do urine collection. She understands.

## 2013-06-24 NOTE — Telephone Encounter (Signed)
Message copied by Ebbie Latus on Wed Jun 24, 2013  8:58 AM ------      Message from: Annia Belt      Created: Tue Jun 23, 2013 10:01 AM       Call pt: kidney function improved compared with last year. I will forward copy to Dr Terrence Dupont and he can decide if he wants you to do the urine collection ------

## 2013-07-23 ENCOUNTER — Other Ambulatory Visit: Payer: Self-pay | Admitting: Gynecology

## 2013-09-25 ENCOUNTER — Ambulatory Visit (INDEPENDENT_AMBULATORY_CARE_PROVIDER_SITE_OTHER): Payer: Medicare HMO | Admitting: Gynecology

## 2013-09-25 ENCOUNTER — Encounter: Payer: Self-pay | Admitting: Gynecology

## 2013-09-25 VITALS — BP 130/76 | Ht 63.0 in | Wt 176.0 lb

## 2013-09-25 DIAGNOSIS — N318 Other neuromuscular dysfunction of bladder: Secondary | ICD-10-CM

## 2013-09-25 DIAGNOSIS — M810 Age-related osteoporosis without current pathological fracture: Secondary | ICD-10-CM

## 2013-09-25 DIAGNOSIS — N952 Postmenopausal atrophic vaginitis: Secondary | ICD-10-CM

## 2013-09-25 DIAGNOSIS — N3281 Overactive bladder: Secondary | ICD-10-CM

## 2013-09-25 MED ORDER — SOLIFENACIN SUCCINATE 10 MG PO TABS: ORAL_TABLET | ORAL | Status: DC

## 2013-09-25 MED ORDER — FLUCONAZOLE 150 MG PO TABS: 150.0000 mg | ORAL_TABLET | Freq: Once | ORAL | Status: DC

## 2013-09-25 NOTE — Patient Instructions (Signed)
Office will contact you about starting on Prolia which is the shot for osteoporosis. If you do not hear from [place] in 2 weeks call. Followup in one year, sooner as needed.  You may obtain a copy of any labs that were done today by logging onto MyChart as outlined in the instructions provided with your AVS (after visit summary). The office will not call with normal lab results but certainly if there are any significant abnormalities then we will contact you.   Health Maintenance, Female A healthy lifestyle and preventative care can promote health and wellness.  Maintain regular health, dental, and eye exams.  Eat a healthy diet. Foods like vegetables, fruits, whole grains, low-fat dairy products, and lean protein foods contain the nutrients you need without too many calories. Decrease your intake of foods high in solid fats, added sugars, and salt. Get information about a proper diet from your caregiver, if necessary.  Regular physical exercise is one of the most important things you can do for your health. Most adults should get at least 150 minutes of moderate-intensity exercise (any activity that increases your heart rate and causes you to sweat) each week. In addition, most adults need muscle-strengthening exercises on 2 or more days a week.   Maintain a healthy weight. The body mass index (BMI) is a screening tool to identify possible weight problems. It provides an estimate of body fat based on height and weight. Your caregiver can help determine your BMI, and can help you achieve or maintain a healthy weight. For adults 20 years and older:  A BMI below 18.5 is considered underweight.  A BMI of 18.5 to 24.9 is normal.  A BMI of 25 to 29.9 is considered overweight.  A BMI of 30 and above is considered obese.  Maintain normal blood lipids and cholesterol by exercising and minimizing your intake of saturated fat. Eat a balanced diet with plenty of fruits and vegetables. Blood tests for lipids  and cholesterol should begin at age 36 and be repeated every 5 years. If your lipid or cholesterol levels are high, you are over 50, or you are a high risk for heart disease, you may need your cholesterol levels checked more frequently.Ongoing high lipid and cholesterol levels should be treated with medicines if diet and exercise are not effective.  If you smoke, find out from your caregiver how to quit. If you do not use tobacco, do not start.  Lung cancer screening is recommended for adults aged 47 80 years who are at high risk for developing lung cancer because of a history of smoking. Yearly low-dose computed tomography (CT) is recommended for people who have at least a 30-pack-year history of smoking and are a current smoker or have quit within the past 15 years. A pack year of smoking is smoking an average of 1 pack of cigarettes a day for 1 year (for example: 1 pack a day for 30 years or 2 packs a day for 15 years). Yearly screening should continue until the smoker has stopped smoking for at least 15 years. Yearly screening should also be stopped for people who develop a health problem that would prevent them from having lung cancer treatment.  If you are pregnant, do not drink alcohol. If you are breastfeeding, be very cautious about drinking alcohol. If you are not pregnant and choose to drink alcohol, do not exceed 1 drink per day. One drink is considered to be 12 ounces (355 mL) of beer, 5 ounces (148  mL) of wine, or 1.5 ounces (44 mL) of liquor.  Avoid use of street drugs. Do not share needles with anyone. Ask for help if you need support or instructions about stopping the use of drugs.  High blood pressure causes heart disease and increases the risk of stroke. Blood pressure should be checked at least every 1 to 2 years. Ongoing high blood pressure should be treated with medicines, if weight loss and exercise are not effective.  If you are 46 to 77 years old, ask your caregiver if you  should take aspirin to prevent strokes.  Diabetes screening involves taking a blood sample to check your fasting blood sugar level. This should be done once every 3 years, after age 61, if you are within normal weight and without risk factors for diabetes. Testing should be considered at a younger age or be carried out more frequently if you are overweight and have at least 1 risk factor for diabetes.  Breast cancer screening is essential preventative care for women. You should practice "breast self-awareness." This means understanding the normal appearance and feel of your breasts and may include breast self-examination. Any changes detected, no matter how small, should be reported to a caregiver. Women in their 89s and 30s should have a clinical breast exam (CBE) by a caregiver as part of a regular health exam every 1 to 3 years. After age 62, women should have a CBE every year. Starting at age 67, women should consider having a mammogram (breast X-ray) every year. Women who have a family history of breast cancer should talk to their caregiver about genetic screening. Women at a high risk of breast cancer should talk to their caregiver about having an MRI and a mammogram every year.  Breast cancer gene (BRCA)-related cancer risk assessment is recommended for women who have family members with BRCA-related cancers. BRCA-related cancers include breast, ovarian, tubal, and peritoneal cancers. Having family members with these cancers may be associated with an increased risk for harmful changes (mutations) in the breast cancer genes BRCA1 and BRCA2. Results of the assessment will determine the need for genetic counseling and BRCA1 and BRCA2 testing.  The Pap test is a screening test for cervical cancer. Women should have a Pap test starting at age 68. Between ages 11 and 77, Pap tests should be repeated every 2 years. Beginning at age 27, you should have a Pap test every 3 years as long as the past 3 Pap tests  have been normal. If you had a hysterectomy for a problem that was not cancer or a condition that could lead to cancer, then you no longer need Pap tests. If you are between ages 69 and 20, and you have had normal Pap tests going back 10 years, you no longer need Pap tests. If you have had past treatment for cervical cancer or a condition that could lead to cancer, you need Pap tests and screening for cancer for at least 20 years after your treatment. If Pap tests have been discontinued, risk factors (such as a new sexual partner) need to be reassessed to determine if screening should be resumed. Some women have medical problems that increase the chance of getting cervical cancer. In these cases, your caregiver may recommend more frequent screening and Pap tests.  The human papillomavirus (HPV) test is an additional test that may be used for cervical cancer screening. The HPV test looks for the virus that can cause the cell changes on the cervix. The cells  collected during the Pap test can be tested for HPV. The HPV test could be used to screen women aged 11 years and older, and should be used in women of any age who have unclear Pap test results. After the age of 65, women should have HPV testing at the same frequency as a Pap test.  Colorectal cancer can be detected and often prevented. Most routine colorectal cancer screening begins at the age of 50 and continues through age 60. However, your caregiver may recommend screening at an earlier age if you have risk factors for colon cancer. On a yearly basis, your caregiver may provide home test kits to check for hidden blood in the stool. Use of a small camera at the end of a tube, to directly examine the colon (sigmoidoscopy or colonoscopy), can detect the earliest forms of colorectal cancer. Talk to your caregiver about this at age 86, when routine screening begins. Direct examination of the colon should be repeated every 5 to 10 years through age 94, unless  early forms of pre-cancerous polyps or small growths are found.  Hepatitis C blood testing is recommended for all people born from [birth year] through 1963 and any individual with known risks for hepatitis C.  Practice safe sex. Use condoms and avoid high-risk sexual practices to reduce the spread of sexually transmitted infections (STIs). Sexually active women aged 28 and younger should be checked for Chlamydia, which is a common sexually transmitted infection. Older women with new or multiple partners should also be tested for Chlamydia. Testing for other STIs is recommended if you are sexually active and at increased risk.  Osteoporosis is a disease in which the bones lose minerals and strength with aging. This can result in serious bone fractures. The risk of osteoporosis can be identified using a bone density scan. Women ages 59 and over and women at risk for fractures or osteoporosis should discuss screening with their caregivers. Ask your caregiver whether you should be taking a calcium supplement or vitamin D to reduce the rate of osteoporosis.  Menopause can be associated with physical symptoms and risks. Hormone replacement therapy is available to decrease symptoms and risks. You should talk to your caregiver about whether hormone replacement therapy is right for you.  Use sunscreen. Apply sunscreen liberally and repeatedly throughout the day. You should seek shade when your shadow is shorter than you. Protect yourself by wearing long sleeves, pants, a wide-brimmed hat, and sunglasses year round, whenever you are outdoors.  Notify your caregiver of new moles or changes in moles, especially if there is a change in shape or color. Also notify your caregiver if a mole is larger than the size of a pencil eraser.  Stay current with your immunizations. Document Released: 09/04/2010 Document Revised: 06/16/2012 Document Reviewed: 09/04/2010 [facility] Patient Information 2014 [place].

## 2013-09-25 NOTE — Progress Notes (Signed)
Wanda Green Oct 03, 1934 [id number]        79 y.o.  G3P3003 for followup exam. Several issues noted below.  Past medical history,surgical history, problem list, medications, allergies, family history and social history were all reviewed and documented as reviewed in the EPIC chart.  ROS:  12 system ROS performed with pertinent positives and negatives included in the history, assessment and plan.   Additional significant findings :  None   Exam: Wanda Green Filed Vitals:   09/25/13 1100  BP: 130/76  Height: 5\' 3"  (1.6 m)  Weight: 176 lb (79.833 kg)   General appearance:  Normal affect, orientation and appearance. Skin: Grossly normal HEENT: Without gross lesions.  No cervical or supraclavicular adenopathy. Thyroid normal.  Lungs:  Clear without wheezing, rales or rhonchi Cardiac: RR, without RMG Abdominal:  Soft, nontender, without masses, guarding, rebound, organomegaly or hernia Breasts:  Examined lying and sitting. Left without masses, retractions, discharge or axillary adenopathy. Right status post lumpectomy changes. No masses retractions discharge adenopathy Pelvic:  Ext/BUS/vagina with generalized atrophic changes  Cervix atrophic  Uterus axial to anteverted, normal size, shape and contour, midline and mobile nontender   Adnexa  Without masses or tenderness    Anus and perineum  Normal   Rectovaginal  Normal sphincter tone without palpated masses or tenderness.    Assessment/Plan:  79 y.o. G3P3003 female for followup exam.   1. Postmenopausal/atrophic genital changes. Patient doing well without significant symptoms of hot flushes, night sweats, vaginal dryness. Is not sexually active. No vaginal bleeding. Continue to monitor. Call if any vaginal bleeding. 2. Detrusor instability. Using VESIcare 10 mg daily doing well and wants to continue. I refilled her x1 year. Check urinalysis today. 3. Osteoporosis. DEXA 06/2012 T score -2.5. Started on Fosamax but had acceptable  GERD and musculoskeletal pain. Stop this. Reviewed options with her. Ultimately we decided to initiate Prolia. Risks reviewed to include those discussed with Fosamax as well as rash and infections. We'll go ahead and precertified. Patient knows to call if she has not heard from our office within 2 weeks. 4. Pap smear 2011. No Pap smear done today. History of cone biopsy at age 45 with normal Pap smears since then. We both agreed to stop screening per current screening guidelines issues over the age of 77 and greater than 20 years out from her abnormal Pap smears. 5. History of breast cancer. Exam today NED. Mammography 04/2013. Continue with annual mammography. SBE monthly reviewed. 6. Colonoscopy 2 years ago. Repeat at their recommended interval. 7. Health Maintenance. No blood work done as she has this done at her primary physician's office. Followup one year, sooner as needed.   Note: This document was prepared with digital dictation and possible smart phrase technology. Any transcriptional errors that result from this process are unintentional.   Anastasio Auerbach MD, 11:36 AM 09/25/2013

## 2013-09-25 NOTE — Addendum Note (Signed)
Addended by: Alen Blew on: 09/25/2013 12:46 PM   Modules accepted: Orders

## 2013-09-26 LAB — URINALYSIS W MICROSCOPIC + REFLEX CULTURE
BACTERIA UA: NONE SEEN
BILIRUBIN URINE: NEGATIVE
CASTS: NONE SEEN
Crystals: NONE SEEN
GLUCOSE, UA: NEGATIVE mg/dL
HGB URINE DIPSTICK: NEGATIVE
KETONES UR: NEGATIVE mg/dL
Leukocytes, UA: NEGATIVE
Nitrite: NEGATIVE
PH: 5.5 (ref 5.0–8.0)
Protein, ur: NEGATIVE mg/dL
Specific Gravity, Urine: 1.011 (ref 1.005–1.030)
Squamous Epithelial / LPF: NONE SEEN
Urobilinogen, UA: 0.2 mg/dL (ref 0.0–1.0)

## 2013-09-29 ENCOUNTER — Telehealth: Payer: Self-pay | Admitting: *Deleted

## 2013-09-29 NOTE — Telephone Encounter (Signed)
Prolia benefits requested KW CMA

## 2013-10-07 NOTE — Telephone Encounter (Signed)
Prior authorization process started [facility] CMA

## 2013-11-13 ENCOUNTER — Ambulatory Visit (INDEPENDENT_AMBULATORY_CARE_PROVIDER_SITE_OTHER): Payer: Medicare HMO | Admitting: Pulmonary Disease

## 2013-11-13 ENCOUNTER — Encounter: Payer: Self-pay | Admitting: Pulmonary Disease

## 2013-11-13 VITALS — BP 116/74 | HR 71 | Temp 97.4°F | Ht 63.5 in | Wt 177.8 lb

## 2013-11-13 DIAGNOSIS — G4733 Obstructive sleep apnea (adult) (pediatric): Secondary | ICD-10-CM

## 2013-11-13 NOTE — Patient Instructions (Signed)
Please bring your card by for download for the last 30mos. Keep working on weight loss I will call you once download is done. followup with me in one year.

## 2013-11-13 NOTE — Progress Notes (Signed)
   Subjective:    Patient ID: Wanda Green, female    DOB: 1934-08-02, 79 y.o.   MRN: [id number]  HPI Patient comes in today for followup of her known mild obstructive sleep apnea. She has been started on CPAP, and clearly sees a difference in her sleep and daytime sleepiness since being on the device. She uses nasal pillows, but is unsure if she has mouth opening. Despite being on CPAP, she still has some sleepiness during the day.   Review of Systems  Constitutional: Negative for fever and unexpected weight change.  HENT: Negative for congestion, dental problem, ear pain, nosebleeds, postnasal drip, rhinorrhea, sinus pressure, sneezing, sore throat and trouble swallowing.   Eyes: Negative for redness and itching.  Respiratory: Negative for cough, chest tightness, shortness of breath and wheezing.   Cardiovascular: Negative for palpitations and leg swelling.  Gastrointestinal: Negative for nausea and vomiting.  Genitourinary: Negative for dysuria.  Musculoskeletal: Negative for joint swelling.  Skin: Negative for rash.  Neurological: Negative for headaches.  Hematological: Does not bruise/bleed easily.  Psychiatric/Behavioral: Negative for dysphoric mood. The patient is not nervous/anxious.        Objective:   Physical Exam Overweight female in no acute distress Nose without purulence or discharge noted Neck without lymphadenopathy or thyromegaly No skin breakdown or pressure necrosis from the CPAP mask Lower extremities with mild edema and varicosities, no cyanosis Alert and oriented, does not appear to be sleepy, moves all 4 extremities.       Assessment & Plan:

## 2013-11-13 NOTE — Assessment & Plan Note (Signed)
The patient is clearly seen improvement in her sleep and daytime alertness with CPAP, but she is having persistent daytime symptoms. It is unclear whether this is related to her pressure, whether this may be some type of environmental factor, or whether it has nothing to do with her sleep. She has no history consistent with RLS, but she does use nasal pillows and may be having some mouth opening. At this point, I think we need to get a download off her device for evaluation. I've also encouraged her work aggressively on weight loss.

## 2013-11-20 NOTE — Telephone Encounter (Signed)
PA approved Auth # [id number] good from 10/27/13 to 10/27/14 per Leafy Ro in [employer]. Pt will be responsible for $200 (20%) and insurance pays 80% til out of pocket max met. Pt informed and will think about it and let me know. KW CMA

## 2013-11-27 ENCOUNTER — Telehealth: Payer: Self-pay | Admitting: Pulmonary Disease

## 2013-11-27 NOTE — Telephone Encounter (Signed)
Download done and placed in [facility] folder for look at.  Called pt and she asked for card to be mailed to her (confirmed in epic). i have done so. Nothing further needed

## 2013-12-02 NOTE — Telephone Encounter (Signed)
Please let pt know that her download shows breakthru apnea at her current cpap setting, and I suspect that her pressure may not be adequate.  She also is having a LOT of mask leak, and needs to work on fit with dme  Please send order to dme to change her pressure to auto at 5-20cm, and to work with pt on mask fit.  She is to call us if she continues to feel unrested in the mornings after wearing cpap.

## 2013-12-02 NOTE — Telephone Encounter (Signed)
LMTCB x 1 

## 2013-12-03 NOTE — Telephone Encounter (Signed)
lmomtcb x 2  

## 2013-12-04 ENCOUNTER — Telehealth: Payer: Self-pay | Admitting: Pulmonary Disease

## 2013-12-04 DIAGNOSIS — G4733 Obstructive sleep apnea (adult) (pediatric): Secondary | ICD-10-CM

## 2013-12-04 NOTE — Telephone Encounter (Signed)
Wanda Delton, MD at 12/02/2013 8:38 AM     Status: Signed        Please let pt know that her download shows breakthru apnea at her current cpap setting, and I suspect that her pressure may not be adequate. She also is having a LOT of mask leak, and needs to work on fit with dme  Please send order to dme to change her pressure to auto at 5-20cm, and to work with pt on mask fit. She is to call us if she continues to feel unrested in the mornings after wearing cpap.    I spoke with patient about results and she verbalized understanding and had no questions Order placed. Nothing further needed

## 2013-12-18 ENCOUNTER — Other Ambulatory Visit: Payer: Self-pay

## 2014-01-04 ENCOUNTER — Encounter: Payer: Self-pay | Admitting: Pulmonary Disease

## 2014-01-11 ENCOUNTER — Telehealth: Payer: Self-pay | Admitting: *Deleted

## 2014-01-11 NOTE — Telephone Encounter (Signed)
Pt called sating she has reached the donut hole for vesricare and medication is now $140. Pt asked if another generic medication can be prescribed? Please advise

## 2014-01-12 MED ORDER — OXYBUTYNIN CHLORIDE 5 MG PO TABS: 5.0000 mg | ORAL_TABLET | Freq: Every day | ORAL | Status: DC

## 2014-01-12 NOTE — Telephone Encounter (Signed)
Okay for oxybutynin 5 mg extended release #30 one by mouth daily, call me at the end of the 30 days to let me know how she's doing before refill.

## 2014-01-12 NOTE — Telephone Encounter (Signed)
Pt asked if she could get oxybutynin in pill form rather than patch?

## 2014-01-12 NOTE — Telephone Encounter (Signed)
She could try the over-the-counter patch Oxytrol to see if that doesn't help. I do not believe Enablex Vesicare or other overactive bladder medications are generic.

## 2014-01-12 NOTE — Telephone Encounter (Signed)
Rx sent 

## 2014-01-21 ENCOUNTER — Telehealth: Payer: Self-pay | Admitting: *Deleted

## 2014-01-21 MED ORDER — OXYBUTYNIN CHLORIDE ER 10 MG PO TB24
10.0000 mg | ORAL_TABLET | Freq: Every day | ORAL | Status: DC
Start: 2014-01-21 — End: 2014-02-18

## 2014-01-21 NOTE — Telephone Encounter (Signed)
Can increase to 10 mg oxybutynin

## 2014-01-21 NOTE — Telephone Encounter (Signed)
Pt informed, rx sent 

## 2014-01-21 NOTE — Telephone Encounter (Signed)
Pt was told to call back in 30 days from 01/11/14 after trying oxybutyrin 5 mg ER daily. It has not been fully 30 days and pt feels Rx is note working at 5 mg asked if increase could be given? Pt switched to this due to versicare being to expensive. Please advise

## 2014-02-18 ENCOUNTER — Other Ambulatory Visit: Payer: Self-pay | Admitting: Gynecology

## 2014-03-05 DIAGNOSIS — I219 Acute myocardial infarction, unspecified: Secondary | ICD-10-CM

## 2014-03-05 DIAGNOSIS — I499 Cardiac arrhythmia, unspecified: Secondary | ICD-10-CM

## 2014-03-05 HISTORY — DX: Acute myocardial infarction, unspecified: I21.9

## 2014-03-05 HISTORY — DX: Cardiac arrhythmia, unspecified: I49.9

## 2014-03-23 ENCOUNTER — Ambulatory Visit (INDEPENDENT_AMBULATORY_CARE_PROVIDER_SITE_OTHER): Payer: PPO

## 2014-03-23 ENCOUNTER — Ambulatory Visit (INDEPENDENT_AMBULATORY_CARE_PROVIDER_SITE_OTHER): Payer: PPO | Admitting: Sports Medicine

## 2014-03-23 VITALS — BP 166/80 | HR 67 | Temp 98.4°F | Resp 16 | Ht 62.5 in | Wt 177.8 lb

## 2014-03-23 DIAGNOSIS — M25511 Pain in right shoulder: Secondary | ICD-10-CM

## 2014-03-23 DIAGNOSIS — S43421A Sprain of right rotator cuff capsule, initial encounter: Secondary | ICD-10-CM

## 2014-03-23 MED ORDER — MELOXICAM 15 MG PO TABS: ORAL_TABLET | ORAL | Status: DC

## 2014-03-23 MED ORDER — TRAMADOL HCL 50 MG PO TABS: 50.0000 mg | ORAL_TABLET | Freq: Two times a day (BID) | ORAL | Status: DC | PRN

## 2014-03-23 NOTE — Progress Notes (Signed)
Patient ID: Wanda Green, female   DOB: 29-Jul-1934, 80 y.o.   MRN: [id number]  Wanda Green - 80 y.o. female MRN [id number]  Date of birth: 01/27/1935  SUBJECTIVE:  Including CC & ROS.  Patient is a 80 year old female who presents today with acute right shoulder pain. Patient reports that approximately 5 days ago she was moving firewood from a shed into her house which is increase activity than her baseline activity. The next day she was having soreness by Sunday she was having severe pain the cause decreased range of motion and difficulty lifting her arm overhead. She was unable to sleep on Sunday and Monday night. Difficulty with overhead activities. Dressing herself. Patient attempted over-the-counter anti-inflammatories with NSAIDs with no significant relief. Patient denies any falls or acute injury to her shoulder in the past.  ROS:  Constitutional:  No fever, chills, or fatigue.  Respiratory:  No shortness of breath, cough, or wheezing Cardiovascular:  No palpitations, chest pain or syncope Gastrointestinal:  No nausea, no abdominal pain Review of systems otherwise negative except for what is stated in HPI  HISTORY: Past Medical, Surgical, Social, and Family History Reviewed & Updated per EMR. Pertinent Historical Findings include: Past medical history significant for  atrial fibrillation on chronic anticoagulation therapy Hypertension Obstructive sleep apnea Hypertension Stage II breast cancer  PHYSICAL EXAM:  VS: BP:(!) 166/80 mmHg  HR:67bpm  TEMP:98.4 F (36.9 C)(Oral)  RESP:100 %  HT:5' 2.5" (158.8 cm)   WT:177 lb 12.8 oz (80.65 kg)  BMI:32.1 SHOULDER EXAM:  General: well nourished Skin of UE: warm; dry, no rashes, lesions, ecchymosis or erythema. Vascular: radial pulses 2+ bilaterally Neurologically: Normal sensation with no sensory or motor defects in C4-C8, bilateral Palpation: no tenderness over the [facility] joint, acromion, no bicipital grove tenderness, no supraspinatus  tenderness ROM active/passive: Range of motion the right shoulder is significantly decreased with forward flexion abduction approximately 45. Internal rotation symmetric at L4. Internal rotation symmetric at 0. Left shoulder has full range of motion. Attempted passive range of motion with no significant improvement in range given the patient's pain Strength testing:Significant pain and poor exam quality given pain unable to assess patient's range of motion strength or special tests. Positive arm drop currently.   Injection procedure: Consent obtained and verified. Sterile betadine prep. Furthur cleansed with alcohol and betadine. Topical analgesic spray: Ethyl chloride. Injection Indication: Rotator cuff tendinitis Approached in typical fashion with: Posterior lateral right shoulder  Meds: Half cc of 80 mg Depo-Medrol along with 4 mL of 1% lidocaine  Needle: 25-gauge 1-1/2 inch needle Completed without difficulty Aftercare instructions and Red flags advised. Advised to call if fevers/chills, erythema, induration, drainage, or persistent bleeding.  Clinical exam following injection therapy showed improve range of motion to about 90 and improve strength with resolution of arm drop.  [facility] primary read: Brilynn Biasi, DO Three-view x-ray of the shoulder reveals mild osteoarthritic changes. Type I acromium. No option of the humeral head. No signs of fracture dislocation   ASSESSMENT & PLAN:  Patient 79 year old female presents today with right shoulder sprain of rotator cuff tendinitis after aggressive lifting over the weekend. After intra-articular subacromial injection. Patient had improve range of motion to 90 and resolution of drop arm. Recommended treatment includes anti-inflammatory control with present injection. Additional anti-inflammatory control with Mobitc 15 mg daily for the next 2 weeks. Patient also prescribed tramadol for additional breakthrough pain control.  Recommended  follow-up in one week to reassess patient's range of motion and strength.  Patient continues to have weakness in her range of motion recommend MRI to further evaluation for possible rotator cuff tear.

## 2014-03-30 ENCOUNTER — Ambulatory Visit (INDEPENDENT_AMBULATORY_CARE_PROVIDER_SITE_OTHER): Payer: PPO | Admitting: Sports Medicine

## 2014-03-30 VITALS — BP 126/78 | HR 62 | Temp 98.0°F | Resp 17 | Ht 65.0 in | Wt 180.0 lb

## 2014-03-30 DIAGNOSIS — M7581 Other shoulder lesions, right shoulder: Secondary | ICD-10-CM

## 2014-03-30 NOTE — Progress Notes (Signed)
Patient ID: Wanda Green, female   DOB: 08/12/1934, 80 y.o.   MRN: [id number] Patient ID: Wanda Green, female   DOB: [birth year], 80 y.o.   MRN: [id number]  Wanda Green - 80 y.o. female MRN [id number]  Date of birth: 1934-10-22  SUBJECTIVE:  Including CC & ROS.  Patient 80 year old female presents today for follow-up right shoulder pain. Patient was seen last week for acute right shoulder rotator cuff tendinitis with concern for possible degenerative tear after after lifting firewood for several hours 2 weeks ago. Patient was evaluated with a x-ray of her shoulder last week that did not show any degenerative changes or humeral up slip. Range of motion and strength were limited. Patient was treated with a subacromial injection with lidocaine and Depo-Medrol. After therapeutic injection patient had improve range of motion and slightly less pain. Today the patient reports resolution of her pain, return of range of motion and strength. Reports being back to baseline with no other difficulty. Did not take any of the tramadol was provided for her for extra pain relief.  ROS:  Constitutional:  No fever, chills, or fatigue.  Respiratory:  No shortness of breath, cough, or wheezing Cardiovascular:  No palpitations, chest pain or syncope Gastrointestinal:  No nausea, no abdominal pain Review of systems otherwise negative except for what is stated in HPI  HISTORY: Past Medical, Surgical, Social, and Family History Reviewed & Updated per EMR. Pertinent Historical Findings include: Past medical history significant for Atrial fibrillation on chronic anticoagulation therapy Hypertension Obstructive sleep apnea Hypertension Stage II breast cancer  PHYSICAL EXAM:  VS: BP:126/78 mmHg  HR:62bpm  TEMP:98 F (36.7 C)(Oral)  RESP:99 %  HT:5\' 5"  (165.1 cm)   WT:180 lb (81.647 kg)  BMI:30 Right SHOULDER EXAM:  General: well nourished Skin of UE: warm; dry, no rashes, lesions, ecchymosis or  erythema. Vascular: radial pulses 2+ bilaterally Neurologically: Normal sensation with no sensory or motor defects in C4-C8, bilateral Palpation: no tenderness over the [facility] joint, acromion, no bicipital grove tenderness, no supraspinatus tenderness ROM active/passive: symmetric full 180 degree of abduction and forward flexion, symmetric internal (80-90) and external rotation (90) with shoulder at 90 abduction. Appley's scratch test equal bilaterally Strength testing: 5/5 symmetric strength in internal and external rotation, forward flexion, adduction and abduction     Special Test: Negative Neer's, neg Hawkins, negative empty can, neg O'Brien, neg     speeds, neg apprehension    ASSESSMENT & PLAN:  Patient's falling up right shoulder rotator cuff tendinitis. Clinically recovered after subacromial injection. Advised patient to continue working on range of motion and strengthening exercises at home to prevent further dictation to her muscles. Discontinue the Moberg and tramadol at this point as she is recovered. Will follow PRN

## 2014-05-19 ENCOUNTER — Encounter: Payer: Self-pay | Admitting: Gynecology

## 2014-08-10 ENCOUNTER — Encounter: Payer: Medicare HMO | Admitting: Oncology

## 2014-08-30 ENCOUNTER — Inpatient Hospital Stay (HOSPITAL_COMMUNITY)
Admission: EM | Admit: 2014-08-30 | Discharge: 2014-09-02 | DRG: 395 | Disposition: A | Discharge status: Home / Self Care | Payer: PPO | Attending: Cardiology | Admitting: Cardiology

## 2014-08-30 ENCOUNTER — Emergency Department (HOSPITAL_COMMUNITY): Payer: PPO

## 2014-08-30 ENCOUNTER — Encounter (HOSPITAL_COMMUNITY): Payer: Self-pay | Admitting: Emergency Medicine

## 2014-08-30 DIAGNOSIS — K559 Vascular disorder of intestine, unspecified: Secondary | ICD-10-CM | POA: Diagnosis not present

## 2014-08-30 DIAGNOSIS — G4733 Obstructive sleep apnea (adult) (pediatric): Secondary | ICD-10-CM | POA: Diagnosis present

## 2014-08-30 DIAGNOSIS — I10 Essential (primary) hypertension: Secondary | ICD-10-CM | POA: Diagnosis present

## 2014-08-30 DIAGNOSIS — R112 Nausea with vomiting, unspecified: Secondary | ICD-10-CM

## 2014-08-30 DIAGNOSIS — R1032 Left lower quadrant pain: Secondary | ICD-10-CM | POA: Diagnosis not present

## 2014-08-30 DIAGNOSIS — Z853 Personal history of malignant neoplasm of breast: Secondary | ICD-10-CM

## 2014-08-30 DIAGNOSIS — K219 Gastro-esophageal reflux disease without esophagitis: Secondary | ICD-10-CM | POA: Diagnosis present

## 2014-08-30 DIAGNOSIS — K921 Melena: Secondary | ICD-10-CM

## 2014-08-30 DIAGNOSIS — Z87891 Personal history of nicotine dependence: Secondary | ICD-10-CM

## 2014-08-30 DIAGNOSIS — K529 Noninfective gastroenteritis and colitis, unspecified: Secondary | ICD-10-CM

## 2014-08-30 DIAGNOSIS — I480 Paroxysmal atrial fibrillation: Secondary | ICD-10-CM | POA: Diagnosis present

## 2014-08-30 DIAGNOSIS — Z8719 Personal history of other diseases of the digestive system: Secondary | ICD-10-CM

## 2014-08-30 DIAGNOSIS — R197 Diarrhea, unspecified: Secondary | ICD-10-CM

## 2014-08-30 DIAGNOSIS — E876 Hypokalemia: Secondary | ICD-10-CM | POA: Diagnosis not present

## 2014-08-30 DIAGNOSIS — M810 Age-related osteoporosis without current pathological fracture: Secondary | ICD-10-CM | POA: Diagnosis present

## 2014-08-30 LAB — COMPREHENSIVE METABOLIC PANEL
ALBUMIN: 4.3 g/dL (ref 3.5–5.0)
ALK PHOS: 52 U/L (ref 38–126)
ALT: 14 U/L (ref 14–54)
ANION GAP: 13 (ref 5–15)
AST: 19 U/L (ref 15–41)
BUN: 25 mg/dL — AB (ref 6–20)
CO2: 17 mmol/L — ABNORMAL LOW (ref 22–32)
CREATININE: 1.14 mg/dL — AB (ref 0.44–1.00)
Calcium: 9.1 mg/dL (ref 8.9–10.3)
Chloride: 108 mmol/L (ref 101–111)
GFR calc non Af Amer: 45 mL/min — ABNORMAL LOW (ref >60)
GFR, EST AFRICAN AMERICAN: 52 mL/min — AB (ref >60)
GLUCOSE: 131 mg/dL — AB (ref 65–99)
POTASSIUM: 4 mmol/L (ref 3.5–5.1)
Sodium: 138 mmol/L (ref 135–145)
TOTAL PROTEIN: 7.3 g/dL (ref 6.5–8.1)
Total Bilirubin: 0.8 mg/dL (ref 0.3–1.2)

## 2014-08-30 LAB — URINALYSIS, ROUTINE W REFLEX MICROSCOPIC
Bilirubin Urine: NEGATIVE
GLUCOSE, UA: NEGATIVE mg/dL
HGB URINE DIPSTICK: NEGATIVE
Ketones, ur: 15 mg/dL — AB
NITRITE: NEGATIVE
PH: 5 (ref 5.0–8.0)
Protein, ur: NEGATIVE mg/dL
SPECIFIC GRAVITY, URINE: 1.018 (ref 1.005–1.030)
UROBILINOGEN UA: 0.2 mg/dL (ref 0.0–1.0)

## 2014-08-30 LAB — PROTIME-INR
INR: 1.08 (ref 0.00–1.49)
Prothrombin Time: 14.2 seconds (ref 11.6–15.2)

## 2014-08-30 LAB — CBC WITH DIFFERENTIAL/PLATELET
BASOS ABS: 0 10*3/uL (ref 0.0–0.1)
BASOS PCT: 0 % (ref 0–1)
EOS ABS: 0 10*3/uL (ref 0.0–0.7)
Eosinophils Relative: 0 % (ref 0–5)
HEMATOCRIT: 32.7 % — AB (ref 36.0–46.0)
HEMOGLOBIN: 10.7 g/dL — AB (ref 12.0–15.0)
Lymphocytes Relative: 6 % — ABNORMAL LOW (ref 12–46)
Lymphs Abs: 0.8 10*3/uL (ref 0.7–4.0)
MCH: 29.9 pg (ref 26.0–34.0)
MCHC: 32.7 g/dL (ref 30.0–36.0)
MCV: 91.3 fL (ref 78.0–100.0)
MONO ABS: 0.7 10*3/uL (ref 0.1–1.0)
MONOS PCT: 6 % (ref 3–12)
NEUTROS ABS: 11.1 10*3/uL — AB (ref 1.7–7.7)
NEUTROS PCT: 88 % — AB (ref 43–77)
Platelets: 179 10*3/uL (ref 150–400)
RBC: 3.58 MIL/uL — ABNORMAL LOW (ref 3.87–5.11)
RDW: 13.9 % (ref 11.5–15.5)
WBC: 12.6 10*3/uL — ABNORMAL HIGH (ref 4.0–10.5)

## 2014-08-30 LAB — TYPE AND SCREEN
ABO/RH(D): O POS
ANTIBODY SCREEN: NEGATIVE

## 2014-08-30 LAB — URINE MICROSCOPIC-ADD ON

## 2014-08-30 LAB — ABO/RH: ABO/RH(D): O POS

## 2014-08-30 LAB — I-STAT CG4 LACTIC ACID, ED: Lactic Acid, Venous: 0.83 mmol/L (ref 0.5–2.0)

## 2014-08-30 LAB — LIPASE, BLOOD: Lipase: 16 U/L — ABNORMAL LOW (ref 22–51)

## 2014-08-30 MED ORDER — LORAZEPAM 2 MG/ML IJ SOLN
0.5000 mg | Freq: Once | INTRAMUSCULAR | Status: AC
  Administered 2014-08-30: 0.5 mg via INTRAVENOUS
  Filled 2014-08-30: qty 1

## 2014-08-30 MED ORDER — ONDANSETRON HCL 4 MG/2ML IJ SOLN
4.0000 mg | Freq: Once | INTRAMUSCULAR | Status: AC
  Administered 2014-08-30: 4 mg via INTRAVENOUS
  Filled 2014-08-30: qty 2

## 2014-08-30 MED ORDER — SODIUM CHLORIDE 0.9 % IV BOLUS (SEPSIS)
1000.0000 mL | Freq: Once | INTRAVENOUS | Status: AC
  Administered 2014-08-30: 1000 mL via INTRAVENOUS

## 2014-08-30 MED ORDER — MORPHINE SULFATE 4 MG/ML IJ SOLN
4.0000 mg | Freq: Once | INTRAMUSCULAR | Status: AC
  Administered 2014-08-30: 4 mg via INTRAVENOUS
  Filled 2014-08-30: qty 1

## 2014-08-30 MED ORDER — IOHEXOL 350 MG/ML SOLN
80.0000 mL | Freq: Once | INTRAVENOUS | Status: AC | PRN
  Administered 2014-08-30: 100 mL via INTRAVENOUS

## 2014-08-30 NOTE — ED Notes (Signed)
Patient transported to CT 

## 2014-08-30 NOTE — ED Notes (Signed)
Pt. Unable to void at the moment. 

## 2014-08-30 NOTE — ED Notes (Signed)
Pt called out asking for more pain medication.

## 2014-08-30 NOTE — ED Notes (Signed)
Per EMS: dx with ischemic colitis before, picked up from home today, pain in LLQ abdomen started at 1430, has had intermittent vomiting, diarrhea with bright red blood with small clots. 100 mcg fentanyl given en route along with 4 of zofran in a 22 gauge left forearm IV started by EMS.

## 2014-08-30 NOTE — ED Provider Notes (Signed)
CSN: [id number]     Arrival date & time 08/30/14  1809 History   First MD Initiated Contact with Patient 08/30/14 1813     Chief Complaint  Patient presents with  . Rectal Bleeding  . Abdominal Pain     (Consider location/radiation/quality/duration/timing/severity/associated sxs/prior Treatment) Patient is a 80 y.o. female presenting with GI illness.  GI Problem This is a recurrent problem. The current episode started today. The problem occurs 2 to 4 times per day. The problem has been unchanged. Associated symptoms include abdominal pain (LLQ; constant;), nausea and vomiting. Pertinent negatives include no chest pain, chills, congestion, coughing, fatigue, fever, headaches, myalgias, rash, sore throat, urinary symptoms or weakness. Nothing aggravates the symptoms. She has tried nothing for the symptoms. The treatment provided no relief.    Past Medical History  Diagnosis Date  . Breast cancer   . Hypertension   . Osteoporosis 06/2012    T score -2.5  . Colitis, ischemic   . Elevated cholesterol   . Cervical dysplasia age 33  . Arthritis   . Sleep apnea    Past Surgical History  Procedure Laterality Date  . Bi lateral knee scope    . Breast surgery  2000    RIGHT BREAST LUMPECTOMY  . Tubal ligation    . Colposcopy    . Cervical cone biopsy  age 36  . Nasal sinus surgery  12/14   Family History  Problem Relation Age of Onset  . Hypertension Mother   . Heart failure Mother   . Heart disease Mother   . Hypertension Father   . Heart disease Father   . Hypertension Sister   . Uterine cancer Sister   . Hypertension Brother    History  Substance Use Topics  . Smoking status: Former Smoker -- 0.10 packs/day for 3 years    Types: Cigarettes    Quit date: 03/05/1972  . Smokeless tobacco: Never Used     Comment: ONE CIG A DAY  . Alcohol Use: No   OB History    Gravida Para Term Preterm AB TAB SAB Ectopic Multiple Living   3 3 3       3      Review of Systems   Constitutional: Negative for fever, chills, appetite change and fatigue.  HENT: Negative for congestion, ear pain, facial swelling, mouth sores and sore throat.   Eyes: Negative for visual disturbance.  Respiratory: Negative for cough, chest tightness and shortness of breath.   Cardiovascular: Negative for chest pain and palpitations.  Gastrointestinal: Positive for nausea, vomiting, abdominal pain (LLQ; constant;), diarrhea and anal bleeding. Negative for blood in stool.  Endocrine: Negative for cold intolerance and heat intolerance.  Genitourinary: Negative for frequency, decreased urine volume and difficulty urinating.  Musculoskeletal: Negative for myalgias, back pain and neck stiffness.  Skin: Negative for rash.  Neurological: Negative for dizziness, weakness, light-headedness and headaches.      Allergies  Augmentin; Hydrocodone; Oxycodone-acetaminophen; Penicillins; Codeine; and Doxycycline  Home Medications   Prior to Admission medications   Medication Sig Start Date End Date Taking? Authorizing Provider  acetaminophen (TYLENOL) 500 MG tablet Take 500 mg by mouth every 6 (six) hours as needed for mild pain or moderate pain.   Yes Historical Provider, MD  amLODipine (NORVASC) 2.5 MG tablet Take 2.5 mg by mouth daily. 08/12/14  Yes Historical Provider, MD  atorvastatin (LIPITOR) 20 MG tablet Take 20 mg by mouth every other day.    Yes Historical Provider, MD  azithromycin (  ZITHROMAX) 250 MG tablet Take 250 mg by mouth daily. 08/09/14  Yes Historical Provider, MD  cetirizine (ZYRTEC) 10 MG tablet Take 10 mg by mouth at bedtime.   Yes Historical Provider, MD  losartan (COZAAR) 100 MG tablet Take 100 mg by mouth daily.   Yes Historical Provider, MD  metoprolol succinate (TOPROL-XL) 25 MG 24 hr tablet Take 25-50 mg by mouth daily. 25 am and 50mg  in pm   Yes Historical Provider, MD  omeprazole (PRILOSEC) 40 MG capsule Take 1 capsule (40 mg total) by mouth daily as needed. 07/14/12  Yes  Leandrew Koyanagi, MD  oxybutynin (DITROPAN-XL) 10 MG 24 hr tablet TAKE 1 TABLET (10 MG TOTAL) BY MOUTH AT BEDTIME. 02/18/14  Yes Anastasio Auerbach, MD  meloxicam (MOBIC) 15 MG tablet Take 1 tablet by mouth daily with breakfast for 2 weeks then when necessary Patient not taking: Reported on 08/30/2014 03/23/14   Deanna M Didiano, DO  traMADol (ULTRAM) 50 MG tablet Take 1 tablet (50 mg total) by mouth every 12 (twelve) hours as needed for severe pain. Patient not taking: Reported on 08/30/2014 03/23/14   Deanna M Didiano, DO   BP 125/76 mmHg  Pulse 94  Temp(Src) 97.8 F (36.6 C) (Oral)  Resp 20  Ht 5' 3.5" (1.613 m)  Wt 175 lb (79.379 kg)  BMI 30.51 kg/m2  SpO2 99% Physical Exam  Constitutional: She is oriented to person, place, and time. She appears well-developed and well-nourished. No distress.  HENT:  Head: Normocephalic and atraumatic.  Right Ear: External ear normal.  Left Ear: External ear normal.  Nose: Nose normal.  Eyes: Conjunctivae and EOM are normal. Pupils are equal, round, and reactive to light. Right eye exhibits no discharge. Left eye exhibits no discharge. No scleral icterus.  Neck: Normal range of motion. Neck supple.  Cardiovascular: Normal rate, regular rhythm and normal heart sounds.  Exam reveals no gallop and no friction rub.   No murmur heard. Pulmonary/Chest: Effort normal and breath sounds normal. No stridor. No respiratory distress. She has no wheezes.  Abdominal: Soft. She exhibits no distension. There is tenderness in the left lower quadrant. There is no rigidity, no rebound and no guarding.  Musculoskeletal: She exhibits no edema or tenderness.  Neurological: She is alert and oriented to person, place, and time.  Skin: Skin is warm and dry. No rash noted. She is not diaphoretic. No erythema.  Psychiatric: She has a normal mood and affect.    ED Course  Procedures (including critical care time) Labs Review Labs Reviewed  CBC WITH DIFFERENTIAL/PLATELET  - Abnormal; Notable for the following:    WBC 12.6 (*)    RBC 3.58 (*)    Hemoglobin 10.7 (*)    HCT 32.7 (*)    Neutrophils Relative % 88 (*)    Neutro Abs 11.1 (*)    Lymphocytes Relative 6 (*)    All other components within normal limits  COMPREHENSIVE METABOLIC PANEL - Abnormal; Notable for the following:    CO2 17 (*)    Glucose, Bld 131 (*)    BUN 25 (*)    Creatinine, Ser 1.14 (*)    GFR calc non Af Amer 45 (*)    GFR calc Af Amer 52 (*)    All other components within normal limits  LIPASE, BLOOD - Abnormal; Notable for the following:    Lipase 16 (*)    All other components within normal limits  URINALYSIS, ROUTINE W REFLEX MICROSCOPIC (NOT AT  [facility]) - Abnormal; Notable for the following:    Color, Urine AMBER (*)    Ketones, ur 15 (*)    Leukocytes, UA TRACE (*)    All other components within normal limits  URINE MICROSCOPIC-ADD ON - Abnormal; Notable for the following:    Casts HYALINE CASTS (*)    All other components within normal limits  URINE CULTURE  PROTIME-INR  I-STAT CG4 LACTIC ACID, ED  I-STAT CG4 LACTIC ACID, ED  I-STAT CG4 LACTIC ACID, ED  I-STAT CG4 LACTIC ACID, ED  TYPE AND SCREEN  ABO/RH    Imaging Review Ct Cta Abd/pel W/cm &/or W/o Cm  08/31/2014   CLINICAL DATA:  Rectal bleeding and abdominal pain. Left lower quadrant abdominal pain. Intermittent vomiting. History of ischemic colitis.  EXAM: CTA ABDOMEN AND PELVIS wITHOUT AND WITH CONTRAST  TECHNIQUE: Multidetector CT imaging of the abdomen and pelvis was performed using the standard protocol during bolus administration of intravenous contrast. Multiplanar reconstructed images and MIPs were obtained and reviewed to evaluate the vascular anatomy.  CONTRAST:  153mL OMNIPAQUE IOHEXOL 350 MG/ML SOLN  COMPARISON:  CT 04/09/2012  FINDINGS: Atelectasis or scarring in the right middle lobe. Mild motion artifact through the lung bases.  Normal caliber abdominal aorta without aneurysm or dissection. Celiac,  superior mesenteric, and inferior mesenteric arteries are patent. There is no abrupt occlusion. Single bilateral renal arteries are patent.  There is diffuse colonic wall thickening from the mid transverse colon distally through the descending colon. Mild associated pericolonic inflammatory change. There is no evidence of active extravasation. Small volume of stool in the proximal colon. There is no small bowel dilatation. Small hiatal hernia. Stomach is decompressed. No free air, free fluid, or intra-abdominal fluid collection.  Scattered hepatic granuloma, no focal hepatic lesion. The gallbladder, spleen, and right adrenal gland are normal. There is a punctate left adrenal calcification. The pancreas is normal. Parapelvic cysts in the left kidney. Kidneys demonstrate symmetric enhancement. Tiny fat containing umbilical hernia. No retroperitoneal or mesenteric adenopathy.  Urinary bladder is physiologically distended. The uterus and adnexa are normal for age. There is no pelvic free fluid.  There are no acute or suspicious osseous abnormalities. There is facet arthropathy in the lower lumbar spine with minimal anterolisthesis of L4 on L5, likely degenerative.  Review of the MIP images confirms the above findings.  IMPRESSION: Colitis extending from the mid transverse to the distal descending colon. No active extravasation or broke arterial occlusion. This may be ischemic, infectious or inflammatory.   Electronically Signed   By: Jeb Levering M.D.   On: 08/31/2014 00:36     EKG Interpretation None      MDM   80 year old female history of hypertension, and prior ischemic colitis presents with 1 day of nausea vomiting and diarrhea with hematochezia and left lower abdominal pain. History and exam as above. Labs with leukocytosis, stable hemoglobin and renal function. No lactic acidosis. CT with colitis from the mid transverse to the distal descending colon. Patient given IV fluids and pain medication.  Patient given Cipro Flagyl for possible infectious colitis. This could also be ischemic given her prior history. She'll require admission for further management and workup.  Patient seen in conjunction with Dr. Wyvonnia Dusky.  Final diagnoses:  Colitis, acute  Nausea vomiting and diarrhea  Hematochezia        Addison Lank, MD 08/31/14 0120  Ezequiel Essex, MD 08/31/14 [phone]

## 2014-08-31 ENCOUNTER — Encounter (HOSPITAL_COMMUNITY): Payer: Self-pay | Admitting: *Deleted

## 2014-08-31 DIAGNOSIS — Z87891 Personal history of nicotine dependence: Secondary | ICD-10-CM | POA: Diagnosis not present

## 2014-08-31 DIAGNOSIS — E876 Hypokalemia: Secondary | ICD-10-CM | POA: Diagnosis not present

## 2014-08-31 DIAGNOSIS — R1032 Left lower quadrant pain: Secondary | ICD-10-CM | POA: Diagnosis present

## 2014-08-31 DIAGNOSIS — K529 Noninfective gastroenteritis and colitis, unspecified: Secondary | ICD-10-CM | POA: Diagnosis present

## 2014-08-31 DIAGNOSIS — G4733 Obstructive sleep apnea (adult) (pediatric): Secondary | ICD-10-CM | POA: Diagnosis present

## 2014-08-31 DIAGNOSIS — M810 Age-related osteoporosis without current pathological fracture: Secondary | ICD-10-CM | POA: Diagnosis present

## 2014-08-31 DIAGNOSIS — K219 Gastro-esophageal reflux disease without esophagitis: Secondary | ICD-10-CM | POA: Diagnosis present

## 2014-08-31 DIAGNOSIS — I10 Essential (primary) hypertension: Secondary | ICD-10-CM | POA: Diagnosis present

## 2014-08-31 DIAGNOSIS — K559 Vascular disorder of intestine, unspecified: Secondary | ICD-10-CM | POA: Diagnosis present

## 2014-08-31 DIAGNOSIS — Z853 Personal history of malignant neoplasm of breast: Secondary | ICD-10-CM | POA: Diagnosis not present

## 2014-08-31 DIAGNOSIS — Z8719 Personal history of other diseases of the digestive system: Secondary | ICD-10-CM | POA: Diagnosis not present

## 2014-08-31 DIAGNOSIS — I480 Paroxysmal atrial fibrillation: Secondary | ICD-10-CM | POA: Diagnosis present

## 2014-08-31 LAB — COMPREHENSIVE METABOLIC PANEL
ALT: 12 U/L — AB (ref 14–54)
AST: 17 U/L (ref 15–41)
Albumin: 3.5 g/dL (ref 3.5–5.0)
Alkaline Phosphatase: 39 U/L (ref 38–126)
Anion gap: 8 (ref 5–15)
BUN: 13 mg/dL (ref 6–20)
CHLORIDE: 109 mmol/L (ref 101–111)
CO2: 21 mmol/L — AB (ref 22–32)
CREATININE: 1.08 mg/dL — AB (ref 0.44–1.00)
Calcium: 8.4 mg/dL — ABNORMAL LOW (ref 8.9–10.3)
GFR calc Af Amer: 56 mL/min — ABNORMAL LOW (ref >60)
GFR calc non Af Amer: 48 mL/min — ABNORMAL LOW (ref >60)
GLUCOSE: 131 mg/dL — AB (ref 65–99)
Potassium: 3.6 mmol/L (ref 3.5–5.1)
Sodium: 138 mmol/L (ref 135–145)
Total Bilirubin: 0.8 mg/dL (ref 0.3–1.2)
Total Protein: 6.4 g/dL — ABNORMAL LOW (ref 6.5–8.1)

## 2014-08-31 LAB — CBC WITH DIFFERENTIAL/PLATELET
Basophils Absolute: 0 10*3/uL (ref 0.0–0.1)
Basophils Relative: 0 % (ref 0–1)
EOS ABS: 0 10*3/uL (ref 0.0–0.7)
EOS PCT: 0 % (ref 0–5)
HEMATOCRIT: 30.2 % — AB (ref 36.0–46.0)
Hemoglobin: 10.1 g/dL — ABNORMAL LOW (ref 12.0–15.0)
LYMPHS ABS: 0.8 10*3/uL (ref 0.7–4.0)
LYMPHS PCT: 6 % — AB (ref 12–46)
MCH: 30.6 pg (ref 26.0–34.0)
MCHC: 33.4 g/dL (ref 30.0–36.0)
MCV: 91.5 fL (ref 78.0–100.0)
MONO ABS: 1.5 10*3/uL — AB (ref 0.1–1.0)
Monocytes Relative: 12 % (ref 3–12)
Neutro Abs: 11.1 10*3/uL — ABNORMAL HIGH (ref 1.7–7.7)
Neutrophils Relative %: 82 % — ABNORMAL HIGH (ref 43–77)
PLATELETS: 170 10*3/uL (ref 150–400)
RBC: 3.3 MIL/uL — ABNORMAL LOW (ref 3.87–5.11)
RDW: 14.3 % (ref 11.5–15.5)
WBC: 13.4 10*3/uL — ABNORMAL HIGH (ref 4.0–10.5)

## 2014-08-31 LAB — AMYLASE: Amylase: 105 U/L — ABNORMAL HIGH (ref 28–100)

## 2014-08-31 LAB — I-STAT CG4 LACTIC ACID, ED: LACTIC ACID, VENOUS: 1.38 mmol/L (ref 0.5–2.0)

## 2014-08-31 MED ORDER — LOSARTAN POTASSIUM 50 MG PO TABS
100.0000 mg | ORAL_TABLET | Freq: Every day | ORAL | Status: DC
  Administered 2014-08-31 – 2014-09-02 (×3): 100 mg via ORAL
  Filled 2014-08-31 (×4): qty 2

## 2014-08-31 MED ORDER — PANTOPRAZOLE SODIUM 40 MG PO TBEC
40.0000 mg | DELAYED_RELEASE_TABLET | Freq: Every day | ORAL | Status: DC
  Administered 2014-08-31 – 2014-09-02 (×3): 40 mg via ORAL
  Filled 2014-08-31 (×3): qty 1

## 2014-08-31 MED ORDER — METRONIDAZOLE IN NACL 5-0.79 MG/ML-% IV SOLN
500.0000 mg | Freq: Four times a day (QID) | INTRAVENOUS | Status: DC
Start: 2014-08-31 — End: 2014-09-01
  Administered 2014-08-31 – 2014-09-01 (×5): 500 mg via INTRAVENOUS
  Filled 2014-08-31 (×8): qty 100

## 2014-08-31 MED ORDER — METOPROLOL SUCCINATE ER 25 MG PO TB24: 25.0000 mg | ORAL_TABLET | Freq: Every day | ORAL | Status: DC

## 2014-08-31 MED ORDER — SODIUM CHLORIDE 0.9 % IV SOLN
Freq: Once | INTRAVENOUS | Status: AC
  Administered 2014-08-31: 1000 mL via INTRAVENOUS

## 2014-08-31 MED ORDER — METRONIDAZOLE IN NACL 5-0.79 MG/ML-% IV SOLN
500.0000 mg | Freq: Once | INTRAVENOUS | Status: AC
  Administered 2014-08-31: 500 mg via INTRAVENOUS
  Filled 2014-08-31: qty 100

## 2014-08-31 MED ORDER — METOPROLOL SUCCINATE ER 50 MG PO TB24
50.0000 mg | ORAL_TABLET | Freq: Every day | ORAL | Status: DC
  Filled 2014-08-31: qty 1

## 2014-08-31 MED ORDER — CIPROFLOXACIN IN D5W 400 MG/200ML IV SOLN
400.0000 mg | Freq: Two times a day (BID) | INTRAVENOUS | Status: DC
  Administered 2014-08-31 – 2014-09-01 (×3): 400 mg via INTRAVENOUS
  Filled 2014-08-31 (×4): qty 200

## 2014-08-31 MED ORDER — ATORVASTATIN CALCIUM 20 MG PO TABS
20.0000 mg | ORAL_TABLET | ORAL | Status: DC
  Administered 2014-08-31 – 2014-09-02 (×2): 20 mg via ORAL
  Filled 2014-08-31 (×3): qty 1

## 2014-08-31 MED ORDER — CIPROFLOXACIN IN D5W 400 MG/200ML IV SOLN
400.0000 mg | Freq: Once | INTRAVENOUS | Status: AC
  Administered 2014-08-31: 400 mg via INTRAVENOUS
  Filled 2014-08-31: qty 200

## 2014-08-31 MED ORDER — MORPHINE SULFATE 2 MG/ML IJ SOLN: 2.0000 mg | INTRAMUSCULAR | Status: DC | PRN

## 2014-08-31 MED ORDER — SODIUM CHLORIDE 0.9 % IV SOLN
INTRAVENOUS | Status: DC
  Administered 2014-08-31 – 2014-09-01 (×3): via INTRAVENOUS

## 2014-08-31 MED ORDER — METOPROLOL SUCCINATE ER 25 MG PO TB24
25.0000 mg | ORAL_TABLET | Freq: Every day | ORAL | Status: DC
  Administered 2014-08-31 – 2014-09-02 (×3): 25 mg via ORAL
  Filled 2014-08-31 (×4): qty 1

## 2014-08-31 NOTE — Plan of Care (Signed)
Problem: Phase I Progression Outcomes Goal: Initial discharge plan identified Outcome: Completed/Met Date Met:  08/31/14 Return home with daughter

## 2014-08-31 NOTE — H&P (Signed)
Wanda Green is an 80 y.o. female.   Chief Complaint: Left lower quadrant abdominal pain associated with nausea vomiting diarrhea and rectal bleeding HPI: Patient is 80 year old female with past medical history significant for hypertension, history of paroxysmal atrial fibrillation, GERD, history of colitis in the past, history of CA of breast, history of mild obstructive sleep apnea and, osteoporosis, came to the ER complaining of left lower quadrant abdominal pain associated with nausea vomiting and diarrhea associated with bright red blood per rectum while at work yesterday afternoon patient denies any fever but complains of chills. Denies any palpitations lightheadedness or syncope. Denies any chest pain. Denies shortness of breath. Denies any NSAIDs abuse. Patient had similar presentation approximately 2 years ago and was treated for infectious colitis. Patient was on oral anticoagulations for short period of time but patient refused to take anticoagulations for paroxysmal atrial fibrillation.  Past Medical History  Diagnosis Date  . Breast cancer   . Hypertension   . Osteoporosis 06/2012    T score -2.5  . Colitis, ischemic   . Elevated cholesterol   . Cervical dysplasia age 42  . Arthritis   . Sleep apnea     Past Surgical History  Procedure Laterality Date  . Bi lateral knee scope    . Breast surgery  2000    RIGHT BREAST LUMPECTOMY  . Tubal ligation    . Colposcopy    . Cervical cone biopsy  age 10  . Nasal sinus surgery  12/14    Family History  Problem Relation Age of Onset  . Hypertension Mother   . Heart failure Mother   . Heart disease Mother   . Hypertension Father   . Heart disease Father   . Hypertension Sister   . Uterine cancer Sister   . Hypertension Brother    Social History:  reports that she quit smoking about 42 years ago. Her smoking use included Cigarettes. She has a .3 pack-year smoking history. She has never used smokeless tobacco. She reports that  she does not drink alcohol or use illicit drugs.  Allergies:  Allergies  Allergen Reactions  . Augmentin [Amoxicillin-Pot Clavulanate]     Mouth pain  . Hydrocodone Nausea And Vomiting  . Oxycodone-Acetaminophen   . Penicillins     bleeding  . Codeine Nausea And Vomiting  . Doxycycline Nausea And Vomiting    Medications Prior to Admission  Medication Sig Dispense Refill  . acetaminophen (TYLENOL) 500 MG tablet Take 500 mg by mouth every 6 (six) hours as needed for mild pain or moderate pain.    Marland Kitchen amLODipine (NORVASC) 2.5 MG tablet Take 2.5 mg by mouth daily.  3  . atorvastatin (LIPITOR) 20 MG tablet Take 20 mg by mouth every other day.     Marland Kitchen azithromycin (ZITHROMAX) 250 MG tablet Take 250 mg by mouth daily.  2  . cetirizine (ZYRTEC) 10 MG tablet Take 10 mg by mouth at bedtime.    Marland Kitchen losartan (COZAAR) 100 MG tablet Take 100 mg by mouth daily.    . metoprolol succinate (TOPROL-XL) 25 MG 24 hr tablet Take 25-50 mg by mouth daily. 25 am and 69m in pm    . omeprazole (PRILOSEC) 40 MG capsule Take 1 capsule (40 mg total) by mouth daily as needed. 90 capsule 3  . oxybutynin (DITROPAN-XL) 10 MG 24 hr tablet TAKE 1 TABLET (10 MG TOTAL) BY MOUTH AT BEDTIME. 30 tablet 12  . meloxicam (MOBIC) 15 MG tablet Take 1 tablet  by mouth daily with breakfast for 2 weeks then when necessary (Patient not taking: Reported on 08/30/2014) 14 tablet 0  . traMADol (ULTRAM) 50 MG tablet Take 1 tablet (50 mg total) by mouth every 12 (twelve) hours as needed for severe pain. (Patient not taking: Reported on 08/30/2014) 30 tablet 0    Results for orders placed or performed during the hospital encounter of 08/30/14 (from the past 48 hour(s))  Type and screen     Status: None   Collection Time: 08/30/14  6:35 PM  Result Value Ref Range   ABO/RH(D) O POS    Antibody Screen NEG    Sample Expiration 09/02/2014   CBC with Differential     Status: Abnormal   Collection Time: 08/30/14  7:38 PM  Result Value Ref Range    WBC 12.6 (H) 4.0 - 10.5 K/uL   RBC 3.58 (L) 3.87 - 5.11 MIL/uL   Hemoglobin 10.7 (L) 12.0 - 15.0 g/dL   HCT 32.7 (L) 36.0 - 46.0 %   MCV 91.3 78.0 - 100.0 fL   MCH 29.9 26.0 - 34.0 pg   MCHC 32.7 30.0 - 36.0 g/dL   RDW 13.9 11.5 - 15.5 %   Platelets 179 150 - 400 K/uL   Neutrophils Relative % 88 (H) 43 - 77 %   Neutro Abs 11.1 (H) 1.7 - 7.7 K/uL   Lymphocytes Relative 6 (L) 12 - 46 %   Lymphs Abs 0.8 0.7 - 4.0 K/uL   Monocytes Relative 6 3 - 12 %   Monocytes Absolute 0.7 0.1 - 1.0 K/uL   Eosinophils Relative 0 0 - 5 %   Eosinophils Absolute 0.0 0.0 - 0.7 K/uL   Basophils Relative 0 0 - 1 %   Basophils Absolute 0.0 0.0 - 0.1 K/uL  Comprehensive metabolic panel     Status: Abnormal   Collection Time: 08/30/14  7:38 PM  Result Value Ref Range   Sodium 138 135 - 145 mmol/L   Potassium 4.0 3.5 - 5.1 mmol/L   Chloride 108 101 - 111 mmol/L   CO2 17 (L) 22 - 32 mmol/L   Glucose, Bld 131 (H) 65 - 99 mg/dL   BUN 25 (H) 6 - 20 mg/dL   Creatinine, Ser 1.14 (H) 0.44 - 1.00 mg/dL   Calcium 9.1 8.9 - 10.3 mg/dL   Total Protein 7.3 6.5 - 8.1 g/dL   Albumin 4.3 3.5 - 5.0 g/dL   AST 19 15 - 41 U/L   ALT 14 14 - 54 U/L   Alkaline Phosphatase 52 38 - 126 U/L   Total Bilirubin 0.8 0.3 - 1.2 mg/dL   GFR calc non Af Amer 45 (L) >60 mL/min   GFR calc Af Amer 52 (L) >60 mL/min    Comment: (NOTE) The eGFR has been calculated using the CKD EPI equation. This calculation has not been validated in all clinical situations. eGFR's persistently <60 mL/min signify possible Chronic Kidney Disease.    Anion gap 13 5 - 15  Lipase, blood     Status: Abnormal   Collection Time: 08/30/14  7:38 PM  Result Value Ref Range   Lipase 16 (L) 22 - 51 U/L  Protime-INR     Status: None   Collection Time: 08/30/14  7:38 PM  Result Value Ref Range   Prothrombin Time 14.2 11.6 - 15.2 seconds   INR 1.08 0.00 - 1.49  ABO/Rh     Status: None   Collection Time: 08/30/14  7:38 PM  Result Value Ref Range   ABO/RH(D)  O POS   I-Stat CG4 Lactic Acid, ED     Status: None   Collection Time: 08/30/14  7:48 PM  Result Value Ref Range   Lactic Acid, Venous 0.83 0.5 - 2.0 mmol/L  Urinalysis, Routine w reflex microscopic (not at [facility])     Status: Abnormal   Collection Time: 08/30/14  8:41 PM  Result Value Ref Range   Color, Urine AMBER (A) YELLOW    Comment: BIOCHEMICALS MAY BE AFFECTED BY COLOR   APPearance CLEAR CLEAR   Specific Gravity, Urine 1.018 1.005 - 1.030   pH 5.0 5.0 - 8.0   Glucose, UA NEGATIVE NEGATIVE mg/dL   Hgb urine dipstick NEGATIVE NEGATIVE   Bilirubin Urine NEGATIVE NEGATIVE   Ketones, ur 15 (A) NEGATIVE mg/dL   Protein, ur NEGATIVE NEGATIVE mg/dL   Urobilinogen, UA 0.2 0.0 - 1.0 mg/dL   Nitrite NEGATIVE NEGATIVE   Leukocytes, UA TRACE (A) NEGATIVE  Urine microscopic-add on     Status: Abnormal   Collection Time: 08/30/14  8:41 PM  Result Value Ref Range   Squamous Epithelial / LPF RARE RARE   WBC, UA 0-2 <3 WBC/hpf   RBC / HPF 0-2 <3 RBC/hpf   Bacteria, UA RARE RARE   Casts HYALINE CASTS (A) NEGATIVE  I-Stat CG4 Lactic Acid, ED     Status: None   Collection Time: 08/31/14 12:35 AM  Result Value Ref Range   Lactic Acid, Venous 1.38 0.5 - 2.0 mmol/L  Amylase     Status: Abnormal   Collection Time: 08/31/14  1:47 AM  Result Value Ref Range   Amylase 105 (H) 28 - 100 U/L   Ct Cta Abd/pel W/cm &/or W/o Cm  08/31/2014   CLINICAL DATA:  Rectal bleeding and abdominal pain. Left lower quadrant abdominal pain. Intermittent vomiting. History of ischemic colitis.  EXAM: CTA ABDOMEN AND PELVIS wITHOUT AND WITH CONTRAST  TECHNIQUE: Multidetector CT imaging of the abdomen and pelvis was performed using the standard protocol during bolus administration of intravenous contrast. Multiplanar reconstructed images and MIPs were obtained and reviewed to evaluate the vascular anatomy.  CONTRAST:  19mL OMNIPAQUE IOHEXOL 350 MG/ML SOLN  COMPARISON:  CT 04/09/2012  FINDINGS: Atelectasis or scarring in  the right middle lobe. Mild motion artifact through the lung bases.  Normal caliber abdominal aorta without aneurysm or dissection. Celiac, superior mesenteric, and inferior mesenteric arteries are patent. There is no abrupt occlusion. Single bilateral renal arteries are patent.  There is diffuse colonic wall thickening from the mid transverse colon distally through the descending colon. Mild associated pericolonic inflammatory change. There is no evidence of active extravasation. Small volume of stool in the proximal colon. There is no small bowel dilatation. Small hiatal hernia. Stomach is decompressed. No free air, free fluid, or intra-abdominal fluid collection.  Scattered hepatic granuloma, no focal hepatic lesion. The gallbladder, spleen, and right adrenal gland are normal. There is a punctate left adrenal calcification. The pancreas is normal. Parapelvic cysts in the left kidney. Kidneys demonstrate symmetric enhancement. Tiny fat containing umbilical hernia. No retroperitoneal or mesenteric adenopathy.  Urinary bladder is physiologically distended. The uterus and adnexa are normal for age. There is no pelvic free fluid.  There are no acute or suspicious osseous abnormalities. There is facet arthropathy in the lower lumbar spine with minimal anterolisthesis of L4 on L5, likely degenerative.  Review of the MIP images confirms the above findings.  IMPRESSION: Colitis extending from the mid  transverse to the distal descending colon. No active extravasation or broke arterial occlusion. This may be ischemic, infectious or inflammatory.   Electronically Signed   By: Jeb Levering M.D.   On: 08/31/2014 00:36    Review of Systems  Constitutional: Positive for chills. Negative for diaphoresis.  Eyes: Negative for double vision and photophobia.  Respiratory: Negative for cough, hemoptysis and sputum production.   Cardiovascular: Negative for chest pain, palpitations, orthopnea and leg swelling.   Gastrointestinal: Positive for nausea, vomiting, abdominal pain, diarrhea and blood in stool.  Genitourinary: Negative for dysuria and urgency.  Neurological: Negative for dizziness and headaches.    Blood pressure 134/70, pulse 96, temperature 98.2 F (36.8 C), temperature source Oral, resp. rate 18, height 5' 3.5" (1.613 m), weight 79.379 kg (175 lb), SpO2 96 %. Physical Exam  Constitutional: She is oriented to person, place, and time.  HENT:  Head: Normocephalic and atraumatic.  Eyes: Conjunctivae are normal. Pupils are equal, round, and reactive to light. Left eye exhibits no discharge. No scleral icterus.  Neck: Normal range of motion. Neck supple. No JVD present. No tracheal deviation present. No thyromegaly present.  Cardiovascular: Normal rate and regular rhythm.   Murmur (Soft systolic murmur noted) heard. Respiratory: Effort normal and breath sounds normal. No respiratory distress. She has no wheezes. She has no rales.  GI: Bowel sounds are normal. There is tenderness (Left liver quadrant tenderness present). There is no rebound and no guarding.  Musculoskeletal: She exhibits no edema or tenderness.  Neurological: She is alert and oriented to person, place, and time.     Assessment/Plan Acute colitis with lower GI bleed rule out ischemic versus infectious History of infectious colitis in the past Hypertension History of paroxysmal atrial fibrillation Mild obstructive sleep apnea GERD History of CA of breast Osteoporosis Plan As per orders GI consult  Charolette Forward 08/31/2014, 7:03 AM

## 2014-08-31 NOTE — Consult Note (Signed)
Referring Provider: Dr. Terrence Dupont Primary Care Physician:  Charolette Forward, MD Primary Gastroenterologist:  Dr.  Cristina Gong   Reason for Consultation:  Colitis and abdominal pain  HPI: Wanda Green is a 80 y.o. female who had the abrupt onset, while at work yesterday, of low abdominal pain followed by diarrhea that became bloody. She came to the emergency room and was admitted very early this morning. Her abdominal pain is doing a little bit better at this time but she is continuing to pass frequent bloody stools.  About 2 years ago, she had abdominal pain with diarrhea and vomiting that was attributed to an infectious process. At that time colonoscopy was not performed, but a CT scan showed proximal colonic thickening.  Colonoscopy by me in 2012 was unrevealing for any mucosal abnormalities.  In 2001, the patient had colonoscopy by Dr. Collene Mares that showed erythema at the splenic flexure region with biopsies consistent with ischemia.  The patient is a nonsmoker and does not have any known vascular disease.  A CT scan on this admission shows left-sided colitis and she has mild leukocytosis.  Past Medical History  Diagnosis Date  . Breast cancer   . Hypertension   . Osteoporosis 06/2012    T score -2.5  . Colitis, ischemic   . Elevated cholesterol   . Cervical dysplasia age 25  . Arthritis   . Sleep apnea     Past Surgical History  Procedure Laterality Date  . Bi lateral knee scope    . Breast surgery  2000    RIGHT BREAST LUMPECTOMY  . Tubal ligation    . Colposcopy    . Cervical cone biopsy  age 52  . Nasal sinus surgery  12/14    Prior to Admission medications   Medication Sig Start Date End Date Taking? Authorizing Provider  acetaminophen (TYLENOL) 500 MG tablet Take 500 mg by mouth every 6 (six) hours as needed for mild pain or moderate pain.   Yes Historical Provider, MD  amLODipine (NORVASC) 2.5 MG tablet Take 2.5 mg by mouth daily. 08/12/14  Yes Historical Provider, MD   atorvastatin (LIPITOR) 20 MG tablet Take 20 mg by mouth every other day.    Yes Historical Provider, MD  azithromycin (ZITHROMAX) 250 MG tablet Take 250 mg by mouth daily. 08/09/14  Yes Historical Provider, MD  cetirizine (ZYRTEC) 10 MG tablet Take 10 mg by mouth at bedtime.   Yes Historical Provider, MD  losartan (COZAAR) 100 MG tablet Take 100 mg by mouth daily.   Yes Historical Provider, MD  metoprolol succinate (TOPROL-XL) 25 MG 24 hr tablet Take 25-50 mg by mouth daily. 25 am and 50mg  in pm   Yes Historical Provider, MD  omeprazole (PRILOSEC) 40 MG capsule Take 1 capsule (40 mg total) by mouth daily as needed. 07/14/12  Yes Leandrew Koyanagi, MD  oxybutynin (DITROPAN-XL) 10 MG 24 hr tablet TAKE 1 TABLET (10 MG TOTAL) BY MOUTH AT BEDTIME. 02/18/14  Yes Anastasio Auerbach, MD  meloxicam (MOBIC) 15 MG tablet Take 1 tablet by mouth daily with breakfast for 2 weeks then when necessary Patient not taking: Reported on 08/30/2014 03/23/14   Deanna M Didiano, DO  traMADol (ULTRAM) 50 MG tablet Take 1 tablet (50 mg total) by mouth every 12 (twelve) hours as needed for severe pain. Patient not taking: Reported on 08/30/2014 03/23/14   Deanna M Didiano, DO    Current Facility-Administered Medications  Medication Dose Route Frequency Provider Last Rate Last Dose  .  0.9 %  sodium chloride infusion   Intravenous Continuous Charolette Forward, MD 50 mL/hr at 08/31/14 0854    . atorvastatin (LIPITOR) tablet 20 mg  20 mg Oral QODAY Charolette Forward, MD   20 mg at 08/31/14 1027  . ciprofloxacin (CIPRO) IVPB 400 mg  400 mg Intravenous Q12H Jake Church Masters, RPH   400 mg at 08/31/14 1146  . losartan (COZAAR) tablet 100 mg  100 mg Oral Daily Charolette Forward, MD   100 mg at 08/31/14 1027  . metoprolol succinate (TOPROL-XL) 24 hr tablet 25 mg  25 mg Oral Daily Wendee Beavers, RPH   25 mg at 08/31/14 1027  . metroNIDAZOLE (FLAGYL) IVPB 500 mg  500 mg Intravenous 4 times per day Charolette Forward, MD   500 mg at 08/31/14 1033  .  morphine 2 MG/ML injection 2 mg  2 mg Intravenous Q4H PRN Charolette Forward, MD      . pantoprazole (PROTONIX) EC tablet 40 mg  40 mg Oral Daily Charolette Forward, MD   40 mg at 08/31/14 1027    Allergies as of 08/30/2014 - Review Complete 08/30/2014  Allergen Reaction Noted  . Augmentin [amoxicillin-pot clavulanate]  08/30/2014  . Hydrocodone Nausea And Vomiting 08/30/2014  . Oxycodone-acetaminophen  08/10/2010  . Penicillins  08/30/2014  . Codeine Nausea And Vomiting 05/29/2012  . Doxycycline Nausea And Vomiting 08/30/2014    Family History  Problem Relation Age of Onset  . Hypertension Mother   . Heart failure Mother   . Heart disease Mother   . Hypertension Father   . Heart disease Father   . Hypertension Sister   . Uterine cancer Sister   . Hypertension Brother     History   Social History  . Marital Status: Widowed    Spouse Name: N/A  . Number of Children: N/A  . Years of Education: N/A   Occupational History  . part time [employer] History Main Topics  . Smoking status: Former Smoker -- 0.10 packs/day for 3 years    Types: Cigarettes    Quit date: 03/05/1972  . Smokeless tobacco: Never Used     Comment: ONE CIG A DAY  . Alcohol Use: No  . Drug Use: No  . Sexual Activity: No   Other Topics Concern  . Not on file   Social History Narrative    Review of Systems: Globally negative. No arthritis, anorexia or weight loss mood disorders, chest pain, shortness of breath, urinary symptoms, skin problems  Physical Exam: Vital signs in last 24 hours: Temp:  [97.8 F (36.6 C)-99.7 F (37.6 C)] 99.7 F (37.6 C) (06/28 1712) Pulse Rate:  [52-96] 88 (06/28 1712) Resp:  [13-26] 20 (06/28 1712) BP: (87-134)/(26-76) 127/53 mmHg (06/28 1712) SpO2:  [95 %-100 %] 96 % (06/28 1712) Weight:  [79.379 kg (175 lb)] 79.379 kg (175 lb) (06/27 1814) Last BM Date: 08/30/14 General:   Alert,  Well-developed, well-nourished, pleasant and cooperative in NAD Head:   Normocephalic and atraumatic. Eyes:  Sclera clear, no icterus.   Conjunctiva pink. Mouth:   No ulcerations or lesions.  Oropharynx pink & moist. Neck:   No masses or thyromegaly. Lungs:  Clear throughout to auscultation.   No wheezes, crackles, or rhonchi. No evident respiratory distress. Heart:   Regular rate and rhythm; no murmurs, clicks, rubs,  or gallops. Abdomen:  Soft, nontympanitic, and nondistended. Mild subjective left lower quadrant tenderness. No masses, hepatosplenomegaly or ventral hernias noted. Quiet bowel sounds, without  bruits, guarding, or rebound.   Msk:   Symmetrical without gross deformities. Pulses:  Normal radial pulse is noted. Extremities:   Without clubbing, cyanosis, or edema. Neurologic:  Alert and coherent;  grossly normal neurologically. Skin:  Intact without significant lesions or rashes. Cervical Nodes:  No significant cervical adenopathy. Psych:   Alert and cooperative. Normal mood and affect.  Intake/Output from previous day: 06/27 0701 - 06/28 0700 In: 2000 [I.V.:2000] Out: -  Intake/Output this shift: Total I/O In: 1688.8 [P.O.:360; I.V.:728.8; IV Piggyback:600] Out: 900 [Urine:900]  Lab Results:  Recent Labs  08/30/14 1938 08/31/14 1058  WBC 12.6* 13.4*  HGB 10.7* 10.1*  HCT 32.7* 30.2*  PLT 179 170   BMET  Recent Labs  08/30/14 1938 08/31/14 1058  NA 138 138  K 4.0 3.6  CL 108 109  CO2 17* 21*  GLUCOSE 131* 131*  BUN 25* 13  CREATININE 1.14* 1.08*  CALCIUM 9.1 8.4*   LFT  Recent Labs  08/31/14 1058  PROT 6.4*  ALBUMIN 3.5  AST 17  ALT 12*  ALKPHOS 39  BILITOT 0.8   PT/INR  Recent Labs  08/30/14 1938  LABPROT 14.2  INR 1.08    Studies/Results: Ct Cta Abd/pel W/cm &/or W/o Cm  08/31/2014   CLINICAL DATA:  Rectal bleeding and abdominal pain. Left lower quadrant abdominal pain. Intermittent vomiting. History of ischemic colitis.  EXAM: CTA ABDOMEN AND PELVIS wITHOUT AND WITH CONTRAST  TECHNIQUE: Multidetector  CT imaging of the abdomen and pelvis was performed using the standard protocol during bolus administration of intravenous contrast. Multiplanar reconstructed images and MIPs were obtained and reviewed to evaluate the vascular anatomy.  CONTRAST:  161mL OMNIPAQUE IOHEXOL 350 MG/ML SOLN  COMPARISON:  CT 04/09/2012  FINDINGS: Atelectasis or scarring in the right middle lobe. Mild motion artifact through the lung bases.  Normal caliber abdominal aorta without aneurysm or dissection. Celiac, superior mesenteric, and inferior mesenteric arteries are patent. There is no abrupt occlusion. Single bilateral renal arteries are patent.  There is diffuse colonic wall thickening from the mid transverse colon distally through the descending colon. Mild associated pericolonic inflammatory change. There is no evidence of active extravasation. Small volume of stool in the proximal colon. There is no small bowel dilatation. Small hiatal hernia. Stomach is decompressed. No free air, free fluid, or intra-abdominal fluid collection.  Scattered hepatic granuloma, no focal hepatic lesion. The gallbladder, spleen, and right adrenal gland are normal. There is a punctate left adrenal calcification. The pancreas is normal. Parapelvic cysts in the left kidney. Kidneys demonstrate symmetric enhancement. Tiny fat containing umbilical hernia. No retroperitoneal or mesenteric adenopathy.  Urinary bladder is physiologically distended. The uterus and adnexa are normal for age. There is no pelvic free fluid.  There are no acute or suspicious osseous abnormalities. There is facet arthropathy in the lower lumbar spine with minimal anterolisthesis of L4 on L5, likely degenerative.  Review of the MIP images confirms the above findings.  IMPRESSION: Colitis extending from the mid transverse to the distal descending colon. No active extravasation or broke arterial occlusion. This may be ischemic, infectious or inflammatory.   Electronically Signed   By:  Jeb Levering M.D.   On: 08/31/2014 00:36    Impression: The clinical and radiographic impression strongly supports the idea of ischemic colitis. If so, it appears to be of moderate severity.  Plan: I discussed with the patient the option of empiric supportive care, versus verification of the diagnosis by doing a limited colonoscopy,  the risk of which might be a little bit higher than doing a colonoscopy in an uninflamed colon. The patient would prefer to have verification of her diagnosis, so we will proceed with an unprepped partial colonoscopy tomorrow.   In the meantime, with her leukocytosis, antibiotic therapy is reasonable although I am hopeful that we will be able to discontinue that if and when the colonoscopic appearance is classic for ischemic colitis.     LOS: 0 days   Mayah Urquidi V  08/31/2014, 5:25 PM   Pager [phone] If no answer or after 5 PM call [phone]

## 2014-08-31 NOTE — ED Notes (Signed)
Phlebotomy at bedside.

## 2014-08-31 NOTE — Progress Notes (Signed)
Called ED RN for report. Room ready for admit.

## 2014-08-31 NOTE — Plan of Care (Signed)
Problem: Phase I Progression Outcomes Goal: OOB as tolerated unless otherwise ordered Outcome: Completed/Met Date Met:  08/31/14 Ambulating to bathroom, around room, and in hall.  Goal: Initial discharge plan identified Outcome: Completed/Met Date Met:  08/31/14 Return home with daughter.

## 2014-08-31 NOTE — Progress Notes (Signed)
Dr. Terrence Dupont notified of patient's admission to unit 6E30.Per MD,he will see patient in the am. Mingo Amber, RN

## 2014-08-31 NOTE — Progress Notes (Signed)
ANTIBIOTIC CONSULT NOTE - INITIAL  Pharmacy Consult for ciprofloxacin Indication: intra-abdominal infection  Allergies  Allergen Reactions  . Augmentin [Amoxicillin-Pot Clavulanate]     Mouth pain  . Hydrocodone Nausea And Vomiting  . Oxycodone-Acetaminophen   . Penicillins     bleeding  . Codeine Nausea And Vomiting  . Doxycycline Nausea And Vomiting    Patient Measurements: Height: 5' 3.5" (161.3 cm) Weight: 175 lb (79.379 kg) IBW/kg (Calculated) : 53.55 Adjusted Body Weight:   Vital Signs: Temp: 98.4 F (36.9 C) (06/28 0802) Temp Source: Oral (06/28 0802) BP: 109/47 mmHg (06/28 0802) Pulse Rate: 85 (06/28 0802) Intake/Output from previous day: 06/27 0701 - 06/28 0700 In: 2000 [I.V.:2000] Out: -  Intake/Output from this shift: Total I/O In: 773.8 [I.V.:473.8; IV Piggyback:300] Out: -   Labs:  Recent Labs  08/30/14 1938  WBC 12.6*  HGB 10.7*  PLT 179  CREATININE 1.14*   Estimated Creatinine Clearance: 41.7 mL/min (by C-G formula based on Cr of 1.14). No results for input(s): VANCOTROUGH, VANCOPEAK, VANCORANDOM, GENTTROUGH, GENTPEAK, GENTRANDOM, TOBRATROUGH, TOBRAPEAK, TOBRARND, AMIKACINPEAK, AMIKACINTROU, AMIKACIN in the last 72 hours.   Microbiology: No results found for this or any previous visit (from the past 720 hour(s)).  Medical History: Past Medical History  Diagnosis Date  . Breast cancer   . Hypertension   . Osteoporosis 06/2012    T score -2.5  . Colitis, ischemic   . Elevated cholesterol   . Cervical dysplasia age 6  . Arthritis   . Sleep apnea     Medications:  Prescriptions prior to admission  Medication Sig Dispense Refill Last Dose  . acetaminophen (TYLENOL) 500 MG tablet Take 500 mg by mouth every 6 (six) hours as needed for mild pain or moderate pain.   Past Week at Unknown time  . amLODipine (NORVASC) 2.5 MG tablet Take 2.5 mg by mouth daily.  3 08/30/2014 at Unknown time  . atorvastatin (LIPITOR) 20 MG tablet Take 20 mg by  mouth every other day.    08/28/2014  . azithromycin (ZITHROMAX) 250 MG tablet Take 250 mg by mouth daily.  2 08/30/2014 at Unknown time  . cetirizine (ZYRTEC) 10 MG tablet Take 10 mg by mouth at bedtime.   08/29/2014 at Unknown time  . losartan (COZAAR) 100 MG tablet Take 100 mg by mouth daily.   08/30/2014 at Unknown time  . metoprolol succinate (TOPROL-XL) 25 MG 24 hr tablet Take 25-50 mg by mouth daily. 25 am and 50mg  in pm   08/30/2014 at 900  . omeprazole (PRILOSEC) 40 MG capsule Take 1 capsule (40 mg total) by mouth daily as needed. 90 capsule 3 08/28/2014  . oxybutynin (DITROPAN-XL) 10 MG 24 hr tablet TAKE 1 TABLET (10 MG TOTAL) BY MOUTH AT BEDTIME. 30 tablet 12 08/29/2014 at Unknown time  . meloxicam (MOBIC) 15 MG tablet Take 1 tablet by mouth daily with breakfast for 2 weeks then when necessary (Patient not taking: Reported on 08/30/2014) 14 tablet 0 Not Taking at Unknown time  . traMADol (ULTRAM) 50 MG tablet Take 1 tablet (50 mg total) by mouth every 12 (twelve) hours as needed for severe pain. (Patient not taking: Reported on 08/30/2014) 30 tablet 0 Not Taking at Unknown time   Assessment: 80 yo female admitted with L lower quadrant abdomina pain. Initiating abx for possible intraabdominal infection. WBC 12.6, afeb, bp soft.   Goal of Therapy:  Resolution of infection  Plan:  Ciprofloxacin 400 mg IV q12h   Hughes Better, PharmD,  BCPS Clinical Pharmacist Pager: [phone] 08/31/2014 9:05 AM

## 2014-08-31 NOTE — Progress Notes (Signed)
Continues to have small amount of rectal bleeding noted while she was in the bathroom. Denies pain or discomfort.

## 2014-08-31 NOTE — Progress Notes (Signed)
Patient scheduled for colonoscopy 09/01/14 @ 1345. Telephone call to Dr. Cristina Gong to clarify prep. No prep ordered. Pt will be npo after midnight. Consent signed by patient. Will continue to monitor.Bartholomew Crews, RN

## 2014-09-01 ENCOUNTER — Inpatient Hospital Stay (HOSPITAL_COMMUNITY): Payer: PPO | Admitting: Anesthesiology

## 2014-09-01 ENCOUNTER — Encounter (HOSPITAL_COMMUNITY)
Admission: EM | Disposition: A | Discharge status: Home / Self Care | Payer: Self-pay | Source: Home / Self Care | Attending: Cardiology

## 2014-09-01 ENCOUNTER — Encounter (HOSPITAL_COMMUNITY): Payer: Self-pay | Admitting: *Deleted

## 2014-09-01 HISTORY — PX: COLONOSCOPY WITH PROPOFOL: SHX5780

## 2014-09-01 LAB — URINE CULTURE

## 2014-09-01 LAB — BASIC METABOLIC PANEL
Anion gap: 8 (ref 5–15)
BUN: 7 mg/dL (ref 6–20)
CHLORIDE: 108 mmol/L (ref 101–111)
CO2: 24 mmol/L (ref 22–32)
Calcium: 8.4 mg/dL — ABNORMAL LOW (ref 8.9–10.3)
Creatinine, Ser: 1.03 mg/dL — ABNORMAL HIGH (ref 0.44–1.00)
GFR calc Af Amer: 59 mL/min — ABNORMAL LOW (ref >60)
GFR calc non Af Amer: 51 mL/min — ABNORMAL LOW (ref >60)
Glucose, Bld: 110 mg/dL — ABNORMAL HIGH (ref 65–99)
POTASSIUM: 3.3 mmol/L — AB (ref 3.5–5.1)
Sodium: 140 mmol/L (ref 135–145)

## 2014-09-01 LAB — CBC
HCT: 26.9 % — ABNORMAL LOW (ref 36.0–46.0)
Hemoglobin: 8.9 g/dL — ABNORMAL LOW (ref 12.0–15.0)
MCH: 30 pg (ref 26.0–34.0)
MCHC: 33.1 g/dL (ref 30.0–36.0)
MCV: 90.6 fL (ref 78.0–100.0)
PLATELETS: 162 10*3/uL (ref 150–400)
RBC: 2.97 MIL/uL — ABNORMAL LOW (ref 3.87–5.11)
RDW: 14.2 % (ref 11.5–15.5)
WBC: 12 10*3/uL — ABNORMAL HIGH (ref 4.0–10.5)

## 2014-09-01 SURGERY — COLONOSCOPY WITH PROPOFOL
Anesthesia: Monitor Anesthesia Care

## 2014-09-01 MED ORDER — LACTATED RINGERS IV SOLN
INTRAVENOUS | Status: DC | PRN
  Administered 2014-09-01: 13:00:00 via INTRAVENOUS

## 2014-09-01 MED ORDER — PROPOFOL INFUSION 10 MG/ML OPTIME
INTRAVENOUS | Status: DC | PRN
  Administered 2014-09-01: 140 ug/kg/min via INTRAVENOUS

## 2014-09-01 MED ORDER — POTASSIUM CHLORIDE CRYS ER 20 MEQ PO TBCR
40.0000 meq | EXTENDED_RELEASE_TABLET | Freq: Once | ORAL | Status: AC
  Administered 2014-09-01: 40 meq via ORAL
  Filled 2014-09-01: qty 2

## 2014-09-01 MED ORDER — MIDAZOLAM HCL 5 MG/5ML IJ SOLN
INTRAMUSCULAR | Status: DC | PRN
  Administered 2014-09-01: 2 mg via INTRAVENOUS

## 2014-09-01 NOTE — Anesthesia Postprocedure Evaluation (Signed)
  Anesthesia Post-op Note  Patient: Wanda Green  Procedure(s) Performed: Procedure(s) with comments: COLONOSCOPY WITH PROPOFOL (N/A) - this will be done unprepped  Patient Location: PACU  Anesthesia Type: MAC  Level of Consciousness: awake and alert   Airway and Oxygen Therapy: Patient Spontanous Breathing  Post-op Pain: Controlled  Post-op Assessment: Post-op Vital signs reviewed, Patient's Cardiovascular Status Stable and Respiratory Function Stable  Post-op Vital Signs: Reviewed  Filed Vitals:   09/01/14 1430  BP: 125/57  Pulse: 78  Temp: 36.7 C  Resp: 17    Complications: No apparent anesthesia complications

## 2014-09-01 NOTE — Progress Notes (Signed)
Patient's colonoscopy showed the expected findings of left-sided colitis that looks ischemic in character (biopsies are pending but will probably confirm that impression).  The patient feels better today, the colitis does not look too severe, so I have gone ahead and ordered a regular diet for the patient.  If she feels well tomorrow and her blood work results are okay and she has tolerated solid food, I feel that discharge tomorrow would be appropriate.  Ordinarily, this type of colitis, even when recurrent, does not require a follow-up colonoscopy or initiation of anticoagulation or a vascular workup, as long as she becomes asymptomatic and Hemoccult-negative following discharge.  I have made the patient a follow-up appointment with me for July 14th at 1:30.  Since this does not appear to be an infectious process and since she is improving clinically, I do not feel that antibiotic therapy is necessary and therefore I have gone ahead and stopped her Cipro and Flagyl.  Please call me if you have any questions.  Cleotis Nipper, M.D. Pager [phone] If no answer or after 5 PM call [phone]

## 2014-09-01 NOTE — Transfer of Care (Signed)
Immediate Anesthesia Transfer of Care Note  Patient: Wanda Green  Procedure(s) Performed: Procedure(s) with comments: COLONOSCOPY WITH PROPOFOL (N/A) - this will be done unprepped  Patient Location: PACU  Anesthesia Type:MAC  Level of Consciousness: awake, alert , oriented and patient cooperative  Airway & Oxygen Therapy: Patient Spontanous Breathing and Patient connected to face mask oxygen  Post-op Assessment: Report given to RN, Post -op Vital signs reviewed and stable and Patient moving all extremities  Post vital signs: Reviewed and stable  Last Vitals:  Filed Vitals:   09/01/14 1319  BP: 148/74  Pulse: 86  Temp: 36.8 C  Resp: 24    Complications: No apparent anesthesia complications

## 2014-09-01 NOTE — Progress Notes (Signed)
Subjective: Appreciate GI consult and help Patient denies any chest pain or shortness of breath states abdominal pain has improved no further rectal bleeding. Scheduled for colonoscopy today  Objective:  Vital Signs in the last 24 hours: Temp:  [98.6 F (37 C)-99.7 F (37.6 C)] 98.6 F (37 C) (06/29 0500) Pulse Rate:  [78-93] 91 (06/29 0500) Resp:  [18-20] 18 (06/29 0500) BP: (115-136)/(53-75) 115/59 mmHg (06/29 0500) SpO2:  [96 %-98 %] 96 % (06/29 0500) Weight:  [80.922 kg (178 lb 6.4 oz)] 80.922 kg (178 lb 6.4 oz) (06/28 2040)  Intake/Output from previous day: 06/28 0701 - 06/29 0700 In: 2328.8 [P.O.:600; I.V.:728.8; IV Piggyback:1000] Out: 1700 [Urine:1700] Intake/Output from this shift: Total I/O In: -  Out: 300 [Urine:300]  Physical Exam: Neck: no adenopathy, no carotid bruit, no JVD and supple, symmetrical, trachea midline Lungs: clear to auscultation bilaterally Heart: regular rate and rhythm, S1, S2 normal and Soft systolic murmur noted no S3 gallop Abdomen: Soft bowel sounds present and mild left lower quadrant tenderness noted no guarding Extremities: extremities normal, atraumatic, no cyanosis or edema  Lab Results:  Recent Labs  08/31/14 1058 09/01/14 0525  WBC 13.4* 12.0*  HGB 10.1* 8.9*  PLT 170 162    Recent Labs  08/31/14 1058 09/01/14 0525  NA 138 140  K 3.6 3.3*  CL 109 108  CO2 21* 24  GLUCOSE 131* 110*  BUN 13 7  CREATININE 1.08* 1.03*   No results for input(s): TROPONINI in the last 72 hours.  Invalid input(s): CK, MB Hepatic Function Panel  Recent Labs  08/31/14 1058  PROT 6.4*  ALBUMIN 3.5  AST 17  ALT 12*  ALKPHOS 39  BILITOT 0.8   No results for input(s): CHOL in the last 72 hours. No results for input(s): PROTIME in the last 72 hours.  Imaging: Imaging results have been reviewed and Ct Cta Abd/pel W/cm &/or W/o Cm  08/31/2014   CLINICAL DATA:  Rectal bleeding and abdominal pain. Left lower quadrant abdominal pain.  Intermittent vomiting. History of ischemic colitis.  EXAM: CTA ABDOMEN AND PELVIS wITHOUT AND WITH CONTRAST  TECHNIQUE: Multidetector CT imaging of the abdomen and pelvis was performed using the standard protocol during bolus administration of intravenous contrast. Multiplanar reconstructed images and MIPs were obtained and reviewed to evaluate the vascular anatomy.  CONTRAST:  151mL OMNIPAQUE IOHEXOL 350 MG/ML SOLN  COMPARISON:  CT 04/09/2012  FINDINGS: Atelectasis or scarring in the right middle lobe. Mild motion artifact through the lung bases.  Normal caliber abdominal aorta without aneurysm or dissection. Celiac, superior mesenteric, and inferior mesenteric arteries are patent. There is no abrupt occlusion. Single bilateral renal arteries are patent.  There is diffuse colonic wall thickening from the mid transverse colon distally through the descending colon. Mild associated pericolonic inflammatory change. There is no evidence of active extravasation. Small volume of stool in the proximal colon. There is no small bowel dilatation. Small hiatal hernia. Stomach is decompressed. No free air, free fluid, or intra-abdominal fluid collection.  Scattered hepatic granuloma, no focal hepatic lesion. The gallbladder, spleen, and right adrenal gland are normal. There is a punctate left adrenal calcification. The pancreas is normal. Parapelvic cysts in the left kidney. Kidneys demonstrate symmetric enhancement. Tiny fat containing umbilical hernia. No retroperitoneal or mesenteric adenopathy.  Urinary bladder is physiologically distended. The uterus and adnexa are normal for age. There is no pelvic free fluid.  There are no acute or suspicious osseous abnormalities. There is facet arthropathy in the  lower lumbar spine with minimal anterolisthesis of L4 on L5, likely degenerative.  Review of the MIP images confirms the above findings.  IMPRESSION: Colitis extending from the mid transverse to the distal descending colon. No  active extravasation or broke arterial occlusion. This may be ischemic, infectious or inflammatory.   Electronically Signed   By: Jeb Levering M.D.   On: 08/31/2014 00:36    Cardiac Studies:  Assessment/Plan:  Acute colitis with lower GI bleed rule out ischemic versus infectious History of infectious colitis in the past Hypertension History of paroxysmal atrial fibrillation Mild obstructive sleep apnea GERD History of CA of breast Osteoporosis Hypokalemia Plan Schedule for colonoscopy today Replace K Check labs in a.m.  LOS: 1 day    Charolette Forward 09/01/2014, 8:14 AM

## 2014-09-01 NOTE — Anesthesia Procedure Notes (Signed)
Procedure Name: MAC Date/Time: 09/01/2014 2:00 PM Performed by: Rush Farmer E Pre-anesthesia Checklist: Patient identified, Emergency Drugs available, Suction available, Patient being monitored and Timeout performed Oxygen Delivery Method: Simple face mask Intubation Type: IV induction Placement Confirmation: positive ETCO2

## 2014-09-01 NOTE — Anesthesia Preprocedure Evaluation (Addendum)
Anesthesia Evaluation  Patient identified by MRN, date of birth, ID band Patient awake    Reviewed: Allergy & Precautions, H&P , NPO status , Patient's Chart, lab work & pertinent test results, reviewed documented beta blocker date and time   Airway Mallampati: III  TM Distance: >3 FB Neck ROM: Full    Dental no notable dental hx. (+) Teeth Intact, Dental Advisory Given   Pulmonary sleep apnea and Continuous Positive Airway Pressure Ventilation , former smoker,  breath sounds clear to auscultation  Pulmonary exam normal       Cardiovascular hypertension, Pt. on medications and Pt. on home beta blockers negative cardio ROS  Rhythm:Regular Rate:Normal + Systolic murmurs    Neuro/Psych negative neurological ROS  negative psych ROS   GI/Hepatic negative GI ROS, Neg liver ROS,   Endo/Other  negative endocrine ROS  Renal/GU negative Renal ROS  negative genitourinary   Musculoskeletal  (+) Arthritis -, Osteoarthritis,    Abdominal   Peds  Hematology negative hematology ROS (+)   Anesthesia Other Findings   Reproductive/Obstetrics negative OB ROS                           Anesthesia Physical Anesthesia Plan  ASA: III  Anesthesia Plan: MAC   Post-op Pain Management:    Induction: Intravenous  Airway Management Planned: Simple Face Mask  Additional Equipment:   Intra-op Plan:   Post-operative Plan:   Informed Consent: I have reviewed the patients History and Physical, chart, labs and discussed the procedure including the risks, benefits and alternatives for the proposed anesthesia with the patient or authorized representative who has indicated his/her understanding and acceptance.   Dental advisory given  Plan Discussed with: CRNA  Anesthesia Plan Comments:         Anesthesia Quick Evaluation

## 2014-09-02 ENCOUNTER — Encounter (HOSPITAL_COMMUNITY): Payer: Self-pay | Admitting: Gastroenterology

## 2014-09-02 LAB — CBC
HCT: 27.6 % — ABNORMAL LOW (ref 36.0–46.0)
Hemoglobin: 9.2 g/dL — ABNORMAL LOW (ref 12.0–15.0)
MCH: 30.1 pg (ref 26.0–34.0)
MCHC: 33.3 g/dL (ref 30.0–36.0)
MCV: 90.2 fL (ref 78.0–100.0)
Platelets: 179 10*3/uL (ref 150–400)
RBC: 3.06 MIL/uL — ABNORMAL LOW (ref 3.87–5.11)
RDW: 14.3 % (ref 11.5–15.5)
WBC: 10.2 10*3/uL (ref 4.0–10.5)

## 2014-09-02 LAB — BASIC METABOLIC PANEL
ANION GAP: 5 (ref 5–15)
BUN: 6 mg/dL (ref 6–20)
CO2: 24 mmol/L (ref 22–32)
Calcium: 8.5 mg/dL — ABNORMAL LOW (ref 8.9–10.3)
Chloride: 110 mmol/L (ref 101–111)
Creatinine, Ser: 0.97 mg/dL (ref 0.44–1.00)
GFR calc Af Amer: >60 mL/min (ref >60)
GFR calc non Af Amer: 55 mL/min — ABNORMAL LOW (ref >60)
GLUCOSE: 105 mg/dL — AB (ref 65–99)
POTASSIUM: 3.6 mmol/L (ref 3.5–5.1)
Sodium: 139 mmol/L (ref 135–145)

## 2014-09-02 LAB — URINE CULTURE

## 2014-09-02 LAB — MAGNESIUM: Magnesium: 1.7 mg/dL (ref 1.7–2.4)

## 2014-09-02 NOTE — Discharge Instructions (Signed)
Bloody Diarrhea Bloody diarrhea can be caused by many different conditions. Most of the time bloody diarrhea is the result of food poisoning or minor infections. Bloody diarrhea usually improves over 2 to 3 days of rest and fluid replacement. Other conditions that can cause bloody diarrhea include:  Internal bleeding.  Infection.  Diseases of the bowel and colon. Internal bleeding from an ulcer or bowel disease can be severe and requires hospital care or even surgery. DIAGNOSIS  To find out what is wrong your caregiver may check your:  Stool.  Blood.  Results from a test that looks inside the body (endoscopy). TREATMENT   Get plenty of rest.  Drink enough water and fluids to keep your urine clear or pale yellow.  Do not smoke.  Solid foods and dairy products should be avoided until your illness improves.  As you improve, slowly return to a regular diet with easily-digested foods first. Examples are:  Bananas.  Rice.  Toast.  Crackers. You should only need these for about 2 days before adding more normal foods to your diet.  Avoid spicy or fatty foods as well as caffeine and alcohol for several days.  Medicine to control cramping and diarrhea can relieve symptoms but may prolong some cases of bloody diarrhea. Antibiotics can speed recovery from diarrhea due to some bacterial infections. Call your caregiver if diarrhea does not get better in 3 days. SEEK MEDICAL CARE IF:   You do not improve after 3 days.  Your diarrhea improves but your stool appears black. SEEK IMMEDIATE MEDICAL CARE IF:   You become extremely weak or faint.  You become very sweaty.  You have increased pain or bleeding.  You develop repeated vomiting.  You vomit and you see blood or the vomit looks black in color.  You have a fever. Document Released: 02/19/2005 Document Revised: 05/14/2011 Document Reviewed: 01/21/2009 [facility] Patient Information 2015 [place], [place]. This information is  not intended to replace advice given to you by your health care provider. Make sure you discuss any questions you have with your health care provider. Colitis Colitis is inflammation of the colon. Colitis can be a short-term or long-standing (chronic) illness. Crohn's disease and ulcerative colitis are 2 types of colitis which are chronic. They usually require lifelong treatment. CAUSES  There are many different causes of colitis, including:  Viruses.  Germs (bacteria).  Medicine reactions. SYMPTOMS   Diarrhea.  Intestinal bleeding.  Pain.  Fever.  Throwing up (vomiting).  Tiredness (fatigue).  Weight loss.  Bowel blockage. DIAGNOSIS  The diagnosis of colitis is based on examination and stool or blood tests. X-rays, CT scan, and colonoscopy may also be needed. TREATMENT  Treatment may include:  Fluids given through the vein (intravenously).  Bowel rest (nothing to eat or drink for a period of time).  Medicine for pain and diarrhea.  Medicines (antibiotics) that kill germs.  Cortisone medicines.  Surgery. HOME CARE INSTRUCTIONS   Get plenty of rest.  Drink enough water and fluids to keep your urine clear or pale yellow.  Eat a well-balanced diet.  Call your caregiver for follow-up as recommended. SEEK IMMEDIATE MEDICAL CARE IF:   You develop chills.  You have an oral temperature above 102 F (38.9 C), not controlled by medicine.  You have extreme weakness, fainting, or dehydration.  You have repeated vomiting.  You develop severe belly (abdominal) pain or are passing bloody or tarry stools. MAKE SURE YOU:   Understand these instructions.  Will watch your condition.  Will get help right away if you are not doing well or get worse. Document Released: 03/29/2004 Document Revised: 05/14/2011 Document Reviewed: 06/24/2009 [facility] Patient Information 2015 [place], [place]. This information is not intended to replace advice given to you by your health  care provider. Make sure you discuss any questions you have with your health care provider.

## 2014-09-02 NOTE — Progress Notes (Signed)
Patient feels really good, with just minimal residual symptoms.  Plans for discharge noted. Patient is up, dress, walking around in room without pain, or just minimal discomfort. Vital signs are satisfactory.  Labs show normalization of the white count this morning, 10,200. Hemoglobin stable for the past 24 hours, currently 9.2.  I gave the patient an appointment card for follow-up with me on July 14.  No special diet or medications are needed upon discharge from the standpoint of this episode of apparent ischemic colitis.  We will contact the patient with her biopsy results, which are compatible with ischemic injury.  Cleotis Nipper, M.D. Pager [phone] If no answer or after 5 PM call [phone]

## 2014-09-02 NOTE — Care Management (Signed)
Important Message  Patient Details  Name: VIDEL NOBREGA MRN: [id number] Date of Birth: 03-21-1934   Medicare Important Message Given:  Yes-second notification given    Delorse Lek 09/02/2014, 10:44 AM

## 2014-09-02 NOTE — Discharge Summary (Signed)
Discharge summary dictated on 09/02/2014 dictation number is [phone]

## 2014-09-02 NOTE — Progress Notes (Signed)
Patient discharged home with daughter. Patient given discharge paperwork and education on colitis and rectal bleeding. Patient was discharged with belongings and stable upon discharge.

## 2014-09-02 NOTE — Discharge Summary (Signed)
[name], [name]               ACCOUNT NO.:  [id number]  MEDICAL RECORD NO.:  [id number]  LOCATION:  6E30C                        FACILITY:  [facility]  PHYSICIAN:  Chia Rock N. Terrence Dupont, M.D. DATE OF BIRTH:  [birth year]  DATE OF ADMISSION:  08/30/2014 DATE OF DISCHARGE:  09/02/2014                              DISCHARGE SUMMARY   ADMITTING DIAGNOSES: 1. Acute colitis with lower gastrointestinal bleed, rule out ischemia     versus infectious, rule out polyps. 2. History of infectious/ischemic colitis in the past. 3. Hypertension. 4. History of paroxysmal atrial fibrillation in the past, refused for     anticoagulation. 5. Mild obstructive sleep apnea. 6. Gastroesophageal reflux disease. 7. History of cancer of breast. 8. Osteoporosis.  DISCHARGE DIAGNOSES: 1. Resolving ischemic colitis, history of ischemic colitis in the     past. 2. Hypertension. 3. History of paroxysmal atrial fibrillation in the past. 4. Mild obstructive sleep apnea. 5. Gastroesophageal reflux disease. 6. History of cancer of breast. 7. Osteoporosis.  DISCHARGE MEDICATIONS:  Discharge home medications are: 1. Tylenol 500 mg every 6 hours as needed for pain. 2. Atorvastatin 20 mg daily. 3. Zyrtec 10 mg as needed. 4. Losartan 100 mg daily. 5. Metoprolol succinate 25 mg in a.m. and 50 mg in p.m. 6. Omeprazole 40 mg 1 capsule daily. 7. Ditropan XL 1 tablet at bedtime.  DIET:  Low salt, low cholesterol.  ACTIVITY:  Increase activity slowly as tolerated.  FOLLOWUP:  Follow up with me in 1 week.  Follow up with GI in 1 to 2 weeks as scheduled.  CONDITION AT DISCHARGE:  Stable.  The patient has been advised to call 911 if she notices any abdominal pain associated with bloody stools or feels weak, tired, fatigued.  BRIEF HISTORY AND HOSPITAL COURSE:  [name] is 80 year old female with past medical history significant for hypertension, history of paroxysmal AFib in the past, GERD, history of colitis in the  past, history of CA of breast, history of mild obstructive sleep apnea, osteoporosis.  She came to the ER complaining of left lower quadrant abdominal pain associated with nausea, vomiting, diarrhea, and associated with bright red blood per rectum while at work yesterday afternoon.  The patient denies any fever, but complains of chills. Denies any palpitation, lightheadedness, or syncope.  Denies any chest pain.  Denies shortness of breath.  Denies any NSAID abuse.  The patient had similar presentation approximately 2 years ago and was treated for infectious colitis/ischemic colitis.  The patient was on oral anticoagulants for short period of time, but refused to take anticoagulations for paroxysmal AFib, although her CHADS-VASc score is 3.  PHYSICAL EXAMINATION:  GENERAL:  She is alert, awake, oriented x3. VITAL SIGNS:  Blood pressure was 134/70, pulse 96.  She was afebrile. HEENT:  Conjunctivae was pink. NECK:  Supple.  No JVD.  No bruit. LUNGS:  Clear to auscultation without rhonchi or rales. CARDIOVASCULAR:  S1, S2 was normal.  There was soft systolic murmur.  No S3, gallop. ABDOMEN:  Soft.  Bowel sounds were present.  There was mild left lower quadrant tenderness.  There was no rebound or guarding. EXTREMITIES:  There is no clubbing, cyanosis, or edema. NEUROLOGIC:  Grossly intact.  LABORATORY DATA:  Sodium was 138, potassium 4.0, BUN 25, creatinine 1.14.  Hemoglobin was 10.7, hematocrit 32.7, white count of 12.6 with left shift.  Her lactic acid levels were normal at 0.83 and 1.38. Repeat hemoglobin yesterday was 8.9, hematocrit 26.9, white count of 12.0.  Today, hemoglobin is 9.2, hematocrit 27.6, white count of 10.2. Sodium today is 139, potassium 3.6, BUN 6, creatinine 0.97.  Her urine culture is negative.  CT angio showed colitis extending from mid transfers to distal descending colon.  No active extravasation or broke external occlusion.  This may be ischemic/infectious  or inflammatory.  BRIEF HOSPITAL COURSE:  The patient was admitted to regular floor.  GI consultation was obtained with Dr. Cristina Gong.  The patient subsequently underwent colonoscopy yesterday, which showed left-sided colitis, which looks like ischemic in character.  Biopsies were done.  Results of biopsies are pending.  Colitis did not look too severe.  The patient's abdominal pain is completely resolved.  The patient is eager to go home and her diet was advanced, which she is tolerating well.  The patient did not have any further episodes of bloody diarrhea.  The patient is ambulating in the room without any problems.  Her hemoglobin is stable. The patient will be discharged home and will be followed up in my office in 1 week and GI as scheduled in 1 to 2 weeks.  The patient has been advised if she had recurrent episodes of abdominal pain or bleeding, should call 911 and come to ED.     Allegra Lai. Terrence Dupont, M.D.     MNH/MEDQ  D:  09/02/2014  T:  09/02/2014  Job:  [id number]

## 2014-09-07 ENCOUNTER — Ambulatory Visit (INDEPENDENT_AMBULATORY_CARE_PROVIDER_SITE_OTHER): Payer: PPO

## 2014-09-07 ENCOUNTER — Ambulatory Visit (INDEPENDENT_AMBULATORY_CARE_PROVIDER_SITE_OTHER): Payer: PPO | Admitting: Family Medicine

## 2014-09-07 VITALS — BP 130/84 | HR 63 | Temp 97.8°F | Resp 16 | Ht 63.5 in | Wt 175.0 lb

## 2014-09-07 DIAGNOSIS — M79604 Pain in right leg: Secondary | ICD-10-CM

## 2014-09-07 DIAGNOSIS — R21 Rash and other nonspecific skin eruption: Secondary | ICD-10-CM

## 2014-09-07 DIAGNOSIS — I868 Varicose veins of other specified sites: Secondary | ICD-10-CM

## 2014-09-07 DIAGNOSIS — I8390 Asymptomatic varicose veins of unspecified lower extremity: Secondary | ICD-10-CM

## 2014-09-07 MED ORDER — MUPIROCIN 2 % EX OINT: TOPICAL_OINTMENT | CUTANEOUS | Status: DC

## 2014-09-07 NOTE — Progress Notes (Signed)
Chief Complaint:  Chief Complaint  Patient presents with  . Leg Swelling    right leg swollen  . other    was working out yesterday and the machine hit her in the lower leg    HPI: Wanda Green is a 80 y.o. female who is here for  one-week history of right shin pain after hitting it on the moving bars of a elliptical machine. She was in the hospital recently for ischemic colitis. She is not on any blood thinners. This all happened prior to her hospitalization. They did put SCDs on her when she was in the hospital and she felt like it made it worse. She has been able to elevated and it is better in terms of swelling when she wakes up in the morning. However she works all day and is on her feet aureus sedentary and so the swelling goes up. She denies any fevers or chills. There is no redness or warmth. Not put anything on it. She does not have any history of osteoporosis.  She does not she has a history of varicose veins status post vein surgery.  Risk factors of PE/DVT. + breast cancer, + recent hospital stay, trauma ; denies  hx of DVT/PE, long car or plane rides, OCP use  Even though she has risk factors for DVT, the swelling appears to be intermittent and is improved with elevation.   Past Medical History  Diagnosis Date  . Breast cancer   . Hypertension   . Osteoporosis 06/2012    T score -2.5  . Colitis, ischemic   . Elevated cholesterol   . Cervical dysplasia age 76  . Arthritis   . Sleep apnea    Past Surgical History  Procedure Laterality Date  . Bi lateral knee scope    . Breast surgery  2000    RIGHT BREAST LUMPECTOMY  . Tubal ligation    . Colposcopy    . Cervical cone biopsy  age 30  . Nasal sinus surgery  12/14  . Colonoscopy with propofol N/A 09/01/2014    Procedure: COLONOSCOPY WITH PROPOFOL;  Surgeon: Ronald Lobo, MD;  Location: [place];  Service: Endoscopy;  Laterality: N/A;  this will be done unprepped   History   Social History  .  Marital Status: Widowed    Spouse Name: N/A  . Number of Children: N/A  . Years of Education: N/A   Occupational History  . part time [employer] Main Topics  . Smoking status: Former Smoker -- 0.10 packs/day for 3 years    Types: Cigarettes    Quit date: 03/05/1972  . Smokeless tobacco: Never Used     Comment: ONE CIG A DAY  . Alcohol Use: No  . Drug Use: No  . Sexual Activity: No   Other Topics Concern  . None   Social History Narrative   Family History  Problem Relation Age of Onset  . Hypertension Mother   . Heart failure Mother   . Heart disease Mother   . Hypertension Father   . Heart disease Father   . Hypertension Sister   . Uterine cancer Sister   . Hypertension Brother    Allergies  Allergen Reactions  . Augmentin [Amoxicillin-Pot Clavulanate]     Mouth pain  . Hydrocodone Nausea And Vomiting  . Oxycodone-Acetaminophen   . Penicillins     bleeding  . Codeine Nausea And Vomiting  . Doxycycline Nausea And Vomiting  Prior to Admission medications   Medication Sig Start Date End Date Taking? Authorizing Provider  acetaminophen (TYLENOL) 500 MG tablet Take 500 mg by mouth every 6 (six) hours as needed for mild pain or moderate pain.   Yes Historical Provider, MD  atorvastatin (LIPITOR) 20 MG tablet Take 20 mg by mouth every other day.    Yes Historical Provider, MD  losartan (COZAAR) 100 MG tablet Take 100 mg by mouth daily.   Yes Historical Provider, MD  metoprolol succinate (TOPROL-XL) 25 MG 24 hr tablet Take 25-50 mg by mouth daily. 25 am and 50mg  in pm   Yes Historical Provider, MD  metoprolol succinate (TOPROL-XL) 50 MG 24 hr tablet Take 50 mg by mouth daily. Take with or immediately following a meal.   Yes Historical Provider, MD  oxybutynin (DITROPAN-XL) 10 MG 24 hr tablet TAKE 1 TABLET (10 MG TOTAL) BY MOUTH AT BEDTIME. 02/18/14  Yes Timothy P Fontaine, MD  cetirizine (ZYRTEC) 10 MG tablet Take 10 mg by mouth at bedtime.     Historical Provider, MD  omeprazole (PRILOSEC) 40 MG capsule Take 1 capsule (40 mg total) by mouth daily as needed. Patient not taking: Reported on 09/07/2014 07/14/12   Leandrew Koyanagi, MD     ROS: The patient denies fevers, chills, night sweats, unintentional weight loss, chest pain, palpitations, wheezing, dyspnea on exertion, nausea, vomiting, abdominal pain, dysuria, hematuria, melena, numbness, weakness, or tingling.   All other systems have been reviewed and were otherwise negative with the exception of those mentioned in the HPI and as above.    PHYSICAL EXAM: Filed Vitals:   09/07/14 1744  BP: 130/84  Pulse: 63  Temp: 97.8 F (36.6 C)  Resp: 16   Filed Vitals:   09/07/14 1744  Height: 5' 3.5" (1.613 m)  Weight: 175 lb (79.379 kg)   Body mass index is 30.51 kg/(m^2).   General: Alert, no acute distress HEENT:  Normocephalic, atraumatic, oropharynx patent. EOMI, PERRLA Cardiovascular:  Regular rate and rhythm, no rubs , + murmurs  No gallops.  No Carotid bruits, radial pulse intact. No pedal edema.  Respiratory: Clear to auscultation bilaterally.  No wheezes, rales, or rhonchi.  No cyanosis, no use of accessory musculature GI: No organomegaly, abdomen is soft and non-tender, positive bowel sounds.  No masses. Skin: Positive minimal erythematous localized rashes. Neurologic: Facial musculature symmetric. Psychiatric: Patient is appropriate throughout our interaction. Lymphatic: No cervical lymphadenopathy Musculoskeletal: Gait intact.   LABS: Results for orders placed or performed during the hospital encounter of 08/30/14  Urine culture  Result Value Ref Range   Specimen Description URINE, RANDOM    Special Requests NONE    Culture      MULTIPLE SPECIES PRESENT, SUGGEST RECOLLECTION IF CLINICALLY INDICATED   Report Status 09/01/2014 FINAL   Urine culture  Result Value Ref Range   Specimen Description URINE, RANDOM    Special Requests NONE    Culture       MULTIPLE SPECIES PRESENT, SUGGEST RECOLLECTION IF CLINICALLY INDICATED   Report Status 09/02/2014 FINAL   CBC with Differential  Result Value Ref Range   WBC 12.6 (H) 4.0 - 10.5 K/uL   RBC 3.58 (L) 3.87 - 5.11 MIL/uL   Hemoglobin 10.7 (L) 12.0 - 15.0 g/dL   HCT 32.7 (L) 36.0 - 46.0 %   MCV 91.3 78.0 - 100.0 fL   MCH 29.9 26.0 - 34.0 pg   MCHC 32.7 30.0 - 36.0 g/dL   RDW 13.9 11.5 -  15.5 %   Platelets 179 150 - 400 K/uL   Neutrophils Relative % 88 (H) 43 - 77 %   Neutro Abs 11.1 (H) 1.7 - 7.7 K/uL   Lymphocytes Relative 6 (L) 12 - 46 %   Lymphs Abs 0.8 0.7 - 4.0 K/uL   Monocytes Relative 6 3 - 12 %   Monocytes Absolute 0.7 0.1 - 1.0 K/uL   Eosinophils Relative 0 0 - 5 %   Eosinophils Absolute 0.0 0.0 - 0.7 K/uL   Basophils Relative 0 0 - 1 %   Basophils Absolute 0.0 0.0 - 0.1 K/uL  Comprehensive metabolic panel  Result Value Ref Range   Sodium 138 135 - 145 mmol/L   Potassium 4.0 3.5 - 5.1 mmol/L   Chloride 108 101 - 111 mmol/L   CO2 17 (L) 22 - 32 mmol/L   Glucose, Bld 131 (H) 65 - 99 mg/dL   BUN 25 (H) 6 - 20 mg/dL   Creatinine, Ser 1.14 (H) 0.44 - 1.00 mg/dL   Calcium 9.1 8.9 - 10.3 mg/dL   Total Protein 7.3 6.5 - 8.1 g/dL   Albumin 4.3 3.5 - 5.0 g/dL   AST 19 15 - 41 U/L   ALT 14 14 - 54 U/L   Alkaline Phosphatase 52 38 - 126 U/L   Total Bilirubin 0.8 0.3 - 1.2 mg/dL   GFR calc non Af Amer 45 (L) >60 mL/min   GFR calc Af Amer 52 (L) >60 mL/min   Anion gap 13 5 - 15  Lipase, blood  Result Value Ref Range   Lipase 16 (L) 22 - 51 U/L  Urinalysis, Routine w reflex microscopic (not at [facility])  Result Value Ref Range   Color, Urine AMBER (A) YELLOW   APPearance CLEAR CLEAR   Specific Gravity, Urine 1.018 1.005 - 1.030   pH 5.0 5.0 - 8.0   Glucose, UA NEGATIVE NEGATIVE mg/dL   Hgb urine dipstick NEGATIVE NEGATIVE   Bilirubin Urine NEGATIVE NEGATIVE   Ketones, ur 15 (A) NEGATIVE mg/dL   Protein, ur NEGATIVE NEGATIVE mg/dL   Urobilinogen, UA 0.2 0.0 - 1.0 mg/dL    Nitrite NEGATIVE NEGATIVE   Leukocytes, UA TRACE (A) NEGATIVE  Protime-INR  Result Value Ref Range   Prothrombin Time 14.2 11.6 - 15.2 seconds   INR 1.08 0.00 - 1.49  Urine microscopic-add on  Result Value Ref Range   Squamous Epithelial / LPF RARE RARE   WBC, UA 0-2 <3 WBC/hpf   RBC / HPF 0-2 <3 RBC/hpf   Bacteria, UA RARE RARE   Casts HYALINE CASTS (A) NEGATIVE  Amylase  Result Value Ref Range   Amylase 105 (H) 28 - 100 U/L  Comprehensive metabolic panel  Result Value Ref Range   Sodium 138 135 - 145 mmol/L   Potassium 3.6 3.5 - 5.1 mmol/L   Chloride 109 101 - 111 mmol/L   CO2 21 (L) 22 - 32 mmol/L   Glucose, Bld 131 (H) 65 - 99 mg/dL   BUN 13 6 - 20 mg/dL   Creatinine, Ser 1.08 (H) 0.44 - 1.00 mg/dL   Calcium 8.4 (L) 8.9 - 10.3 mg/dL   Total Protein 6.4 (L) 6.5 - 8.1 g/dL   Albumin 3.5 3.5 - 5.0 g/dL   AST 17 15 - 41 U/L   ALT 12 (L) 14 - 54 U/L   Alkaline Phosphatase 39 38 - 126 U/L   Total Bilirubin 0.8 0.3 - 1.2 mg/dL   GFR calc non  Af Amer 48 (L) >60 mL/min   GFR calc Af Amer 56 (L) >60 mL/min   Anion gap 8 5 - 15  CBC WITH DIFFERENTIAL  Result Value Ref Range   WBC 13.4 (H) 4.0 - 10.5 K/uL   RBC 3.30 (L) 3.87 - 5.11 MIL/uL   Hemoglobin 10.1 (L) 12.0 - 15.0 g/dL   HCT 30.2 (L) 36.0 - 46.0 %   MCV 91.5 78.0 - 100.0 fL   MCH 30.6 26.0 - 34.0 pg   MCHC 33.4 30.0 - 36.0 g/dL   RDW 14.3 11.5 - 15.5 %   Platelets 170 150 - 400 K/uL   Neutrophils Relative % 82 (H) 43 - 77 %   Neutro Abs 11.1 (H) 1.7 - 7.7 K/uL   Lymphocytes Relative 6 (L) 12 - 46 %   Lymphs Abs 0.8 0.7 - 4.0 K/uL   Monocytes Relative 12 3 - 12 %   Monocytes Absolute 1.5 (H) 0.1 - 1.0 K/uL   Eosinophils Relative 0 0 - 5 %   Eosinophils Absolute 0.0 0.0 - 0.7 K/uL   Basophils Relative 0 0 - 1 %   Basophils Absolute 0.0 0.0 - 0.1 K/uL  Basic metabolic panel  Result Value Ref Range   Sodium 140 135 - 145 mmol/L   Potassium 3.3 (L) 3.5 - 5.1 mmol/L   Chloride 108 101 - 111 mmol/L   CO2 24 22 -  32 mmol/L   Glucose, Bld 110 (H) 65 - 99 mg/dL   BUN 7 6 - 20 mg/dL   Creatinine, Ser 1.03 (H) 0.44 - 1.00 mg/dL   Calcium 8.4 (L) 8.9 - 10.3 mg/dL   GFR calc non Af Amer 51 (L) >60 mL/min   GFR calc Af Amer 59 (L) >60 mL/min   Anion gap 8 5 - 15  CBC  Result Value Ref Range   WBC 12.0 (H) 4.0 - 10.5 K/uL   RBC 2.97 (L) 3.87 - 5.11 MIL/uL   Hemoglobin 8.9 (L) 12.0 - 15.0 g/dL   HCT 26.9 (L) 36.0 - 46.0 %   MCV 90.6 78.0 - 100.0 fL   MCH 30.0 26.0 - 34.0 pg   MCHC 33.1 30.0 - 36.0 g/dL   RDW 14.2 11.5 - 15.5 %   Platelets 162 150 - 400 K/uL  Basic metabolic panel  Result Value Ref Range   Sodium 139 135 - 145 mmol/L   Potassium 3.6 3.5 - 5.1 mmol/L   Chloride 110 101 - 111 mmol/L   CO2 24 22 - 32 mmol/L   Glucose, Bld 105 (H) 65 - 99 mg/dL   BUN 6 6 - 20 mg/dL   Creatinine, Ser 0.97 0.44 - 1.00 mg/dL   Calcium 8.5 (L) 8.9 - 10.3 mg/dL   GFR calc non Af Amer 55 (L) >60 mL/min   GFR calc Af Amer >60 >60 mL/min   Anion gap 5 5 - 15  CBC  Result Value Ref Range   WBC 10.2 4.0 - 10.5 K/uL   RBC 3.06 (L) 3.87 - 5.11 MIL/uL   Hemoglobin 9.2 (L) 12.0 - 15.0 g/dL   HCT 27.6 (L) 36.0 - 46.0 %   MCV 90.2 78.0 - 100.0 fL   MCH 30.1 26.0 - 34.0 pg   MCHC 33.3 30.0 - 36.0 g/dL   RDW 14.3 11.5 - 15.5 %   Platelets 179 150 - 400 K/uL  Magnesium  Result Value Ref Range   Magnesium 1.7 1.7 - 2.4 mg/dL  I-Stat  CG4 Lactic Acid, ED  Result Value Ref Range   Lactic Acid, Venous 0.83 0.5 - 2.0 mmol/L  I-Stat CG4 Lactic Acid, ED  Result Value Ref Range   Lactic Acid, Venous 1.38 0.5 - 2.0 mmol/L  Type and screen  Result Value Ref Range   ABO/RH(D) O POS    Antibody Screen NEG    Sample Expiration 09/02/2014   ABO/Rh  Result Value Ref Range   ABO/RH(D) O POS      EKG/XRAY:   Primary read interpreted by Dr. Marin Comment at [facility]. Neg for fracture or dislocation, + soft tissue swelling   ASSESSMENT/PLAN: Encounter Diagnoses  Name Primary?  . Right leg pain Yes  . Rash and nonspecific  skin eruption   . Varicose veins    Right shin pain most likely contusion  She probably has symptoms of venous insufficiency and the right anterior shin contusion has difficulty reabsorbing due to her varicose veins. Advise to use compression socks Advised to elevate and use warm compresses Precautions were given to her for DVT/PE. She she has a negative Homans sign. Localized erythematous rash, will rx bactroban Fu prn , if no improvement then consider doppler   Gross sideeffects, risk and benefits, and alternatives of medications d/w patient. Patient is aware that all medications have potential sideeffects and we are unable to predict every sideeffect or drug-drug interaction that may occur.  Abbi Mancini, Roe, DO 09/07/2014 6:33 PM

## 2014-09-15 NOTE — Op Note (Signed)
[facility] [place], [id number]   COLONOSCOPY PROCEDURE REPORT  PATIENT: Wanda Green, Wanda Green  MR#: [id number] BIRTHDATE: 04/03/1934 , 48  yrs. old GENDER: female ENDOSCOPIST: Ronald Lobo, MD REFERRED BY:  Dr. Charolette Forward PROCEDURE DATE:  09-07-2014 PROCEDURE:   colonoscopy (partial) with biopsies ASA CLASS:   II INDICATIONS:  left-sided colitis, abrupt onset, suggestive of ischemic colitis MEDICATIONS: propofol, per Mac anesthesia  DESCRIPTION OF PROCEDURE:   After the risks and benefits and of the procedure were explained, informed consent was obtained. the patient was brought from her hospital room to the [facility] cone endoscopy unit. Time out was performed. This procedure was intentionally done unprepped. Perianal exam was unremarkable. The Pentax Adult Colon [id number]  endoscope was introduced through the anus and advanced to the hepatic flexure      .  The quality of the prep was remarkably good, given the absence of a prep having been administered      .  The instrument was then slowly withdrawn as the colon was fully examined. Estimated blood loss is zero unless otherwise noted in this procedure report.   The scope was advanced without significant difficulty to the region of the hepatic flexure. Pullback was initiated at that time because it was felt that the diagnostic intent of the procedure had been achieved .  there was some formed brown stool just proximal to the hepatic flexure; elsewhere in the colon, no stool was seen.  There was no active bleeding or blood in the lumen at the time this exam, but there was a coating of mucoid blood in the sigmoid region.  There was also some sigmoid diverticulosis.  Beginning near the sigmoid/descending colon junction, and present in a confluent fashion to the mid transverse colon, the patient had inflammatory changes of the colonic mucosa consistent with ischemia, including  significant mucosal edema, loss of vascularity, edema of the folds or muscular rings of the colon, erythema, granularity, and exudate.  There was no evidence of gangrene. No polyps or masses were seen.  Biopsies were obtained from the normal-appearing mucosa near the hepatic flexure, and then from several locations in the inflamed segment.  Retroflexion in the rectum was not performed.          The scope was then withdrawn from the patient and the procedure completed.  WITHDRAWAL TIME: not measured. This was not a screening exam and it was not relevant to obtain a withdrawal time for a partial colonoscopy like this with specific intent of mucosal assessment area  COMPLICATIONS: There were no immediate complications.  ENDOSCOPIC IMPRESSION: 1. No active bleeding or free blood in the colonic lumen at the time this exam. 2. Residual mucoid blood coating the colonic mucosa, as discussed above 3. Confluent colitis involving the left colon (distribution of the inferior mesenteric artery) from the distal transverse colon to the sigmoid/descending junction, strongly suggestive ofischemic colitis of mild to moderate severity (no gangrene), biopsies pending 4. Mild sigmoid diverticulosis   RECOMMENDATIONS: 1. Continue supportive care 2. Okay to discontinue antibiotics 3. Await pathology results 4. Ordinarily, especially if ischemia is confirmed by current biopsies, this condition will run its own course in a benign fashion and follow-up colonoscopy to confirm healing is not necessary  REPEAT EXAM: not necessary  cc:  _______________________________ eSignedRonald Lobo, MD Sep 07, 2014 2:35 PM   CPT CODES: ICD CODES:  The ICD and CPT codes recommended by this software are interpretations from the data that  the clinical staff has captured with the software.  The verification of the translation of this report to the ICD and CPT codes and modifiers is the  sole responsibility of the health care institution and practicing physician where this report was generated.  [employer]. will not be held responsible for the validity of the ICD and CPT codes included on this report.  AMA assumes no liability for data contained or not contained herein. CPT is a Designer, television/film set of the [employer].   PATIENT NAME:  Wanda Green MR#: [id number]

## 2014-09-21 ENCOUNTER — Encounter: Payer: Self-pay | Admitting: Oncology

## 2014-09-21 ENCOUNTER — Ambulatory Visit (INDEPENDENT_AMBULATORY_CARE_PROVIDER_SITE_OTHER): Payer: PPO | Admitting: Oncology

## 2014-09-21 VITALS — BP 132/67 | HR 64 | Temp 98.0°F | Ht 63.5 in | Wt 178.3 lb

## 2014-09-21 DIAGNOSIS — I10 Essential (primary) hypertension: Secondary | ICD-10-CM | POA: Diagnosis not present

## 2014-09-21 DIAGNOSIS — C50911 Malignant neoplasm of unspecified site of right female breast: Secondary | ICD-10-CM

## 2014-09-21 DIAGNOSIS — I4891 Unspecified atrial fibrillation: Secondary | ICD-10-CM

## 2014-09-21 DIAGNOSIS — Z853 Personal history of malignant neoplasm of breast: Secondary | ICD-10-CM

## 2014-09-21 DIAGNOSIS — D649 Anemia, unspecified: Secondary | ICD-10-CM

## 2014-09-21 DIAGNOSIS — K5289 Other specified noninfective gastroenteritis and colitis: Secondary | ICD-10-CM

## 2014-09-21 NOTE — Patient Instructions (Signed)
Return visit 1 year Lab one week before visit Continue annual mammograms at [facility]: next due in March, 2017

## 2014-09-21 NOTE — Progress Notes (Signed)
Patient ID: Wanda Green, female   DOB: [birth year], 80 y.o.   MRN: [id number] Hematology and Oncology Follow Up Visit  Wanda Green [id number] Jun 25, 1934 80 y.o. 09/21/2014 6:36 PM   Principle Diagnosis: Encounter Diagnosis  Name Primary?  . Breast cancer, stage 2, ER positive,  right Yes  Clinical Summary: Pleasant 80 year old woman with a history of stage II, 2-node positive, ER positive, cancer of the right breast initially diagnosed in January 2000, treated with lumpectomy, breast radiation, four cycles of Adriamycin, Cytoxan and Taxotere chemotherapy, five years of tamoxifen, and one year of Arimidex.  Interim History:   She was recently hospitalized in June 2016 when she developed recurrent hematochezia. No active bleeding at time of colonoscopy. Pathology was nonspecific. Clinical appearance was one of ischemic colitis. She had a similar episode requiring hospitalization in February 2014. She was diagnosed with acute atrial fibrillation at that time. She is currently not on any anticoagulation since she has had intolerance to both warfarin and Eloquis. Additional problems since last visit include surgery for a deviated nasal septum. She had a pulmonary medicine evaluation for narcolepsy and was diagnosed with mild sleep apnea syndrome and is now on CPAP. She had a mammogram last week on 09/21/2014 which shows no new areas of concern.   Medications: reviewed  Allergies:  Allergies  Allergen Reactions  . Augmentin [Amoxicillin-Pot Clavulanate]     Mouth pain  . Hydrocodone Nausea And Vomiting  . Oxycodone-Acetaminophen   . Penicillins     bleeding  . Codeine Nausea And Vomiting  . Doxycycline Nausea And Vomiting    Review of Systems: See HPI Remaining ROS negative:   Physical Exam: Blood pressure 132/67, pulse 64, temperature 98 F (36.7 C), temperature source Oral, height 5' 3.5" (1.613 m), weight 178 lb 4.8 oz (80.876 kg), SpO2 100 %. Wt Readings from Last 3  Encounters:  09/21/14 178 lb 4.8 oz (80.876 kg)  09/07/14 175 lb (79.379 kg)  09/01/14 180 lb 8 oz (81.874 kg)     General appearance: Well-nourished Caucasian woman HENNT: Pharynx no erythema, exudate, mass, or ulcer. No thyromegaly or thyroid nodules Lymph nodes: No cervical, supraclavicular, or axillary lymphadenopathy Breasts: Surgical changes in the right breast. No dominant mass in either breast. Lungs: Clear to auscultation, resonant to percussion throughout Heart: Regular rhythm, 2/6 aortic systolic murmur, no gallop, no rub, no click, no edema Abdomen: Soft, nontender, normal bowel sounds, no mass, no organomegaly Extremities: No edema, no calf tenderness Musculoskeletal: no joint deformities GU:  Vascular: Carotid pulses 2+, no bruits, distal pulses: Dorsalis pedis 1+ symmetric Neurologic: Alert, oriented, PERRLA, optic discs sharp and vessels normal, no hemorrhage or exudate, cranial nerves grossly normal, motor strength 5 over 5, reflexes 1+ symmetric, upper body coordination normal, gait normal, Skin: No rash or ecchymosis  Lab Results: CBC W/Diff    Component Value Date/Time   WBC 10.2 09/02/2014 0526   WBC 5.3 06/10/2012 1327   RBC 3.06* 09/02/2014 0526   RBC 3.60* 06/10/2012 1327   HGB 9.2* 09/02/2014 0526   HGB 11.0* 06/10/2012 1327   HCT 27.6* 09/02/2014 0526   HCT 33.8* 06/10/2012 1327   PLT 179 09/02/2014 0526   PLT 217 06/10/2012 1327   MCV 90.2 09/02/2014 0526   MCV 93.9 06/10/2012 1327   MCH 30.1 09/02/2014 0526   MCH 30.6 06/10/2012 1327   MCHC 33.3 09/02/2014 0526   MCHC 32.5 06/10/2012 1327   RDW 14.3 09/02/2014 0526   RDW 13.3 06/10/2012 1327  LYMPHSABS 0.8 08/31/2014 1058   LYMPHSABS 1.6 06/10/2012 1327   MONOABS 1.5* 08/31/2014 1058   MONOABS 0.5 06/10/2012 1327   EOSABS 0.0 08/31/2014 1058   EOSABS 0.1 06/10/2012 1327   BASOSABS 0.0 08/31/2014 1058   BASOSABS 0.0 06/10/2012 1327     Chemistry      Component Value Date/Time   NA 139  09/02/2014 0526   NA 140 06/03/2012 1139   K 3.6 09/02/2014 0526   K 4.7 06/03/2012 1139   CL 110 09/02/2014 0526   CL 105 06/03/2012 1139   CO2 24 09/02/2014 0526   CO2 25 06/03/2012 1139   BUN 6 09/02/2014 0526   BUN 28.0* 06/03/2012 1139   CREATININE 0.97 09/02/2014 0526   CREATININE 1.19* 06/22/2013 1005   CREATININE 1.5* 06/03/2012 1139      Component Value Date/Time   CALCIUM 8.5* 09/02/2014 0526   CALCIUM 9.4 06/03/2012 1139   ALKPHOS 39 08/31/2014 1058   ALKPHOS 70 06/03/2012 1139   AST 17 08/31/2014 1058   AST 15 06/03/2012 1139   ALT 12* 08/31/2014 1058   ALT 14 06/03/2012 1139   BILITOT 0.8 08/31/2014 1058   BILITOT 0.32 06/03/2012 1139        Impression:  #1. Stage II ER positive breast cancer treated as outlined above No evidence for new disease now out over 16 years from diagnosis and is hopefully cured. She will continue annual mammograms and exam.  #2. Mild chronic normochromic anemia Serum immunoglobulins normal when checked in 2015 and no monoclonal proteins on IFE.  #3. History of recurrent colitis, etiology not clear. Not related to diverticulosis. No known coronary artery disease.  #4.  atrial fibrillation controlled on medication. Diagnosed 2014. Not on chronic anticoagulation secondary to her perceived intolerance to these medications with atypical pain and muscle cramps. She continues follow-up with her cardiologist Dr. Terrence Dupont. He admitted her for her recent ischemic colitis.  #5. Essential hypertension   CC: Patient Care Team: Charolette Forward, MD as PCP - General (Cardiology) Annia Belt, MD as Consulting Physician (Oncology)   Annia Belt, MD 7/19/20166:36 PM

## 2014-10-13 ENCOUNTER — Encounter: Payer: Self-pay | Admitting: Pulmonary Disease

## 2014-10-29 ENCOUNTER — Encounter: Payer: Self-pay | Admitting: Internal Medicine

## 2014-10-29 ENCOUNTER — Ambulatory Visit (INDEPENDENT_AMBULATORY_CARE_PROVIDER_SITE_OTHER): Payer: PPO | Admitting: Internal Medicine

## 2014-10-29 VITALS — BP 124/68 | HR 74 | Ht 63.5 in | Wt 174.0 lb

## 2014-10-29 DIAGNOSIS — G4733 Obstructive sleep apnea (adult) (pediatric): Secondary | ICD-10-CM | POA: Diagnosis not present

## 2014-10-29 DIAGNOSIS — K529 Noninfective gastroenteritis and colitis, unspecified: Secondary | ICD-10-CM | POA: Diagnosis not present

## 2014-10-29 DIAGNOSIS — J301 Allergic rhinitis due to pollen: Secondary | ICD-10-CM

## 2014-10-29 NOTE — Progress Notes (Signed)
[facility] Consultation      Assessment and Plan:   Obstructive sleep apnea   -The patient had a home sleep study which showed mild obstructive sleep apnea approximately one year ago. Her CPAP was set up with a home CPAP titration study. She now has continued excessive daytime sleepiness despite using CPAP and recommended settings. We'll therefore need to perform an in lab CPAP titration to best assess her CPAP needs.   Excessive daytime somnolence. -The patient continues to have daytime sleepiness and falls awaken voluntarily despite being at the recommended CPAP level.   Essential hypertension -Affective sleep apnea can contribute to essential hypertension. Therefore, it is important that this be adequately treated.  Atrial fibrillation . -History of atrial fibrillation in the past, currently controlled.  TMJ -The patient has TMJ dysfunction, currently, awaiting to a slightly narrow oral cavity opening. This may be contributing to worsening sleep apnea.   Date: 10/29/2014  MRN# [id number] Wanda Green [birth year]    Wanda Green is a 80 y.o. old female seen in consultation for chief complaint of:  Sleep apnea  HPI:   The patient was seen by pulmonary in October 2014 for evaluation of excessive daytime sleepiness. She was found to have mild obstructive sleep apnea, with an AHI of 7 events per hour,on a home sleep study.  She was recommended to start on CPAP. She then underwent a home titration study.  Subsequently, her download showed that she had a lot of breakthrough apneas, she was recommended to return DME for a mask fit and undergoing auto Pap titration.  She tells me then the pressure was increased, she thought that she was sleeping better after that. She has now noted that she is getting sleepy again particularly the past month. She wears it every night, she finds that she takes it off at some point in the night almost every night and she puts it  back on. She finds that is falling asleep at work, particularly in front of a computer screen. She has trouble staying awake at home when she is not active.   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 No sleep walking. She is widowed.  The toprol dose is currently 75 total per day, she is also on ditropan which has been present for several years. She takes zyrtec which she takes on and off. She notes that sometimes it dries her up too much.      PMHX:   Past Medical History  Diagnosis Date  . Breast cancer   . Hypertension   . Osteoporosis 06/2012    T score -2.5  . Colitis, ischemic   . Elevated cholesterol   . Cervical dysplasia age 77  . Arthritis   . Sleep apnea    Surgical Hx:  Past Surgical History  Procedure Laterality Date  . Bi lateral knee scope    . Breast surgery  2000    RIGHT BREAST LUMPECTOMY  . Tubal ligation    . Colposcopy    . Cervical cone biopsy  age 60  . Nasal sinus surgery  12/14  . Colonoscopy with propofol N/A 09/01/2014    Procedure: COLONOSCOPY WITH PROPOFOL;  Surgeon: Ronald Lobo, MD;  Location: [facility];  Service: Endoscopy;  Laterality: N/A;  this will be done unprepped   Family Hx:  Family History  Problem Relation Age of Onset  . Hypertension Mother   . Heart  failure Mother   . Heart disease Mother   . Hypertension Father   . Heart disease Father   . Hypertension Sister   . Uterine cancer Sister   . Hypertension Brother    Social Hx:   Social History  Substance Use Topics  . Smoking status: Former Smoker -- 0.10 packs/day for 3 years    Types: Cigarettes    Quit date: 03/05/1972  . Smokeless tobacco: Never Used     Comment: ONE CIG A DAY  . Alcohol Use: No   Medication:   Current Outpatient Rx  Name  Route  Sig  Dispense  Refill  . acetaminophen (TYLENOL) 500 MG tablet   Oral   Take 500 mg by mouth every 6 (six) hours as needed for mild pain or moderate pain.         Marland Kitchen  atorvastatin (LIPITOR) 20 MG tablet   Oral   Take 20 mg by mouth every other day.          . cetirizine (ZYRTEC) 10 MG tablet   Oral   Take 10 mg by mouth at bedtime.         Marland Kitchen losartan (COZAAR) 100 MG tablet   Oral   Take 100 mg by mouth daily.         . metoprolol succinate (TOPROL-XL) 25 MG 24 hr tablet   Oral   Take 25-50 mg by mouth daily. 25 am and 50mg  in pm         . metoprolol succinate (TOPROL-XL) 50 MG 24 hr tablet   Oral   Take 50 mg by mouth daily. Take with or immediately following a meal.         . mupirocin ointment (BACTROBAN) 2 %      Apply to affected area BID   30 g   0   . omeprazole (PRILOSEC) 40 MG capsule   Oral   Take 1 capsule (40 mg total) by mouth daily as needed. Patient not taking: Reported on 09/07/2014   90 capsule   3   . oxybutynin (DITROPAN-XL) 10 MG 24 hr tablet      TAKE 1 TABLET (10 MG TOTAL) BY MOUTH AT BEDTIME.   30 tablet   12       Allergies:  Augmentin; Hydrocodone; Oxycodone-acetaminophen; Penicillins; Codeine; and Doxycycline  Review of Systems: Gen:  Denies  fever, sweats, chills HEENT: Denies blurred vision, double vision. bleeds, sore throat [place] 3; narrow oral opening due to TMJ.  Cvc:  No dizziness, chest pain. Resp:   Denies cough or sputum porduction, shortness of breath Gi: Denies swallowing difficulty, stomach pain. Gu:  Denies bladder incontinence, burning urine Ext:   No Joint pain, stiffness. Skin: No skin rash,  hives Endoc:  No polyuria, polydipsia. Psych: No depression, insomnia. Other:  All other systems were reviewed with the patient and were negative other that what is mentioned in the HPI.   Physical Examination:   VS: BP 124/68 mmHg  Pulse 74  Ht 5' 3.5" (1.613 m)  Wt 78.926 kg (174 lb)  BMI 30.34 kg/m2  SpO2 96%  General Appearance: No distress  Neuro:without focal findings,  speech normal,  HEENT: PERRLA, EOM intact.   Pulmonary: normal breath sounds, No wheezing.    CardiovascularNormal S1,S2.  No m/r/g.   Abdomen: Benign, Soft, non-tender. Renal:  No costovertebral tenderness  GU:  No performed at this time. Endoc: No evident thyromegaly, no signs of acromegaly. Skin:   warm,  no rashes, no ecchymosis  Extremities: normal, no cyanosis, clubbing.      Thank  you for the consultation and for allowing [facility] to assist in the care of your patient. Our recommendations are noted above.  Please contact us if we can be of further service.   Marda Stalker, MD.  Board Certified in Internal Medicine, Pulmonary Medicine, [place], and Sleep Medicine.  [facility] Office Number: [phone]  Patricia Pesa, M.D.  Vilinda Boehringer, M.D.  Cheral Marker, M.D

## 2014-10-29 NOTE — Patient Instructions (Signed)
--  Will refer for a CPAP titration study, follow up after the test.

## 2014-11-19 ENCOUNTER — Ambulatory Visit: Payer: Medicare HMO | Admitting: Pulmonary Disease

## 2014-11-24 ENCOUNTER — Emergency Department (HOSPITAL_COMMUNITY): Payer: PPO

## 2014-11-24 ENCOUNTER — Other Ambulatory Visit: Payer: Self-pay

## 2014-11-24 ENCOUNTER — Encounter (HOSPITAL_COMMUNITY): Payer: Self-pay | Admitting: Emergency Medicine

## 2014-11-24 ENCOUNTER — Inpatient Hospital Stay (HOSPITAL_COMMUNITY)
Admission: EM | Admit: 2014-11-24 | Discharge: 2014-11-26 | DRG: 392 | Disposition: A | Discharge status: Home / Self Care | Payer: PPO | Attending: Cardiovascular Disease | Admitting: Cardiovascular Disease

## 2014-11-24 DIAGNOSIS — I252 Old myocardial infarction: Secondary | ICD-10-CM | POA: Diagnosis present

## 2014-11-24 DIAGNOSIS — Z885 Allergy status to narcotic agent status: Secondary | ICD-10-CM | POA: Diagnosis not present

## 2014-11-24 DIAGNOSIS — G4730 Sleep apnea, unspecified: Secondary | ICD-10-CM | POA: Diagnosis present

## 2014-11-24 DIAGNOSIS — K219 Gastro-esophageal reflux disease without esophagitis: Principal | ICD-10-CM | POA: Diagnosis present

## 2014-11-24 DIAGNOSIS — R0789 Other chest pain: Secondary | ICD-10-CM | POA: Diagnosis present

## 2014-11-24 DIAGNOSIS — Z8249 Family history of ischemic heart disease and other diseases of the circulatory system: Secondary | ICD-10-CM

## 2014-11-24 DIAGNOSIS — M810 Age-related osteoporosis without current pathological fracture: Secondary | ICD-10-CM | POA: Diagnosis present

## 2014-11-24 DIAGNOSIS — R079 Chest pain, unspecified: Secondary | ICD-10-CM

## 2014-11-24 DIAGNOSIS — E119 Type 2 diabetes mellitus without complications: Secondary | ICD-10-CM | POA: Diagnosis present

## 2014-11-24 DIAGNOSIS — K559 Vascular disorder of intestine, unspecified: Secondary | ICD-10-CM | POA: Diagnosis present

## 2014-11-24 DIAGNOSIS — I249 Acute ischemic heart disease, unspecified: Secondary | ICD-10-CM | POA: Diagnosis present

## 2014-11-24 DIAGNOSIS — Z853 Personal history of malignant neoplasm of breast: Secondary | ICD-10-CM | POA: Diagnosis not present

## 2014-11-24 DIAGNOSIS — I10 Essential (primary) hypertension: Secondary | ICD-10-CM | POA: Diagnosis present

## 2014-11-24 DIAGNOSIS — Z881 Allergy status to other antibiotic agents status: Secondary | ICD-10-CM | POA: Diagnosis not present

## 2014-11-24 DIAGNOSIS — I959 Hypotension, unspecified: Secondary | ICD-10-CM | POA: Diagnosis present

## 2014-11-24 DIAGNOSIS — D500 Iron deficiency anemia secondary to blood loss (chronic): Secondary | ICD-10-CM | POA: Diagnosis present

## 2014-11-24 DIAGNOSIS — Z87410 Personal history of cervical dysplasia: Secondary | ICD-10-CM | POA: Diagnosis not present

## 2014-11-24 DIAGNOSIS — E780 Pure hypercholesterolemia: Secondary | ICD-10-CM | POA: Diagnosis present

## 2014-11-24 DIAGNOSIS — Z87898 Personal history of other specified conditions: Secondary | ICD-10-CM

## 2014-11-24 DIAGNOSIS — Z87891 Personal history of nicotine dependence: Secondary | ICD-10-CM | POA: Diagnosis not present

## 2014-11-24 DIAGNOSIS — Z880 Allergy status to penicillin: Secondary | ICD-10-CM | POA: Diagnosis not present

## 2014-11-24 DIAGNOSIS — E785 Hyperlipidemia, unspecified: Secondary | ICD-10-CM | POA: Diagnosis present

## 2014-11-24 DIAGNOSIS — M1990 Unspecified osteoarthritis, unspecified site: Secondary | ICD-10-CM | POA: Diagnosis present

## 2014-11-24 HISTORY — DX: Personal history of other specified conditions: Z87.898

## 2014-11-24 LAB — CBC
HCT: 30.9 % — ABNORMAL LOW (ref 36.0–46.0)
Hemoglobin: 10 g/dL — ABNORMAL LOW (ref 12.0–15.0)
MCH: 29.8 pg (ref 26.0–34.0)
MCHC: 32.4 g/dL (ref 30.0–36.0)
MCV: 92 fL (ref 78.0–100.0)
PLATELETS: 214 10*3/uL (ref 150–400)
RBC: 3.36 MIL/uL — ABNORMAL LOW (ref 3.87–5.11)
RDW: 13.2 % (ref 11.5–15.5)
WBC: 5.6 10*3/uL (ref 4.0–10.5)

## 2014-11-24 LAB — BASIC METABOLIC PANEL
Anion gap: 7 (ref 5–15)
BUN: 20 mg/dL (ref 6–20)
CO2: 25 mmol/L (ref 22–32)
CREATININE: 1.25 mg/dL — AB (ref 0.44–1.00)
Calcium: 9.2 mg/dL (ref 8.9–10.3)
Chloride: 106 mmol/L (ref 101–111)
GFR calc Af Amer: 47 mL/min — ABNORMAL LOW (ref >60)
GFR calc non Af Amer: 40 mL/min — ABNORMAL LOW (ref >60)
GLUCOSE: 109 mg/dL — AB (ref 65–99)
Potassium: 3.9 mmol/L (ref 3.5–5.1)
Sodium: 138 mmol/L (ref 135–145)

## 2014-11-24 LAB — I-STAT TROPONIN, ED: TROPONIN I, POC: 0 ng/mL (ref 0.00–0.08)

## 2014-11-24 LAB — TROPONIN I: Troponin I: 0.6 ng/mL (ref <0.031)

## 2014-11-24 MED ORDER — SODIUM CHLORIDE 0.9 % IJ SOLN: 3.0000 mL | INTRAMUSCULAR | Status: DC | PRN

## 2014-11-24 MED ORDER — ATORVASTATIN CALCIUM 20 MG PO TABS
20.0000 mg | ORAL_TABLET | ORAL | Status: DC
  Administered 2014-11-26: 20 mg via ORAL
  Filled 2014-11-24: qty 1

## 2014-11-24 MED ORDER — SODIUM CHLORIDE 0.9 % IV SOLN
INTRAVENOUS | Status: DC
  Administered 2014-11-24: 23:00:00 via INTRAVENOUS

## 2014-11-24 MED ORDER — SODIUM CHLORIDE 0.9 % IV SOLN
1.0000 mg/h | INTRAVENOUS | Status: DC
  Filled 2014-11-24: qty 10

## 2014-11-24 MED ORDER — ONDANSETRON HCL 4 MG/2ML IJ SOLN: 4.0000 mg | Freq: Four times a day (QID) | INTRAMUSCULAR | Status: DC | PRN

## 2014-11-24 MED ORDER — LOSARTAN POTASSIUM 50 MG PO TABS
100.0000 mg | ORAL_TABLET | Freq: Every day | ORAL | Status: DC
  Administered 2014-11-26: 100 mg via ORAL
  Filled 2014-11-24: qty 2

## 2014-11-24 MED ORDER — ONDANSETRON HCL 4 MG/2ML IJ SOLN
4.0000 mg | Freq: Once | INTRAMUSCULAR | Status: AC
  Administered 2014-11-24: 4 mg via INTRAVENOUS
  Filled 2014-11-24: qty 2

## 2014-11-24 MED ORDER — SODIUM CHLORIDE 0.9 % IV SOLN
20.0000 ug/h | INTRAVENOUS | Status: DC
  Filled 2014-11-24: qty 50

## 2014-11-24 MED ORDER — MORPHINE SULFATE (PF) 2 MG/ML IV SOLN
2.0000 mg | Freq: Once | INTRAVENOUS | Status: AC
Start: 2014-11-24 — End: 2014-11-24
  Administered 2014-11-24: 2 mg via INTRAVENOUS
  Filled 2014-11-24: qty 1

## 2014-11-24 MED ORDER — HEPARIN (PORCINE) IN NACL 100-0.45 UNIT/ML-% IJ SOLN
950.0000 [IU]/h | INTRAMUSCULAR | Status: DC
  Filled 2014-11-24: qty 250

## 2014-11-24 MED ORDER — SODIUM CHLORIDE 0.9 % IV SOLN: 250.0000 mL | INTRAVENOUS | Status: DC | PRN

## 2014-11-24 MED ORDER — METOPROLOL SUCCINATE ER 25 MG PO TB24: 50.0000 mg | ORAL_TABLET | Freq: Every day | ORAL | Status: DC

## 2014-11-24 MED ORDER — ASPIRIN EC 81 MG PO TBEC
81.0000 mg | DELAYED_RELEASE_TABLET | Freq: Every day | ORAL | Status: DC
  Administered 2014-11-25 – 2014-11-26 (×2): 81 mg via ORAL
  Filled 2014-11-24 (×2): qty 1

## 2014-11-24 MED ORDER — ASPIRIN 81 MG PO CHEW
324.0000 mg | CHEWABLE_TABLET | ORAL | Status: DC
  Filled 2014-11-24: qty 4

## 2014-11-24 MED ORDER — NITROGLYCERIN 0.4 MG SL SUBL: 0.4000 mg | SUBLINGUAL_TABLET | SUBLINGUAL | Status: DC | PRN

## 2014-11-24 MED ORDER — LORATADINE 10 MG PO TABS
10.0000 mg | ORAL_TABLET | Freq: Every day | ORAL | Status: DC
  Administered 2014-11-26: 10 mg via ORAL
  Filled 2014-11-24: qty 1

## 2014-11-24 MED ORDER — ASPIRIN 300 MG RE SUPP: 300.0000 mg | RECTAL | Status: DC

## 2014-11-24 MED ORDER — PANTOPRAZOLE SODIUM 40 MG PO TBEC
40.0000 mg | DELAYED_RELEASE_TABLET | Freq: Every day | ORAL | Status: DC
  Administered 2014-11-26: 40 mg via ORAL
  Filled 2014-11-24: qty 1

## 2014-11-24 MED ORDER — OXYBUTYNIN CHLORIDE ER 5 MG PO TB24
5.0000 mg | ORAL_TABLET | Freq: Every day | ORAL | Status: DC
  Administered 2014-11-24 – 2014-11-25 (×2): 5 mg via ORAL
  Filled 2014-11-24 (×3): qty 1

## 2014-11-24 MED ORDER — HEPARIN BOLUS VIA INFUSION
4000.0000 [IU] | Freq: Once | INTRAVENOUS | Status: DC
  Filled 2014-11-24: qty 4000

## 2014-11-24 MED ORDER — SODIUM CHLORIDE 0.9 % IJ SOLN
3.0000 mL | Freq: Two times a day (BID) | INTRAMUSCULAR | Status: DC
  Administered 2014-11-24 – 2014-11-25 (×2): 3 mL via INTRAVENOUS

## 2014-11-24 NOTE — ED Notes (Signed)
MD at bedside. 

## 2014-11-24 NOTE — Progress Notes (Addendum)
ANTICOAGULATION CONSULT NOTE - Initial Consult  Pharmacy Consult for Heparin Indication: ACS / STEMI  Allergies  Allergen Reactions  . Augmentin [Amoxicillin-Pot Clavulanate]     Mouth pain  . Hydrocodone Nausea And Vomiting  . Oxycodone-Acetaminophen   . Penicillins     bleeding  . Codeine Nausea And Vomiting  . Doxycycline Nausea And Vomiting    Patient Measurements: TBW 79 kg as of August 2016  Vital Signs: Temp: 98.5 F (36.9 C) (09/21 1413) Temp Source: Oral (09/21 1413) BP: 125/61 mmHg (09/21 1515) Pulse Rate: 48 (09/21 1506)  Labs:  Recent Labs  11/24/14 1435  HGB 10.0*  HCT 30.9*  PLT 214  CREATININE 1.25*    CrCl cannot be calculated (Unknown ideal weight.).   Medical History: Past Medical History  Diagnosis Date  . Breast cancer   . Hypertension   . Osteoporosis 06/2012    T score -2.5  . Colitis, ischemic   . Elevated cholesterol   . Cervical dysplasia age 16  . Arthritis   . Sleep apnea     Assessment: 80 yo F presents on 9/21 with chest pain. Pharmacy consulted to dose heparin for ACS. Hgb low at 10.0, plts wnl. No s/s of bleed. No anticoagulants PTA.  Goal of Therapy:  Heparin level 0.3-0.7 units/ml Monitor platelets by anticoagulation protocol: Yes   Plan:  Give 4,000 unit heparin BOLUS Start heparin gtt at 950 units/hr Check 8 hr HL Monitor daily HL, CBC, s/s of bleed   BATCHELDER,NATHAN J 11/24/2014,3:55 PM  ADDENDUM:  Patient refusing heparin gtt due to past GI bleed. Dr. Doylene Canard aware and plans on holding IV heparin until cath tomorrow morning

## 2014-11-24 NOTE — H&P (Signed)
Referring Physician:  STAVROULA [name] is an 80 y.o. female.                       Chief Complaint: Chest pain  HPI: 80 year old female with substernal crushing type chest pain radiating to left jaw and arm, lastig for 2 hours.  Associated symptoms: no dizziness, no fever, no headache, no nausea/vomiting, no palpitations and no shortness of breath.  Risk factors: diabetes mellitus, high cholesterol and hypertension. She was hypotensive post NTG use requiring fluid resuscitation.   Past Medical History  Diagnosis Date  . Breast cancer   . Hypertension   . Osteoporosis 06/2012    T score -2.5  . Colitis, ischemic   . Elevated cholesterol   . Cervical dysplasia age 86  . Arthritis   . Sleep apnea       Past Surgical History  Procedure Laterality Date  . Bi lateral knee scope    . Breast surgery  2000    RIGHT BREAST LUMPECTOMY  . Tubal ligation    . Colposcopy    . Cervical cone biopsy  age 75  . Nasal sinus surgery  12/14  . Colonoscopy with propofol N/A 09/01/2014    Procedure: COLONOSCOPY WITH PROPOFOL;  Surgeon: Ronald Lobo, MD;  Location: [facility];  Service: Endoscopy;  Laterality: N/A;  this will be done unprepped    Family History  Problem Relation Age of Onset  . Hypertension Mother   . Heart failure Mother   . Heart disease Mother   . Hypertension Father   . Heart disease Father   . Hypertension Sister   . Uterine cancer Sister   . Hypertension Brother    Social History:  reports that she quit smoking about 42 years ago. Her smoking use included Cigarettes. She has a .3 pack-year smoking history. She has never used smokeless tobacco. She reports that she does not drink alcohol or use illicit drugs.  Allergies:  Allergies  Allergen Reactions  . Augmentin [Amoxicillin-Pot Clavulanate]     Mouth pain  . Hydrocodone Nausea And Vomiting  . Oxycodone-Acetaminophen   . Penicillins     bleeding  . Codeine Nausea And Vomiting  . Doxycycline Nausea And  Vomiting     (Not in a hospital admission)  Results for orders placed or performed during the hospital encounter of 11/24/14 (from the past 48 hour(s))  Basic metabolic panel     Status: Abnormal   Collection Time: 11/24/14  2:35 PM  Result Value Ref Range   Sodium 138 135 - 145 mmol/L   Potassium 3.9 3.5 - 5.1 mmol/L   Chloride 106 101 - 111 mmol/L   CO2 25 22 - 32 mmol/L   Glucose, Bld 109 (H) 65 - 99 mg/dL   BUN 20 6 - 20 mg/dL   Creatinine, Ser 1.25 (H) 0.44 - 1.00 mg/dL   Calcium 9.2 8.9 - 10.3 mg/dL   GFR calc non Af Amer 40 (L) >60 mL/min   GFR calc Af Amer 47 (L) >60 mL/min    Comment: (NOTE) The eGFR has been calculated using the CKD EPI equation. This calculation has not been validated in all clinical situations. eGFR's persistently <60 mL/min signify possible Chronic Kidney Disease.    Anion gap 7 5 - 15  CBC     Status: Abnormal   Collection Time: 11/24/14  2:35 PM  Result Value Ref Range   WBC 5.6 4.0 - 10.5 K/uL  RBC 3.36 (L) 3.87 - 5.11 MIL/uL   Hemoglobin 10.0 (L) 12.0 - 15.0 g/dL   HCT 30.9 (L) 36.0 - 46.0 %   MCV 92.0 78.0 - 100.0 fL   MCH 29.8 26.0 - 34.0 pg   MCHC 32.4 30.0 - 36.0 g/dL   RDW 13.2 11.5 - 15.5 %   Platelets 214 150 - 400 K/uL  I-stat troponin, ED     Status: None   Collection Time: 11/24/14  2:52 PM  Result Value Ref Range   Troponin i, poc 0.00 0.00 - 0.08 ng/mL   Comment 3            Comment: Due to the release kinetics of cTnI, a negative result within the first hours of the onset of symptoms does not rule out myocardial infarction with certainty. If myocardial infarction is still suspected, repeat the test at appropriate intervals.    Dg Chest 2 View  11/24/2014   CLINICAL DATA:  Chest pain and shortness of breath with chest pain centrally radiating to back.  EXAM: CHEST  2 VIEW  COMPARISON:  04/27/2006  FINDINGS: Lungs are adequately inflated without focal consolidation or effusion. Cardiomediastinal silhouette is within  normal. There is mild calcified plaque over the thoracic aorta. Multiple surgical clips over the right chest. Remainder of the exam is unchanged.  IMPRESSION: No active cardiopulmonary disease.   Electronically Signed   By: Marin Olp M.D.   On: 11/24/2014 15:05    Review Of Systems Review of Systems  Constitutional: Negative for fever and chills.  HENT: Negative for congestion and rhinorrhea.  Eyes: Negative for redness and visual disturbance.  Respiratory: Negative for shortness of breath and wheezing.  Cardiovascular: Positive for chest pain. Negative for palpitations.  Gastrointestinal: Negative for nausea and vomiting.  Genitourinary: Negative for dysuria and urgency.  Musculoskeletal: Negative for myalgias and arthralgias.  Skin: Negative for pallor and wound.  Neurological: Negative for dizziness and headaches.  Blood pressure 125/61, pulse 48, temperature 98.5 F (36.9 C), temperature source Oral, resp. rate 22, SpO2 100 %.  Physical Exam  Constitutional: Well-developed and well-nourished. No distress.  HENT: Normocephalic and atraumatic. Oropharynx is clear and moist.  Eyes: No scleral icterus. Conj-pink.  Neck: Normal range of motion. Neck supple. No JVD present. No tracheal deviation present. No thyromegaly present.  Cardiovascular: Normal rate, regular rhythm, normal heart sounds and intact distal pulses. Exam reveals no gallop and no friction rub. No murmur heard. Respiratory: Effort normal and breath sounds normal.  GI: Soft. Bowel sounds are normal, no distension or tenderness.  Musculoskeletal: Normal range of motion. She exhibits no edema.  Lymphadenopathy: No cervical adenopathy.  Neurological: She is alert. No cranial nerve deficit.  Skin: Skin is warm and dry. No rash noted. No erythema.  Psychiatric: She has a normal mood and affect.    Assessment/Plan Chest pain R/O MI Hypertension Dyslipidemia Arthritis H/O GI bleed  Admit/Troponin-I/Home  medications. No IV heparin per patient due to GI bleed. Cardiac cath in AM.   Birdie Riddle, MD  11/24/2014, 3:56 PM

## 2014-11-24 NOTE — ED Notes (Signed)
Attempted Report 

## 2014-11-24 NOTE — ED Notes (Signed)
Per [facility], pt was at work sitting at desk, sudden onset of substernal CP, sharp pressure. 8/10. Pt also had nausea and vomited once. Pt given 1 nitro 324 asa, dropped pressure to in the 80s so no more nitro given. Pain unsubsided with nitro, still 8/10. AAOX4, in NAD.

## 2014-11-24 NOTE — Progress Notes (Addendum)
CRITICAL VALUE ALERT  Critical value received:  Troponin 0.60  Date of notification:  11/24/2014  Time of notification:  2338  Critical value read back:Yes.    Nurse who received alert:  Alexia Freestone  MD notified (1st page):  Turner   Time of first page:  2340  MD notified (2nd page): Doylene Canard  Time of second page:2355  Responding MD:  Doylene Canard  Time MD responded:  Vertis Kelch, RN

## 2014-11-24 NOTE — ED Notes (Signed)
Pt eating sandwich and applesauce; denies chest pain at this time

## 2014-11-24 NOTE — ED Notes (Signed)
Called for patient at triage x3. No answer 

## 2014-11-24 NOTE — ED Provider Notes (Signed)
CSN: [id number]     Arrival date & time 11/24/14  1405 History   First MD Initiated Contact with Patient 11/24/14 1406     Chief Complaint  Patient presents with  . Chest Pain     (Consider location/radiation/quality/duration/timing/severity/associated sxs/prior Treatment) Patient is a 80 y.o. female presenting with chest pain. The history is provided by the patient.  Chest Pain Pain location:  Substernal area Pain quality: crushing   Pain radiates to:  L jaw and L arm Pain radiates to the back: yes   Pain severity:  Severe Onset quality:  Sudden Duration:  2 hours Timing:  Constant Progression:  Unchanged Chronicity:  New Relieved by:  Nothing Worsened by:  Nothing tried Ineffective treatments:  None tried Associated symptoms: no dizziness, no fever, no headache, no nausea, no palpitations, no shortness of breath and not vomiting   Risk factors: diabetes mellitus, high cholesterol and hypertension   Risk factors: no coronary artery disease and no smoking    81 yo F with a chief complaint chest pain. Sudden pressure radiating to the left arm sudden nausea and vomiting as well. Patient continuing to have symptoms. Was given one nitroglycerin and had profound hypotension. Near syncopal event. Denies any infectious symptoms. History of hypertension hyperlipidemia diabetes. Family history but unsure when mom or dad have their initial MI. No noted PE risk factors.  Past Medical History  Diagnosis Date  . Breast cancer   . Hypertension   . Osteoporosis 06/2012    T score -2.5  . Colitis, ischemic   . Elevated cholesterol   . Cervical dysplasia age 42  . Arthritis   . Sleep apnea    Past Surgical History  Procedure Laterality Date  . Bi lateral knee scope    . Breast surgery  2000    RIGHT BREAST LUMPECTOMY  . Tubal ligation    . Colposcopy    . Cervical cone biopsy  age 25  . Nasal sinus surgery  12/14  . Colonoscopy with propofol N/A 09/01/2014    Procedure: COLONOSCOPY  WITH PROPOFOL;  Surgeon: Ronald Lobo, MD;  Location: [facility];  Service: Endoscopy;  Laterality: N/A;  this will be done unprepped   Family History  Problem Relation Age of Onset  . Hypertension Mother   . Heart failure Mother   . Heart disease Mother   . Hypertension Father   . Heart disease Father   . Hypertension Sister   . Uterine cancer Sister   . Hypertension Brother    Social History  Substance Use Topics  . Smoking status: Former Smoker -- 0.10 packs/day for 3 years    Types: Cigarettes    Quit date: 03/05/1972  . Smokeless tobacco: Never Used     Comment: ONE CIG A DAY  . Alcohol Use: No   OB History    Gravida Para Term Preterm AB TAB SAB Ectopic Multiple Living   3 3 3       3      Review of Systems  Constitutional: Negative for fever and chills.  HENT: Negative for congestion and rhinorrhea.   Eyes: Negative for redness and visual disturbance.  Respiratory: Negative for shortness of breath and wheezing.   Cardiovascular: Positive for chest pain. Negative for palpitations.  Gastrointestinal: Negative for nausea and vomiting.  Genitourinary: Negative for dysuria and urgency.  Musculoskeletal: Negative for myalgias and arthralgias.  Skin: Negative for pallor and wound.  Neurological: Negative for dizziness and headaches.  Allergies  Augmentin; Hydrocodone; Oxycodone-acetaminophen; Penicillins; Codeine; and Doxycycline  Home Medications   Prior to Admission medications   Medication Sig Start Date End Date Taking? Authorizing Provider  acetaminophen (TYLENOL) 500 MG tablet Take 500 mg by mouth every 6 (six) hours as needed for mild pain or moderate pain.    Historical Provider, MD  atorvastatin (LIPITOR) 20 MG tablet Take 20 mg by mouth every other day.     Historical Provider, MD  cetirizine (ZYRTEC) 10 MG tablet Take 10 mg by mouth at bedtime.    Historical Provider, MD  losartan (COZAAR) 100 MG tablet Take 100 mg by mouth daily.    Historical  Provider, MD  metoprolol succinate (TOPROL-XL) 25 MG 24 hr tablet Take 25-50 mg by mouth daily. 25 am and 50mg  in pm    Historical Provider, MD  metoprolol succinate (TOPROL-XL) 50 MG 24 hr tablet Take 50 mg by mouth daily. Take with or immediately following a meal.    Historical Provider, MD  omeprazole (PRILOSEC) 40 MG capsule Take 1 capsule (40 mg total) by mouth daily as needed. 07/14/12   Leandrew Koyanagi, MD  oxybutynin (DITROPAN-XL) 10 MG 24 hr tablet TAKE 1 TABLET (10 MG TOTAL) BY MOUTH AT BEDTIME. 02/18/14   Timothy P Fontaine, MD   BP 125/61 mmHg  Pulse 48  Temp(Src) 98.5 F (36.9 C) (Oral)  Resp 22  SpO2 100% Physical Exam  Constitutional: She is oriented to person, place, and time. She appears well-developed and well-nourished. No distress.  HENT:  Head: Normocephalic and atraumatic.  Eyes: EOM are normal. Pupils are equal, round, and reactive to light.  Neck: Normal range of motion. Neck supple.  Cardiovascular: Normal rate and regular rhythm.  Exam reveals no gallop and no friction rub.   No murmur heard. Pulmonary/Chest: Effort normal. She has no wheezes. She has no rales.  Abdominal: Soft. She exhibits no distension. There is no tenderness. There is no rebound and no guarding.  Musculoskeletal: She exhibits no edema or tenderness.  Neurological: She is alert and oriented to person, place, and time.  Skin: Skin is warm and dry. She is not diaphoretic.  Psychiatric: She has a normal mood and affect. Her behavior is normal.  Nursing note and vitals reviewed.   ED Course  Procedures (including critical care time) Labs Review Labs Reviewed  BASIC METABOLIC PANEL - Abnormal; Notable for the following:    Glucose, Bld 109 (*)    Creatinine, Ser 1.25 (*)    GFR calc non Af Amer 40 (*)    GFR calc Af Amer 47 (*)    All other components within normal limits  CBC - Abnormal; Notable for the following:    RBC 3.36 (*)    Hemoglobin 10.0 (*)    HCT 30.9 (*)    All other  components within normal limits  I-STAT TROPOININ, ED    Imaging Review Dg Chest 2 View  11/24/2014   CLINICAL DATA:  Chest pain and shortness of breath with chest pain centrally radiating to back.  EXAM: CHEST  2 VIEW  COMPARISON:  04/27/2006  FINDINGS: Lungs are adequately inflated without focal consolidation or effusion. Cardiomediastinal silhouette is within normal. There is mild calcified plaque over the thoracic aorta. Multiple surgical clips over the right chest. Remainder of the exam is unchanged.  IMPRESSION: No active cardiopulmonary disease.   Electronically Signed   By: Marin Olp M.D.   On: 11/24/2014 15:05   I have personally reviewed  and evaluated these images and lab results as part of my medical decision-making.   EKG Interpretation   Date/Time:  Wednesday November 24 2014 15:07:06 EDT Ventricular Rate:  53 PR Interval:  141 QRS Duration: 77 QT Interval:  484 QTC Calculation: 454 R Axis:   62 Text Interpretation:  Sinus rhythm Abnormal R-wave progression, early  transition No significant change since last tracing Confirmed by FLOYD MD,  Quillian Quince ([id number]) on 11/24/2014 3:16:46 PM      MDM   Final diagnoses:  Chest pain with high risk for cardiac etiology    80 yo F with a chief complaint of chest pain. Story concerning for possible MI. Given nitroglycerin with noted hypertension. EKG with some concern for possible inferior lateral ischemia. Repeat EKG with mild less than 1 mm elevation in the inferior lateral leads.  No noted concordant changes Discussed case with cardiology. Initial troponin negative Will admit.  The patients results and plan were reviewed and discussed.   Any x-rays performed were independently reviewed by myself.   Differential diagnosis were considered with the presenting HPI.  Medications  morphine 2 MG/ML injection 2 mg (2 mg Intravenous Given 11/24/14 1507)  ondansetron (ZOFRAN) injection 4 mg (4 mg Intravenous Given 11/24/14 1508)     Filed Vitals:   11/24/14 1413 11/24/14 1415 11/24/14 1506 11/24/14 1515  BP: 103/54 109/48 116/47 125/61  Pulse: 50  48   Temp: 98.5 F (36.9 C)     TempSrc: Oral     Resp: 16  12 22   SpO2: 100%  100%     Final diagnoses:  Chest pain with high risk for cardiac etiology    Admission/ observation were discussed with the admitting physician, patient and/or family and they are comfortable with the plan.      Deno Etienne, DO 11/24/14 1552

## 2014-11-25 ENCOUNTER — Encounter (HOSPITAL_COMMUNITY)
Admission: EM | Disposition: A | Discharge status: Home / Self Care | Payer: Self-pay | Source: Home / Self Care | Attending: Cardiovascular Disease

## 2014-11-25 ENCOUNTER — Encounter (HOSPITAL_COMMUNITY): Payer: Self-pay | Admitting: Cardiovascular Disease

## 2014-11-25 ENCOUNTER — Encounter (HOSPITAL_BASED_OUTPATIENT_CLINIC_OR_DEPARTMENT_OTHER): Payer: PPO

## 2014-11-25 HISTORY — PX: CARDIAC CATHETERIZATION: SHX172

## 2014-11-25 LAB — BASIC METABOLIC PANEL
Anion gap: 7 (ref 5–15)
BUN: 14 mg/dL (ref 6–20)
CALCIUM: 9.1 mg/dL (ref 8.9–10.3)
CO2: 23 mmol/L (ref 22–32)
CREATININE: 1.06 mg/dL — AB (ref 0.44–1.00)
Chloride: 108 mmol/L (ref 101–111)
GFR calc non Af Amer: 49 mL/min — ABNORMAL LOW (ref >60)
GFR, EST AFRICAN AMERICAN: 57 mL/min — AB (ref >60)
GLUCOSE: 108 mg/dL — AB (ref 65–99)
Potassium: 3.9 mmol/L (ref 3.5–5.1)
Sodium: 138 mmol/L (ref 135–145)

## 2014-11-25 LAB — CBC
HCT: 30.2 % — ABNORMAL LOW (ref 36.0–46.0)
Hemoglobin: 9.8 g/dL — ABNORMAL LOW (ref 12.0–15.0)
MCH: 29.6 pg (ref 26.0–34.0)
MCHC: 32.5 g/dL (ref 30.0–36.0)
MCV: 91.2 fL (ref 78.0–100.0)
PLATELETS: 200 10*3/uL (ref 150–400)
RBC: 3.31 MIL/uL — AB (ref 3.87–5.11)
RDW: 13.1 % (ref 11.5–15.5)
WBC: 6.5 10*3/uL (ref 4.0–10.5)

## 2014-11-25 LAB — LIPID PANEL
Cholesterol: 146 mg/dL (ref 0–200)
HDL: 46 mg/dL (ref >40)
LDL CALC: 80 mg/dL (ref 0–99)
TRIGLYCERIDES: 100 mg/dL (ref <150)
Total CHOL/HDL Ratio: 3.2 RATIO
VLDL: 20 mg/dL (ref 0–40)

## 2014-11-25 LAB — SURGICAL PCR SCREEN
MRSA, PCR: NEGATIVE
STAPHYLOCOCCUS AUREUS: NEGATIVE

## 2014-11-25 LAB — PROTIME-INR
INR: 1.12 (ref 0.00–1.49)
PROTHROMBIN TIME: 14.6 s (ref 11.6–15.2)

## 2014-11-25 LAB — TROPONIN I: Troponin I: 2.24 ng/mL (ref <0.031)

## 2014-11-25 SURGERY — LEFT HEART CATH AND CORONARY ANGIOGRAPHY
Anesthesia: LOCAL

## 2014-11-25 MED ORDER — MIDAZOLAM HCL 2 MG/2ML IJ SOLN
INTRAMUSCULAR | Status: DC | PRN
  Administered 2014-11-25 (×3): 1 mg via INTRAVENOUS

## 2014-11-25 MED ORDER — FENTANYL CITRATE (PF) 100 MCG/2ML IJ SOLN
INTRAMUSCULAR | Status: AC
  Filled 2014-11-25: qty 4

## 2014-11-25 MED ORDER — HEPARIN (PORCINE) IN NACL 2-0.9 UNIT/ML-% IJ SOLN
INTRAMUSCULAR | Status: AC
  Filled 2014-11-25: qty 1500

## 2014-11-25 MED ORDER — SODIUM CHLORIDE 0.9 % IJ SOLN
3.0000 mL | INTRAMUSCULAR | Status: DC | PRN
Start: 2014-11-25 — End: 2014-11-26

## 2014-11-25 MED ORDER — SODIUM CHLORIDE 0.9 % IJ SOLN
3.0000 mL | Freq: Two times a day (BID) | INTRAMUSCULAR | Status: DC
  Administered 2014-11-26: 3 mL via INTRAVENOUS

## 2014-11-25 MED ORDER — SODIUM CHLORIDE 0.9 % IV SOLN: INTRAVENOUS | Status: DC

## 2014-11-25 MED ORDER — FENTANYL CITRATE (PF) 100 MCG/2ML IJ SOLN
INTRAMUSCULAR | Status: DC | PRN
  Administered 2014-11-25 (×2): 25 ug via INTRAVENOUS

## 2014-11-25 MED ORDER — IOHEXOL 350 MG/ML SOLN
INTRAVENOUS | Status: DC | PRN
  Administered 2014-11-25: 68 mL via INTRA_ARTERIAL

## 2014-11-25 MED ORDER — LIDOCAINE HCL (PF) 1 % IJ SOLN
INTRAMUSCULAR | Status: DC | PRN
  Administered 2014-11-25: 13:00:00

## 2014-11-25 MED ORDER — MIDAZOLAM HCL 2 MG/2ML IJ SOLN
INTRAMUSCULAR | Status: AC
  Filled 2014-11-25: qty 4

## 2014-11-25 MED ORDER — SODIUM CHLORIDE 0.9 % IV SOLN: 250.0000 mL | INTRAVENOUS | Status: DC | PRN

## 2014-11-25 MED ORDER — LIDOCAINE HCL (PF) 1 % IJ SOLN
INTRAMUSCULAR | Status: AC
  Filled 2014-11-25: qty 30

## 2014-11-25 SURGICAL SUPPLY — 7 items
CATH INFINITI 5FR MULTPACK ANG (CATHETERS) ×2 IMPLANT
KIT HEART LEFT (KITS) ×2 IMPLANT
PACK CARDIAC CATHETERIZATION (CUSTOM PROCEDURE TRAY) ×2 IMPLANT
SHEATH PINNACLE 5F 10CM (SHEATH) ×2 IMPLANT
SYR MEDRAD MARK V 150ML (SYRINGE) ×2 IMPLANT
TRANSDUCER W/STOPCOCK (MISCELLANEOUS) ×2 IMPLANT
WIRE EMERALD 3MM-J .035X150CM (WIRE) ×2 IMPLANT

## 2014-11-25 NOTE — Clinical Documentation Improvement (Signed)
Cardiology  Can chest pain be further specified?   GERD  CAD  Angina  Other  Clinically Undetermined  Document any associated diagnoses/conditions.  Supporting Information:(As per Cath report) "Conclusion Non-cardiac chest pain."  Please exercise your independent, professional judgment when responding. A specific answer is not anticipated or expected.  Thank You, Alessandra Grout, RN, BSN, CCDS,Clinical Documentation Specialist:  [phone]  [phone]=[facility]- [facility] Information Management

## 2014-11-25 NOTE — Progress Notes (Signed)
CRITICAL VALUE ALERT  Critical value received:  Troponin 2.24  Date of notification:  11/25/2014  Time of notification:  0923  Critical value read back:Yes.    Nurse who received alert:  Alexia Freestone  MD notified (1st page):  Doylene Canard  Time of first page:  0448  MD notified (2nd page):[place]  Time of second page:0516  Responding MD:  Doylene Canard  Time MD responded:  58  Donson,Desiree, RN

## 2014-11-25 NOTE — Interval H&P Note (Signed)
History and Physical Interval Note:  11/25/2014 12:35 PM  Wanda Green  has presented today for surgery, with the diagnosis of cp  The various methods of treatment have been discussed with the patient and family. After consideration of risks, benefits and other options for treatment, the patient has consented to  Procedure(s): Left Heart Cath and Coronary Angiography (N/A) as a surgical intervention .  The patient's history has been reviewed, patient examined, no change in status, stable for surgery.  I have reviewed the patient's chart and labs.  Questions were answered to the patient's satisfaction.     Cambelle Suchecki S

## 2014-11-25 NOTE — Progress Notes (Signed)
Site area: rt groin fa sheath Site Prior to Removal:  Level 0 Pressure Applied For:  20 minutes Manual:   yes Patient Status During Pull:  stable Post Pull Site:  Level   0 Post Pull Instructions Given:  yes Post Pull Pulses Present: yes Dressing Applied:  tegaderm Bedrest begins @  9038 Comments:  0

## 2014-11-25 NOTE — Progress Notes (Signed)
Utilization review completed.  

## 2014-11-26 LAB — IRON AND TIBC
Iron: 69 ug/dL (ref 28–170)
Saturation Ratios: 21 % (ref 10.4–31.8)
TIBC: 333 ug/dL (ref 250–450)
UIBC: 264 ug/dL

## 2014-11-26 LAB — BASIC METABOLIC PANEL
ANION GAP: 5 (ref 5–15)
BUN: 11 mg/dL (ref 6–20)
CALCIUM: 9.3 mg/dL (ref 8.9–10.3)
CO2: 25 mmol/L (ref 22–32)
Chloride: 106 mmol/L (ref 101–111)
Creatinine, Ser: 1.02 mg/dL — ABNORMAL HIGH (ref 0.44–1.00)
GFR calc Af Amer: 60 mL/min — ABNORMAL LOW (ref >60)
GFR, EST NON AFRICAN AMERICAN: 52 mL/min — AB (ref >60)
Glucose, Bld: 105 mg/dL — ABNORMAL HIGH (ref 65–99)
POTASSIUM: 4.1 mmol/L (ref 3.5–5.1)
SODIUM: 136 mmol/L (ref 135–145)

## 2014-11-26 LAB — CBC
HCT: 31.6 % — ABNORMAL LOW (ref 36.0–46.0)
Hemoglobin: 10.5 g/dL — ABNORMAL LOW (ref 12.0–15.0)
MCH: 30.3 pg (ref 26.0–34.0)
MCHC: 33.2 g/dL (ref 30.0–36.0)
MCV: 91.3 fL (ref 78.0–100.0)
PLATELETS: 193 10*3/uL (ref 150–400)
RBC: 3.46 MIL/uL — AB (ref 3.87–5.11)
RDW: 13.3 % (ref 11.5–15.5)
WBC: 6 10*3/uL (ref 4.0–10.5)

## 2014-11-26 LAB — FERRITIN: Ferritin: 15 ng/mL (ref 11–307)

## 2014-11-26 LAB — TROPONIN I: TROPONIN I: 1.93 ng/mL — AB (ref <0.031)

## 2014-11-26 MED ORDER — ASPIRIN 81 MG PO TBEC: 81.0000 mg | DELAYED_RELEASE_TABLET | Freq: Every day | ORAL | Status: DC

## 2014-11-26 NOTE — Discharge Summary (Signed)
Physician Discharge Summary  Patient ID: Wanda Green MRN: [id number] DOB/AGE: June 25, 1934 80 y.o.  Admit date: 11/24/2014 Discharge date: 11/26/2014  Admission Diagnoses: Chest pain R/O MI Hypertension Dyslipidemia Arthritis H/O GI bleed  Discharge Diagnoses:  Principle Problem: Non-cardiac chest pain Possible GERD Ischemic collitis Abnormal cardiac enzymes Hypertension Dyslipidemia Arthritis H/O GI bleed Anemia of chronic blood loss  Discharged Condition: fair  Hospital Course: 80 year old female with substernal crushing type chest pain radiating to left jaw and arm, lastig for 2 hours. Associated symptoms: no dizziness, no fever, no headache, no nausea/vomiting, no palpitations and no shortness of breath.Her Troponin-I was elevated. She underwent cardiac catheterization that showed normal coronaries. Her chest pain is most likely from GERD and elevated cardiac enzymes from possible ischemic colitis. Her iron level was normal but recurrent small GI bleed is suspected from aspirin use or ischemic bowel. She will resume Omeprazole and see primary care doctor in - 1-2 weeks. She will see Dr. Cristina Gong on regular basis.  Consults: cardiology  Significant Diagnostic Studies: labs: Hgb 10.0. Normal WBC, Platelets count. Normal BMET except minimal elevation of blood sugar and creatinine. Normal Iron level. Elevated troponin-I, peaking at 2.24ng/mL  EKG-Sinus rhythm.  Chest x-ray: Normal  Cardiac catheterization: Normal coronaries.  Treatments: cardiac meds: aspirin, Losartan, metoprolol and amlodipine. Also Omeprazole and over the counter TUMs.  Discharge Exam: Blood pressure 131/72, pulse 73, temperature 98 F (36.7 C), temperature source Oral, resp. rate 18, height 5\' 3"  (1.6 m), weight 77.5 kg (170 lb 13.7 oz), SpO2 96 %. Constitutional: Well-developed and well-nourished. No distress.  HENT: Normocephalic and atraumatic. Oropharynx is clear and moist.  Eyes: No scleral  icterus. Conj-pale pink.  Neck: Normal range of motion. Neck supple. No JVD present. No tracheal deviation present. No thyromegaly present.  Cardiovascular: Normal rate, regular rhythm, normal heart sounds and intact distal pulses. Exam reveals no gallop and no friction rub. No murmur heard. Respiratory: Effort normal and breath sounds normal.  GI: Soft. Bowel sounds are normal, no distension or tenderness.  Musculoskeletal: Normal range of motion. She exhibits no edema.  Lymphadenopathy: No cervical adenopathy.  Neurological: She is alert. No cranial nerve deficit.  Skin: Skin is warm and dry. No rash noted. No erythema.  Psychiatric: She has a normal mood and affect.   Disposition: 01-Home or Self Care     Medication List    TAKE these medications        acetaminophen 500 MG tablet  Commonly known as:  TYLENOL  Take 500 mg by mouth every 6 (six) hours as needed for mild pain or moderate pain.     amLODipine 2.5 MG tablet  Commonly known as:  NORVASC  Take 2.5 mg by mouth at bedtime.     aspirin 81 MG EC tablet  Take 1 tablet (81 mg total) by mouth daily.     atorvastatin 20 MG tablet  Commonly known as:  LIPITOR  Take 20 mg by mouth every 3 (three) days.     cetirizine 10 MG tablet  Commonly known as:  ZYRTEC  Take 10 mg by mouth at bedtime.     losartan 100 MG tablet  Commonly known as:  COZAAR  Take 100 mg by mouth daily.     metoprolol succinate 50 MG 24 hr tablet  Commonly known as:  TOPROL-XL  Take 25-50 mg by mouth 2 (two) times daily. Take with or immediately following a meal. TAKES 25MG  IN AM AND 50MG  IN PM  omeprazole 40 MG capsule  Commonly known as:  PRILOSEC  Take 1 capsule (40 mg total) by mouth daily as needed.     oxybutynin 10 MG 24 hr tablet  Commonly known as:  DITROPAN-XL  TAKE 1 TABLET (10 MG TOTAL) BY MOUTH AT BEDTIME.           Follow-up Information    Follow up with Charolette Forward, MD. Schedule an appointment as soon as  possible for a visit in 2 weeks.   Specialty:  Cardiology   Contact information:   [facility] [address] [place] [address] [phone]       Signed: [name] 11/26/2014, 1:03 PM

## 2014-11-26 NOTE — Care Management Important Message (Signed)
Important Message  Patient Details  Name: Wanda Green MRN: [id number] Date of Birth: 06/09/1934   Medicare Important Message Given:  Yes-second notification given    Loann Quill 11/26/2014, 10:42 AM

## 2014-11-30 ENCOUNTER — Encounter (HOSPITAL_COMMUNITY): Payer: Self-pay

## 2014-11-30 ENCOUNTER — Emergency Department (HOSPITAL_COMMUNITY): Payer: PPO

## 2014-11-30 ENCOUNTER — Emergency Department (HOSPITAL_COMMUNITY)
Admission: EM | Admit: 2014-11-30 | Discharge: 2014-11-30 | Disposition: A | Discharge status: Home / Self Care | Payer: PPO | Attending: Emergency Medicine | Admitting: Emergency Medicine

## 2014-11-30 DIAGNOSIS — Z87891 Personal history of nicotine dependence: Secondary | ICD-10-CM | POA: Insufficient documentation

## 2014-11-30 DIAGNOSIS — Z880 Allergy status to penicillin: Secondary | ICD-10-CM | POA: Diagnosis not present

## 2014-11-30 DIAGNOSIS — Z853 Personal history of malignant neoplasm of breast: Secondary | ICD-10-CM | POA: Diagnosis not present

## 2014-11-30 DIAGNOSIS — Z9851 Tubal ligation status: Secondary | ICD-10-CM | POA: Insufficient documentation

## 2014-11-30 DIAGNOSIS — I10 Essential (primary) hypertension: Secondary | ICD-10-CM | POA: Insufficient documentation

## 2014-11-30 DIAGNOSIS — Z8669 Personal history of other diseases of the nervous system and sense organs: Secondary | ICD-10-CM | POA: Diagnosis not present

## 2014-11-30 DIAGNOSIS — R1032 Left lower quadrant pain: Secondary | ICD-10-CM | POA: Diagnosis present

## 2014-11-30 DIAGNOSIS — K529 Noninfective gastroenteritis and colitis, unspecified: Secondary | ICD-10-CM | POA: Insufficient documentation

## 2014-11-30 DIAGNOSIS — Z79899 Other long term (current) drug therapy: Secondary | ICD-10-CM | POA: Insufficient documentation

## 2014-11-30 DIAGNOSIS — E780 Pure hypercholesterolemia: Secondary | ICD-10-CM | POA: Insufficient documentation

## 2014-11-30 DIAGNOSIS — Z7982 Long term (current) use of aspirin: Secondary | ICD-10-CM | POA: Insufficient documentation

## 2014-11-30 DIAGNOSIS — Z9889 Other specified postprocedural states: Secondary | ICD-10-CM | POA: Diagnosis not present

## 2014-11-30 DIAGNOSIS — M1990 Unspecified osteoarthritis, unspecified site: Secondary | ICD-10-CM | POA: Diagnosis not present

## 2014-11-30 LAB — COMPREHENSIVE METABOLIC PANEL
ALK PHOS: 47 U/L (ref 38–126)
ALT: 15 U/L (ref 14–54)
AST: 26 U/L (ref 15–41)
Albumin: 4.3 g/dL (ref 3.5–5.0)
Anion gap: 13 (ref 5–15)
BUN: 29 mg/dL — ABNORMAL HIGH (ref 6–20)
CALCIUM: 9.6 mg/dL (ref 8.9–10.3)
CO2: 20 mmol/L — ABNORMAL LOW (ref 22–32)
CREATININE: 1.44 mg/dL — AB (ref 0.44–1.00)
Chloride: 105 mmol/L (ref 101–111)
GFR, EST AFRICAN AMERICAN: 39 mL/min — AB (ref >60)
GFR, EST NON AFRICAN AMERICAN: 34 mL/min — AB (ref >60)
Glucose, Bld: 128 mg/dL — ABNORMAL HIGH (ref 65–99)
Potassium: 4.5 mmol/L (ref 3.5–5.1)
Sodium: 138 mmol/L (ref 135–145)
Total Bilirubin: 0.9 mg/dL (ref 0.3–1.2)
Total Protein: 7.5 g/dL (ref 6.5–8.1)

## 2014-11-30 LAB — URINALYSIS, ROUTINE W REFLEX MICROSCOPIC
Glucose, UA: NEGATIVE mg/dL
Hgb urine dipstick: NEGATIVE
KETONES UR: NEGATIVE mg/dL
NITRITE: NEGATIVE
PROTEIN: NEGATIVE mg/dL
Specific Gravity, Urine: 1.02 (ref 1.005–1.030)
UROBILINOGEN UA: 1 mg/dL (ref 0.0–1.0)
pH: 5 (ref 5.0–8.0)

## 2014-11-30 LAB — LIPASE, BLOOD: Lipase: 20 U/L — ABNORMAL LOW (ref 22–51)

## 2014-11-30 LAB — CBC
HCT: 32.2 % — ABNORMAL LOW (ref 36.0–46.0)
Hemoglobin: 10.5 g/dL — ABNORMAL LOW (ref 12.0–15.0)
MCH: 30.3 pg (ref 26.0–34.0)
MCHC: 32.6 g/dL (ref 30.0–36.0)
MCV: 92.8 fL (ref 78.0–100.0)
PLATELETS: 234 10*3/uL (ref 150–400)
RBC: 3.47 MIL/uL — AB (ref 3.87–5.11)
RDW: 13.3 % (ref 11.5–15.5)
WBC: 14.2 10*3/uL — ABNORMAL HIGH (ref 4.0–10.5)

## 2014-11-30 LAB — URINE MICROSCOPIC-ADD ON

## 2014-11-30 LAB — TYPE AND SCREEN
ABO/RH(D): O POS
ANTIBODY SCREEN: NEGATIVE

## 2014-11-30 MED ORDER — ONDANSETRON 8 MG PO TBDP: 8.0000 mg | ORAL_TABLET | Freq: Three times a day (TID) | ORAL | Status: DC | PRN

## 2014-11-30 MED ORDER — CIPROFLOXACIN HCL 500 MG PO TABS: 500.0000 mg | ORAL_TABLET | Freq: Two times a day (BID) | ORAL | Status: DC

## 2014-11-30 MED ORDER — LORAZEPAM 2 MG/ML IJ SOLN
0.5000 mg | Freq: Once | INTRAMUSCULAR | Status: DC | PRN
  Administered 2014-11-30: 0.5 mg via INTRAVENOUS
  Filled 2014-11-30: qty 1

## 2014-11-30 MED ORDER — IOHEXOL 300 MG/ML  SOLN
75.0000 mL | Freq: Once | INTRAMUSCULAR | Status: AC | PRN
  Administered 2014-11-30: 100 mL via INTRAVENOUS

## 2014-11-30 MED ORDER — ONDANSETRON 4 MG PO TBDP
ORAL_TABLET | ORAL | Status: AC
  Administered 2014-11-30: 4 mg via ORAL
  Filled 2014-11-30: qty 1

## 2014-11-30 MED ORDER — CIPROFLOXACIN HCL 500 MG PO TABS
500.0000 mg | ORAL_TABLET | Freq: Once | ORAL | Status: AC
  Administered 2014-11-30: 500 mg via ORAL
  Filled 2014-11-30: qty 1

## 2014-11-30 MED ORDER — METRONIDAZOLE 500 MG PO TABS
500.0000 mg | ORAL_TABLET | Freq: Once | ORAL | Status: AC
  Administered 2014-11-30: 500 mg via ORAL
  Filled 2014-11-30: qty 1

## 2014-11-30 MED ORDER — ONDANSETRON 4 MG PO TBDP
4.0000 mg | ORAL_TABLET | Freq: Once | ORAL | Status: AC | PRN
  Administered 2014-11-30: 4 mg via ORAL

## 2014-11-30 MED ORDER — HYDROCODONE-ACETAMINOPHEN 5-325 MG PO TABS: 1.0000 | ORAL_TABLET | Freq: Four times a day (QID) | ORAL | Status: DC | PRN

## 2014-11-30 MED ORDER — METRONIDAZOLE 500 MG PO TABS: 500.0000 mg | ORAL_TABLET | Freq: Two times a day (BID) | ORAL | Status: DC

## 2014-11-30 MED ORDER — MORPHINE SULFATE (PF) 4 MG/ML IV SOLN
6.0000 mg | Freq: Once | INTRAVENOUS | Status: AC
  Administered 2014-11-30: 6 mg via INTRAVENOUS
  Filled 2014-11-30: qty 2

## 2014-11-30 MED ORDER — SODIUM CHLORIDE 0.9 % IV BOLUS (SEPSIS)
1000.0000 mL | Freq: Once | INTRAVENOUS | Status: AC
  Administered 2014-11-30: 1000 mL via INTRAVENOUS

## 2014-11-30 NOTE — ED Notes (Signed)
Pt reports she can take zofran with no reactions or problems.

## 2014-11-30 NOTE — ED Notes (Signed)
Pt sats decreased to 88% on room air; placed pt on 2L [place], sats improved to 99%; pt reports wearing cpap at night

## 2014-11-30 NOTE — ED Notes (Signed)
Pt taken to CT.

## 2014-11-30 NOTE — Discharge Instructions (Signed)

## 2014-11-30 NOTE — ED Notes (Signed)
MD at bedside. 

## 2014-11-30 NOTE — ED Notes (Signed)
Pt reports LLQ abdominal pain onset tonight around 7pm associated with emesis x 15 and bloody diarrhea. Pt was admitted here last week for three days for ischemic colitis and discharged Friday.

## 2014-11-30 NOTE — ED Provider Notes (Signed)
CSN: [id number]     Arrival date & time 11/30/14  0100 History  By signing my name below, I, Irene Pap, attest that this documentation has been prepared under the direction and in the presence of No att. providers found. Electronically Signed: Irene Pap, ED Scribe. 11/30/2014. 1:58 AM.  Chief Complaint  Patient presents with  . Abdominal Pain  . Emesis   The history is provided by the patient. No language interpreter was used.  HPI Comments: Wanda Green is a 80 y.o. Female with hx of ischemic colitis who presents to the Emergency Department complaining of sudden onset LLQ abdominal pain onset 6 hours ago. Pt reports associated nausea, emesis  x15 and bloody diarrhea. Pt currently rates pain 5/10; rated pain 10/10 at its worst. Pt was given Zofran in Triage to relief. Pt was recently discharged in June from a hospital admission for ischemic colitis. Pt states that her flare ups typically last for a few hours, but she bled for a few days in June. Denies fever or chills. Denies hx of diverticulitis or use of blood thinners. Pt had a colonoscopy 2 weeks ago with no abnormalities.   Past Medical History  Diagnosis Date  . Breast cancer   . Hypertension   . Osteoporosis 06/2012    T score -2.5  . Colitis, ischemic   . Elevated cholesterol   . Cervical dysplasia age 29  . Arthritis   . Sleep apnea    Past Surgical History  Procedure Laterality Date  . Bi lateral knee scope    . Breast surgery  2000    RIGHT BREAST LUMPECTOMY  . Tubal ligation    . Colposcopy    . Cervical cone biopsy  age 28  . Nasal sinus surgery  12/14  . Colonoscopy with propofol N/A 09/01/2014    Procedure: COLONOSCOPY WITH PROPOFOL;  Surgeon: Ronald Lobo, MD;  Location: [facility];  Service: Endoscopy;  Laterality: N/A;  this will be done unprepped  . Cardiac catheterization N/A 11/25/2014    Procedure: Left Heart Cath and Coronary Angiography;  Surgeon: Dixie Dials, MD;  Location: [facility];  Service: Cardiovascular;  Laterality: N/A;   Family History  Problem Relation Age of Onset  . Hypertension Mother   . Heart failure Mother   . Heart disease Mother   . Hypertension Father   . Heart disease Father   . Hypertension Sister   . Uterine cancer Sister   . Hypertension Brother    Social History  Substance Use Topics  . Smoking status: Former Smoker -- 0.10 packs/day for 3 years    Types: Cigarettes    Quit date: 03/05/1972  . Smokeless tobacco: Never Used     Comment: ONE CIG A DAY  . Alcohol Use: No   OB History    Gravida Para Term Preterm AB TAB SAB Ectopic Multiple Living   3 3 3       3      Review of Systems  Constitutional: Negative for fever and chills.  Gastrointestinal: Positive for vomiting, abdominal pain, diarrhea and blood in stool.  All other systems reviewed and are negative.  Allergies  Penicillins; Atorvastatin; Augmentin; Codeine; Doxycycline; Hydrocodone; and Oxycodone-acetaminophen  Home Medications   Prior to Admission medications   Medication Sig Start Date End Date Taking? Authorizing Provider  acetaminophen (TYLENOL) 500 MG tablet Take 500 mg by mouth every 6 (six) hours as needed for mild pain or moderate pain.    Historical  Provider, MD  amLODipine (NORVASC) 2.5 MG tablet Take 2.5 mg by mouth at bedtime. 11/15/14   Historical Provider, MD  aspirin EC 81 MG EC tablet Take 1 tablet (81 mg total) by mouth daily. 11/26/14   Dixie Dials, MD  atorvastatin (LIPITOR) 20 MG tablet Take 20 mg by mouth every 3 (three) days.     Historical Provider, MD  cetirizine (ZYRTEC) 10 MG tablet Take 10 mg by mouth at bedtime.    Historical Provider, MD  losartan (COZAAR) 100 MG tablet Take 100 mg by mouth daily.    Historical Provider, MD  metoprolol succinate (TOPROL-XL) 50 MG 24 hr tablet Take 25-50 mg by mouth 2 (two) times daily. Take with or immediately following a meal. TAKES 25MG  IN AM AND 50MG  IN PM    Historical Provider, MD  omeprazole  (PRILOSEC) 40 MG capsule Take 1 capsule (40 mg total) by mouth daily as needed. Patient taking differently: Take 40 mg by mouth daily as needed (for heartburn).  07/14/12   Leandrew Koyanagi, MD  oxybutynin (DITROPAN-XL) 10 MG 24 hr tablet TAKE 1 TABLET (10 MG TOTAL) BY MOUTH AT BEDTIME. Patient taking differently: TAKE 1 TABLET (10 MG TOTAL) BY MOUTH EVERY MORNING 02/18/14   Timothy P Fontaine, MD   BP 100/45 mmHg  Pulse 65  Temp(Src) 98.2 F (36.8 C) (Oral)  Resp 16  Ht 5' 3.5" (1.613 m)  Wt 170 lb (77.111 kg)  BMI 29.64 kg/m2  SpO2 99%  Physical Exam  Constitutional: She is oriented to person, place, and time. She appears well-developed and well-nourished. No distress.  HENT:  Head: Normocephalic and atraumatic.  Eyes: EOM are normal.  Neck: Normal range of motion.  Cardiovascular: Normal rate, regular rhythm and normal heart sounds.   Pulmonary/Chest: Effort normal and breath sounds normal.  Abdominal: Soft. She exhibits no distension. There is no tenderness.  Mild LLQ tenderness  Musculoskeletal: Normal range of motion.  Neurological: She is alert and oriented to person, place, and time.  Skin: Skin is warm and dry.  Psychiatric: She has a normal mood and affect. Judgment normal.  Nursing note and vitals reviewed.   ED Course  Procedures (including critical care time) DIAGNOSTIC STUDIES: Oxygen Saturation is 99% on RA, normal by my interpretation.    COORDINATION OF CARE: 2:39 AM-Discussed treatment plan which includes labs with pt at bedside and pt agreed to plan.   Labs Review Labs Reviewed  LIPASE, BLOOD - Abnormal; Notable for the following:    Lipase 20 (*)    All other components within normal limits  COMPREHENSIVE METABOLIC PANEL - Abnormal; Notable for the following:    CO2 20 (*)    Glucose, Bld 128 (*)    BUN 29 (*)    Creatinine, Ser 1.44 (*)    GFR calc non Af Amer 34 (*)    GFR calc Af Amer 39 (*)    All other components within normal limits  CBC  - Abnormal; Notable for the following:    WBC 14.2 (*)    RBC 3.47 (*)    Hemoglobin 10.5 (*)    HCT 32.2 (*)    All other components within normal limits  URINALYSIS, ROUTINE W REFLEX MICROSCOPIC (NOT AT [facility]) - Abnormal; Notable for the following:    Color, Urine AMBER (*)    APPearance CLOUDY (*)    Bilirubin Urine SMALL (*)    Leukocytes, UA TRACE (*)    All other components within normal limits  URINE MICROSCOPIC-ADD ON  TYPE AND SCREEN    Imaging Review Ct Abdomen Pelvis W Contrast  11/30/2014   CLINICAL DATA:  LEFT lower quadrant pain beginning tonight around 7 p.m., vomiting, bloody diarrhea. Discharge from hospital 3 days ago for ischemic colitis.  EXAM: CT ABDOMEN AND PELVIS WITH CONTRAST  TECHNIQUE: Multidetector CT imaging of the abdomen and pelvis was performed using the standard protocol following bolus administration of intravenous contrast.  CONTRAST:  153mL OMNIPAQUE IOHEXOL 300 MG/ML  SOLN  COMPARISON:  CT abdomen and pelvis August 30, 2014  FINDINGS: Respiratory motion degraded examination.  LUNG BASES: Respiratory motion limits evaluation. RIGHT middle lobe atelectasis/ scarring. The heart size is mildly enlarged. Mitral annular calcifications. No pericardial effusions.  SOLID ORGANS: The liver demonstrates subcentimeter probable, unchanged. Punctate granuloma LEFT lobe of the liver versus, diaphragmatic calcification. Spleen, gallbladder, pancreas and adrenal glands are unremarkable.  GASTROINTESTINAL TRACT: Distal transverse and descending colonic wall thickening with mild pericolonic inflammation, improved from prior CT. A few scattered colonic diverticula noted. Small amount of small bowel feces compatible with chronic stasis. Small to moderate hiatal hernia. The stomach, small bowel are normal in course and caliber without inflammatory changes. Normal appendix.  KIDNEYS/ URINARY TRACT: Kidneys are orthotopic, demonstrating symmetric enhancement. No nephrolithiasis,  hydronephrosis or solid renal masses. The unopacified ureters are normal in course and caliber. Delayed imaging through the kidneys demonstrates symmetric prompt contrast excretion within the proximal urinary collecting system. Urinary bladder is partially distended and unremarkable.  PERITONEUM/RETROPERITONEUM: Aortoiliac vessels are normal in course and caliber, mild calcific atherosclerosis. No lymphadenopathy by CT size criteria. Stable 18 mm LEFT adnexal cyst. No intraperitoneal free fluid nor free air.  SOFT TISSUE/OSSEOUS STRUCTURES: RIGHT inguinal fat stranding, and small amount of free fluid. Tiny fat containing umbilical hernia. Grade 1 L4-5 anterolisthesis on degenerative basis with moderate to severe lower lumbar facet arthropathy, no spondylolysis.  IMPRESSION: Recurrent transverse and descending colitis without complication.  Fat stranding and small effusion RIGHT inguinal soft tissues could represent recent vascular access.   Electronically Signed   By: Elon Alas M.D.   On: 11/30/2014 06:12  I personally reviewed the imaging tests through PACS system I reviewed available ER/hospitalization records through the EMR     EKG Interpretation None      MDM   Final diagnoses:  Colitis    7:10 AM Patient feels much better this time.  This appears to be colitis again.  This could represent ischemic colitis.  Given her elevated white blood cell count I will cover her with Cipro Flagyl for 1 week.  I've instructed her to follow-up with her gastroenterologist.  I personally performed the services described in this documentation, which was scribed in my presence. The recorded information has been reviewed and is accurate.      Jola Schmidt, MD 11/30/14 [phone]

## 2014-11-30 NOTE — ED Notes (Signed)
IV team at bedside 

## 2014-11-30 NOTE — ED Notes (Signed)
IV attempt x2 without success.

## 2014-11-30 NOTE — ED Notes (Signed)
NAD at this time. Pt is stable and going home.  

## 2014-11-30 NOTE — ED Notes (Signed)
IV attempt x 1 without success.

## 2014-12-14 ENCOUNTER — Telehealth (HOSPITAL_COMMUNITY): Payer: Self-pay | Admitting: Cardiac Rehabilitation

## 2014-12-14 NOTE — Telephone Encounter (Signed)
[employer] insurance verification  Per [employer] rep:  PPO plan 1.  $15 copay per visit.  No deductible.  $3400 OOP max of which $1415 has been met this calender year.  Preauthorization not necessary for [id number].  Pt made aware of these benefits.

## 2014-12-21 ENCOUNTER — Telehealth (HOSPITAL_COMMUNITY): Payer: Self-pay | Admitting: Cardiac Rehabilitation

## 2014-12-21 NOTE — Telephone Encounter (Signed)
pc received from pt to cancel cardiac rehab classes. Pt unable to afford $30 copay per visit.  Pt plans to exercise on her own at home.  Pt advised to discuss home elipitical use with Dr. Terrence Dupont prior to starting that level of activity independently. Pt also given information about the cardiac maintenance program.  Understanding verbalized.  Dr. Terrence Dupont made aware.

## 2014-12-30 ENCOUNTER — Ambulatory Visit (HOSPITAL_COMMUNITY): Payer: PPO

## 2015-01-03 ENCOUNTER — Ambulatory Visit (HOSPITAL_COMMUNITY): Payer: PPO

## 2015-01-05 ENCOUNTER — Ambulatory Visit (HOSPITAL_COMMUNITY): Payer: PPO

## 2015-01-07 ENCOUNTER — Ambulatory Visit (HOSPITAL_COMMUNITY): Payer: PPO

## 2015-01-10 ENCOUNTER — Ambulatory Visit (HOSPITAL_COMMUNITY): Payer: PPO

## 2015-01-12 ENCOUNTER — Ambulatory Visit (HOSPITAL_COMMUNITY): Payer: PPO

## 2015-01-14 ENCOUNTER — Encounter (HOSPITAL_BASED_OUTPATIENT_CLINIC_OR_DEPARTMENT_OTHER): Payer: PPO

## 2015-01-14 ENCOUNTER — Ambulatory Visit (HOSPITAL_COMMUNITY): Payer: PPO

## 2015-01-17 ENCOUNTER — Ambulatory Visit (HOSPITAL_COMMUNITY): Payer: PPO

## 2015-01-19 ENCOUNTER — Ambulatory Visit (HOSPITAL_COMMUNITY): Payer: PPO

## 2015-01-21 ENCOUNTER — Ambulatory Visit (HOSPITAL_COMMUNITY): Payer: PPO

## 2015-01-24 ENCOUNTER — Ambulatory Visit (HOSPITAL_COMMUNITY): Payer: PPO

## 2015-01-26 ENCOUNTER — Ambulatory Visit (HOSPITAL_COMMUNITY): Payer: PPO

## 2015-01-31 ENCOUNTER — Ambulatory Visit (HOSPITAL_COMMUNITY): Payer: PPO

## 2015-02-02 ENCOUNTER — Ambulatory Visit (HOSPITAL_COMMUNITY): Payer: PPO

## 2015-02-04 ENCOUNTER — Ambulatory Visit (HOSPITAL_COMMUNITY): Payer: PPO

## 2015-02-07 ENCOUNTER — Ambulatory Visit (HOSPITAL_COMMUNITY): Payer: PPO

## 2015-02-09 ENCOUNTER — Ambulatory Visit (HOSPITAL_COMMUNITY): Payer: PPO

## 2015-02-11 ENCOUNTER — Ambulatory Visit (HOSPITAL_COMMUNITY): Payer: PPO

## 2015-02-14 ENCOUNTER — Ambulatory Visit (HOSPITAL_COMMUNITY): Payer: PPO

## 2015-02-16 ENCOUNTER — Ambulatory Visit (HOSPITAL_COMMUNITY): Payer: PPO

## 2015-02-18 ENCOUNTER — Ambulatory Visit (HOSPITAL_COMMUNITY): Payer: PPO

## 2015-02-21 ENCOUNTER — Ambulatory Visit (HOSPITAL_COMMUNITY): Payer: PPO

## 2015-02-23 ENCOUNTER — Ambulatory Visit (HOSPITAL_COMMUNITY): Payer: PPO

## 2015-02-25 ENCOUNTER — Ambulatory Visit (HOSPITAL_COMMUNITY): Payer: PPO

## 2015-03-02 ENCOUNTER — Ambulatory Visit (HOSPITAL_COMMUNITY): Payer: PPO

## 2015-03-04 ENCOUNTER — Ambulatory Visit (HOSPITAL_COMMUNITY): Payer: PPO

## 2015-03-09 ENCOUNTER — Ambulatory Visit (HOSPITAL_COMMUNITY): Payer: PPO

## 2015-03-11 ENCOUNTER — Ambulatory Visit (HOSPITAL_COMMUNITY): Payer: PPO

## 2015-03-14 ENCOUNTER — Ambulatory Visit (HOSPITAL_COMMUNITY): Payer: PPO

## 2015-03-16 ENCOUNTER — Ambulatory Visit (HOSPITAL_COMMUNITY): Payer: PPO

## 2015-03-17 ENCOUNTER — Ambulatory Visit (HOSPITAL_BASED_OUTPATIENT_CLINIC_OR_DEPARTMENT_OTHER): Payer: PPO | Attending: Internal Medicine

## 2015-03-17 DIAGNOSIS — G4730 Sleep apnea, unspecified: Secondary | ICD-10-CM | POA: Insufficient documentation

## 2015-03-17 DIAGNOSIS — G4733 Obstructive sleep apnea (adult) (pediatric): Secondary | ICD-10-CM | POA: Diagnosis not present

## 2015-03-18 ENCOUNTER — Ambulatory Visit (HOSPITAL_COMMUNITY): Payer: PPO

## 2015-03-18 DIAGNOSIS — K5900 Constipation, unspecified: Secondary | ICD-10-CM | POA: Diagnosis not present

## 2015-03-18 DIAGNOSIS — K559 Vascular disorder of intestine, unspecified: Secondary | ICD-10-CM | POA: Diagnosis not present

## 2015-03-18 DIAGNOSIS — D509 Iron deficiency anemia, unspecified: Secondary | ICD-10-CM | POA: Diagnosis not present

## 2015-03-19 ENCOUNTER — Other Ambulatory Visit: Payer: Self-pay | Admitting: Gynecology

## 2015-03-21 ENCOUNTER — Ambulatory Visit (HOSPITAL_COMMUNITY): Payer: PPO

## 2015-03-22 DIAGNOSIS — D509 Iron deficiency anemia, unspecified: Secondary | ICD-10-CM | POA: Diagnosis not present

## 2015-03-23 ENCOUNTER — Ambulatory Visit (HOSPITAL_COMMUNITY): Payer: PPO

## 2015-03-23 DIAGNOSIS — G4733 Obstructive sleep apnea (adult) (pediatric): Secondary | ICD-10-CM | POA: Diagnosis not present

## 2015-03-23 NOTE — Progress Notes (Signed)
Patient Name: Wanda Green, Wanda Green Date: 03/17/2015 Gender: Female D.O.B: [birth year] Age (years): 80 Referring Provider: Laverle Hobby Height (inches): 32 Interpreting Physician: Chesley Mires MD, ABSM Weight (lbs): 170 RPSGT: Baxter Flattery BMI: 30 MRN: [id number] Neck Size: 15.00  CLINICAL INFORMATION The patient is referred for a CPAP titration to treat sleep apnea.   Date of NPSG, Split Night or HST: 09/01/12.  SLEEP STUDY TECHNIQUE As per the AASM Manual for the Scoring of Sleep and Associated Events v2.3 (April 2016) with a hypopnea requiring 4% desaturations. The channels recorded and monitored were frontal, central and occipital EEG, electrooculogram (EOG), submentalis EMG (chin), nasal and oral airflow, thoracic and abdominal wall motion, anterior tibialis EMG, snore microphone, electrocardiogram, and pulse oximetry. Continuous positive airway pressure (CPAP) was initiated at the beginning of the study and titrated to treat sleep-disordered breathing.  MEDICATIONS Medications taken by the patient : reviewed in electronic medical record. Medications administered by patient during sleep study : No sleep medicine administered.  TECHNICIAN COMMENTS Comments added by technician: Patient was very cooperative, and slept in all sleep position. Comments added by scorer: N/A  RESPIRATORY PARAMETERS Optimal PAP Pressure (cm): 11 AHI at Optimal Pressure (/hr): 0.0 Overall Minimal O2 (%): 88.00 Supine % at Optimal Pressure (%): 0 Minimal O2 at Optimal Pressure (%): 94.0    SLEEP ARCHITECTURE The study was initiated at 10:40:34 PM and ended at 4:48:14 AM. Sleep onset time was 24.1 minutes and the sleep efficiency was 86.0%. The total sleep time was 316.0 minutes. The patient spent 1.90% of the night in stage N1 sleep, 37.83% in stage N2 sleep, 53.79% in stage N3 and 6.49% in REM.Stage REM latency was 320.5 minutes Wake after sleep onset was 27.5. Alpha intrusion was absent. Supine  sleep was 30.85%.  CARDIAC DATA The 2 lead EKG demonstrated sinus rhythm. The mean heart rate was 61.12 beats per minute. Other EKG findings include: None.  LEG MOVEMENT DATA The total Periodic Limb Movements of Sleep (PLMS) were 19. The PLMS index was 3.61. A PLMS index of <15 is considered normal in adults.  IMPRESSIONS This was a successful CPAP titration study.  She did well with CPAP 11 cm H2O.  She was observed in REM sleep at this pressure setting.  DIAGNOSIS - Obstructive Sleep Apnea (327.23 [G47.33 ICD-10])  RECOMMENDATIONS - Trial of CPAP therapy on 11 cm H2O. - She was fitted with a Small size Resmed Nasal Pillow Mask Swift FX mask.   Chesley Mires, MD, [place], American Board of Sleep Medicine 03/23/2015, 4:24 PM  NPI: [id number]

## 2015-03-25 ENCOUNTER — Ambulatory Visit (HOSPITAL_COMMUNITY): Payer: PPO

## 2015-03-25 DIAGNOSIS — I480 Paroxysmal atrial fibrillation: Secondary | ICD-10-CM | POA: Diagnosis not present

## 2015-03-25 DIAGNOSIS — I252 Old myocardial infarction: Secondary | ICD-10-CM | POA: Diagnosis not present

## 2015-03-25 DIAGNOSIS — I10 Essential (primary) hypertension: Secondary | ICD-10-CM | POA: Diagnosis not present

## 2015-03-25 DIAGNOSIS — E785 Hyperlipidemia, unspecified: Secondary | ICD-10-CM | POA: Diagnosis not present

## 2015-03-28 ENCOUNTER — Ambulatory Visit (HOSPITAL_COMMUNITY): Payer: PPO

## 2015-03-30 ENCOUNTER — Ambulatory Visit (HOSPITAL_COMMUNITY): Payer: PPO

## 2015-04-01 ENCOUNTER — Ambulatory Visit (INDEPENDENT_AMBULATORY_CARE_PROVIDER_SITE_OTHER): Payer: PPO | Admitting: Internal Medicine

## 2015-04-01 ENCOUNTER — Ambulatory Visit (HOSPITAL_COMMUNITY): Payer: PPO

## 2015-04-01 ENCOUNTER — Encounter: Payer: Self-pay | Admitting: Internal Medicine

## 2015-04-01 VITALS — BP 124/76 | HR 77 | Ht 63.5 in | Wt 178.0 lb

## 2015-04-01 DIAGNOSIS — G4733 Obstructive sleep apnea (adult) (pediatric): Secondary | ICD-10-CM | POA: Diagnosis not present

## 2015-04-01 DIAGNOSIS — G4719 Other hypersomnia: Secondary | ICD-10-CM | POA: Diagnosis not present

## 2015-04-01 NOTE — Addendum Note (Signed)
Addended by: Maryanna Shape A on: 04/01/2015 11:39 AM   Modules accepted: Orders

## 2015-04-01 NOTE — Patient Instructions (Addendum)
Increase CPAP pressure to 11.  Recommend that you try to get at least 7 hours of sleep per night.

## 2015-04-01 NOTE — Progress Notes (Signed)
* [facility]     Assessment and Plan:  Obstructive sleep apnea  -The patient underwent a CPAP titration showed that she required a CPAP at a pressure of 11. -We will increase her CPAP pressure to 11.  Excessive daytime somnolence-short sleep. -The patient continues to have daytime sleepiness despite being at the recommended CPAP level. -Patient currently gets about 6 hours of sleep per night at most, which may be inadequate for her. Advised her to try to get at least 7 hours of sleep per night.   Essential hypertension -Sleep apnea can contribute to essential hypertension. Therefore, it is important that this be adequately treated.  Atrial fibrillation . -History of atrial fibrillation in the past, currently controlled.  TMJ -The patient has TMJ dysfunction, contributing to a slightly narrow oral cavity opening. This may be contributing to worsening sleep apnea.   Date: 04/01/2015  MRN# [id number] Wanda Green February 24, 1935   Wanda Green is a 81 y.o. old female seen in follow up for chief complaint of  Chief Complaint  Patient presents with  . Follow-up     HPI:   The patient is a 81 year old female, with a history of atrial fibrillation, hypertension, sleep apnea. Last visit he was noted that she was having continued daytime sleepiness despite being on her recommended CPAP level, which was obtained off of a auto titrating test. She was subsequently recommended to undergo an in lab CPAP titration. She completed this test, and achieved REM sleep at a CPAP of 11.  Medication:   Outpatient Encounter Prescriptions as of 04/01/2015  Medication Sig  . acetaminophen (TYLENOL) 500 MG tablet Take 500 mg by mouth every 6 (six) hours as needed for mild pain or moderate pain.  Marland Kitchen amLODipine (NORVASC) 2.5 MG tablet Take 2.5 mg by mouth at bedtime.  Marland Kitchen aspirin EC 81 MG EC tablet Take 1 tablet (81 mg total) by mouth daily.  Marland Kitchen atorvastatin (LIPITOR) 20 MG tablet  Take 20 mg by mouth every 3 (three) days.   . cetirizine (ZYRTEC) 10 MG tablet Take 10 mg by mouth at bedtime.  . ciprofloxacin (CIPRO) 500 MG tablet Take 1 tablet (500 mg total) by mouth 2 (two) times daily.  Marland Kitchen HYDROcodone-acetaminophen (NORCO) 5-325 MG per tablet Take 1 tablet by mouth every 6 (six) hours as needed for moderate pain.  Marland Kitchen losartan (COZAAR) 100 MG tablet Take 100 mg by mouth daily.  . metoprolol succinate (TOPROL-XL) 50 MG 24 hr tablet Take 25-50 mg by mouth 2 (two) times daily. Take with or immediately following a meal. TAKES 25MG  IN AM AND 50MG  IN PM  . metroNIDAZOLE (FLAGYL) 500 MG tablet Take 1 tablet (500 mg total) by mouth 2 (two) times daily.  Marland Kitchen omeprazole (PRILOSEC) 40 MG capsule Take 1 capsule (40 mg total) by mouth daily as needed. (Patient taking differently: Take 40 mg by mouth daily as needed (for heartburn). )  . ondansetron (ZOFRAN ODT) 8 MG disintegrating tablet Take 1 tablet (8 mg total) by mouth every 8 (eight) hours as needed for nausea or vomiting.  Marland Kitchen oxybutynin (DITROPAN-XL) 10 MG 24 hr tablet TAKE 1 TABLET (10 MG TOTAL) BY MOUTH AT BEDTIME.   No facility-administered encounter medications on file as of 04/01/2015.     Allergies:  Penicillins; Atorvastatin; Augmentin; Codeine; Doxycycline; Hydrocodone; and Oxycodone-acetaminophen  Review of Systems: Gen:  Denies  fever, sweats. HEENT: Denies blurred vision. Cvc:  No dizziness, chest pain or heaviness Resp:   Denies cough  or sputum porduction. Gi: Denies swallowing difficulty, stomach pain.  Gu:  Denies bladder incontinence, burning urine Ext:   No Joint pain, stiffness. Skin: No skin rash, easy bruising. Endoc:  No polyuria, polydipsia. Psych: No depression, insomnia. Other:  All other systems were reviewed and found to be negative other than what is mentioned in the HPI.   Physical Examination:   VS: BP 124/76 mmHg  Pulse 77  Ht 5' 3.5" (1.613 m)  Wt 178 lb (80.74 kg)  BMI 31.03 kg/m2  SpO2  96%  General Appearance: No distress  Neuro:without focal findings,  speech normal,  HEENT: PERRLA, EOM intact. Pulmonary: normal breath sounds, No wheezing.   CardiovascularNormal S1,S2.  No m/r/g.   Abdomen: Benign, Soft, non-tender. Renal:  No costovertebral tenderness  GU:  Not performed at this time. Endoc: No evident thyromegaly, no signs of acromegaly. Skin:   warm, no rash. Extremities: normal, no cyanosis, clubbing.   LABORATORY PANEL:   CBC No results for input(s): WBC, HGB, HCT, PLT in the last 168 hours. ------------------------------------------------------------------------------------------------------------------  Chemistries  No results for input(s): NA, K, CL, CO2, GLUCOSE, BUN, CREATININE, CALCIUM, MG, AST, ALT, ALKPHOS, BILITOT in the last 168 hours.  Invalid input(s): GFRCGP ------------------------------------------------------------------------------------------------------------------  Cardiac Enzymes No results for input(s): TROPONINI in the last 168 hours. ------------------------------------------------------------  RADIOLOGY:   No results found for this or any previous visit. Results for orders placed during the hospital encounter of 11/24/14  DG Chest 2 View   Narrative CLINICAL DATA:  Chest pain and shortness of breath with chest pain centrally radiating to back.  EXAM: CHEST  2 VIEW  COMPARISON:  04/27/2006  FINDINGS: Lungs are adequately inflated without focal consolidation or effusion. Cardiomediastinal silhouette is within normal. There is mild calcified plaque over the thoracic aorta. Multiple surgical clips over the right chest. Remainder of the exam is unchanged.  IMPRESSION: No active cardiopulmonary disease.   Electronically Signed   By: Marin Olp M.D.   On: 11/24/2014 15:05    ------------------------------------------------------------------------------------------------------------------  Thank  you for allowing  [facility] to assist in the care of your patient. Our recommendations are noted above.  Please contact us if we can be of further service.   Marda Stalker, MD.  [facility] Office Number: [phone]  Patricia Pesa, M.D.  Vilinda Boehringer, M.D.  Merton Border, M.D

## 2015-04-04 ENCOUNTER — Ambulatory Visit (HOSPITAL_COMMUNITY): Payer: PPO

## 2015-04-06 ENCOUNTER — Ambulatory Visit (HOSPITAL_COMMUNITY): Payer: PPO

## 2015-04-08 ENCOUNTER — Ambulatory Visit (HOSPITAL_COMMUNITY): Payer: PPO

## 2015-04-15 ENCOUNTER — Encounter: Payer: Self-pay | Admitting: Gynecology

## 2015-04-15 ENCOUNTER — Ambulatory Visit (INDEPENDENT_AMBULATORY_CARE_PROVIDER_SITE_OTHER): Payer: PPO | Admitting: Gynecology

## 2015-04-15 VITALS — BP 138/82 | Ht 63.0 in | Wt 173.0 lb

## 2015-04-15 DIAGNOSIS — C50911 Malignant neoplasm of unspecified site of right female breast: Secondary | ICD-10-CM | POA: Diagnosis not present

## 2015-04-15 DIAGNOSIS — Z01419 Encounter for gynecological examination (general) (routine) without abnormal findings: Secondary | ICD-10-CM | POA: Diagnosis not present

## 2015-04-15 DIAGNOSIS — E785 Hyperlipidemia, unspecified: Secondary | ICD-10-CM | POA: Diagnosis not present

## 2015-04-15 DIAGNOSIS — M810 Age-related osteoporosis without current pathological fracture: Secondary | ICD-10-CM | POA: Diagnosis not present

## 2015-04-15 DIAGNOSIS — N952 Postmenopausal atrophic vaginitis: Secondary | ICD-10-CM

## 2015-04-15 DIAGNOSIS — I10 Essential (primary) hypertension: Secondary | ICD-10-CM | POA: Diagnosis not present

## 2015-04-15 DIAGNOSIS — R8279 Other abnormal findings on microbiological examination of urine: Secondary | ICD-10-CM | POA: Diagnosis not present

## 2015-04-15 DIAGNOSIS — N3281 Overactive bladder: Secondary | ICD-10-CM

## 2015-04-15 DIAGNOSIS — N318 Other neuromuscular dysfunction of bladder: Secondary | ICD-10-CM | POA: Diagnosis not present

## 2015-04-15 DIAGNOSIS — I480 Paroxysmal atrial fibrillation: Secondary | ICD-10-CM | POA: Diagnosis not present

## 2015-04-15 MED ORDER — SOLIFENACIN SUCCINATE 10 MG PO TABS: 10.0000 mg | ORAL_TABLET | Freq: Every day | ORAL | Status: DC

## 2015-04-15 NOTE — Progress Notes (Signed)
Wanda Green 01-Jul-1934 [id number]        81 y.o.  G3P3003  for annual exam.  Several issues noted below.  Past medical history,surgical history, problem list, medications, allergies, family history and social history were all reviewed and documented as reviewed in the EPIC chart.  ROS:  Performed with pertinent positives and negatives included in the history, assessment and plan.   Additional significant findings :  none   Exam: Leanne Lovely Vitals:   04/15/15 1054  BP: 138/82  Height: 5\' 3"  (1.6 m)  Weight: 173 lb (78.472 kg)   General appearance:  Normal affect, orientation and appearance. Skin: Grossly normal HEENT: Without gross lesions.  No cervical or supraclavicular adenopathy. Thyroid normal.  Lungs:  Clear without wheezing, rales or rhonchi Cardiac: RR, without RMG Abdominal:  Soft, nontender, without masses, guarding, rebound, organomegaly or hernia Breasts:  Examined lying and sitting without masses, retractions, discharge or axillary adenopathy.  Well-healed right lumpectomy scar Pelvic:  Ext/BUS/vagina with atrophic changes  Cervix with atrophic changes  Uterus anteverted, normal size, shape and contour, midline and mobile nontender   Adnexa without masses or tenderness    Anus and perineum normal   Rectovaginal normal sphincter tone without palpated masses or tenderness.    Assessment/Plan:  81 y.o. G3P3003 female for annual exam.   1. Postmenopausal/atrophic genital changes. No significant hot flushes, night sweats, vaginal dryness or any vaginal bleeding. Continue to monitor and report any issues or vaginal bleeding. 2. Detrusor instability. Is on [place] hand but does not feel that is working well. Wants to go back on Vesicare which worked well before. Vesicare 10 mg #30 with one year refill. Check urinalysis today. 3. History of right breast cancer. Doing well. Exam NED.  Mammography coming due in July. SBE monthly reviewed. 4. Osteoporosis.  DEXA 2014 T score -2.5 started Fosamax but had unacceptable GI side effects. Was to start Prolia but she decided against this. Reviewed increased risk of fracture. She is reluctant to consider treatment at this point. Recommend repeat bone density now and then we will further discuss treatment options. Patient agrees to schedule. 5. Pap smear 2011. No Pap smear done today. History of cone biopsy age 49 with normal Pap smears since then. We both agree to stop screening based on current screening recommendations and age. 6. History of ischemic colitis. Colonoscopy 2016. Continue to follow up with gastroenterology. 7. Health maintenance. No routine blood work done as patient has this done elsewhere. Follow up 1 year, sooner as needed.   Anastasio Auerbach MD, 11:23 AM 04/15/2015

## 2015-04-15 NOTE — Patient Instructions (Signed)
Follow up for bone density as scheduled  Start on the [place]. Call me if you have any issues with this

## 2015-04-16 LAB — URINALYSIS W MICROSCOPIC + REFLEX CULTURE
BACTERIA UA: NONE SEEN [HPF]
BILIRUBIN URINE: NEGATIVE
Casts: NONE SEEN [LPF]
GLUCOSE, UA: NEGATIVE
HGB URINE DIPSTICK: NEGATIVE
Ketones, ur: NEGATIVE
Nitrite: NEGATIVE
PROTEIN: NEGATIVE
Specific Gravity, Urine: 1.019 (ref 1.001–1.035)
Yeast: NONE SEEN [HPF]
pH: 5.5 (ref 5.0–8.0)

## 2015-04-17 LAB — URINE CULTURE
COLONY COUNT: NO GROWTH
ORGANISM ID, BACTERIA: NO GROWTH

## 2015-05-13 DIAGNOSIS — H18411 Arcus senilis, right eye: Secondary | ICD-10-CM | POA: Diagnosis not present

## 2015-05-13 DIAGNOSIS — H02839 Dermatochalasis of unspecified eye, unspecified eyelid: Secondary | ICD-10-CM | POA: Diagnosis not present

## 2015-05-13 DIAGNOSIS — H2511 Age-related nuclear cataract, right eye: Secondary | ICD-10-CM | POA: Diagnosis not present

## 2015-05-13 DIAGNOSIS — H2512 Age-related nuclear cataract, left eye: Secondary | ICD-10-CM | POA: Diagnosis not present

## 2015-06-03 ENCOUNTER — Telehealth: Payer: Self-pay | Admitting: Internal Medicine

## 2015-06-03 DIAGNOSIS — G4733 Obstructive sleep apnea (adult) (pediatric): Secondary | ICD-10-CM

## 2015-06-03 NOTE — Telephone Encounter (Signed)
error 

## 2015-06-03 NOTE — Telephone Encounter (Signed)
Patient calling because she wants to switch her DME from [facility] to [facility].  She says that [facility] is closer to her home. She needs a new mask, her nasal pillows are not comfortable.   Order entered for change in DME and change in mask.  Requested download in 4 weeks after receiving new mask. Nothing further needed.

## 2015-06-23 DIAGNOSIS — G4733 Obstructive sleep apnea (adult) (pediatric): Secondary | ICD-10-CM | POA: Diagnosis not present

## 2015-06-24 DIAGNOSIS — D509 Iron deficiency anemia, unspecified: Secondary | ICD-10-CM | POA: Diagnosis not present

## 2015-07-01 DIAGNOSIS — I480 Paroxysmal atrial fibrillation: Secondary | ICD-10-CM | POA: Diagnosis not present

## 2015-07-01 DIAGNOSIS — I252 Old myocardial infarction: Secondary | ICD-10-CM | POA: Diagnosis not present

## 2015-07-01 DIAGNOSIS — E785 Hyperlipidemia, unspecified: Secondary | ICD-10-CM | POA: Diagnosis not present

## 2015-07-01 DIAGNOSIS — I10 Essential (primary) hypertension: Secondary | ICD-10-CM | POA: Diagnosis not present

## 2015-07-11 DIAGNOSIS — Z961 Presence of intraocular lens: Secondary | ICD-10-CM | POA: Diagnosis not present

## 2015-07-11 DIAGNOSIS — H2511 Age-related nuclear cataract, right eye: Secondary | ICD-10-CM | POA: Diagnosis not present

## 2015-07-11 DIAGNOSIS — H25811 Combined forms of age-related cataract, right eye: Secondary | ICD-10-CM | POA: Diagnosis not present

## 2015-07-12 DIAGNOSIS — H2512 Age-related nuclear cataract, left eye: Secondary | ICD-10-CM | POA: Diagnosis not present

## 2015-07-22 ENCOUNTER — Ambulatory Visit (HOSPITAL_COMMUNITY)
Admission: EM | Admit: 2015-07-22 | Discharge: 2015-07-22 | Disposition: A | Discharge status: Home / Self Care | Payer: PPO | Attending: Emergency Medicine | Admitting: Emergency Medicine

## 2015-07-22 ENCOUNTER — Encounter (HOSPITAL_COMMUNITY): Payer: Self-pay | Admitting: Emergency Medicine

## 2015-07-22 DIAGNOSIS — T63441A Toxic effect of venom of bees, accidental (unintentional), initial encounter: Secondary | ICD-10-CM

## 2015-07-22 DIAGNOSIS — IMO0001 Reserved for inherently not codable concepts without codable children: Secondary | ICD-10-CM

## 2015-07-22 DIAGNOSIS — M7989 Other specified soft tissue disorders: Secondary | ICD-10-CM

## 2015-07-22 MED ORDER — METHYLPREDNISOLONE SODIUM SUCC 125 MG IJ SOLR
125.0000 mg | Freq: Once | INTRAMUSCULAR | Status: AC
  Administered 2015-07-22: 125 mg via INTRAMUSCULAR

## 2015-07-22 MED ORDER — METHYLPREDNISOLONE SODIUM SUCC 125 MG IJ SOLR
INTRAMUSCULAR | Status: AC
  Filled 2015-07-22: qty 2

## 2015-07-22 MED ORDER — PREDNISONE 10 MG PO TABS: ORAL_TABLET | ORAL | Status: DC

## 2015-07-22 NOTE — ED Notes (Signed)
Patient reports being stung by 2 wasp today around 10 am.  Patient has used ice pack to these areas.  Patient reports left upper back and right hand as location of insect/wasp stings.  Areas are red, swollen, warm

## 2015-07-22 NOTE — Discharge Instructions (Signed)
Bee, Wasp, or [employer], wasps, and hornets are part of a family of insects that can sting people. These stings can cause pain and inflammation, but they are usually not serious. However, some people may have an allergic reaction to a sting. This can cause the symptoms to be more severe.  SYMPTOMS  Common symptoms of this condition include:   A red lump in the skin that sometimes has a tiny hole in the center. In some cases, a stinger may be in the center of the wound.  Pain and itching at the sting site.  Redness and swelling around the sting site. If you have an allergic reaction (localized allergic reaction), the swelling and redness may spread out from the sting site. In some cases, this reaction can continue to develop over the next 12-36 hours. In rare cases, a person may have a severe allergic reaction (anaphylactic reaction) to a sting. Symptoms of an anaphylactic reaction may include:   Wheezing or difficulty breathing.  Raised, itchy, red patches on the skin.  Nausea or vomiting.  Abdominal cramping.  Diarrhea.  Chest pain.  Fainting.  Redness of the face (flushing). DIAGNOSIS  This condition is usually diagnosed based on symptoms, medical history, and a physical exam. TREATMENT  Most stings can be treated with:   Icing to reduce swelling.  Medicines (antihistamines) to treat itching or an allergic reaction.  Medicines to help reduce pain. These may be medicines that you take by mouth, or medicated creams or lotions that you apply to your skin. If you were stung by a bee, the stinger and a small sac of poison may be in the wound. This may be removed by brushing across it with a flat card, such as a credit card. Another method is to pinch the area and pull it out. These methods can help reduce the severity of the body's reaction to the sting.  HOME CARE INSTRUCTIONS   Wash the sting site daily with soap and water as told by your health care provider.  Apply  or take over-the-counter and prescription medicines only as told by your health care provider.  If directed, apply ice to the sting area.  Put ice in a plastic bag.  Place a towel between your skin and the bag.  Leave the ice on for 20 minutes, 2-3 times per day.  Do not scratch the sting area.  To lessen pain, try using a paste that is made of water and baking soda. Rub the paste on the sting area and leave it on for 5 minutes.  If you had a severe allergic reaction to a sting, you may need:  To wear a medical bracelet or necklace that lists the allergy.  To learn when and how to use an anaphylaxis kit or epinephrine injection. Your family members may also need to learn this.  To carry an anaphylaxis kit with you at all times. SEEK MEDICAL CARE IF:   Your symptoms do not get better in 2-3 days.  You have redness, swelling, or pain that spreads beyond the area of the sting.  You have a fever. SEEK IMMEDIATE MEDICAL CARE IF:  You have symptoms of a severe allergic reaction. These include:   Wheezing or difficulty breathing.  Chest pain.  Light-headedness or fainting.  Itchy, raised, red patches on the skin.  Nausea or vomiting.  Abdominal cramping.  Diarrhea.   This information is not intended to replace advice given to you by your health care provider.  Make sure you discuss any questions you have with your health care provider. °  °Document Released: 02/19/2005 Document Revised: 11/10/2014 Document Reviewed: 07/07/2014 °Elsevier Interactive Patient Education ©2016 Elsevier Inc[employer] ° °

## 2015-07-22 NOTE — ED Provider Notes (Signed)
CSN: [id number]     Arrival date & time 07/22/15  1457 History   First MD Initiated Contact with Patient 07/22/15 1656     Chief Complaint  Patient presents with  . Insect Bite   (Consider location/radiation/quality/duration/timing/severity/associated sxs/prior Treatment) HPI History obtained from patient:  Pt presents with the cc of: Right hand swelling secondary to bee sting Duration of symptoms: 7:30 this morning Treatment prior to arrival: Cold compresses and elevation Context: Outside and garden and was stung by a wasp Other symptoms include: Swelling and redness and pain Pain score: 4 FAMILY HISTORY: Mother-hypertension    Past Medical History  Diagnosis Date  . Breast cancer ([place])   . Hypertension   . Osteoporosis 06/2012    T score -2.5  . Colitis, ischemic ([place])   . Elevated cholesterol   . Cervical dysplasia age 88  . Arthritis   . Sleep apnea   . Heart attack [name])    Past Surgical History  Procedure Laterality Date  . Bi lateral knee scope    . Breast surgery  2000    RIGHT BREAST LUMPECTOMY  . Tubal ligation    . Colposcopy    . Cervical cone biopsy  age 71  . Nasal sinus surgery  12/14  . Colonoscopy with propofol N/A 09/01/2014    Procedure: COLONOSCOPY WITH PROPOFOL;  Surgeon: Ronald Lobo, MD;  Location: [facility];  Service: Endoscopy;  Laterality: N/A;  this will be done unprepped  . Cardiac catheterization N/A 11/25/2014    Procedure: Left Heart Cath and Coronary Angiography;  Surgeon: Dixie Dials, MD;  Location: [facility];  Service: Cardiovascular;  Laterality: N/A;   Family History  Problem Relation Age of Onset  . Hypertension Mother   . Heart failure Mother   . Heart disease Mother   . Hypertension Father   . Heart disease Father   . Hypertension Sister   . Uterine cancer Sister   . Hypertension Brother    Social History  Substance Use Topics  . Smoking status: Former Smoker -- 0.10 packs/day for 3 years    Types:  Cigarettes    Quit date: 03/05/1972  . Smokeless tobacco: Never Used     Comment: ONE CIG A DAY  . Alcohol Use: No   OB History    Gravida Para Term Preterm AB TAB SAB Ectopic Multiple Living   3 3 3       3      Review of Systems  Denies: HEADACHE, NAUSEA, ABDOMINAL PAIN, CHEST PAIN, CONGESTION, DYSURIA, SHORTNESS OF BREATH  Allergies  Penicillins; Atorvastatin; Augmentin; Codeine; Doxycycline; Hydrocodone; and Oxycodone-acetaminophen  Home Medications   Prior to Admission medications   Medication Sig Start Date End Date Taking? Authorizing Provider  acetaminophen (TYLENOL) 500 MG tablet Take 500 mg by mouth every 6 (six) hours as needed for mild pain or moderate pain.    Historical Provider, MD  amLODipine (NORVASC) 2.5 MG tablet Take 2.5 mg by mouth at bedtime. 11/15/14   Historical Provider, MD  aspirin EC 81 MG EC tablet Take 1 tablet (81 mg total) by mouth daily. 11/26/14   Dixie Dials, MD  atorvastatin (LIPITOR) 20 MG tablet Take 20 mg by mouth every 3 (three) days.     Historical Provider, MD  cetirizine (ZYRTEC) 10 MG tablet Take 10 mg by mouth at bedtime.    Historical Provider, MD  losartan (COZAAR) 100 MG tablet Take 100 mg by mouth daily.    Historical Provider, MD  metoprolol succinate (TOPROL-XL) 50 MG 24 hr tablet Take 25-50 mg by mouth 2 (two) times daily. Take with or immediately following a meal. TAKES 25MG  IN AM AND 50MG  IN PM    Historical Provider, MD  omeprazole (PRILOSEC) 40 MG capsule Take 1 capsule (40 mg total) by mouth daily as needed. Patient taking differently: Take 40 mg by mouth daily as needed (for heartburn).  07/14/12   Leandrew Koyanagi, MD  oxybutynin (DITROPAN-XL) 10 MG 24 hr tablet TAKE 1 TABLET (10 MG TOTAL) BY MOUTH AT BEDTIME. 03/21/15   Anastasio Auerbach, MD  predniSONE (DELTASONE) 10 MG tablet Sig: 4 tables once a day for 3 days, 3 tablets once a day X3 days, 2 tablets a day for 3 days, 1 tablet a day for 3 days. 07/22/15   Konrad Felix, PA   solifenacin (VESICARE) 10 MG tablet Take 1 tablet (10 mg total) by mouth daily. 04/15/15   Anastasio Auerbach, MD   Meds Ordered and Administered this Visit   Medications  methylPREDNISolone sodium succinate (SOLU-MEDROL) 125 mg/2 mL injection 125 mg (125 mg Intramuscular Given 07/22/15 1724)    BP 164/74 mmHg  Pulse 73  Temp(Src) 98 F (36.7 C) (Oral)  Resp 16  SpO2 98%  LMP 07/22/2015 No data found.   Physical Exam NURSES NOTES AND VITAL SIGNS REVIEWED. CONSTITUTIONAL: Well developed, well nourished, no acute distress HEENT: normocephalic, atraumatic EYES: Conjunctiva normal NECK:normal ROM, supple, no adenopathy PULMONARY:No respiratory distress, normal effort ABDOMINAL: Soft, ND, NT BS+, No CVAT MUSCULOSKELETAL: Normal ROM of all extremities, right hand and wrist are edematous and red. Pitting edema, no stinger is present SKIN: warm and dry without rash PSYCHIATRIC: Mood and affect, behavior are normal  ED Course  Procedures (including critical care time)  Labs Review Labs Reviewed - No data to display  Imaging Review No results found.   Visual Acuity Review  Right Eye Distance:   Left Eye Distance:   Bilateral Distance:    Right Eye Near:   Left Eye Near:    Bilateral Near:      Prescription for prednisone taper   MDM   1. Hymenoptera reaction, accidental or unintentional, initial encounter     Patient is reassured that there are no issues that require transfer to higher level of care at this time or additional tests. Patient is advised to continue home symptomatic treatment. Patient is advised that if there are new or worsening symptoms to attend the emergency department, contact primary care provider, or return to UC. Instructions of care provided discharged home in stable condition.    THIS NOTE WAS GENERATED USING A VOICE RECOGNITION SOFTWARE PROGRAM. ALL REASONABLE EFFORTS  WERE MADE TO PROOFREAD THIS DOCUMENT FOR ACCURACY.  I have verbally  reviewed the discharge instructions with the patient. A printed AVS was given to the patient.  All questions were answered prior to discharge.      Konrad Felix, [place] 07/22/15 1750

## 2015-08-01 DIAGNOSIS — Z961 Presence of intraocular lens: Secondary | ICD-10-CM | POA: Diagnosis not present

## 2015-08-01 DIAGNOSIS — H5211 Myopia, right eye: Secondary | ICD-10-CM | POA: Diagnosis not present

## 2015-08-01 DIAGNOSIS — Z9849 Cataract extraction status, unspecified eye: Secondary | ICD-10-CM | POA: Diagnosis not present

## 2015-08-01 DIAGNOSIS — H2512 Age-related nuclear cataract, left eye: Secondary | ICD-10-CM | POA: Diagnosis not present

## 2015-08-01 DIAGNOSIS — H25812 Combined forms of age-related cataract, left eye: Secondary | ICD-10-CM | POA: Diagnosis not present

## 2015-09-09 ENCOUNTER — Other Ambulatory Visit: Payer: Self-pay | Admitting: Oncology

## 2015-09-09 DIAGNOSIS — C50911 Malignant neoplasm of unspecified site of right female breast: Secondary | ICD-10-CM

## 2015-09-12 ENCOUNTER — Other Ambulatory Visit (INDEPENDENT_AMBULATORY_CARE_PROVIDER_SITE_OTHER): Payer: PPO

## 2015-09-12 DIAGNOSIS — C50911 Malignant neoplasm of unspecified site of right female breast: Secondary | ICD-10-CM | POA: Diagnosis not present

## 2015-09-13 LAB — CBC WITH DIFFERENTIAL/PLATELET
BASOS ABS: 0 10*3/uL (ref 0.0–0.2)
BASOS: 0 %
EOS (ABSOLUTE): 0.1 10*3/uL (ref 0.0–0.4)
Eos: 2 %
Hematocrit: 33.1 % — ABNORMAL LOW (ref 34.0–46.6)
Hemoglobin: 11 g/dL — ABNORMAL LOW (ref 11.1–15.9)
IMMATURE GRANS (ABS): 0 10*3/uL (ref 0.0–0.1)
Immature Granulocytes: 0 %
LYMPHS: 25 %
Lymphocytes Absolute: 1.4 10*3/uL (ref 0.7–3.1)
MCH: 30.6 pg (ref 26.6–33.0)
MCHC: 33.2 g/dL (ref 31.5–35.7)
MCV: 92 fL (ref 79–97)
MONOS ABS: 0.6 10*3/uL (ref 0.1–0.9)
Monocytes: 10 %
NEUTROS ABS: 3.5 10*3/uL (ref 1.4–7.0)
NEUTROS PCT: 63 %
PLATELETS: 227 10*3/uL (ref 150–379)
RBC: 3.6 x10E6/uL — ABNORMAL LOW (ref 3.77–5.28)
RDW: 13.5 % (ref 12.3–15.4)
WBC: 5.7 10*3/uL (ref 3.4–10.8)

## 2015-09-13 LAB — CMP14 + ANION GAP
A/G RATIO: 1.5 (ref 1.2–2.2)
ALK PHOS: 55 IU/L (ref 39–117)
ALT: 13 IU/L (ref 0–32)
AST: 19 IU/L (ref 0–40)
Albumin: 4.3 g/dL (ref 3.5–4.8)
Anion Gap: 19 mmol/L — ABNORMAL HIGH (ref 10.0–18.0)
BILIRUBIN TOTAL: 0.4 mg/dL (ref 0.0–1.2)
BUN/Creatinine Ratio: 19 (ref 12–28)
BUN: 25 mg/dL (ref 8–27)
CALCIUM: 9.3 mg/dL (ref 8.7–10.3)
CHLORIDE: 100 mmol/L (ref 96–106)
CO2: 21 mmol/L (ref 18–29)
Creatinine, Ser: 1.31 mg/dL — ABNORMAL HIGH (ref 0.57–1.00)
GFR calc Af Amer: 45 mL/min/{1.73_m2} — ABNORMAL LOW (ref >59)
GFR, EST NON AFRICAN AMERICAN: 39 mL/min/{1.73_m2} — AB (ref >59)
GLOBULIN, TOTAL: 2.9 g/dL (ref 1.5–4.5)
GLUCOSE: 103 mg/dL — AB (ref 65–99)
POTASSIUM: 4.4 mmol/L (ref 3.5–5.2)
SODIUM: 140 mmol/L (ref 134–144)
Total Protein: 7.2 g/dL (ref 6.0–8.5)

## 2015-09-14 ENCOUNTER — Telehealth: Payer: Self-pay | Admitting: *Deleted

## 2015-09-14 NOTE — Telephone Encounter (Signed)
Pt called / informed " lab OK at her baseline Mild anemia but a little better than last time" per Dr Beryle Beams. Informed Hgb 11.0. Stated she will see [place] at her appt.

## 2015-09-14 NOTE — Telephone Encounter (Signed)
-----   Message from Annia Belt, MD sent at 09/13/2015  9:49 AM EDT ----- Call pt lab OK at her baseline Mild anemia but a little better than last time

## 2015-09-19 ENCOUNTER — Other Ambulatory Visit: Payer: PPO

## 2015-09-26 ENCOUNTER — Ambulatory Visit: Payer: PPO | Admitting: Oncology

## 2015-09-27 ENCOUNTER — Encounter: Payer: Self-pay | Admitting: Oncology

## 2015-09-27 ENCOUNTER — Ambulatory Visit (INDEPENDENT_AMBULATORY_CARE_PROVIDER_SITE_OTHER): Payer: PPO | Admitting: Oncology

## 2015-09-27 VITALS — BP 155/77 | HR 62 | Temp 98.2°F | Ht 63.5 in | Wt 179.5 lb

## 2015-09-27 DIAGNOSIS — D6489 Other specified anemias: Secondary | ICD-10-CM

## 2015-09-27 DIAGNOSIS — K529 Noninfective gastroenteritis and colitis, unspecified: Secondary | ICD-10-CM

## 2015-09-27 DIAGNOSIS — C50919 Malignant neoplasm of unspecified site of unspecified female breast: Secondary | ICD-10-CM | POA: Diagnosis not present

## 2015-09-27 DIAGNOSIS — Z170 Estrogen receptor positive status [ER+]: Secondary | ICD-10-CM | POA: Diagnosis not present

## 2015-09-27 DIAGNOSIS — I4891 Unspecified atrial fibrillation: Secondary | ICD-10-CM | POA: Diagnosis not present

## 2015-09-27 DIAGNOSIS — I10 Essential (primary) hypertension: Secondary | ICD-10-CM

## 2015-09-27 DIAGNOSIS — C50911 Malignant neoplasm of unspecified site of right female breast: Secondary | ICD-10-CM

## 2015-09-27 NOTE — Patient Instructions (Signed)
Schedule mammogram with [facility] & have them send me a copy of the report Return 1 year Lab 1 week before visit

## 2015-09-27 NOTE — Progress Notes (Signed)
Hematology and Oncology Follow Up Visit  Wanda Green [id number] [birth year] 81 y.o. 09/27/2015 5:08 PM   Principle Diagnosis: Encounter Diagnosis  Name Primary?  . Breast cancer, stage 2, right [facility]) Yes  Clinical Summary: 81 year old woman with a history of stage II, 2-node positive, ER positive, cancer of the right breast initially diagnosed in January 2000, treated with lumpectomy, breast radiation, four cycles of Adriamycin, Cytoxan and Taxotere chemotherapy, five years of tamoxifen, and one year of Arimidex.  Interim History:  Since her visit with me last July, she was hospitalized on September 21 with crushing chest pain. She had a non-ST elevation MI with a peak troponin level of 2.24 recorded on September 22. Cardiac catheterization done on September 22 showed that all major coronary arteries were patent. Medical treatment was continued. She has had no subsequent events. She was presented to the emergency department again on September 27 with a recurrent episode of ischemic colitis presenting with sudden onset of left lower quadrant abdominal pain and hematochezia.  She was observed in the emergency department and appeared to be stable. Hemoglobin and her baseline. She was discharged on Cipro plus Flagyl and advised to call her gastroenterologist. She has had no subsequent flareup since that time.  She missed her mammogram appointment which was scheduled for March. She will reschedule. No new lumps noted.  Her family is doing well. Her oldest daughter is a Therapist, music in [place]. Youngest daughter age 34, single and still at home. Son is doing well.  Medications: reviewed  Allergies:  Allergies  Allergen Reactions  . Penicillins Nausea And Vomiting and Other (See Comments)    Caused bleeding also  . Atorvastatin Other (See Comments)    Muscle and leg myalgias  . Augmentin [Amoxicillin-Pot Clavulanate] Other (See Comments)    Mouth pain  . Codeine Nausea And Vomiting   . Doxycycline Nausea And Vomiting  . Hydrocodone Nausea And Vomiting  . Oxycodone-Acetaminophen Nausea And Vomiting    Review of Systems: No recurrent chest pain. No recurrent abdominal pain. No hematochezia or melena. Remaining ROS negative:   Physical Exam: Blood pressure (!) 155/77, pulse 62, temperature 98.2 F (36.8 C), temperature source Oral, height 5' 3.5" (1.613 m), weight 179 lb 8 oz (81.4 kg), last menstrual period 07/22/2015, SpO2 98 %. Wt Readings from Last 3 Encounters:  09/27/15 179 lb 8 oz (81.4 kg)  04/15/15 173 lb (78.5 kg)  04/01/15 178 lb (80.7 kg)     General appearance: Well-nourished Caucasian woman HENNT: Pharynx no erythema, exudate, mass, or ulcer. No thyromegaly or thyroid nodules Lymph nodes: No cervical, supraclavicular, or axillary lymphadenopathy Breasts: Surgical changes outer quadrant right breast with scarring and some skin retraction, no dominant mass in either breast Lungs: Clear to auscultation, resonant to percussion throughout Heart: Regular rhythm, no murmur, no gallop, no rub, no click, no edema Abdomen: Soft, nontender, normal bowel sounds, no mass, no organomegaly Extremities: No edema, no calf tenderness Musculoskeletal: no joint deformities GU:  Vascular: Carotid pulses 2+, no bruits,  Neurologic: Alert, oriented, PERRLA,  cranial nerves grossly normal, motor strength 5 over 5, reflexes 1+ symmetric, upper body coordination normal, gait normal, Skin: No rash or ecchymosis  Lab Results: CBC W/Diff    Component Value Date/Time   WBC 5.7 09/12/2015 1147   WBC 14.2 (H) 11/30/2014 0123   RBC 3.60 (L) 09/12/2015 1147   RBC 3.47 (L) 11/30/2014 0123   HGB 10.5 (L) 11/30/2014 0123   HGB 11.0 (L) 06/10/2012 1327  HCT 33.1 (L) 09/12/2015 1147   HCT 33.8 (L) 06/10/2012 1327   PLT 227 09/12/2015 1147   MCV 92 09/12/2015 1147   MCV 93.9 06/10/2012 1327   MCH 30.6 09/12/2015 1147   MCH 30.3 11/30/2014 0123   MCHC 33.2 09/12/2015 1147    MCHC 32.6 11/30/2014 0123   RDW 13.5 09/12/2015 1147   RDW 13.3 06/10/2012 1327   LYMPHSABS 1.4 09/12/2015 1147   LYMPHSABS 1.6 06/10/2012 1327   MONOABS 1.5 (H) 08/31/2014 1058   MONOABS 0.5 06/10/2012 1327   EOSABS 0.1 09/12/2015 1147   BASOSABS 0.0 09/12/2015 1147   BASOSABS 0.0 06/10/2012 1327     Chemistry      Component Value Date/Time   NA 140 09/12/2015 1147   NA 140 06/03/2012 1139   K 4.4 09/12/2015 1147   K 4.7 06/03/2012 1139   CL 100 09/12/2015 1147   CL 105 06/03/2012 1139   CO2 21 09/12/2015 1147   CO2 25 06/03/2012 1139   BUN 25 09/12/2015 1147   BUN 28.0 (H) 06/03/2012 1139   CREATININE 1.31 (H) 09/12/2015 1147   CREATININE 1.19 (H) 06/22/2013 1005   CREATININE 1.5 (H) 06/03/2012 1139      Component Value Date/Time   CALCIUM 9.3 09/12/2015 1147   CALCIUM 9.4 06/03/2012 1139   ALKPHOS 55 09/12/2015 1147   ALKPHOS 70 06/03/2012 1139   AST 19 09/12/2015 1147   AST 15 06/03/2012 1139   ALT 13 09/12/2015 1147   ALT 14 06/03/2012 1139   BILITOT 0.4 09/12/2015 1147   BILITOT 0.32 06/03/2012 1139       Radiological Studies: No results found.  Impression:  #1. Stage II ER positive breast cancer treated as outlined above No evidence for new disease now out over 17 years from diagnosis and is hopefully cured. She will continue annual mammograms and exam. This year's mammogram is overdue and she will call to schedule.  #2. Mild chronic normochromic anemia Serum immunoglobulins normal when checked in 2015 and no monoclonal proteins on IFE. Hemoglobin currently stable and at her baseline at 11 g with MCV 92 on 09/12/2015.  #3. History of recurrent colitis, etiology not clear. Not related to diverticulosis. No known coronary artery disease.No symptoms to suggest intestinal angina.  #4.  atrial fibrillation controlled on medication. Diagnosed 2014. Not on chronic anticoagulation secondary to her perceived intolerance to these medications with atypical  pain and muscle cramps. She continues follow-up with her cardiologist Dr. Terrence Dupont. Most recent cardiogram on file from September 2016 admission showed sinus rhythm.  #5. Essential hypertension  #6. Probable non-ST wave elevation MI with normal large vessel coronary arteries at time of catheterization 11/25/2014. I wonder if we are seeing small vessel disease related to previous breast radiation?  CC: Patient Care Team: Charolette Forward, MD as PCP - General (Cardiology) Annia Belt, MD as Consulting Physician (Oncology)   Annia Belt, MD 7/25/20175:08 PM

## 2015-09-30 DIAGNOSIS — I480 Paroxysmal atrial fibrillation: Secondary | ICD-10-CM | POA: Diagnosis not present

## 2015-09-30 DIAGNOSIS — I10 Essential (primary) hypertension: Secondary | ICD-10-CM | POA: Diagnosis not present

## 2015-09-30 DIAGNOSIS — I252 Old myocardial infarction: Secondary | ICD-10-CM | POA: Diagnosis not present

## 2015-09-30 DIAGNOSIS — D649 Anemia, unspecified: Secondary | ICD-10-CM | POA: Diagnosis not present

## 2015-10-17 DIAGNOSIS — J329 Chronic sinusitis, unspecified: Secondary | ICD-10-CM | POA: Diagnosis not present

## 2015-10-17 DIAGNOSIS — J309 Allergic rhinitis, unspecified: Secondary | ICD-10-CM | POA: Diagnosis not present

## 2015-10-17 DIAGNOSIS — J310 Chronic rhinitis: Secondary | ICD-10-CM | POA: Diagnosis not present

## 2015-10-24 DIAGNOSIS — G4733 Obstructive sleep apnea (adult) (pediatric): Secondary | ICD-10-CM | POA: Diagnosis not present

## 2015-11-18 ENCOUNTER — Emergency Department (HOSPITAL_COMMUNITY)
Admission: EM | Admit: 2015-11-18 | Discharge: 2015-11-18 | Disposition: A | Discharge status: Home / Self Care | Payer: PPO | Attending: Emergency Medicine | Admitting: Emergency Medicine

## 2015-11-18 ENCOUNTER — Emergency Department (HOSPITAL_COMMUNITY): Payer: PPO

## 2015-11-18 DIAGNOSIS — R079 Chest pain, unspecified: Secondary | ICD-10-CM

## 2015-11-18 DIAGNOSIS — Z79899 Other long term (current) drug therapy: Secondary | ICD-10-CM | POA: Insufficient documentation

## 2015-11-18 DIAGNOSIS — Z7982 Long term (current) use of aspirin: Secondary | ICD-10-CM | POA: Diagnosis not present

## 2015-11-18 DIAGNOSIS — Z853 Personal history of malignant neoplasm of breast: Secondary | ICD-10-CM | POA: Insufficient documentation

## 2015-11-18 DIAGNOSIS — Z87891 Personal history of nicotine dependence: Secondary | ICD-10-CM | POA: Diagnosis not present

## 2015-11-18 DIAGNOSIS — I10 Essential (primary) hypertension: Secondary | ICD-10-CM | POA: Insufficient documentation

## 2015-11-18 DIAGNOSIS — R0789 Other chest pain: Secondary | ICD-10-CM | POA: Insufficient documentation

## 2015-11-18 DIAGNOSIS — R1111 Vomiting without nausea: Secondary | ICD-10-CM | POA: Diagnosis not present

## 2015-11-18 LAB — COMPREHENSIVE METABOLIC PANEL
ALBUMIN: 4.3 g/dL (ref 3.5–5.0)
ALT: 20 U/L (ref 14–54)
AST: 20 U/L (ref 15–41)
Alkaline Phosphatase: 44 U/L (ref 38–126)
Anion gap: 8 (ref 5–15)
BUN: 27 mg/dL — AB (ref 6–20)
CHLORIDE: 107 mmol/L (ref 101–111)
CO2: 25 mmol/L (ref 22–32)
CREATININE: 1.46 mg/dL — AB (ref 0.44–1.00)
Calcium: 9.6 mg/dL (ref 8.9–10.3)
GFR calc Af Amer: 39 mL/min — ABNORMAL LOW (ref >60)
GFR calc non Af Amer: 33 mL/min — ABNORMAL LOW (ref >60)
GLUCOSE: 112 mg/dL — AB (ref 65–99)
Potassium: 4.6 mmol/L (ref 3.5–5.1)
SODIUM: 140 mmol/L (ref 135–145)
Total Bilirubin: 0.6 mg/dL (ref 0.3–1.2)
Total Protein: 7.5 g/dL (ref 6.5–8.1)

## 2015-11-18 LAB — CBC WITH DIFFERENTIAL/PLATELET
BASOS ABS: 0 10*3/uL (ref 0.0–0.1)
Basophils Relative: 0 %
EOS ABS: 0.1 10*3/uL (ref 0.0–0.7)
EOS PCT: 1 %
HCT: 36.9 % (ref 36.0–46.0)
Hemoglobin: 11.7 g/dL — ABNORMAL LOW (ref 12.0–15.0)
LYMPHS PCT: 13 %
Lymphs Abs: 0.9 10*3/uL (ref 0.7–4.0)
MCH: 30.4 pg (ref 26.0–34.0)
MCHC: 31.7 g/dL (ref 30.0–36.0)
MCV: 95.8 fL (ref 78.0–100.0)
Monocytes Absolute: 0.6 10*3/uL (ref 0.1–1.0)
Monocytes Relative: 8 %
Neutro Abs: 5.7 10*3/uL (ref 1.7–7.7)
Neutrophils Relative %: 78 %
PLATELETS: 200 10*3/uL (ref 150–400)
RBC: 3.85 MIL/uL — AB (ref 3.87–5.11)
RDW: 13.3 % (ref 11.5–15.5)
WBC: 7.2 10*3/uL (ref 4.0–10.5)

## 2015-11-18 LAB — I-STAT TROPONIN, ED
Troponin i, poc: 0 ng/mL (ref 0.00–0.08)
Troponin i, poc: 0 ng/mL (ref 0.00–0.08)

## 2015-11-18 NOTE — ED Provider Notes (Signed)
[facility] DEPT Provider Note   CSN: [id number] Arrival date & time: 11/18/15  1429   History   Chief Complaint Chief Complaint  Patient presents with  . Chest Pain    HPI Wanda Green is a 81 y.o. female.  The history is provided by the patient, a relative and medical records.    81 year old female with history of questionable ACS/MI in past (with negative LHC, but elevated troponins), ischemic colitis, breast ca, AFib, OSA, HTN, HLD presenting with chest pain. Onset about 30 minutes prior to arrival. Context- was sitting on tractor outside when sx started. Located in center of chest. Described as pressure. Severe. Radiating through to back and to jaw. Associated with diaphoresis, nausea, vomiting, shortness of breath. Patient states this is similar to her prior MI 1 year ago. Alleviated greatly by ASA and zofran given by EMS en route. Denies recent fevers, abdominal pain, diarrhea, leg swelling, hx DVT/PE.     Past Medical History:  Diagnosis Date  . Arthritis   . Breast cancer ([place])   . Cervical dysplasia age 3  . Colitis, ischemic ([place])   . Elevated cholesterol   . Heart attack ([place])   . Hypertension   . Osteoporosis 06/2012   T score -2.5  . Sleep apnea     Patient Active Problem List   Diagnosis Date Noted  . Acute coronary syndrome ([place]) 11/24/2014  . Colitis 08/31/2014  . Acute colitis 08/31/2014  . OSA (obstructive sleep apnea) 08/12/2012  . Atrial fibrillation ([place]) 06/24/2012  . HTN (hypertension) 06/24/2012  . Other and unspecified hyperlipidemia 06/24/2012  . Allergic rhinitis due to pollen 06/24/2012  . Breast cancer, stage 2 ([place]) 06/05/2011    Past Surgical History:  Procedure Laterality Date  . BI LATERAL KNEE SCOPE    . BREAST SURGERY  2000   RIGHT BREAST LUMPECTOMY  . CARDIAC CATHETERIZATION N/A 11/25/2014   Procedure: Left Heart Cath and Coronary Angiography;  Surgeon: Dixie Dials, MD;  Location: [facility];  Service:  Cardiovascular;  Laterality: N/A;  . CERVICAL CONE BIOPSY  age 18  . COLONOSCOPY WITH PROPOFOL N/A 09/01/2014   Procedure: COLONOSCOPY WITH PROPOFOL;  Surgeon: Ronald Lobo, MD;  Location: [facility];  Service: Endoscopy;  Laterality: N/A;  this will be done unprepped  . COLPOSCOPY    . NASAL SINUS SURGERY  12/14  . TUBAL LIGATION      OB History    Gravida Para Term Preterm AB Living   3 3 3     3    SAB TAB Ectopic Multiple Live Births                   Home Medications    Prior to Admission medications   Medication Sig Start Date End Date Taking? Authorizing Provider  acetaminophen (TYLENOL) 500 MG tablet Take 500 mg by mouth every 6 (six) hours as needed for mild pain or moderate pain.    Historical Provider, MD  amLODipine (NORVASC) 2.5 MG tablet Take 2.5 mg by mouth at bedtime. 11/15/14   Historical Provider, MD  amLODipine (NORVASC) 2.5 MG tablet Take 2.5 mg by mouth. 07/07/14   Historical Provider, MD  aspirin EC 81 MG EC tablet Take 1 tablet (81 mg total) by mouth daily. 11/26/14   Dixie Dials, MD  atorvastatin (LIPITOR) 20 MG tablet Take 20 mg by mouth every 3 (three) days.     Historical Provider, MD  atorvastatin (LIPITOR) 20 MG tablet Take 20 mg  by mouth.    Historical Provider, MD  cetirizine (ZYRTEC) 10 MG tablet Take 10 mg by mouth at bedtime.    Historical Provider, MD  cetirizine (ZYRTEC) 10 MG tablet Take 10 mg by mouth.    Historical Provider, MD  guaiFENesin (MUCINEX) 600 MG 12 hr tablet Take 1,200 mg by mouth.    Historical Provider, MD  losartan (COZAAR) 100 MG tablet Take 100 mg by mouth daily.    Historical Provider, MD  losartan (COZAAR) 100 MG tablet Take 100 mg by mouth.    Historical Provider, MD  metoprolol succinate (TOPROL-XL) 50 MG 24 hr tablet Take 25-50 mg by mouth 2 (two) times daily. Take with or immediately following a meal. TAKES 25MG  IN AM AND 50MG  IN PM    Historical Provider, MD  metoprolol succinate (TOPROL-XL) 50 MG 24 hr tablet  08/03/13    Historical Provider, MD  omeprazole (PRILOSEC) 40 MG capsule Take 1 capsule (40 mg total) by mouth daily as needed. Patient taking differently: Take 40 mg by mouth daily as needed (for heartburn).  07/14/12   Leandrew Koyanagi, MD  oxybutynin (DITROPAN-XL) 10 MG 24 hr tablet TAKE 1 TABLET (10 MG TOTAL) BY MOUTH AT BEDTIME. 03/21/15   Anastasio Auerbach, MD  predniSONE (DELTASONE) 10 MG tablet Sig: 4 tables once a day for 3 days, 3 tablets once a day X3 days, 2 tablets a day for 3 days, 1 tablet a day for 3 days. 07/22/15   Konrad Felix, PA  solifenacin (VESICARE) 10 MG tablet Take 1 tablet (10 mg total) by mouth daily. 04/15/15   Anastasio Auerbach, MD    Family History Family History  Problem Relation Age of Onset  . Hypertension Mother   . Heart failure Mother   . Heart disease Mother   . Hypertension Father   . Heart disease Father   . Hypertension Sister   . Uterine cancer Sister   . Hypertension Brother     Social History Social History  Substance Use Topics  . Smoking status: Former Smoker    Packs/day: 0.10    Years: 3.00    Types: Cigarettes    Quit date: 03/05/1972  . Smokeless tobacco: Never Used     Comment: ONE CIG A DAY  . Alcohol use No     Allergies   Penicillins; Atorvastatin; Augmentin [amoxicillin-pot clavulanate]; Codeine; Doxycycline; Hydrocodone; and Oxycodone-acetaminophen   Review of Systems Review of Systems  Constitutional: Positive for diaphoresis. Negative for fever.  Respiratory: Positive for shortness of breath.   Cardiovascular: Positive for chest pain. Negative for palpitations and leg swelling.  Gastrointestinal: Positive for nausea and vomiting. Negative for abdominal pain, blood in stool and diarrhea.  Skin: Negative for rash.  Allergic/Immunologic: Negative for immunocompromised state.  Hematological: Does not bruise/bleed easily.  All other systems reviewed and are negative.   Physical Exam Updated Vital Signs BP 133/65 (BP  Location: Right Arm)   Pulse 63   Temp 98.7 F (37.1 C) (Oral)   Resp 18   Ht 5\' 3"  (1.6 m)   Wt 79.4 kg   LMP 07/22/2015   SpO2 100%   BMI 31.00 kg/m   Physical Exam  Constitutional: She appears well-developed and well-nourished. No distress.  HENT:  Head: Normocephalic and atraumatic.  Eyes: Conjunctivae are normal.  Neck: Neck supple.  Cardiovascular: Normal rate and regular rhythm.   Murmur heard. Pulmonary/Chest: Effort normal and breath sounds normal. No respiratory distress. She exhibits no tenderness.  Abdominal: Soft. There is no tenderness.  Musculoskeletal: She exhibits no edema.  Neurological: She is alert.  Skin: Skin is warm and dry.  Psychiatric: She has a normal mood and affect.  Nursing note and vitals reviewed.   ED Treatments / Results  Labs (all labs ordered are listed, but only abnormal results are displayed) Labs Reviewed  CBC WITH DIFFERENTIAL/PLATELET - Abnormal; Notable for the following:       Result Value   RBC 3.85 (*)    Hemoglobin 11.7 (*)    All other components within normal limits  COMPREHENSIVE METABOLIC PANEL - Abnormal; Notable for the following:    Glucose, Bld 112 (*)    BUN 27 (*)    Creatinine, Ser 1.46 (*)    GFR calc non Af Amer 33 (*)    GFR calc Af Amer 39 (*)    All other components within normal limits  I-STAT TROPOININ, ED    EKG  EKG Interpretation  Date/Time:  Friday November 18 2015 14:32:48 EDT Ventricular Rate:  76 PR Interval:    QRS Duration: 112 QT Interval:  425 QTC Calculation: 478 R Axis:   58 Text Interpretation:  Sinus rhythm Borderline intraventricular conduction delay Abnormal R-wave progression, early transition No significant change since last tracing Confirmed by [facility] MD, Corene Cornea [phone]) on 11/18/2015 3:00:49 PM       Radiology Dg Chest 2 View  Result Date: 11/18/2015 CLINICAL DATA:  81 year old presenting with acute onset of chest pain radiating to the jaw and shortness of breath which  began earlier today, similar symptoms to her prior MI in September, 2016. Current history of hypertension. EXAM: CHEST  2 VIEW COMPARISON:  11/24/2014, 04/27/2006. FINDINGS: Cardiac silhouette normal in size, unchanged. Thoracic aorta mildly atherosclerotic, unchanged. Hilar and mediastinal contours otherwise unremarkable. Mildly prominent bronchovascular markings diffusely and mild central peribronchial thickening, unchanged. Lungs otherwise clear. No localized airspace consolidation. No pleural effusions. No pneumothorax. Normal pulmonary vascularity. Degenerative changes involving the thoracic and upper lumbar spine. Surgical clips in the right breast and right axilla from prior lumpectomy and node dissection. IMPRESSION: 1. No acute cardiopulmonary disease. Stable mild changes of chronic bronchitis and/or asthma. 2. Mild thoracic aortic atherosclerosis. Electronically Signed   By: Evangeline Dakin M.D.   On: 11/18/2015 15:44    Procedures Procedures (including critical care time)  Medications Ordered in ED Medications - No data to display   Initial Impression / Assessment and Plan / ED Course  I have reviewed the triage vital signs and the nursing notes.  Pertinent labs & imaging results that were available during my care of the patient were reviewed by me and considered in my medical decision making (see chart for details).  Clinical Course    81 year old female with history of questionable ACS/MI in past (with negative LHC, but elevated troponins), ischemic colitis, breast ca, AFib, OSA, HTN, HLD presenting with chest pain as above. AF, VSS upon arrival with minimal sx upon arrival after ASA and zofran given by EMS. EKG with findings similar to prior, without any acute ischemic changes today. Trop neg x1. Sx remain well controlled.   Given recent [place] 1 year ago with completely negative coronaries, called and discussed with Dr. Terrence Dupont, pt's cardiologist. Will plan for serial troponins and  EKG and if they remain negative with controlled sx, will discharge with plan to f/u in clinic.   Care transferred to Dr. Darl Householder at 16:00 pending repeat EKG and trop and re-eval for dispo.   Case  discussed with Dr. Dayna Barker, who oversaw management of this patient.    Final Clinical Impressions(s) / ED Diagnoses   Final diagnoses:  Chest pain, unspecified chest pain type    New Prescriptions New Prescriptions   No medications on file     Ivin Booty, MD 11/18/15 [place], MD 11/19/15 [phone]

## 2015-11-18 NOTE — ED Triage Notes (Signed)
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 Also states diaphoresis and nausea/vomiting. Previous history of MI. Pt is alert and oriented x4. received 324 ASA, 4mg  zofran. 18g R forearm. 3/10 pain upon arrival to ED.

## 2015-11-18 NOTE — ED Notes (Signed)
Discharge instructions reviewed - voiced understanding.  She will call her PMD on Monday  Daughter with patient

## 2015-11-18 NOTE — ED Notes (Signed)
Pt transported to xray 

## 2015-11-18 NOTE — Discharge Instructions (Signed)
Continue taking your current medicines.   Call Dr. Zenia Resides office on Monday for an appointment   Return to ER if you have worse chest pain, shortness of breath.

## 2015-11-18 NOTE — ED Notes (Signed)
Second set of cardiac enzymes to be drawn at 7pm.

## 2015-11-18 NOTE — ED Provider Notes (Signed)
  Physical Exam  BP 146/71 (BP Location: Right Arm)   Pulse 68   Temp 98.7 F (37.1 C) (Oral)   Resp 16   Ht 5\' 3"  (1.6 m)   Wt 175 lb (79.4 kg)   LMP 07/22/2015   SpO2 98%   BMI 31.00 kg/m   Physical Exam  ED Course  Procedures  MDM Care assumed at 4 pm. Patient hx of ? 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 Had neg cath a year ago. She follows up with Dr. Terrence Dupont. Trop neg x 1. Resident called Dr. Terrence Dupont, who recommend second trop and if neg, can be discharged and follow up in the office. Delta trop neg. Remains pain free. Will dc home. Told her to call the office on Monday.      Drenda Freeze, MD 11/18/15 2019

## 2015-11-25 ENCOUNTER — Encounter: Payer: Self-pay | Admitting: Gynecology

## 2015-11-25 DIAGNOSIS — Z853 Personal history of malignant neoplasm of breast: Secondary | ICD-10-CM | POA: Diagnosis not present

## 2015-11-25 DIAGNOSIS — I2510 Atherosclerotic heart disease of native coronary artery without angina pectoris: Secondary | ICD-10-CM | POA: Diagnosis not present

## 2015-11-25 DIAGNOSIS — Z1231 Encounter for screening mammogram for malignant neoplasm of breast: Secondary | ICD-10-CM | POA: Diagnosis not present

## 2015-11-25 DIAGNOSIS — I480 Paroxysmal atrial fibrillation: Secondary | ICD-10-CM | POA: Diagnosis not present

## 2015-11-25 DIAGNOSIS — I252 Old myocardial infarction: Secondary | ICD-10-CM | POA: Diagnosis not present

## 2015-11-25 DIAGNOSIS — I10 Essential (primary) hypertension: Secondary | ICD-10-CM | POA: Diagnosis not present

## 2016-02-03 DIAGNOSIS — M25562 Pain in left knee: Secondary | ICD-10-CM | POA: Diagnosis not present

## 2016-02-03 DIAGNOSIS — M25561 Pain in right knee: Secondary | ICD-10-CM | POA: Diagnosis not present

## 2016-02-05 DIAGNOSIS — M940 Chondrocostal junction syndrome [Tietze]: Secondary | ICD-10-CM | POA: Diagnosis not present

## 2016-02-24 DIAGNOSIS — I252 Old myocardial infarction: Secondary | ICD-10-CM | POA: Diagnosis not present

## 2016-02-24 DIAGNOSIS — I2510 Atherosclerotic heart disease of native coronary artery without angina pectoris: Secondary | ICD-10-CM | POA: Diagnosis not present

## 2016-02-24 DIAGNOSIS — I480 Paroxysmal atrial fibrillation: Secondary | ICD-10-CM | POA: Diagnosis not present

## 2016-02-24 DIAGNOSIS — I10 Essential (primary) hypertension: Secondary | ICD-10-CM | POA: Diagnosis not present

## 2016-02-28 DIAGNOSIS — G4733 Obstructive sleep apnea (adult) (pediatric): Secondary | ICD-10-CM | POA: Diagnosis not present

## 2016-03-05 HISTORY — PX: CATARACT EXTRACTION W/ INTRAOCULAR LENS  IMPLANT, BILATERAL: SHX1307

## 2016-03-25 ENCOUNTER — Emergency Department (HOSPITAL_COMMUNITY): Payer: PPO

## 2016-03-25 ENCOUNTER — Emergency Department (HOSPITAL_COMMUNITY)
Admission: EM | Admit: 2016-03-25 | Discharge: 2016-03-25 | Disposition: A | Discharge status: Home / Self Care | Payer: PPO | Attending: Emergency Medicine | Admitting: Emergency Medicine

## 2016-03-25 ENCOUNTER — Encounter (HOSPITAL_COMMUNITY): Payer: Self-pay | Admitting: Emergency Medicine

## 2016-03-25 DIAGNOSIS — R05 Cough: Secondary | ICD-10-CM | POA: Diagnosis not present

## 2016-03-25 DIAGNOSIS — Z7982 Long term (current) use of aspirin: Secondary | ICD-10-CM | POA: Insufficient documentation

## 2016-03-25 DIAGNOSIS — J111 Influenza due to unidentified influenza virus with other respiratory manifestations: Secondary | ICD-10-CM | POA: Insufficient documentation

## 2016-03-25 DIAGNOSIS — Z853 Personal history of malignant neoplasm of breast: Secondary | ICD-10-CM | POA: Insufficient documentation

## 2016-03-25 DIAGNOSIS — R509 Fever, unspecified: Secondary | ICD-10-CM | POA: Diagnosis not present

## 2016-03-25 DIAGNOSIS — I10 Essential (primary) hypertension: Secondary | ICD-10-CM | POA: Diagnosis not present

## 2016-03-25 DIAGNOSIS — Z79899 Other long term (current) drug therapy: Secondary | ICD-10-CM | POA: Diagnosis not present

## 2016-03-25 DIAGNOSIS — Z87891 Personal history of nicotine dependence: Secondary | ICD-10-CM | POA: Insufficient documentation

## 2016-03-25 DIAGNOSIS — R0602 Shortness of breath: Secondary | ICD-10-CM | POA: Diagnosis not present

## 2016-03-25 MED ORDER — IPRATROPIUM-ALBUTEROL 0.5-2.5 (3) MG/3ML IN SOLN
3.0000 mL | Freq: Once | RESPIRATORY_TRACT | Status: AC
  Administered 2016-03-25: 3 mL via RESPIRATORY_TRACT
  Filled 2016-03-25: qty 3

## 2016-03-25 MED ORDER — OSELTAMIVIR PHOSPHATE 30 MG PO CAPS
30.0000 mg | ORAL_CAPSULE | Freq: Once | ORAL | Status: AC
  Administered 2016-03-25: 30 mg via ORAL
  Filled 2016-03-25: qty 1

## 2016-03-25 MED ORDER — BENZONATATE 100 MG PO CAPS: 100.0000 mg | ORAL_CAPSULE | Freq: Three times a day (TID) | ORAL | 0 refills | Status: DC

## 2016-03-25 MED ORDER — AEROCHAMBER PLUS W/MASK MISC: 2 refills | Status: DC

## 2016-03-25 MED ORDER — OSELTAMIVIR PHOSPHATE 75 MG PO CAPS: 75.0000 mg | ORAL_CAPSULE | Freq: Two times a day (BID) | ORAL | 0 refills | Status: DC

## 2016-03-25 MED ORDER — ALBUTEROL SULFATE HFA 108 (90 BASE) MCG/ACT IN AERS: 1.0000 | INHALATION_SPRAY | Freq: Four times a day (QID) | RESPIRATORY_TRACT | 0 refills | Status: DC | PRN

## 2016-03-25 MED ORDER — ACETAMINOPHEN 500 MG PO TABS
1000.0000 mg | ORAL_TABLET | Freq: Once | ORAL | Status: AC
  Administered 2016-03-25: 1000 mg via ORAL
  Filled 2016-03-25: qty 2

## 2016-03-25 MED ORDER — ACETAMINOPHEN 500 MG PO TABS: 1000.0000 mg | ORAL_TABLET | Freq: Four times a day (QID) | ORAL | 0 refills | Status: DC | PRN

## 2016-03-25 NOTE — ED Provider Notes (Signed)
[facility] DEPT Provider Note   CSN: [id number] Arrival date & time: 03/25/16  [id number]     History   Chief Complaint Chief Complaint  Patient presents with  . Nausea  . Cough    HPI Wanda Green is a 82 y.o. female.  HPI Patient developed body aches, cough and generalized weakness. 2 days ago. She reports she's also have profuse nasal drainage and sneezing. Generalized headache. Fatigue and weakness. No associated vomiting or diarrhea. No associated chest pain or abdominal pain. Patient poor she felt well just until this started quite acutely. Past Medical History:  Diagnosis Date  . Arthritis   . Breast cancer ([place])   . Cervical dysplasia age 43  . Colitis, ischemic ([place])   . Elevated cholesterol   . Heart attack   . Hypertension   . Osteoporosis 06/2012   T score -2.5  . Sleep apnea     Patient Active Problem List   Diagnosis Date Noted  . Acute coronary syndrome ([place]) 11/24/2014  . Colitis 08/31/2014  . Acute colitis 08/31/2014  . OSA (obstructive sleep apnea) 08/12/2012  . Atrial fibrillation ([place]) 06/24/2012  . HTN (hypertension) 06/24/2012  . Other and unspecified hyperlipidemia 06/24/2012  . Allergic rhinitis due to pollen 06/24/2012  . Breast cancer, stage 2 ([place]) 06/05/2011    Past Surgical History:  Procedure Laterality Date  . BI LATERAL KNEE SCOPE    . BREAST SURGERY  2000   RIGHT BREAST LUMPECTOMY  . CARDIAC CATHETERIZATION N/A 11/25/2014   Procedure: Left Heart Cath and Coronary Angiography;  Surgeon: Dixie Dials, MD;  Location: [facility];  Service: Cardiovascular;  Laterality: N/A;  . CERVICAL CONE BIOPSY  age 45  . COLONOSCOPY WITH PROPOFOL N/A 09/01/2014   Procedure: COLONOSCOPY WITH PROPOFOL;  Surgeon: Ronald Lobo, MD;  Location: [facility];  Service: Endoscopy;  Laterality: N/A;  this will be done unprepped  . COLPOSCOPY    . NASAL SINUS SURGERY  12/14  . TUBAL LIGATION      OB History    Gravida Para Term Preterm AB  Living   3 3 3     3    SAB TAB Ectopic Multiple Live Births                   Home Medications    Prior to Admission medications   Medication Sig Start Date End Date Taking? Authorizing Provider  acetaminophen (TYLENOL) 500 MG tablet Take 500 mg by mouth every 6 (six) hours as needed for mild pain or moderate pain.   Yes Historical Provider, MD  amLODipine (NORVASC) 2.5 MG tablet Take 2.5 mg by mouth at bedtime. 11/15/14  Yes Historical Provider, MD  aspirin EC 81 MG EC tablet Take 1 tablet (81 mg total) by mouth daily. 11/26/14  Yes Dixie Dials, MD  atorvastatin (LIPITOR) 20 MG tablet Take 10 mg by mouth every 3 (three) days.    Yes Historical Provider, MD  cetirizine (ZYRTEC) 10 MG tablet Take 10 mg by mouth at bedtime as needed for allergies.    Yes Historical Provider, MD  guaiFENesin (MUCINEX) 600 MG 12 hr tablet Take 600-1,200 mg by mouth 2 (two) times daily as needed for cough or to loosen phlegm.    Yes Historical Provider, MD  losartan (COZAAR) 100 MG tablet Take 100 mg by mouth daily.   Yes Historical Provider, MD  metoprolol succinate (TOPROL-XL) 25 MG 24 hr tablet Take 25-50 mg by mouth See admin instructions. 25mg   in am, 50mg  in pm   Yes Historical Provider, MD  Multiple Vitamin (MULTIVITAMIN) tablet Take 1 tablet by mouth daily.   Yes Historical Provider, MD  omeprazole (PRILOSEC) 40 MG capsule Take 1 capsule (40 mg total) by mouth daily as needed. Patient taking differently: Take 40 mg by mouth daily as needed (for heartburn).  07/14/12  Yes Leandrew Koyanagi, MD  oxybutynin (DITROPAN-XL) 10 MG 24 hr tablet TAKE 1 TABLET (10 MG TOTAL) BY MOUTH AT BEDTIME. 03/21/15  Yes Anastasio Auerbach, MD  Probiotic Product (PROBIOTIC PO) Take 1 tablet by mouth daily.   Yes Historical Provider, MD  acetaminophen (TYLENOL) 500 MG tablet Take 2 tablets (1,000 mg total) by mouth every 6 (six) hours as needed. 03/25/16   Charlesetta Shanks, MD  albuterol (PROVENTIL HFA;VENTOLIN HFA) 108 (90 Base)  MCG/ACT inhaler Inhale 1-2 puffs into the lungs every 6 (six) hours as needed for wheezing or shortness of breath. 03/25/16   Charlesetta Shanks, MD  benzonatate (TESSALON) 100 MG capsule Take 1 capsule (100 mg total) by mouth every 8 (eight) hours. 03/25/16   Charlesetta Shanks, MD  oseltamivir (TAMIFLU) 75 MG capsule Take 1 capsule (75 mg total) by mouth every 12 (twelve) hours. 03/25/16   Charlesetta Shanks, MD  predniSONE (DELTASONE) 10 MG tablet Sig: 4 tables once a day for 3 days, 3 tablets once a day X3 days, 2 tablets a day for 3 days, 1 tablet a day for 3 days. Patient not taking: Reported on 11/18/2015 07/22/15   Konrad Felix, PA  solifenacin (VESICARE) 10 MG tablet Take 1 tablet (10 mg total) by mouth daily. Patient not taking: Reported on 03/25/2016 04/15/15   Anastasio Auerbach, MD  Spacer/Aero-Holding Chambers (AEROCHAMBER PLUS WITH MASK) inhaler Use as instructed 03/25/16   Charlesetta Shanks, MD    Family History Family History  Problem Relation Age of Onset  . Hypertension Mother   . Heart failure Mother   . Heart disease Mother   . Hypertension Father   . Heart disease Father   . Hypertension Sister   . Uterine cancer Sister   . Hypertension Brother     Social History Social History  Substance Use Topics  . Smoking status: Former Smoker    Packs/day: 0.10    Years: 3.00    Types: Cigarettes    Quit date: 03/05/1972  . Smokeless tobacco: Never Used     Comment: ONE CIG A DAY  . Alcohol use No     Allergies   Penicillins; Atorvastatin; Augmentin [amoxicillin-pot clavulanate]; Codeine; Doxycycline; Hydrocodone; and Oxycodone-acetaminophen   Review of Systems Review of Systems 10 Systems reviewed and are negative for acute change except as noted in the HPI.   Physical Exam Updated Vital Signs BP 139/71   Pulse 82   Temp 98.7 F (37.1 C) (Oral)   Resp (!) 81   Ht 5' 3.5" (1.613 m)   Wt 170 lb (77.1 kg)   LMP 07/22/2015   SpO2 97%   BMI 29.64 kg/m   Physical Exam    Constitutional: She is oriented to person, place, and time. She appears well-developed and well-nourished. No distress.  HENT:  Head: Normocephalic and atraumatic.  Mouth/Throat: Oropharynx is clear and moist.  Eyes: Conjunctivae are normal.  Neck: Neck supple.  Cardiovascular: Normal rate and regular rhythm.   No murmur heard. Pulmonary/Chest: Effort normal. No respiratory distress.  Occasional expiratory wheeze. Dry cough.  Abdominal: Soft. She exhibits no distension. There is no tenderness.  Musculoskeletal: She exhibits no edema.  Neurological: She is alert and oriented to person, place, and time. She exhibits normal muscle tone. Coordination normal.  Skin: Skin is warm and dry.  Psychiatric: She has a normal mood and affect.  Nursing note and vitals reviewed.    ED Treatments / Results  Labs (all labs ordered are listed, but only abnormal results are displayed) Labs Reviewed - No data to display  EKG  EKG Interpretation None       Radiology Dg Chest 2 View  Result Date: 03/25/2016 CLINICAL DATA:  Short of breath, weakness, chills, cough, body aches for 3 days. EXAM: CHEST  2 VIEW COMPARISON:  11/17/2005 T FINDINGS: Normal heart size. Interstitial prominence. Postop changes in the right axilla. No pneumothorax. IMPRESSION: No active cardiopulmonary disease. Electronically Signed   By: Marybelle Killings M.D.   On: 03/25/2016 08:15    Procedures Procedures (including critical care time)  Medications Ordered in ED Medications  ipratropium-albuterol (DUONEB) 0.5-2.5 (3) MG/3ML nebulizer solution 3 mL (3 mLs Nebulization Given 03/25/16 0843)  acetaminophen (TYLENOL) tablet 1,000 mg (1,000 mg Oral Given 03/25/16 0843)  oseltamivir (TAMIFLU) capsule 30 mg (30 mg Oral Given 03/25/16 [id number])     Initial Impression / Assessment and Plan / ED Course  I have reviewed the triage vital signs and the nursing notes.  Pertinent labs & imaging results that were available during my care  of the patient were reviewed by me and considered in my medical decision making (see chart for details).     Recheck: 11:15 patient reports she feels much better after symptomatic treatment. Vital signs remain stable. She has no respiratory distress and clear mental status.  Final Clinical Impressions(s) / ED Diagnoses   Final diagnoses:  Influenza   Patient presents with clinical symptoms consistent with influenza. X-ray does not show focal pneumonia. Clinically, the patient is stable with no respiratory distress and clear mental status. Vital signs are stable. We will treat for influenza with Tamiflu his symptoms started within the past 48 hours. Return symptoms are reviewed with the patient and her daughter who is present. New Prescriptions New Prescriptions   ACETAMINOPHEN (TYLENOL) 500 MG TABLET    Take 2 tablets (1,000 mg total) by mouth every 6 (six) hours as needed.   ALBUTEROL (PROVENTIL HFA;VENTOLIN HFA) 108 (90 BASE) MCG/ACT INHALER    Inhale 1-2 puffs into the lungs every 6 (six) hours as needed for wheezing or shortness of breath.   BENZONATATE (TESSALON) 100 MG CAPSULE    Take 1 capsule (100 mg total) by mouth every 8 (eight) hours.   OSELTAMIVIR (TAMIFLU) 75 MG CAPSULE    Take 1 capsule (75 mg total) by mouth every 12 (twelve) hours.   SPACER/AERO-HOLDING CHAMBERS (AEROCHAMBER PLUS WITH MASK) INHALER    Use as instructed     Charlesetta Shanks, MD 03/25/16 1122

## 2016-03-25 NOTE — ED Triage Notes (Signed)
Pt arrives via [facility] from home for c/o cough, chills nausea and body aches since Friday. Pt denies fever.

## 2016-03-25 NOTE — ED Notes (Signed)
Patient being transported to xray at this time 

## 2016-03-25 NOTE — ED Notes (Signed)
Patient transported to X-ray 

## 2016-03-25 NOTE — ED Notes (Signed)
Patient undressed, in gown, on continuous pulse oximetry and blood pressure cuff 

## 2016-04-04 ENCOUNTER — Ambulatory Visit (INDEPENDENT_AMBULATORY_CARE_PROVIDER_SITE_OTHER): Payer: PPO | Admitting: Emergency Medicine

## 2016-04-04 VITALS — BP 156/78 | HR 72 | Temp 97.6°F | Ht 63.5 in | Wt 171.2 lb

## 2016-04-04 DIAGNOSIS — R0981 Nasal congestion: Secondary | ICD-10-CM | POA: Diagnosis not present

## 2016-04-04 DIAGNOSIS — R519 Headache, unspecified: Secondary | ICD-10-CM

## 2016-04-04 DIAGNOSIS — J0100 Acute maxillary sinusitis, unspecified: Secondary | ICD-10-CM

## 2016-04-04 DIAGNOSIS — R51 Headache: Secondary | ICD-10-CM | POA: Diagnosis not present

## 2016-04-04 MED ORDER — AZITHROMYCIN 250 MG PO TABS: ORAL_TABLET | ORAL | 0 refills | Status: DC

## 2016-04-04 NOTE — Patient Instructions (Addendum)
     IF you received an x-ray today, you will receive an invoice from [facility] Radiology. Please contact [facility] Radiology at [phone] with questions or concerns regarding your invoice.   IF you received labwork today, you will receive an invoice from [facility]. Please contact [facility] at [phone] with questions or concerns regarding your invoice.   Our billing staff will not be able to assist you with questions regarding bills from these companies.  You will be contacted with the lab results as soon as they are available. The fastest way to get your results is to activate your My Chart account. Instructions are located on the last page of this paperwork. If you have not heard from [place] regarding the results in 2 weeks, please contact this office.      Sinus Headache A sinus headache happens when your sinuses become clogged or swollen. You may feel pain or pressure in your face, forehead, ears, or upper teeth. Sinus headaches can be mild or severe. Follow these instructions at home:  Take medicines only as told by your doctor.  If you were given an antibiotic medicine, finish all of it even if you start to feel better.  Use a nose spray if you feel stuffed up (congested).  If told, apply a warm, moist washcloth to your face to help lessen pain. Contact a doctor if:  You get headaches more than one time each week.  Light or sound bothers you.  You have a fever.  You feel sick to your stomach (nauseous) or you throw up (vomit).  Your headaches do not get better with treatment. Get help right away if:  You have trouble seeing.  You suddenly have very bad pain in your face or head.  You start to twitch or shake (seizure).  You are confused.  You have a stiff neck. This information is not intended to replace advice given to you by your health care provider. Make sure you discuss any questions you have with your health care provider. Document Released: 06/21/2010  Document Revised: 10/16/2015 Document Reviewed: 02/15/2014 Elsevier Interactive Patient Education  2017 [employer].

## 2016-04-04 NOTE — Progress Notes (Signed)
Wanda Green 82 y.o.   Chief Complaint  Patient presents with  . Headache    X 1 week  . Sinus Problem    X 1 1/2 Pt states brownish mucus  . Nasal Congestion    X 1 week     HISTORY OF PRESENT ILLNESS: This is a 82 y.o. female complaining of sinus infection  X 1 week. Had flu recently.  Sinus Problem  This is a new problem. The current episode started in the past 7 days. The problem has been gradually worsening since onset. There has been no fever. Her pain is at a severity of 3/10. The pain is mild. Associated symptoms include congestion, headaches and sinus pressure. Pertinent negatives include no chills, coughing, diaphoresis, ear pain, hoarse voice, neck pain, shortness of breath, sore throat or swollen glands.     Prior to Admission medications   Medication Sig Start Date End Date Taking? Authorizing Provider  acetaminophen (TYLENOL) 500 MG tablet Take 500 mg by mouth every 6 (six) hours as needed for mild pain or moderate pain.   Yes Historical Provider, MD  acetaminophen (TYLENOL) 500 MG tablet Take 2 tablets (1,000 mg total) by mouth every 6 (six) hours as needed. 03/25/16  Yes Charlesetta Shanks, MD  albuterol (PROVENTIL HFA;VENTOLIN HFA) 108 (90 Base) MCG/ACT inhaler Inhale 1-2 puffs into the lungs every 6 (six) hours as needed for wheezing or shortness of breath. 03/25/16  Yes Charlesetta Shanks, MD  amLODipine (NORVASC) 2.5 MG tablet Take 2.5 mg by mouth at bedtime. 11/15/14  Yes Historical Provider, MD  aspirin EC 81 MG EC tablet Take 1 tablet (81 mg total) by mouth daily. 11/26/14  Yes Dixie Dials, MD  atorvastatin (LIPITOR) 20 MG tablet Take 10 mg by mouth every 3 (three) days.    Yes Historical Provider, MD  benzonatate (TESSALON) 100 MG capsule Take 1 capsule (100 mg total) by mouth every 8 (eight) hours. 03/25/16  Yes Charlesetta Shanks, MD  cetirizine (ZYRTEC) 10 MG tablet Take 10 mg by mouth at bedtime as needed for allergies.    Yes Historical Provider, MD  guaiFENesin  (MUCINEX) 600 MG 12 hr tablet Take 600-1,200 mg by mouth 2 (two) times daily as needed for cough or to loosen phlegm.    Yes Historical Provider, MD  losartan (COZAAR) 100 MG tablet Take 100 mg by mouth daily.   Yes Historical Provider, MD  metoprolol succinate (TOPROL-XL) 25 MG 24 hr tablet Take 25-50 mg by mouth See admin instructions. 25mg  in am, 50mg  in pm   Yes Historical Provider, MD  Multiple Vitamin (MULTIVITAMIN) tablet Take 1 tablet by mouth daily.   Yes Historical Provider, MD  omeprazole (PRILOSEC) 40 MG capsule Take 1 capsule (40 mg total) by mouth daily as needed. Patient taking differently: Take 40 mg by mouth daily as needed (for heartburn).  07/14/12  Yes Leandrew Koyanagi, MD  oseltamivir (TAMIFLU) 75 MG capsule Take 1 capsule (75 mg total) by mouth every 12 (twelve) hours. 03/25/16  Yes Charlesetta Shanks, MD  oxybutynin (DITROPAN-XL) 10 MG 24 hr tablet TAKE 1 TABLET (10 MG TOTAL) BY MOUTH AT BEDTIME. 03/21/15  Yes Anastasio Auerbach, MD  Probiotic Product (PROBIOTIC PO) Take 1 tablet by mouth daily.   Yes Historical Provider, MD  solifenacin (VESICARE) 10 MG tablet Take 1 tablet (10 mg total) by mouth daily. 04/15/15  Yes Anastasio Auerbach, MD  Spacer/Aero-Holding Chambers (AEROCHAMBER PLUS WITH MASK) inhaler Use as instructed 03/25/16  Yes Jeannie Done  Pfeiffer, MD  predniSONE (DELTASONE) 10 MG tablet Sig: 4 tables once a day for 3 days, 3 tablets once a day X3 days, 2 tablets a day for 3 days, 1 tablet a day for 3 days. Patient not taking: Reported on 04/04/2016 07/22/15   Konrad Felix, PA    Allergies  Allergen Reactions  . Penicillins Nausea And Vomiting and Other (See Comments)    Caused bleeding also Has patient had a PCN reaction causing immediate rash, facial/tongue/throat swelling, SOB or lightheadedness with hypotension: Yes Has patient had a PCN reaction causing severe rash involving mucus membranes or skin necrosis: No Has patient had a PCN reaction that required  hospitalization No Has patient had a PCN reaction occurring within the last 10 years: Yes If all of the above answers are "NO", then may proceed with Cephalosporin use.   . Atorvastatin Other (See Comments)    Muscle and leg myalgias  . Augmentin [Amoxicillin-Pot Clavulanate] Nausea And Vomiting and Other (See Comments)    Mouth pain  . Codeine Nausea And Vomiting  . Doxycycline Nausea And Vomiting  . Hydrocodone Nausea And Vomiting  . Oxycodone-Acetaminophen Nausea And Vomiting    Patient Active Problem List   Diagnosis Date Noted  . Acute coronary syndrome ([place]) 11/24/2014  . Colitis 08/31/2014  . Acute colitis 08/31/2014  . OSA (obstructive sleep apnea) 08/12/2012  . Atrial fibrillation ([place]) 06/24/2012  . HTN (hypertension) 06/24/2012  . Other and unspecified hyperlipidemia 06/24/2012  . Allergic rhinitis due to pollen 06/24/2012  . Breast cancer, stage 2 ([place]) 06/05/2011    Past Medical History:  Diagnosis Date  . Arthritis   . Breast cancer ([place])   . Cervical dysplasia age 76  . Colitis, ischemic ([place])   . Elevated cholesterol   . Heart attack   . Hypertension   . Osteoporosis 06/2012   T score -2.5  . Sleep apnea     Past Surgical History:  Procedure Laterality Date  . BI LATERAL KNEE SCOPE    . BREAST SURGERY  2000   RIGHT BREAST LUMPECTOMY  . CARDIAC CATHETERIZATION N/A 11/25/2014   Procedure: Left Heart Cath and Coronary Angiography;  Surgeon: Dixie Dials, MD;  Location: [facility];  Service: Cardiovascular;  Laterality: N/A;  . CERVICAL CONE BIOPSY  age 66  . COLONOSCOPY WITH PROPOFOL N/A 09/01/2014   Procedure: COLONOSCOPY WITH PROPOFOL;  Surgeon: Ronald Lobo, MD;  Location: [facility];  Service: Endoscopy;  Laterality: N/A;  this will be done unprepped  . COLPOSCOPY    . NASAL SINUS SURGERY  12/14  . TUBAL LIGATION      Social History   Social History  . Marital status: Widowed    Spouse name: N/A  . Number of children: N/A  .  Years of education: N/A   Occupational History  . part time [employer] History Main Topics  . Smoking status: Former Smoker    Packs/day: 0.10    Years: 3.00    Types: Cigarettes    Quit date: 03/05/1972  . Smokeless tobacco: Never Used     Comment: ONE CIG A DAY  . Alcohol use No  . Drug use: No  . Sexual activity: Not Currently    Birth control/ protection: Post-menopausal   Other Topics Concern  . Not on file   Social History Narrative  . No narrative on file    Family History  Problem Relation Age of Onset  . Hypertension Mother   .  Heart failure Mother   . Heart disease Mother   . Hypertension Father   . Heart disease Father   . Hypertension Sister   . Uterine cancer Sister   . Hypertension Brother      Review of Systems  Constitutional: Negative for chills, diaphoresis, fever and malaise/fatigue.  HENT: Positive for congestion, sinus pain and sinus pressure. Negative for ear pain, hoarse voice, nosebleeds and sore throat.   Eyes: Negative for blurred vision, discharge and redness.  Respiratory: Negative for cough, hemoptysis, shortness of breath and wheezing.   Cardiovascular: Negative for chest pain, palpitations and leg swelling.  Gastrointestinal: Negative for abdominal pain, diarrhea, nausea and vomiting.  Genitourinary: Negative for dysuria and hematuria.  Musculoskeletal: Negative for neck pain.  Skin: Negative for rash.  Neurological: Positive for headaches.  All other systems reviewed and are negative.   Vitals:   04/04/16 1730  BP: (!) 156/78  Pulse: 72  Temp: 97.6 F (36.4 C)    Physical Exam  Constitutional: She is oriented to person, place, and time. She appears well-developed and well-nourished.  HENT:  Head: Normocephalic and atraumatic.  Nose: Right sinus exhibits maxillary sinus tenderness. Left sinus exhibits maxillary sinus tenderness.  Mouth/Throat: Oropharynx is clear and moist.  Eyes: Conjunctivae and EOM are  normal. Pupils are equal, round, and reactive to light.  Neck: Normal range of motion. Neck supple.  Cardiovascular: Normal rate, regular rhythm and normal heart sounds.   Pulmonary/Chest: Effort normal and breath sounds normal. She has no wheezes. She has no rales.  Abdominal: Soft. There is no tenderness.  Musculoskeletal: Normal range of motion.  Neurological: She is alert and oriented to person, place, and time. No sensory deficit. She exhibits normal muscle tone.  Skin: Skin is warm and dry. Capillary refill takes less than 2 seconds.  Psychiatric: She has a normal mood and affect. Her behavior is normal.  Vitals reviewed.    ASSESSMENT & PLAN: Wanda Green was seen today for headache, sinus problem and nasal congestion.  Diagnoses and all orders for this visit:  Acute non-recurrent maxillary sinusitis  Sinus congestion  Sinus headache  Other orders -     azithromycin (ZITHROMAX) 250 MG tablet; Sig as indicated    Patient Instructions       IF you received an x-ray today, you will receive an invoice from [facility] Radiology. Please contact [facility] Radiology at [phone] with questions or concerns regarding your invoice.   IF you received labwork today, you will receive an invoice from [facility]. Please contact [facility] at [phone] with questions or concerns regarding your invoice.   Our billing staff will not be able to assist you with questions regarding bills from these companies.  You will be contacted with the lab results as soon as they are available. The fastest way to get your results is to activate your My Chart account. Instructions are located on the last page of this paperwork. If you have not heard from [place] regarding the results in 2 weeks, please contact this office.      Sinus Headache A sinus headache happens when your sinuses become clogged or swollen. You may feel pain or pressure in your face, forehead, ears, or upper teeth. Sinus headaches can  be mild or severe. Follow these instructions at home:  Take medicines only as told by your doctor.  If you were given an antibiotic medicine, finish all of it even if you start to feel better.  Use a nose spray if you feel  stuffed up (congested).  If told, apply a warm, moist washcloth to your face to help lessen pain. Contact a doctor if:  You get headaches more than one time each week.  Light or sound bothers you.  You have a fever.  You feel sick to your stomach (nauseous) or you throw up (vomit).  Your headaches do not get better with treatment. Get help right away if:  You have trouble seeing.  You suddenly have very bad pain in your face or head.  You start to twitch or shake (seizure).  You are confused.  You have a stiff neck. This information is not intended to replace advice given to you by your health care provider. Make sure you discuss any questions you have with your health care provider. Document Released: 06/21/2010 Document Revised: 10/16/2015 Document Reviewed: 02/15/2014 Elsevier Interactive Patient Education  2017 [employer]      Agustina Caroli, MD [facility]

## 2016-04-20 ENCOUNTER — Ambulatory Visit (INDEPENDENT_AMBULATORY_CARE_PROVIDER_SITE_OTHER): Payer: PPO | Admitting: Gynecology

## 2016-04-20 ENCOUNTER — Encounter: Payer: Self-pay | Admitting: Gynecology

## 2016-04-20 VITALS — BP 120/76 | Ht 63.0 in | Wt 170.0 lb

## 2016-04-20 DIAGNOSIS — Z01411 Encounter for gynecological examination (general) (routine) with abnormal findings: Secondary | ICD-10-CM

## 2016-04-20 DIAGNOSIS — N3281 Overactive bladder: Secondary | ICD-10-CM

## 2016-04-20 DIAGNOSIS — M810 Age-related osteoporosis without current pathological fracture: Secondary | ICD-10-CM

## 2016-04-20 DIAGNOSIS — N952 Postmenopausal atrophic vaginitis: Secondary | ICD-10-CM

## 2016-04-20 MED ORDER — OXYBUTYNIN CHLORIDE 5 MG PO TABS: 5.0000 mg | ORAL_TABLET | Freq: Three times a day (TID) | ORAL | 12 refills | Status: DC

## 2016-04-20 NOTE — Patient Instructions (Signed)
Office will call you to arrange for the bone density.

## 2016-04-20 NOTE — Progress Notes (Signed)
    Wanda Green 1935/02/07 [id number]        82 y.o.  G3P3003 for breast and pelvic exam.  Past medical history,surgical history, problem list, medications, allergies, family history and social history were all reviewed and documented as reviewed in the EPIC chart.  ROS:  Performed with pertinent positives and negatives included in the history, assessment and plan.   Additional significant findings :  None   Exam: Caryn Bee assistant Vitals:   04/20/16 1119  BP: 120/76  Weight: 170 lb (77.1 kg)  Height: 5\' 3"  (1.6 m)   Body mass index is 30.11 kg/m.  General appearance:  Normal affect, orientation and appearance. Skin: Grossly normal HEENT: Without gross lesions.  No cervical or supraclavicular adenopathy. Thyroid normal.  Lungs:  Clear without wheezing, rales or rhonchi Cardiac: RR, without RMG Abdominal:  Soft, nontender, without masses, guarding, rebound, organomegaly or hernia Breasts:  Examined lying and sitting without masses, retractions, discharge or axillary adenopathy. Well-healed right lumpectomy scar Pelvic:  Ext, BUS, Vagina with atrophic changes  Cervix with atrophic changes  Uterus anteverted, normal size, shape and contour, midline and mobile nontender   Adnexa without masses or tenderness    Anus and perineum normal   Rectovaginal normal sphincter tone without palpated masses or tenderness.    Assessment/Plan:  82 y.o. G72P3003 female for breast and pelvic exam.   1. Postmenopausal/atrophic genital changes. No significant hot flushes, night sweats, vaginal dryness or any vaginal bleeding. Continue to monitor report any issues or bleeding. 2. Detrusor instability. Using Detrol XL. Does know by the end of day she's having leaking episodes. Will try oxybutynin 5 mg 3 times a day and see if this doesn't help her maintain continence throughout the day. Will call me if she has any issues. Refill 1 year provided. 3. Osteoporosis. DEXA 2014 T score -2.5.  Started on Fosamax but stopped due to GI side effects. Was to repeat DEXA last year but never did. Recommended she schedule this year and she agrees to arrange. 4. History of right breast cancer. Exam NED. Mammography 11/2015. Continue to follow up with her oncologist. Continue with annual mammography. SBE monthly reviewed. 5. Colonoscopy 2016. Repeat at their recommended interval. 6. Pap smear 2011. No Pap smear done today. History of cone biopsy age 64 with normal Pap smears since. We both agree to stop screening based on age and current screening guidelines. 7. Health maintenance. No routine lab work done as patient does this elsewhere. Follow up 1 year, sooner as needed.   Anastasio Auerbach MD, 11:57 AM 04/20/2016

## 2016-05-16 ENCOUNTER — Telehealth: Payer: Self-pay | Admitting: Gynecology

## 2016-05-16 NOTE — Telephone Encounter (Signed)
Phone call to [place]  She needs a Friday for bone density . I talked with Sharrie Rothman and I will do her bone density in the pm on a Monday. Appointment made.

## 2016-06-04 ENCOUNTER — Other Ambulatory Visit: Payer: Self-pay | Admitting: Gynecology

## 2016-06-04 DIAGNOSIS — Z780 Asymptomatic menopausal state: Secondary | ICD-10-CM

## 2016-06-13 DIAGNOSIS — E785 Hyperlipidemia, unspecified: Secondary | ICD-10-CM | POA: Diagnosis not present

## 2016-06-13 DIAGNOSIS — I10 Essential (primary) hypertension: Secondary | ICD-10-CM | POA: Diagnosis not present

## 2016-06-15 DIAGNOSIS — M25561 Pain in right knee: Secondary | ICD-10-CM | POA: Diagnosis not present

## 2016-06-15 DIAGNOSIS — I2510 Atherosclerotic heart disease of native coronary artery without angina pectoris: Secondary | ICD-10-CM | POA: Diagnosis not present

## 2016-06-15 DIAGNOSIS — I10 Essential (primary) hypertension: Secondary | ICD-10-CM | POA: Diagnosis not present

## 2016-06-15 DIAGNOSIS — M25562 Pain in left knee: Secondary | ICD-10-CM | POA: Diagnosis not present

## 2016-06-15 DIAGNOSIS — I252 Old myocardial infarction: Secondary | ICD-10-CM | POA: Diagnosis not present

## 2016-06-15 DIAGNOSIS — I480 Paroxysmal atrial fibrillation: Secondary | ICD-10-CM | POA: Diagnosis not present

## 2016-07-02 ENCOUNTER — Ambulatory Visit (INDEPENDENT_AMBULATORY_CARE_PROVIDER_SITE_OTHER): Payer: PPO

## 2016-07-02 DIAGNOSIS — M810 Age-related osteoporosis without current pathological fracture: Secondary | ICD-10-CM | POA: Diagnosis not present

## 2016-07-02 DIAGNOSIS — Z780 Asymptomatic menopausal state: Secondary | ICD-10-CM

## 2016-07-05 ENCOUNTER — Other Ambulatory Visit: Payer: Self-pay | Admitting: Gynecology

## 2016-07-05 ENCOUNTER — Encounter: Payer: Self-pay | Admitting: Gynecology

## 2016-07-05 ENCOUNTER — Telehealth: Payer: Self-pay | Admitting: Gynecology

## 2016-07-05 DIAGNOSIS — M810 Age-related osteoporosis without current pathological fracture: Secondary | ICD-10-CM

## 2016-07-05 NOTE — Telephone Encounter (Signed)
Tell patient most recent bone density does show some loss at the hips. Recommend office visit to discuss treatment options.

## 2016-07-06 NOTE — Telephone Encounter (Signed)
Pt informed, transferred to front desk 

## 2016-07-20 ENCOUNTER — Ambulatory Visit: Payer: PPO | Admitting: Gynecology

## 2016-08-03 ENCOUNTER — Encounter: Payer: Self-pay | Admitting: Pulmonary Disease

## 2016-08-03 ENCOUNTER — Ambulatory Visit (INDEPENDENT_AMBULATORY_CARE_PROVIDER_SITE_OTHER): Payer: PPO | Admitting: Pulmonary Disease

## 2016-08-03 VITALS — BP 122/74 | HR 52 | Ht 65.0 in | Wt 170.0 lb

## 2016-08-03 DIAGNOSIS — G4733 Obstructive sleep apnea (adult) (pediatric): Secondary | ICD-10-CM

## 2016-08-03 NOTE — Assessment & Plan Note (Signed)
CPAP supplies will be renewed for a year. Unclear why she remains sleepy in spite of good compliance on CPAP We will obtain a report on your CPAP machine to ensure that settings are correct  Weight loss encouraged, compliance with goal of at least 4-6 hrs every night is the expectation. Advised against medications with sedative side effects Cautioned against driving when sleepy - understanding that sleepiness will vary on a day to day basis

## 2016-08-03 NOTE — Progress Notes (Signed)
   Subjective:    Patient ID: Wanda Green, female    DOB: [birth year], 82 y.o.   MRN: [id number]  HPI  82 year old woman with CAD & hypertension presents to reestablish care for obstructive sleep apnea  She was diagnosed in 2014 and initially placed on auto settings. Due to persistence of nausea and underwent titration study in 2017 which showed CPAP requirement of 11 cm to correct events. She reports good compliance with her machine. Bedtime is around 11 PM, sleep latency is minimal, reports one to 2 nocturnal awakenings and is out of bed at 6 AM feeling sleepy but gets tired after. She still works as an Glass blower/designer in [employer] Her nasal pillows are worn out and feels there is no sleepiness may be related to this She denies problems with mask and pressure or dryness  She underwent sinus surgery in 2016  Significant tests/ events reviewed 08/2012 HST AHI 7/h  03/2015 CPAP titration 11 cm   Review of Systems  Constitutional: Negative for fever and unexpected weight change.  HENT: Negative for congestion, dental problem, ear pain, nosebleeds, postnasal drip, rhinorrhea, sinus pressure, sneezing, sore throat and trouble swallowing.   Eyes: Negative for redness and itching.  Respiratory: Negative for cough, chest tightness, shortness of breath and wheezing.   Cardiovascular: Negative for palpitations and leg swelling.  Gastrointestinal: Negative for nausea and vomiting.  Genitourinary: Negative for dysuria.  Musculoskeletal: Negative for joint swelling.  Skin: Negative for rash.  Neurological: Negative for headaches.  Hematological: Does not bruise/bleed easily.  Psychiatric/Behavioral: Negative for dysphoric mood. The patient is not nervous/anxious.        Objective:   Physical Exam   Gen. Pleasant, well-nourished, in no distress ENT - no thrush, no post nasal drip Neck: No JVD, no thyromegaly, no carotid bruits Lungs: no use of accessory muscles, no dullness to  percussion, clear without rales or rhonchi  Cardiovascular: Rhythm regular, heart sounds  normal, no murmurs or gallops, no peripheral edema Musculoskeletal: No deformities, no cyanosis or clubbing         Assessment & Plan:

## 2016-08-03 NOTE — Patient Instructions (Signed)
CPAP supplies will be renewed for a year. We will obtain a report on your CPAP machine to ensure that settings are correct

## 2016-08-06 ENCOUNTER — Encounter: Payer: Self-pay | Admitting: Physician Assistant

## 2016-08-06 ENCOUNTER — Ambulatory Visit (INDEPENDENT_AMBULATORY_CARE_PROVIDER_SITE_OTHER): Payer: PPO | Admitting: Physician Assistant

## 2016-08-06 VITALS — BP 140/60 | HR 63 | Temp 97.7°F | Resp 18 | Ht 65.0 in | Wt 170.4 lb

## 2016-08-06 DIAGNOSIS — J0100 Acute maxillary sinusitis, unspecified: Secondary | ICD-10-CM

## 2016-08-06 DIAGNOSIS — W57XXXA Bitten or stung by nonvenomous insect and other nonvenomous arthropods, initial encounter: Secondary | ICD-10-CM | POA: Diagnosis not present

## 2016-08-06 DIAGNOSIS — G4733 Obstructive sleep apnea (adult) (pediatric): Secondary | ICD-10-CM | POA: Diagnosis not present

## 2016-08-06 MED ORDER — SULFAMETHOXAZOLE-TRIMETHOPRIM 800-160 MG PO TABS: 1.0000 | ORAL_TABLET | Freq: Two times a day (BID) | ORAL | 0 refills | Status: DC

## 2016-08-06 NOTE — Patient Instructions (Signed)
     IF you received an x-ray today, you will receive an invoice from [facility]. Please contact [facility] at [phone] with questions or concerns regarding your invoice.   IF you received labwork today, you will receive an invoice from [facility]. Please contact [facility] at [phone] with questions or concerns regarding your invoice.   Our billing staff will not be able to assist you with questions regarding bills from these companies.  You will be contacted with the lab results as soon as they are available. The fastest way to get your results is to activate your My Chart account. Instructions are located on the last page of this paperwork. If you have not heard from us regarding the results in 2 weeks, please contact this office.     

## 2016-08-06 NOTE — Progress Notes (Signed)
Wanda Green  MRN: [id number] DOB: Mar 01, 1935  PCP: Charolette Forward, MD  Chief Complaint  Patient presents with  . Sinus Problem    x 2 weeks   . Tick Removal    would like to discuss     Subjective:  Pt presents to clinic for sinus pressure and congestion.  She has had problems with her allergies esp after cutting the grass.  She has sinus pressure with headaches that worsens when she bends over.  She does not have teeth pain.   Over the last 2 months she has removed 6 ticks.  She does not know what type of ticks they were.  She feels ok but just wanted to check to see if she needed to do something.  Review of Systems  Constitutional: Negative for chills and fever.  HENT: Positive for congestion and postnasal drip (worse in the am - taste bad). Negative for rhinorrhea.   Respiratory: Negative for cough.   Allergic/Immunologic: Positive for environmental allergies.    Patient Active Problem List   Diagnosis Date Noted  . Acute coronary syndrome ([place]) 11/24/2014  . Colitis 08/31/2014  . Acute colitis 08/31/2014  . OSA (obstructive sleep apnea) 08/12/2012  . Atrial fibrillation ([place]) 06/24/2012  . HTN (hypertension) 06/24/2012  . Other and unspecified hyperlipidemia 06/24/2012  . Allergic rhinitis due to pollen 06/24/2012  . Breast cancer, stage 2 ([place]) 06/05/2011    Current Outpatient Prescriptions on File Prior to Visit  Medication Sig Dispense Refill  . acetaminophen (TYLENOL) 500 MG tablet Take 2 tablets (1,000 mg total) by mouth every 6 (six) hours as needed. 30 tablet 0  . amLODipine (NORVASC) 2.5 MG tablet Take 2.5 mg by mouth at bedtime.  3  . aspirin EC 81 MG EC tablet Take 1 tablet (81 mg total) by mouth daily.    Marland Kitchen atorvastatin (LIPITOR) 20 MG tablet Take 10 mg by mouth every 3 (three) days.     Marland Kitchen losartan (COZAAR) 100 MG tablet Take 100 mg by mouth daily.    . metoprolol succinate (TOPROL-XL) 25 MG 24 hr tablet Take 25-50 mg by mouth See admin instructions.  25mg  in am, 50mg  in pm    . Multiple Vitamin (MULTIVITAMIN) tablet Take 1 tablet by mouth daily.    Marland Kitchen omeprazole (PRILOSEC) 40 MG capsule Take 1 capsule (40 mg total) by mouth daily as needed. (Patient taking differently: Take 40 mg by mouth daily as needed (for heartburn). ) 90 capsule 3  . oxybutynin (DITROPAN) 5 MG tablet Take 1 tablet (5 mg total) by mouth 3 (three) times daily. 90 tablet 12  . Probiotic Product (PROBIOTIC PO) Take 1 tablet by mouth daily.     No current facility-administered medications on file prior to visit.     Allergies  Allergen Reactions  . Penicillins Nausea And Vomiting and Other (See Comments)    Caused bleeding also Has patient had a PCN reaction causing immediate rash, facial/tongue/throat swelling, SOB or lightheadedness with hypotension: Yes Has patient had a PCN reaction causing severe rash involving mucus membranes or skin necrosis: No Has patient had a PCN reaction that required hospitalization No Has patient had a PCN reaction occurring within the last 10 years: Yes If all of the above answers are "NO", then may proceed with Cephalosporin use.   . Atorvastatin Other (See Comments)    Muscle and leg myalgias  . Augmentin [Amoxicillin-Pot Clavulanate] Nausea And Vomiting and Other (See Comments)    Mouth pain  .  Codeine Nausea And Vomiting  . Doxycycline Nausea And Vomiting  . Hydrocodone Nausea And Vomiting  . Oxycodone-Acetaminophen Nausea And Vomiting    Pt patients past, family and social history were reviewed and updated.   Objective:  BP 140/60   Pulse 63   Temp 97.7 F (36.5 C) (Oral)   Resp 18   Ht 5\' 5"  (1.651 m)   Wt 170 lb 6.4 oz (77.3 kg)   LMP 07/22/2015   SpO2 98%   BMI 28.36 kg/m   Physical Exam  Constitutional: She is oriented to person, place, and time and well-developed, well-nourished, and in no distress.  HENT:  Head: Normocephalic and atraumatic.  Right Ear: Hearing, tympanic membrane, external ear and ear canal  normal.  Left Ear: Hearing, tympanic membrane, external ear and ear canal normal.  Nose: Right sinus exhibits maxillary sinus tenderness.  Mouth/Throat: Uvula is midline, oropharynx is clear and moist and mucous membranes are normal.  Eyes: Conjunctivae are normal.  Neck: Normal range of motion.  Cardiovascular: Normal rate, regular rhythm and normal heart sounds.   No murmur heard. Pulmonary/Chest: Effort normal and breath sounds normal.  Neurological: She is alert and oriented to person, place, and time. Gait normal.  Skin: Skin is warm and dry.  Psychiatric: Mood, memory, affect and judgment normal.  Vitals reviewed.   Assessment and Plan :  Acute non-recurrent maxillary sinusitis - Plan: sulfamethoxazole-trimethoprim (BACTRIM DS,SEPTRA DS) 800-160 MG tablet  Tick bite, initial encounter d/w pt to be mindful of type of ticks that she is removing - she feels fine currently and would like to monitor her symptoms and does not want lab work today - we discussed symptoms of tick borne illness to know when to [place] PA-C  [facility] [place] 08/07/2016 9:08 AM

## 2016-08-10 ENCOUNTER — Ambulatory Visit (INDEPENDENT_AMBULATORY_CARE_PROVIDER_SITE_OTHER): Payer: PPO | Admitting: Gynecology

## 2016-08-10 ENCOUNTER — Encounter: Payer: Self-pay | Admitting: Gynecology

## 2016-08-10 VITALS — BP 124/78

## 2016-08-10 DIAGNOSIS — M810 Age-related osteoporosis without current pathological fracture: Secondary | ICD-10-CM | POA: Diagnosis not present

## 2016-08-10 MED ORDER — ALENDRONATE SODIUM 70 MG PO TABS: 70.0000 mg | ORAL_TABLET | ORAL | 11 refills | Status: DC

## 2016-08-10 NOTE — Progress Notes (Signed)
    Wanda Green 1934/07/06 [id number]        82 y.o.  G3P3003 presents to discuss her most recent bone density which shows osteoporosis T score -3 at left femoral neck -2.6 right femoral neck. Shows a 4% AP spine, 8% left total hip, 11% right total hip decline from her 2011 study. Transiently tried Fosamax several times with GERD several years ago.  Past medical history,surgical history, problem list, medications, allergies, family history and social history were all reviewed and documented in the EPIC chart.  Directed ROS with pertinent positives and negatives documented in the history of present illness/assessment and plan.  Exam: Vitals:   08/10/16 1101  BP: 124/78   General appearance:  Normal   Assessment/Plan:  82 y.o. G3P3003 . With osteoporosis. I reviewed treatment options with her to include bisphosphonates such as alendronate or Actonel, Prolia, Evista noting history of breast cancer in the past and Forteo. Risks/benefits, side effects reviewed. Exacerbation of GERD, osteonecrosis of the jaw, atypical fractures, rashes, infections, thrombosis risks all reviewed. After lengthy discussion the patient wants another trial of Fosamax 70 mg weekly to see how she does at this time. I reviewed how to take the medication and what to look out for. Patient will call me if she has any issues after initiation. Fosamax 70 mg #4 with 11 refills provided.  Greater than 50% of my time was spent in direct face to face counseling and coordination of care with the patient.     Anastasio Auerbach MD, 11:23 AM 08/10/2016

## 2016-08-10 NOTE — Patient Instructions (Signed)
Alendronate tablets  What is this medicine?  ALENDRONATE (a LEN droe nate) slows calcium loss from bones. It helps to make normal healthy bone and to slow bone loss in people with Paget's disease and osteoporosis. It may be used in others at risk for bone loss.  This medicine may be used for other purposes; ask your health care provider or pharmacist if you have questions.  COMMON BRAND NAME(S): Fosamax  What should I tell my health care provider before I take this medicine?  They need to know if you have any of these conditions:  -dental disease  -esophagus, stomach, or intestine problems, like acid reflux or GERD  -kidney disease  -low blood calcium  -low vitamin D  -problems sitting or standing 30 minutes  -trouble swallowing  -an unusual or allergic reaction to alendronate, other medicines, foods, dyes, or preservatives  -pregnant or trying to get pregnant  -breast-feeding  How should I use this medicine?  You must take this medicine exactly as directed or you will lower the amount of the medicine you absorb into your body or you may cause yourself harm. Take this medicine by mouth first thing in the morning, after you are up for the day. Do not eat or drink anything before you take your medicine. Swallow the tablet with a full glass (6 to 8 fluid ounces) of plain water. Do not take this medicine with any other drink. Do not chew or crush the tablet. After taking this medicine, do not eat breakfast, drink, or take any medicines or vitamins for at least 30 minutes. Sit or stand up for at least 30 minutes after you take this medicine; do not lie down. Do not take your medicine more often than directed.  Talk to your pediatrician regarding the use of this medicine in children. Special care may be needed.  Overdosage: If you think you have taken too much of this medicine contact a poison control center or emergency room at once.  NOTE: This medicine is only for you. Do not share this medicine with others.  What if I  miss a dose?  If you miss a dose, do not take it later in the day. Continue your normal schedule starting the next morning. Do not take double or extra doses.  What may interact with this medicine?  -aluminum hydroxide  -antacids  -aspirin  -calcium supplements  -drugs for inflammation like ibuprofen, naproxen, and others  -iron supplements  -magnesium supplements  -vitamins with minerals  This list may not describe all possible interactions. Give your health care provider a list of all the medicines, herbs, non-prescription drugs, or dietary supplements you use. Also tell them if you smoke, drink alcohol, or use illegal drugs. Some items may interact with your medicine.  What should I watch for while using this medicine?  Visit your doctor or health care professional for regular checks ups. It may be some time before you see benefit from this medicine. Do not stop taking your medicine except on your doctor's advice. Your doctor or health care professional may order blood tests and other tests to see how you are doing.  You should make sure you get enough calcium and vitamin D while you are taking this medicine, unless your doctor tells you not to. Discuss the foods you eat and the vitamins you take with your health care professional.  Some people who take this medicine have severe bone, joint, and/or muscle pain. This medicine may also increase your risk   for a broken thigh bone. Tell your doctor right away if you have pain in your upper leg or groin. Tell your doctor if you have any pain that does not go away or that gets worse.  This medicine can make you more sensitive to the sun. If you get a rash while taking this medicine, sunlight may cause the rash to get worse. Keep out of the sun. If you cannot avoid being in the sun, wear protective clothing and use sunscreen. Do not use sun lamps or tanning beds/booths.  What side effects may I notice from receiving this medicine?  Side effects that you should report to  your doctor or health care professional as soon as possible:  -allergic reactions like skin rash, itching or hives, swelling of the face, lips, or tongue  -black or tarry stools  -bone, muscle or joint pain  -changes in vision  -chest pain  -heartburn or stomach pain  -jaw pain, especially after dental work  -pain or trouble when swallowing  -redness, blistering, peeling or loosening of the skin, including inside the mouth  Side effects that usually do not require medical attention (report to your doctor or health care professional if they continue or are bothersome):  -changes in taste  -diarrhea or constipation  -eye pain or itching  -headache  -nausea or vomiting  -stomach gas or fullness  This list may not describe all possible side effects. Call your doctor for medical advice about side effects. You may report side effects to FDA at 1-800-FDA-1088.  Where should I keep my medicine?  Keep out of the reach of children.  Store at room temperature of 15 and 30 degrees C (59 and 86 degrees F). Throw away any unused medicine after the expiration date.  NOTE: This sheet is a summary. It may not cover all possible information. If you have questions about this medicine, talk to your doctor, pharmacist, or health care provider.   2018 Elsevier/Gold Standard (2010-08-18 08:56:09)

## 2016-08-24 DIAGNOSIS — M170 Bilateral primary osteoarthritis of knee: Secondary | ICD-10-CM | POA: Diagnosis not present

## 2016-08-28 DIAGNOSIS — G4733 Obstructive sleep apnea (adult) (pediatric): Secondary | ICD-10-CM | POA: Diagnosis not present

## 2016-09-07 DIAGNOSIS — M170 Bilateral primary osteoarthritis of knee: Secondary | ICD-10-CM | POA: Diagnosis not present

## 2016-09-14 DIAGNOSIS — H52221 Regular astigmatism, right eye: Secondary | ICD-10-CM | POA: Diagnosis not present

## 2016-09-14 DIAGNOSIS — I10 Essential (primary) hypertension: Secondary | ICD-10-CM | POA: Diagnosis not present

## 2016-09-14 DIAGNOSIS — Z961 Presence of intraocular lens: Secondary | ICD-10-CM | POA: Diagnosis not present

## 2016-09-14 DIAGNOSIS — M170 Bilateral primary osteoarthritis of knee: Secondary | ICD-10-CM | POA: Diagnosis not present

## 2016-09-14 DIAGNOSIS — I2510 Atherosclerotic heart disease of native coronary artery without angina pectoris: Secondary | ICD-10-CM | POA: Diagnosis not present

## 2016-09-14 DIAGNOSIS — E785 Hyperlipidemia, unspecified: Secondary | ICD-10-CM | POA: Diagnosis not present

## 2016-09-14 DIAGNOSIS — G4733 Obstructive sleep apnea (adult) (pediatric): Secondary | ICD-10-CM | POA: Diagnosis not present

## 2016-09-14 DIAGNOSIS — F419 Anxiety disorder, unspecified: Secondary | ICD-10-CM | POA: Diagnosis not present

## 2016-09-14 DIAGNOSIS — H5213 Myopia, bilateral: Secondary | ICD-10-CM | POA: Diagnosis not present

## 2016-09-14 DIAGNOSIS — D649 Anemia, unspecified: Secondary | ICD-10-CM | POA: Diagnosis not present

## 2016-09-14 DIAGNOSIS — M1990 Unspecified osteoarthritis, unspecified site: Secondary | ICD-10-CM | POA: Diagnosis not present

## 2016-09-14 DIAGNOSIS — I480 Paroxysmal atrial fibrillation: Secondary | ICD-10-CM | POA: Diagnosis not present

## 2016-09-14 DIAGNOSIS — I252 Old myocardial infarction: Secondary | ICD-10-CM | POA: Diagnosis not present

## 2016-10-17 ENCOUNTER — Telehealth: Payer: Self-pay | Admitting: Pulmonary Disease

## 2016-10-17 NOTE — Telephone Encounter (Signed)
Spoke with patient. She stated that she has been falling asleep during the day. She is still using her CPAP machine but wonders if the pressure needs to be adjusted. Advised patient that I will call [facility] to get a recent download for RA to review.   [facility] will send over the most recent D/L which is from June 2018. They will ask the patient to come into the office for a download.   Upon looking at her chart, it appears that the order for her supplies was sent to the wrong company. I will send a new order once RA has reviewed her download.

## 2016-10-24 NOTE — Telephone Encounter (Signed)
I don't have anything on this patient. RA will be back in the office on Sept 10th.

## 2016-10-24 NOTE — Telephone Encounter (Signed)
CP please advise if RA has reviewed the pts DL.  thanks

## 2016-10-26 NOTE — Telephone Encounter (Signed)
I have spoken with Ailene Ravel with [facility]. Ailene Ravel states the most recent download they have is from June, as pt will need to bring in machine for more recent download.  I have requested that [facility] fax over download.

## 2016-10-26 NOTE — Telephone Encounter (Signed)
The most recent download is now on your desk.

## 2016-10-26 NOTE — Telephone Encounter (Signed)
Please obtain download & leave  on my desk

## 2016-10-30 NOTE — Telephone Encounter (Signed)
DL is still on RA's desk for review.

## 2016-10-30 NOTE — Telephone Encounter (Signed)
I have reviewed her download. CPAP at 11 cm seems to be working well and no residual events are noted. Her compliance is poor and I would advise increasing usage to at least 4-6 hours every night. I note that her OSA was mild to begin with-not sure what is causing her increased sleepiness, does not seem to be related to medications. If this persists we can proceed with a nap test

## 2016-10-31 NOTE — Telephone Encounter (Signed)
Pt is aware of RA's recommendations and voiced her understanding. Nothing further needed at this time.

## 2016-11-30 DIAGNOSIS — M170 Bilateral primary osteoarthritis of knee: Secondary | ICD-10-CM | POA: Diagnosis not present

## 2016-12-07 DIAGNOSIS — Z853 Personal history of malignant neoplasm of breast: Secondary | ICD-10-CM | POA: Diagnosis not present

## 2016-12-07 DIAGNOSIS — Z1231 Encounter for screening mammogram for malignant neoplasm of breast: Secondary | ICD-10-CM | POA: Diagnosis not present

## 2016-12-28 DIAGNOSIS — M170 Bilateral primary osteoarthritis of knee: Secondary | ICD-10-CM | POA: Diagnosis not present

## 2016-12-31 DIAGNOSIS — M25561 Pain in right knee: Secondary | ICD-10-CM | POA: Diagnosis not present

## 2016-12-31 DIAGNOSIS — M25562 Pain in left knee: Secondary | ICD-10-CM | POA: Diagnosis not present

## 2017-01-01 ENCOUNTER — Encounter: Payer: Self-pay | Admitting: Gynecology

## 2017-01-01 ENCOUNTER — Ambulatory Visit (INDEPENDENT_AMBULATORY_CARE_PROVIDER_SITE_OTHER): Payer: PPO | Admitting: Gynecology

## 2017-01-01 VITALS — BP 140/80

## 2017-01-01 DIAGNOSIS — N950 Postmenopausal bleeding: Secondary | ICD-10-CM | POA: Diagnosis not present

## 2017-01-01 DIAGNOSIS — I480 Paroxysmal atrial fibrillation: Secondary | ICD-10-CM | POA: Diagnosis not present

## 2017-01-01 DIAGNOSIS — G4733 Obstructive sleep apnea (adult) (pediatric): Secondary | ICD-10-CM | POA: Diagnosis not present

## 2017-01-01 DIAGNOSIS — F419 Anxiety disorder, unspecified: Secondary | ICD-10-CM | POA: Diagnosis not present

## 2017-01-01 DIAGNOSIS — D649 Anemia, unspecified: Secondary | ICD-10-CM | POA: Diagnosis not present

## 2017-01-01 DIAGNOSIS — I10 Essential (primary) hypertension: Secondary | ICD-10-CM | POA: Diagnosis not present

## 2017-01-01 DIAGNOSIS — M1990 Unspecified osteoarthritis, unspecified site: Secondary | ICD-10-CM | POA: Diagnosis not present

## 2017-01-01 DIAGNOSIS — I2510 Atherosclerotic heart disease of native coronary artery without angina pectoris: Secondary | ICD-10-CM | POA: Diagnosis not present

## 2017-01-01 DIAGNOSIS — E785 Hyperlipidemia, unspecified: Secondary | ICD-10-CM | POA: Diagnosis not present

## 2017-01-01 NOTE — Progress Notes (Signed)
    GEORGE ALCANTAR [birth year] [id number]        82 y.o.  G3P3003 presents showering this morning and noticing blood in the shower where on further investigation she found that she was doing some vaginal bleeding.  No pain or discomfort.  No cramping bloating or other symptoms.  No urinary symptoms such as frequency dysuria urgency low back pain.  No diarrhea constipation.  No history of same before.  Bleeding has subsequently resolved.  Past medical history,surgical history, problem list, medications, allergies, family history and social history were all reviewed and documented in the EPIC chart.  Directed ROS with pertinent positives and negatives documented in the history of present illness/assessment and plan.  Exam: Caryn Bee assistant Vitals:   01/01/17 1236  BP: 140/80   General appearance:  Normal External BUS vagina with atrophic changes.  Cervix with atrophic changes.  Pap smear done.  Uterus grossly normal midline mobile nontender.  Adnexa without masses or tenderness.  Assessment/Plan:  82 y.o. G3P3003 with single episode of postmenopausal bleeding earlier today.  Vagina without evidence of bleeding now.  The differential reviewed from atrophic to polyps/structural, hyperplastic and uterine cancer.  Recommended patient follow-up for sonohysterogram for pelvic surveillance and endometrial assessment.  Patient agrees to schedule and follow-up for this.    Anastasio Auerbach MD, 12:52 PM 01/01/2017

## 2017-01-01 NOTE — Patient Instructions (Signed)
Follow up for ultrasound as scheduled 

## 2017-01-01 NOTE — Addendum Note (Signed)
Addended by: Nelva Nay on: 01/01/2017 12:57 PM   Modules accepted: Orders

## 2017-01-02 ENCOUNTER — Other Ambulatory Visit: Payer: Self-pay | Admitting: Gynecology

## 2017-01-02 DIAGNOSIS — N950 Postmenopausal bleeding: Secondary | ICD-10-CM

## 2017-01-03 LAB — PAP IG W/ RFLX HPV ASCU

## 2017-01-14 ENCOUNTER — Encounter: Payer: Self-pay | Admitting: Gynecology

## 2017-01-14 ENCOUNTER — Ambulatory Visit: Payer: PPO | Admitting: Gynecology

## 2017-01-14 ENCOUNTER — Ambulatory Visit (INDEPENDENT_AMBULATORY_CARE_PROVIDER_SITE_OTHER): Payer: PPO

## 2017-01-14 ENCOUNTER — Other Ambulatory Visit: Payer: Self-pay | Admitting: Gynecology

## 2017-01-14 VITALS — BP 120/78

## 2017-01-14 DIAGNOSIS — N83202 Unspecified ovarian cyst, left side: Secondary | ICD-10-CM

## 2017-01-14 DIAGNOSIS — N950 Postmenopausal bleeding: Secondary | ICD-10-CM

## 2017-01-14 DIAGNOSIS — N858 Other specified noninflammatory disorders of uterus: Secondary | ICD-10-CM | POA: Diagnosis not present

## 2017-01-14 NOTE — Patient Instructions (Signed)
Follow-up for ultrasound in 3-6 months.

## 2017-01-14 NOTE — Progress Notes (Signed)
    Wanda Green May 10, 1934 [id number]        82 y.o.  G3P3003 presents for sonohysterogram.  Had a single episode of vaginal bleeding end of last month.  No bleeding since.  Past medical history,surgical history, problem list, medications, allergies, family history and social history were all reviewed and documented in the EPIC chart.  Directed ROS with pertinent positives and negatives documented in the history of present illness/assessment and plan.  Exam: Wanda Green assistant General appearance:  Normal Abdomen soft nontender without masses guarding rebound Pelvic external BUS vagina with atrophic changes.  Cervix with atrophic changes.  Uterus grossly normal midline mobile nontender.  Adnexa without gross masses.  Ultrasound transvaginal and transabdominal shows uterus anteverted with normal size and echotexture.  Endometrial echo 2.8 mm.  Right ovary normal.  Left ovary not visualized.  Left adnexal echo free more avascular cyst noted at 36 x 27 x 25 mm.  Cul-de-sac negative.  Sonohysterogram performed, sterile technique, easy catheter introduction, adequate distention with no abnormalities seen.  Endometrial biopsy taken with scant return consistent with thin endometrial echo.  Patient tolerated well.  Assessment/Plan:  82 y.o. [id number] with:  1. Single episode of postmenopausal bleeding.  This followed taking nonsteroidal anti-inflammatory which she was told in the past not to take due to easy bleeding.  Endometrial echoes thin.  Patient will follow-up for endometrial biopsy results.  Reviewed various possibilities to include inadequate specimen which would be acceptable given the thin endometrium.  She will keep a bleeding calendar and as long as no further bleeding will monitor.  If any further bleeding then will call. 2. Echo-free simple avascular left adnexal cyst 29 mm mean B.  On review of her records there was a left adnexal cyst noted on CT scan in 2016.  Reviewed differential  to include benign versus malignant.  Given the small size, simple nature and avascular all points towards benign.  Recommend follow-up ultrasound in 3-6 months for stability.  Patient agrees with this and is comfortable with this approach.    Wanda Auerbach MD, 11:23 AM 01/14/2017

## 2017-01-21 DIAGNOSIS — G4733 Obstructive sleep apnea (adult) (pediatric): Secondary | ICD-10-CM | POA: Diagnosis not present

## 2017-01-22 ENCOUNTER — Telehealth: Payer: Self-pay | Admitting: Pulmonary Disease

## 2017-01-22 DIAGNOSIS — G4733 Obstructive sleep apnea (adult) (pediatric): Secondary | ICD-10-CM

## 2017-01-22 NOTE — Telephone Encounter (Signed)
Spoke with pt. States that she is having issues with her CPAP machine. Reports that the machine stopped working 2 weeks ago. Pt was adamant that she got her current CPAP machine from our office in 2014. Advised pt that we do not supply pt's with CPAP machine out of our office. Pt continued to state that she got the machine here. An order will be sent to [facility] (per the pt's request) for a new machine. Nothing further was needed.

## 2017-01-28 DIAGNOSIS — M1712 Unilateral primary osteoarthritis, left knee: Secondary | ICD-10-CM | POA: Diagnosis present

## 2017-02-08 ENCOUNTER — Other Ambulatory Visit (HOSPITAL_COMMUNITY): Payer: PPO

## 2017-02-08 ENCOUNTER — Telehealth: Payer: Self-pay | Admitting: Pulmonary Disease

## 2017-02-08 NOTE — Telephone Encounter (Signed)
Called pt to verify what she meant by [facility] needing a script for cpap. Unable to reach pt, left her a message to call our office.

## 2017-02-08 NOTE — Telephone Encounter (Signed)
Called [facility] and spoke with Corene Cornea who tried to get respiratory on the phone. Corene Cornea told me that they have a place held for pt tomorrow, 02/09/17 at 1:00 for her to come in since she missed her appt with them today, but they are needing pt to call and confirm her appt.  Went out to the lobby to tell pt this information and she stated to me that she will not be able to go tomorrow, 02/09/17 at 1:00.  I told pt she needed to call [facility] to see if there was something she could get worked out with them.  Pt expressed understanding. Nothing further needed.

## 2017-02-08 NOTE — Telephone Encounter (Signed)
Patient is waiting in the lobby; states she went to [facility] and was not seen today because [facility] had her down for the appointment for 10:00am, but patient states she confirmed the appointment for 1:00pm. Patient is requesting to speak to a nurse because she has no CPAP machine at the home, and would like to know if she could get assistance today so she can get a CPAP machine because she has been sleeping without one for over a month.. Pt states she received a CPAP machine from this office 4 years ago; explained to patient we do not have or carry CPAP machines that have to come from a DME company. Pt requesting to speak to a nurse

## 2017-02-15 ENCOUNTER — Telehealth: Payer: Self-pay | Admitting: Pulmonary Disease

## 2017-02-15 DIAGNOSIS — G4733 Obstructive sleep apnea (adult) (pediatric): Secondary | ICD-10-CM

## 2017-02-15 NOTE — Telephone Encounter (Signed)
Fax # is [phone].

## 2017-02-15 NOTE — Telephone Encounter (Signed)
I have faxed order to [facility].  Nothing further needed.

## 2017-02-15 NOTE — Telephone Encounter (Signed)
Spoke with pt, she states she does not want to go to [facility] because she was scheduled for two appts and every time she went they stated she didn't have an appt. She would like to send her CPAP order to home [facility]. [facility]

## 2017-03-15 DIAGNOSIS — G4733 Obstructive sleep apnea (adult) (pediatric): Secondary | ICD-10-CM | POA: Diagnosis not present

## 2017-03-18 DIAGNOSIS — K559 Vascular disorder of intestine, unspecified: Secondary | ICD-10-CM | POA: Diagnosis present

## 2017-03-18 DIAGNOSIS — M1712 Unilateral primary osteoarthritis, left knee: Secondary | ICD-10-CM | POA: Diagnosis not present

## 2017-03-18 NOTE — H&P (Addendum)
PREOPERATIVE H&P Patient ID: [name] MRN: [id number] DOB/AGE: June 11, 1934 83 y.o.  Chief Complaint: OA LEFT KNEE  Planned Procedure Date: 04/09/2017 Medical and cardiac Clearance by Dr. Terrence Dupont Pulmonology: Dr. Elsworth Soho Hematology: Dr. Beryle Beams  HPI: Wanda Green is a 83 y.o. female with a history of ischemic colitis, vasculitis, OSA on CPAP, NSTEMI without CABG/stenting (coronaries open on cath 11/2014), HTN, and breast cancer.  She presents for evaluation of OA LEFT KNEE. The patient has a history of pain and functional disability in the left knee due to arthritis and has failed non-surgical conservative treatments for greater than 12 weeks to include NSAID's and/or analgesics, corticosteriod injections, viscosupplementation injections and activity modification.  Onset of symptoms was gradual, starting 4 years ago with gradually worsening course since that time. The patient noted prior procedures on the knee to include  arthroscopy and menisectomy  bilaterally.  Patient currently rates pain at 8 out of 10 with activity. Patient has night pain, worsening of pain with activity and weight bearing and pain that interferes with activities of daily living.  Patient has evidence of subchondral cysts, subchondral sclerosis, periarticular osteophytes and joint space narrowing by imaging studies.  There is no active infection.  Past Medical History:  Diagnosis Date  . Arthritis   . Breast cancer ([place])   . Cervical dysplasia age 8  . Colitis, ischemic ([place])   . Elevated cholesterol   . Heart attack ([place])   . Hypertension   . Osteoporosis 06/2016   T score -3.0  . Sleep apnea    Past Surgical History:  Procedure Laterality Date  . BI LATERAL KNEE SCOPE    . BREAST SURGERY  2000   RIGHT BREAST LUMPECTOMY  . CARDIAC CATHETERIZATION N/A 11/25/2014   Procedure: Left Heart Cath and Coronary Angiography;  Surgeon: Dixie Dials, MD;  Location: [facility];  Service: Cardiovascular;   Laterality: N/A;  . CATARACT EXTRACTION, BILATERAL    . CERVICAL CONE BIOPSY  age 53  . COLONOSCOPY WITH PROPOFOL N/A 09/01/2014   Procedure: COLONOSCOPY WITH PROPOFOL;  Surgeon: Ronald Lobo, MD;  Location: [facility];  Service: Endoscopy;  Laterality: N/A;  this will be done unprepped  . COLPOSCOPY    . NASAL SINUS SURGERY  12/14  . TUBAL LIGATION     Allergies  Allergen Reactions  . Penicillins Nausea And Vomiting and Other (See Comments)    Caused bleeding also Has patient had a PCN reaction causing immediate rash, facial/tongue/throat swelling, SOB or lightheadedness with hypotension: Yes Has patient had a PCN reaction causing severe rash involving mucus membranes or skin necrosis: No Has patient had a PCN reaction that required hospitalization No Has patient had a PCN reaction occurring within the last 10 years: Yes If all of the above answers are "NO", then may proceed with Cephalosporin use.   . Atorvastatin Other (See Comments)    Muscle and leg myalgias  . Augmentin [Amoxicillin-Pot Clavulanate] Nausea And Vomiting and Other (See Comments)    Mouth pain  . Codeine Nausea And Vomiting  . Doxycycline Nausea And Vomiting  . Hydrocodone Nausea And Vomiting  . Oxycodone-Acetaminophen Nausea And Vomiting   Prior to Admission medications   Medication Sig Start Date End Date Taking? Authorizing Provider  acetaminophen (TYLENOL) 500 MG tablet Take 2 tablets (1,000 mg total) by mouth every 6 (six) hours as needed. 03/25/16   Charlesetta Shanks, MD  alendronate (FOSAMAX) 70 MG tablet Take 1 tablet (70 mg total) by mouth every 7 (  seven) days. Take with a full glass of water on an empty stomach. 08/10/16   Fontaine, Belinda Block, MD  amLODipine (NORVASC) 2.5 MG tablet Take 2.5 mg by mouth at bedtime. 11/15/14   [provider]  aspirin EC 81 MG EC tablet Take 1 tablet (81 mg total) by mouth daily. 11/26/14   Dixie Dials, MD  atorvastatin (LIPITOR) 20 MG tablet Take 10 mg by mouth  every 3 (three) days.     [provider]  losartan (COZAAR) 100 MG tablet Take 100 mg by mouth daily.    [provider]  metoprolol succinate (TOPROL-XL) 25 MG 24 hr tablet Take 25-50 mg by mouth See admin instructions. 25mg  in am, 50mg  in pm    [provider]  Multiple Vitamin (MULTIVITAMIN) tablet Take 1 tablet by mouth daily.    [provider]  omeprazole (PRILOSEC) 40 MG capsule Take 1 capsule (40 mg total) by mouth daily as needed. Patient taking differently: Take 40 mg by mouth daily as needed (for heartburn).  07/14/12   Leandrew Koyanagi, MD  oxybutynin (DITROPAN) 5 MG tablet Take 1 tablet (5 mg total) by mouth 3 (three) times daily. 04/20/16   Fontaine, Belinda Block, MD   Social History  Widowed, former smoker- one cigarette per day for 3 years.  Quit in 1969.  No EtOH.  Lives with her daughter.  Family History  Problem Relation Age of Onset  . Hypertension Mother   . Heart failure Mother   . Heart disease Mother   . Hypertension Father   . Heart disease Father   . Hypertension Sister   . Uterine cancer Sister   . Hypertension Brother   . Heart disease Brother     ROS: Currently denies lightheadedness, dizziness, Fever, chills, CP, SOB.   No personal history of DVT, PE, or CVA. No loose teeth or dentures All other systems have been reviewed and were otherwise currently negative with the exception of those mentioned in the HPI and as above.  Objective: Vitals: Ht: 5 feet 3 inches wt: 167 temp: 98.4 BP: 169/74 pulse: 67 O2 97 % on room air. Physical Exam: General: Alert, NAD.  Antalgic Gait  HEENT: EOMI, Good Neck Extension  Pulm: No increased work of breathing.  Clear B/L A/P w/o crackle or wheeze.  CV: RRR, 1/6 SEM GI: soft, NT, ND Neuro: Neuro without gross focal deficit.  Sensation intact distally Skin: No lesions in the area of chief complaint MSK/Surgical Site: Left knee w/o redness or effusion.  + JLT. ROM 2.5-95.  5/5 strength  in extension and flexion.  +EHL/FHL.  NVI.  Stable varus and valgus stress.    Imaging Review Plain radiographs demonstrate severe degenerative joint disease of both knees  Assessment: OA LEFT KNEE Principal Problem:   Primary osteoarthritis of left knee Active Problems:   HTN (hypertension)   OSA (obstructive sleep apnea)   Colitis, ischemic ([place])   Plan: Plan for Procedure(s): LEFT TOTAL KNEE ARTHROPLASTY  The patient history, physical exam, clinical judgement of the provider and imaging are consistent with end stage degenerative joint disease and total joint arthroplasty is deemed medically necessary. The treatment options including medical management, injection therapy, and arthroplasty were discussed at length. The risks and benefits of Procedure(s): LEFT TOTAL KNEE ARTHROPLASTY were presented and reviewed.  The risks of nonoperative treatment, versus surgical intervention including but not limited to continued pain, aseptic loosening, stiffness, dislocation/subluxation, infection, bleeding, nerve injury, blood clots, cardiopulmonary complications, morbidity, mortality,  among others were discussed. The patient verbalizes understanding and wishes to proceed with the plan.  Patient is being admitted for inpatient treatment for surgery, pain control, PT, OT, prophylactic antibiotics, VTE prophylaxis, progressive ambulation, ADL's and discharge planning.   Dental prophylaxis discussed and recommended for 2 years postoperatively.   The patient does meet the criteria for TXA which will be used perioperatively via IV.    Per Hematology: Either Lovenox 40 mg x 14 days or Eliquis 2.5 mg BID x 5 days then ASA 81 mg for remainder of 28 days will be used postoperatively for DVT prophylaxis in addition to SCDs, and early ambulation.  H/O LGIB.    No PO NSAIDs  Codeine has caused N/V, but she denies hx of taking oxycodone or other narcotic medicines.  May need to adjust  postoperatively.  The patient is planning to be discharged home with home health services ([facility]) in care of her daughter Sharyn Lull who she lives with.  Severity of Illness: The appropriate patient status for this patient is OBSERVATION. Observation status is judged to be reasonable and necessary in order to provide the required intensity of service to ensure the patient's safety. The patient's presenting symptoms, physical exam findings, and initial radiographic and laboratory data in the context of their medical condition is felt to place them at decreased risk for further clinical deterioration. Furthermore, it is anticipated that the patient will be medically stable for discharge from the hospital within 2 midnights of admission. The following factors support the patient status of observation.  [place] III, PA-C 03/18/2017 3:40 PM

## 2017-03-27 NOTE — Pre-Procedure Instructions (Signed)
BRAYLEN DENUNZIO  03/27/2017      [id number] [name], [address] [place]  [address] Phone: [phone] Fax: [place], [address] [place] [address] Phone: [phone] Fax: [phone]    Your procedure is scheduled on February 5  Report to [place] at [email or web address].  Call this number if you have problems the morning of surgery:  [phone]   Remember:  Do not eat food or drink liquids after midnight.  Continue all medications as directed by your physician except follow these medication instructions before surgery below   Take these medicines the morning of surgery with A SIP OF WATER  acetaminophen (TYLENOL) metoprolol succinate (TOPROL-XL) take it the night before surgery if that is when you normally take your medications omeprazole (PRILOSEC) oxybutynin (DITROPAN)  7 days prior to surgery STOP taking any Aspirin(unless otherwise instructed by your surgeon), Aleve, Naproxen, Ibuprofen, Motrin, Advil, Goody's, BC's, all herbal medications, fish oil, and all vitamins  Follow your doctors instructions regarding your Aspirin.  If no instructions were given by your doctor, then you will need to call the prescribing office office to get instructions.      Do not wear jewelry, make-up or nail polish.  Do not wear lotions, powders, or perfumes, or deodorant.  Do not shave 48 hours prior to surgery.  Men may shave face and neck.  Do not bring valuables to the hospital.  [facility] is not responsible for any belongings or valuables.  Contacts, dentures or bridgework may not be worn into surgery.  Leave your suitcase in the car.  After surgery it may be brought to your room.  For patients admitted to the hospital, discharge time will be determined by your treatment team.  Patients discharged the day of surgery will not be  allowed to drive home.    Special instructions:   [place]- Preparing For Surgery  Before surgery, you can play an important role. Because skin is not sterile, your skin needs to be as free of germs as possible. You can reduce the number of germs on your skin by washing with CHG (chlorahexidine gluconate) Soap before surgery.  CHG is an antiseptic cleaner which kills germs and bonds with the skin to continue killing germs even after washing.  Please do not use if you have an allergy to CHG or antibacterial soaps. If your skin becomes reddened/irritated stop using the CHG.  Do not shave (including legs and underarms) for at least 48 hours prior to first CHG shower. It is OK to shave your face.  Please follow these instructions carefully.   1. Shower the NIGHT BEFORE SURGERY and the MORNING OF SURGERY with CHG.   2. If you chose to wash your hair, wash your hair first as usual with your normal shampoo.  3. After you shampoo, rinse your hair and body thoroughly to remove the shampoo.  4. Use CHG as you would any other liquid soap. You can apply CHG directly to the skin and wash gently with a scrungie or a clean washcloth.   5. Apply the CHG Soap to your body ONLY FROM THE NECK DOWN.  Do not use on open wounds or open sores. Avoid contact with your eyes, ears, mouth and genitals (private parts). Wash Face and genitals (private parts)  with your normal soap.  6. Wash thoroughly, paying  special attention to the area where your surgery will be performed.  7. Thoroughly rinse your body with warm water from the neck down.  8. DO NOT shower/wash with your normal soap after using and rinsing off the CHG Soap.  9. Pat yourself dry with a CLEAN TOWEL.  10. Wear CLEAN PAJAMAS to bed the night before surgery, wear comfortable clothes the morning of surgery  11. Place CLEAN SHEETS on your bed the night of your first shower and DO NOT SLEEP WITH PETS.    Day of Surgery: Do not apply any  deodorants/lotions. Please wear clean clothes to the hospital/surgery center.      Please read over the following fact sheets that you were given.

## 2017-03-28 ENCOUNTER — Encounter (HOSPITAL_COMMUNITY): Payer: Self-pay

## 2017-03-28 ENCOUNTER — Other Ambulatory Visit: Payer: Self-pay

## 2017-03-28 ENCOUNTER — Encounter (HOSPITAL_COMMUNITY)
Admission: RE | Admit: 2017-03-28 | Discharge: 2017-03-28 | Disposition: A | Discharge status: Home / Self Care | Payer: PPO | Source: Ambulatory Visit | Attending: Orthopedic Surgery | Admitting: Orthopedic Surgery

## 2017-03-28 DIAGNOSIS — Z7982 Long term (current) use of aspirin: Secondary | ICD-10-CM | POA: Insufficient documentation

## 2017-03-28 DIAGNOSIS — Z87891 Personal history of nicotine dependence: Secondary | ICD-10-CM | POA: Diagnosis not present

## 2017-03-28 DIAGNOSIS — I10 Essential (primary) hypertension: Secondary | ICD-10-CM | POA: Insufficient documentation

## 2017-03-28 DIAGNOSIS — G4733 Obstructive sleep apnea (adult) (pediatric): Secondary | ICD-10-CM | POA: Insufficient documentation

## 2017-03-28 DIAGNOSIS — Z01812 Encounter for preprocedural laboratory examination: Secondary | ICD-10-CM | POA: Diagnosis not present

## 2017-03-28 DIAGNOSIS — R011 Cardiac murmur, unspecified: Secondary | ICD-10-CM | POA: Insufficient documentation

## 2017-03-28 DIAGNOSIS — Z01810 Encounter for preprocedural cardiovascular examination: Secondary | ICD-10-CM | POA: Diagnosis not present

## 2017-03-28 DIAGNOSIS — I4891 Unspecified atrial fibrillation: Secondary | ICD-10-CM | POA: Diagnosis not present

## 2017-03-28 DIAGNOSIS — Z79899 Other long term (current) drug therapy: Secondary | ICD-10-CM | POA: Diagnosis not present

## 2017-03-28 HISTORY — DX: Claustrophobia: F40.240

## 2017-03-28 HISTORY — DX: Cardiac murmur, unspecified: R01.1

## 2017-03-28 HISTORY — DX: Nausea with vomiting, unspecified: R11.2

## 2017-03-28 HISTORY — DX: Adverse effect of unspecified anesthetic, initial encounter: T41.45XA

## 2017-03-28 HISTORY — DX: Anemia, unspecified: D64.9

## 2017-03-28 HISTORY — DX: Nausea with vomiting, unspecified: Z98.890

## 2017-03-28 HISTORY — DX: Gastro-esophageal reflux disease without esophagitis: K21.9

## 2017-03-28 HISTORY — DX: Other complications of anesthesia, initial encounter: T88.59XA

## 2017-03-28 HISTORY — DX: Cardiac arrhythmia, unspecified: I49.9

## 2017-03-28 LAB — CBC
HEMATOCRIT: 33.7 % — AB (ref 36.0–46.0)
Hemoglobin: 11.1 g/dL — ABNORMAL LOW (ref 12.0–15.0)
MCH: 30.6 pg (ref 26.0–34.0)
MCHC: 32.9 g/dL (ref 30.0–36.0)
MCV: 92.8 fL (ref 78.0–100.0)
PLATELETS: 227 10*3/uL (ref 150–400)
RBC: 3.63 MIL/uL — ABNORMAL LOW (ref 3.87–5.11)
RDW: 13.4 % (ref 11.5–15.5)
WBC: 6.3 10*3/uL (ref 4.0–10.5)

## 2017-03-28 LAB — SURGICAL PCR SCREEN
MRSA, PCR: NEGATIVE
Staphylococcus aureus: POSITIVE — AB

## 2017-03-28 LAB — BASIC METABOLIC PANEL
Anion gap: 10 (ref 5–15)
BUN: 18 mg/dL (ref 6–20)
CHLORIDE: 105 mmol/L (ref 101–111)
CO2: 24 mmol/L (ref 22–32)
CREATININE: 1.13 mg/dL — AB (ref 0.44–1.00)
Calcium: 9.5 mg/dL (ref 8.9–10.3)
GFR calc Af Amer: 52 mL/min — ABNORMAL LOW (ref >60)
GFR calc non Af Amer: 45 mL/min — ABNORMAL LOW (ref >60)
GLUCOSE: 116 mg/dL — AB (ref 65–99)
POTASSIUM: 4.3 mmol/L (ref 3.5–5.1)
Sodium: 139 mmol/L (ref 135–145)

## 2017-03-28 NOTE — Pre-Procedure Instructions (Signed)
Wanda Green  03/28/2017      [id number] - Wanda Green, [name] [address] Phone: [phone] Fax: [place], [address] [place] [address] Phone: [phone] Fax: [phone]    Your procedure is scheduled on  Tuesday, February 5   Report to Wanda Green at [place].M.             (posted surgery time 7:30a - 9:30a)   Call this number if you have problems the morning of surgery:  [phone]   Remember:   Do not eat food or drink liquids after midnight, Monday.  Continue all medications as directed by your physician except follow these medication instructions before surgery below   Take these medicines the morning of surgery with A SIP OF WATER  acetaminophen (TYLENOL) metoprolol succinate (TOPROL-XL) take it the night before surgery if that is when you normally take your medications omeprazole (PRILOSEC) oxybutynin (DITROPAN)  7 days prior to surgery STOP taking any Aspirin(unless otherwise instructed by your surgeon), Aleve, Naproxen, Ibuprofen, Motrin, Advil, Goody's, BC's, all herbal medications, fish oil, and all vitamins  Follow your doctors instructions regarding your Aspirin.  If no instructions were given by your doctor, then you will need to call the prescribing office office to get instructions.      Do not wear jewelry, make-up or nail polish.  Do not wear lotions, powders, or perfumes, or deodorant.  Do not shave 48 hours prior to surgery.     Do not bring valuables to the hospital.  [facility] is not responsible for any belongings or valuables.  Contacts, dentures or bridgework may not be worn into surgery.  Leave your suitcase in the car.  After surgery it may be brought to your room.  For patients admitted to the hospital, discharge time will be determined by your treatment  team.    Special instructions:   [place]- Preparing For Surgery  Before surgery, you can play an important role. Because skin is not sterile, your skin needs to be as free of germs as possible. You can reduce the number of germs on your skin by washing with CHG (chlorahexidine gluconate) Soap before surgery.  CHG is an antiseptic cleaner which kills germs and bonds with the skin to continue killing germs even after washing.  Please do not use if you have an allergy to CHG or antibacterial soaps. If your skin becomes reddened/irritated stop using the CHG.  Do not shave (including legs and underarms) for at least 48 hours prior to first CHG shower. It is OK to shave your face.  Please follow these instructions carefully.   1. Shower the NIGHT BEFORE SURGERY and the MORNING OF SURGERY with CHG.   2. If you chose to wash your hair, wash your hair first as usual with your normal shampoo.  3. After you shampoo, rinse your hair and body thoroughly to remove the shampoo.  4. Use CHG as you would any other liquid soap. You can apply CHG directly to the skin and wash gently with a scrungie or a clean washcloth.   5. Apply the CHG Soap to your body ONLY FROM THE NECK DOWN.  Do not use on open wounds or open sores. Avoid contact with your eyes, ears, mouth and genitals (private parts). Wash Face and genitals (private parts)  with your normal  soap.  6. Wash thoroughly, paying special attention to the area where your surgery will be performed.  7. Thoroughly rinse your body with warm water from the neck down.  8. DO NOT shower/wash with your normal soap after using and rinsing off the CHG Soap.  9. Pat yourself dry with a CLEAN TOWEL.  10. Wear CLEAN PAJAMAS to bed the night before surgery, wear comfortable clothes the morning of surgery  11. Place CLEAN SHEETS on your bed the night of your first shower and DO NOT SLEEP WITH PETS.    Day of Surgery: Do not apply any deodorants/lotions.  Please wear clean clothes to the hospital/surgery center.      Please read over the following fact sheets that you were given.

## 2017-03-28 NOTE — Progress Notes (Signed)
PCP / Cardio is Dr. Terrence Dupont. LOV 12/2016. She sees him every 3 mths.  Was told she had murmur, denies any cp, sob presently. Echo in 2014, Cardiac cath in 2016  Also has hx of ischemic colitis, and during one of her flareups, she went into A-Fib, but came right out of it. (have requested from Dr. Zenia Resides last office visit notes, most recent cardiac testing results)  Did have lumpectomy for cancer in 2000.  Sees Dr. Beryle Beams  [place] 12/2016.  Had Chemo & Radiation back then.  None presently GI is Dr. Cristina Gong , dx with ischemic colitis in 2016.  Last flare up was 01/2017 H/o OSA, tested in 2014 (report in epic), does wear CPAP, and believes the setting is 11.

## 2017-03-29 NOTE — Progress Notes (Signed)
Anesthesia Chart Review:  Pt is an 83 year old female scheduled for L total knee arthroplasty on 04/09/2017 with Edmonia Lynch, MD  - PCP/cardiologist is Charolette Forward, MD. Last office visit 01/01/17.   PMH includes:  HTN, heart murmur (1/6 systolic murmur by Dr. Zenia Resides note 01/01/17), intermittent atrial fibrillation, OSA, post-op N/V. Former smoker.   Medications include: Amlodipine, ASA 81 mg, Lipitor, losartan, metoprolol, Prilosec  BP (!) 166/88   Pulse 87   Temp 36.8 C (Oral)   Resp 20   LMP 07/22/2015   SpO2 100%    Preoperative labs reviewed.    EKG 03/28/17: NSR  Cardiac cath 11/25/14:  - Normal coronary arteries  Echo 04/10/12:  - Left ventricle: The cavity size was normal. Systolicfunction was normal. The estimated ejection fraction wasin the range of 55% to 60%. Wall motion was normal; therewere no regional wall motion abnormalities. Dopplerparameters are consistent with abnormal left ventricularrelaxation (grade 1 diastolic dysfunction). - Mitral valve: Calcified annulus. Mildly thickened leaflets. - Atrial septum: No defect or patent foramen ovale wasidentified.  If no changes, I anticipate pt can proceed with surgery as scheduled.   Willeen Cass, FNP-BC [facility]/Anesthesiology Phone: [phone] 03/29/2017 4:02 PM

## 2017-04-05 DIAGNOSIS — E785 Hyperlipidemia, unspecified: Secondary | ICD-10-CM | POA: Diagnosis not present

## 2017-04-05 DIAGNOSIS — K219 Gastro-esophageal reflux disease without esophagitis: Secondary | ICD-10-CM | POA: Diagnosis not present

## 2017-04-05 DIAGNOSIS — M1990 Unspecified osteoarthritis, unspecified site: Secondary | ICD-10-CM | POA: Diagnosis not present

## 2017-04-05 DIAGNOSIS — I480 Paroxysmal atrial fibrillation: Secondary | ICD-10-CM | POA: Diagnosis not present

## 2017-04-05 DIAGNOSIS — Z01810 Encounter for preprocedural cardiovascular examination: Secondary | ICD-10-CM | POA: Diagnosis not present

## 2017-04-05 DIAGNOSIS — D649 Anemia, unspecified: Secondary | ICD-10-CM | POA: Diagnosis not present

## 2017-04-05 DIAGNOSIS — G4733 Obstructive sleep apnea (adult) (pediatric): Secondary | ICD-10-CM | POA: Diagnosis not present

## 2017-04-05 DIAGNOSIS — I10 Essential (primary) hypertension: Secondary | ICD-10-CM | POA: Diagnosis not present

## 2017-04-05 DIAGNOSIS — F419 Anxiety disorder, unspecified: Secondary | ICD-10-CM | POA: Diagnosis not present

## 2017-04-05 DIAGNOSIS — I2510 Atherosclerotic heart disease of native coronary artery without angina pectoris: Secondary | ICD-10-CM | POA: Diagnosis not present

## 2017-04-08 MED ORDER — ACETAMINOPHEN 500 MG PO TABS
1000.0000 mg | ORAL_TABLET | Freq: Once | ORAL | Status: DC
  Filled 2017-04-08: qty 2

## 2017-04-08 MED ORDER — BUPIVACAINE LIPOSOME 1.3 % IJ SUSP
20.0000 mL | INTRAMUSCULAR | Status: AC
  Administered 2017-04-09: 20 mL
  Filled 2017-04-08: qty 20

## 2017-04-08 MED ORDER — SODIUM CHLORIDE 0.9 % IV SOLN
1000.0000 mg | INTRAVENOUS | Status: AC
  Administered 2017-04-09: 1000 mg via INTRAVENOUS
  Filled 2017-04-08: qty 1100

## 2017-04-08 MED ORDER — GABAPENTIN 300 MG PO CAPS
300.0000 mg | ORAL_CAPSULE | Freq: Once | ORAL | Status: AC
  Administered 2017-04-09: 300 mg via ORAL
  Filled 2017-04-08: qty 1

## 2017-04-09 ENCOUNTER — Encounter (HOSPITAL_COMMUNITY)
Admission: RE | Disposition: A | Discharge status: Home / Self Care | Payer: Self-pay | Source: Ambulatory Visit | Attending: Orthopedic Surgery

## 2017-04-09 ENCOUNTER — Inpatient Hospital Stay (HOSPITAL_COMMUNITY): Payer: PPO | Admitting: Emergency Medicine

## 2017-04-09 ENCOUNTER — Encounter (HOSPITAL_COMMUNITY): Payer: Self-pay

## 2017-04-09 ENCOUNTER — Inpatient Hospital Stay (HOSPITAL_COMMUNITY): Payer: PPO | Admitting: Certified Registered"

## 2017-04-09 ENCOUNTER — Observation Stay (HOSPITAL_COMMUNITY): Payer: PPO

## 2017-04-09 ENCOUNTER — Other Ambulatory Visit: Payer: Self-pay

## 2017-04-09 ENCOUNTER — Observation Stay (HOSPITAL_COMMUNITY)
Admission: RE | Admit: 2017-04-09 | Discharge: 2017-04-10 | Disposition: A | Discharge status: Home / Self Care | Payer: PPO | Source: Ambulatory Visit | Attending: Orthopedic Surgery | Admitting: Orthopedic Surgery

## 2017-04-09 DIAGNOSIS — Z885 Allergy status to narcotic agent status: Secondary | ICD-10-CM | POA: Insufficient documentation

## 2017-04-09 DIAGNOSIS — I4891 Unspecified atrial fibrillation: Secondary | ICD-10-CM | POA: Insufficient documentation

## 2017-04-09 DIAGNOSIS — Z853 Personal history of malignant neoplasm of breast: Secondary | ICD-10-CM | POA: Diagnosis not present

## 2017-04-09 DIAGNOSIS — I252 Old myocardial infarction: Secondary | ICD-10-CM | POA: Insufficient documentation

## 2017-04-09 DIAGNOSIS — Z7982 Long term (current) use of aspirin: Secondary | ICD-10-CM | POA: Diagnosis not present

## 2017-04-09 DIAGNOSIS — M1711 Unilateral primary osteoarthritis, right knee: Secondary | ICD-10-CM | POA: Diagnosis present

## 2017-04-09 DIAGNOSIS — Z79899 Other long term (current) drug therapy: Secondary | ICD-10-CM | POA: Insufficient documentation

## 2017-04-09 DIAGNOSIS — M179 Osteoarthritis of knee, unspecified: Secondary | ICD-10-CM | POA: Diagnosis not present

## 2017-04-09 DIAGNOSIS — G4733 Obstructive sleep apnea (adult) (pediatric): Secondary | ICD-10-CM | POA: Diagnosis present

## 2017-04-09 DIAGNOSIS — Z7901 Long term (current) use of anticoagulants: Secondary | ICD-10-CM | POA: Insufficient documentation

## 2017-04-09 DIAGNOSIS — K559 Vascular disorder of intestine, unspecified: Secondary | ICD-10-CM | POA: Diagnosis present

## 2017-04-09 DIAGNOSIS — K219 Gastro-esophageal reflux disease without esophagitis: Secondary | ICD-10-CM | POA: Diagnosis not present

## 2017-04-09 DIAGNOSIS — Z880 Allergy status to penicillin: Secondary | ICD-10-CM | POA: Diagnosis not present

## 2017-04-09 DIAGNOSIS — I10 Essential (primary) hypertension: Secondary | ICD-10-CM | POA: Diagnosis not present

## 2017-04-09 DIAGNOSIS — M1712 Unilateral primary osteoarthritis, left knee: Principal | ICD-10-CM | POA: Diagnosis present

## 2017-04-09 DIAGNOSIS — M1710 Unilateral primary osteoarthritis, unspecified knee: Secondary | ICD-10-CM | POA: Diagnosis present

## 2017-04-09 DIAGNOSIS — G8918 Other acute postprocedural pain: Secondary | ICD-10-CM | POA: Diagnosis not present

## 2017-04-09 DIAGNOSIS — E7800 Pure hypercholesterolemia, unspecified: Secondary | ICD-10-CM | POA: Insufficient documentation

## 2017-04-09 DIAGNOSIS — D649 Anemia, unspecified: Secondary | ICD-10-CM | POA: Diagnosis not present

## 2017-04-09 DIAGNOSIS — Z96659 Presence of unspecified artificial knee joint: Secondary | ICD-10-CM

## 2017-04-09 HISTORY — DX: Obstructive sleep apnea (adult) (pediatric): G47.33

## 2017-04-09 HISTORY — DX: Dependence on other enabling machines and devices: Z99.89

## 2017-04-09 HISTORY — DX: Malignant neoplasm of unspecified site of right female breast: C50.911

## 2017-04-09 HISTORY — PX: TOTAL KNEE ARTHROPLASTY: SHX125

## 2017-04-09 SURGERY — ARTHROPLASTY, KNEE, TOTAL
Anesthesia: General | Site: Knee | Laterality: Left

## 2017-04-09 MED ORDER — OXYCODONE HCL 5 MG PO TABS: 5.0000 mg | ORAL_TABLET | Freq: Four times a day (QID) | ORAL | 0 refills | Status: AC | PRN

## 2017-04-09 MED ORDER — CLINDAMYCIN PHOSPHATE 600 MG/50ML IV SOLN
600.0000 mg | Freq: Four times a day (QID) | INTRAVENOUS | Status: AC
  Administered 2017-04-09 (×2): 600 mg via INTRAVENOUS
  Filled 2017-04-09 (×2): qty 50

## 2017-04-09 MED ORDER — ACETAMINOPHEN 650 MG RE SUPP: 650.0000 mg | RECTAL | Status: DC | PRN

## 2017-04-09 MED ORDER — METHOCARBAMOL 1000 MG/10ML IJ SOLN: 500.0000 mg | Freq: Four times a day (QID) | INTRAVENOUS | Status: DC | PRN

## 2017-04-09 MED ORDER — LIDOCAINE 2% (20 MG/ML) 5 ML SYRINGE
INTRAMUSCULAR | Status: DC | PRN
  Administered 2017-04-09: 80 mg via INTRAVENOUS

## 2017-04-09 MED ORDER — PROPOFOL 1000 MG/100ML IV EMUL
INTRAVENOUS | Status: AC
  Filled 2017-04-09: qty 100

## 2017-04-09 MED ORDER — DOCUSATE SODIUM 100 MG PO CAPS: 100.0000 mg | ORAL_CAPSULE | Freq: Two times a day (BID) | ORAL | 0 refills | Status: DC

## 2017-04-09 MED ORDER — MIDAZOLAM HCL 2 MG/2ML IJ SOLN
INTRAMUSCULAR | Status: AC
  Filled 2017-04-09: qty 2

## 2017-04-09 MED ORDER — ROCURONIUM BROMIDE 10 MG/ML (PF) SYRINGE
PREFILLED_SYRINGE | INTRAVENOUS | Status: AC
  Filled 2017-04-09: qty 5

## 2017-04-09 MED ORDER — SUCCINYLCHOLINE CHLORIDE 200 MG/10ML IV SOSY
PREFILLED_SYRINGE | INTRAVENOUS | Status: AC
  Filled 2017-04-09: qty 10

## 2017-04-09 MED ORDER — ACETAMINOPHEN 500 MG PO TABS
1000.0000 mg | ORAL_TABLET | Freq: Three times a day (TID) | ORAL | Status: DC
  Administered 2017-04-09 – 2017-04-10 (×4): 1000 mg via ORAL
  Filled 2017-04-09 (×4): qty 2

## 2017-04-09 MED ORDER — 0.9 % SODIUM CHLORIDE (POUR BTL) OPTIME
TOPICAL | Status: DC | PRN
  Administered 2017-04-09: 1000 mL

## 2017-04-09 MED ORDER — PHENOL 1.4 % MT LIQD: 1.0000 | OROMUCOSAL | Status: DC | PRN

## 2017-04-09 MED ORDER — ONDANSETRON HCL 4 MG/2ML IJ SOLN
INTRAMUSCULAR | Status: AC
  Filled 2017-04-09: qty 2

## 2017-04-09 MED ORDER — SORBITOL 70 % SOLN: 30.0000 mL | Freq: Every day | Status: DC | PRN

## 2017-04-09 MED ORDER — EPHEDRINE SULFATE-NACL 50-0.9 MG/10ML-% IV SOSY
PREFILLED_SYRINGE | INTRAVENOUS | Status: DC | PRN
  Administered 2017-04-09 (×2): 10 mg via INTRAVENOUS

## 2017-04-09 MED ORDER — PROPOFOL 10 MG/ML IV BOLUS
INTRAVENOUS | Status: AC
  Filled 2017-04-09: qty 20

## 2017-04-09 MED ORDER — SENNA 8.6 MG PO TABS
1.0000 | ORAL_TABLET | Freq: Two times a day (BID) | ORAL | Status: DC
  Administered 2017-04-09 – 2017-04-10 (×3): 8.6 mg via ORAL
  Filled 2017-04-09 (×3): qty 1

## 2017-04-09 MED ORDER — METOPROLOL SUCCINATE ER 50 MG PO TB24
50.0000 mg | ORAL_TABLET | Freq: Every day | ORAL | Status: DC
  Administered 2017-04-09: 50 mg via ORAL
  Filled 2017-04-09: qty 1

## 2017-04-09 MED ORDER — POLYETHYLENE GLYCOL 3350 17 G PO PACK: 17.0000 g | PACK | Freq: Every day | ORAL | Status: DC | PRN

## 2017-04-09 MED ORDER — DOCUSATE SODIUM 100 MG PO CAPS
100.0000 mg | ORAL_CAPSULE | Freq: Two times a day (BID) | ORAL | Status: DC
  Administered 2017-04-09 – 2017-04-10 (×3): 100 mg via ORAL
  Filled 2017-04-09 (×3): qty 1

## 2017-04-09 MED ORDER — ONDANSETRON HCL 4 MG PO TABS: 4.0000 mg | ORAL_TABLET | Freq: Four times a day (QID) | ORAL | Status: DC | PRN

## 2017-04-09 MED ORDER — METOCLOPRAMIDE HCL 5 MG/ML IJ SOLN: 5.0000 mg | Freq: Three times a day (TID) | INTRAMUSCULAR | Status: DC | PRN

## 2017-04-09 MED ORDER — ROCURONIUM BROMIDE 10 MG/ML (PF) SYRINGE
PREFILLED_SYRINGE | INTRAVENOUS | Status: DC | PRN
  Administered 2017-04-09: 10 mg via INTRAVENOUS
  Administered 2017-04-09: 30 mg via INTRAVENOUS

## 2017-04-09 MED ORDER — DEXAMETHASONE SODIUM PHOSPHATE 10 MG/ML IJ SOLN
INTRAMUSCULAR | Status: AC
  Filled 2017-04-09: qty 1

## 2017-04-09 MED ORDER — MENTHOL 3 MG MT LOZG: 1.0000 | LOZENGE | OROMUCOSAL | Status: DC | PRN

## 2017-04-09 MED ORDER — LOSARTAN POTASSIUM 50 MG PO TABS
100.0000 mg | ORAL_TABLET | Freq: Every day | ORAL | Status: DC
  Administered 2017-04-09 – 2017-04-10 (×2): 100 mg via ORAL
  Filled 2017-04-09 (×2): qty 2

## 2017-04-09 MED ORDER — PANTOPRAZOLE SODIUM 40 MG PO TBEC
40.0000 mg | DELAYED_RELEASE_TABLET | Freq: Every day | ORAL | Status: DC
  Administered 2017-04-10: 40 mg via ORAL
  Filled 2017-04-09: qty 1

## 2017-04-09 MED ORDER — SODIUM CHLORIDE FLUSH 0.9 % IV SOLN
INTRAVENOUS | Status: DC | PRN
  Administered 2017-04-09: 30 mL

## 2017-04-09 MED ORDER — HYDROMORPHONE HCL 1 MG/ML IJ SOLN: 0.5000 mg | INTRAMUSCULAR | Status: DC | PRN

## 2017-04-09 MED ORDER — DIPHENHYDRAMINE HCL 12.5 MG/5ML PO ELIX: 12.5000 mg | ORAL_SOLUTION | ORAL | Status: DC | PRN

## 2017-04-09 MED ORDER — ONDANSETRON HCL 4 MG/2ML IJ SOLN
INTRAMUSCULAR | Status: DC | PRN
  Administered 2017-04-09: 4 mg via INTRAVENOUS

## 2017-04-09 MED ORDER — SUCCINYLCHOLINE CHLORIDE 200 MG/10ML IV SOSY
PREFILLED_SYRINGE | INTRAVENOUS | Status: DC | PRN
  Administered 2017-04-09: 100 mg via INTRAVENOUS

## 2017-04-09 MED ORDER — DEXAMETHASONE SODIUM PHOSPHATE 10 MG/ML IJ SOLN
INTRAMUSCULAR | Status: DC | PRN
  Administered 2017-04-09: 5 mg via INTRAVENOUS

## 2017-04-09 MED ORDER — DEXAMETHASONE SODIUM PHOSPHATE 10 MG/ML IJ SOLN
10.0000 mg | Freq: Once | INTRAMUSCULAR | Status: AC
  Administered 2017-04-10: 10 mg via INTRAVENOUS
  Filled 2017-04-09: qty 1

## 2017-04-09 MED ORDER — OXYCODONE HCL 5 MG PO TABS: 5.0000 mg | ORAL_TABLET | ORAL | Status: DC | PRN

## 2017-04-09 MED ORDER — SODIUM CHLORIDE 0.9 % IR SOLN
Status: DC | PRN
  Administered 2017-04-09: 3000 mL

## 2017-04-09 MED ORDER — APIXABAN 2.5 MG PO TABS
2.5000 mg | ORAL_TABLET | Freq: Two times a day (BID) | ORAL | Status: DC
  Administered 2017-04-10: 2.5 mg via ORAL
  Filled 2017-04-09: qty 1

## 2017-04-09 MED ORDER — CLINDAMYCIN PHOSPHATE 900 MG/50ML IV SOLN
INTRAVENOUS | Status: AC
  Filled 2017-04-09: qty 50

## 2017-04-09 MED ORDER — BACLOFEN 10 MG PO TABS: 10.0000 mg | ORAL_TABLET | Freq: Three times a day (TID) | ORAL | 0 refills | Status: DC | PRN

## 2017-04-09 MED ORDER — CHLORHEXIDINE GLUCONATE 4 % EX LIQD: 60.0000 mL | Freq: Once | CUTANEOUS | Status: DC

## 2017-04-09 MED ORDER — METOPROLOL SUCCINATE ER 25 MG PO TB24
25.0000 mg | ORAL_TABLET | Freq: Every day | ORAL | Status: DC
  Administered 2017-04-10: 25 mg via ORAL
  Filled 2017-04-09: qty 1

## 2017-04-09 MED ORDER — PROPOFOL 10 MG/ML IV BOLUS
INTRAVENOUS | Status: DC | PRN
  Administered 2017-04-09: 150 mg via INTRAVENOUS

## 2017-04-09 MED ORDER — APIXABAN 2.5 MG PO TABS: 2.5000 mg | ORAL_TABLET | Freq: Two times a day (BID) | ORAL | 0 refills | Status: DC

## 2017-04-09 MED ORDER — SUGAMMADEX SODIUM 200 MG/2ML IV SOLN
INTRAVENOUS | Status: AC
  Filled 2017-04-09: qty 2

## 2017-04-09 MED ORDER — LIDOCAINE 2% (20 MG/ML) 5 ML SYRINGE
INTRAMUSCULAR | Status: AC
  Filled 2017-04-09: qty 5

## 2017-04-09 MED ORDER — ACETAMINOPHEN 500 MG PO TABS: 1000.0000 mg | ORAL_TABLET | Freq: Three times a day (TID) | ORAL | 0 refills | Status: AC

## 2017-04-09 MED ORDER — OXYCODONE HCL 5 MG PO TABS: 10.0000 mg | ORAL_TABLET | ORAL | Status: DC | PRN

## 2017-04-09 MED ORDER — ONDANSETRON HCL 4 MG PO TABS: 4.0000 mg | ORAL_TABLET | Freq: Three times a day (TID) | ORAL | 0 refills | Status: DC | PRN

## 2017-04-09 MED ORDER — LACTATED RINGERS IV SOLN
INTRAVENOUS | Status: DC
  Administered 2017-04-09 (×2): via INTRAVENOUS

## 2017-04-09 MED ORDER — METHOCARBAMOL 500 MG PO TABS
500.0000 mg | ORAL_TABLET | Freq: Four times a day (QID) | ORAL | Status: DC | PRN
  Filled 2017-04-09: qty 1

## 2017-04-09 MED ORDER — AMLODIPINE BESYLATE 2.5 MG PO TABS
2.5000 mg | ORAL_TABLET | Freq: Every day | ORAL | Status: DC
  Administered 2017-04-09: 2.5 mg via ORAL
  Filled 2017-04-09: qty 1

## 2017-04-09 MED ORDER — SUGAMMADEX SODIUM 200 MG/2ML IV SOLN
INTRAVENOUS | Status: DC | PRN
  Administered 2017-04-09: 145.2 mg via INTRAVENOUS

## 2017-04-09 MED ORDER — CELECOXIB 200 MG PO CAPS
200.0000 mg | ORAL_CAPSULE | Freq: Two times a day (BID) | ORAL | Status: DC
  Administered 2017-04-09 (×2): 200 mg via ORAL
  Filled 2017-04-09 (×2): qty 1

## 2017-04-09 MED ORDER — FENTANYL CITRATE (PF) 100 MCG/2ML IJ SOLN
INTRAMUSCULAR | Status: DC | PRN
  Administered 2017-04-09: 25 ug via INTRAVENOUS
  Administered 2017-04-09: 50 ug via INTRAVENOUS
  Administered 2017-04-09: 25 ug via INTRAVENOUS

## 2017-04-09 MED ORDER — PHENYLEPHRINE 40 MCG/ML (10ML) SYRINGE FOR IV PUSH (FOR BLOOD PRESSURE SUPPORT)
PREFILLED_SYRINGE | INTRAVENOUS | Status: AC
  Filled 2017-04-09: qty 10

## 2017-04-09 MED ORDER — PHENYLEPHRINE 40 MCG/ML (10ML) SYRINGE FOR IV PUSH (FOR BLOOD PRESSURE SUPPORT)
PREFILLED_SYRINGE | INTRAVENOUS | Status: DC | PRN
  Administered 2017-04-09: 80 ug via INTRAVENOUS

## 2017-04-09 MED ORDER — FENTANYL CITRATE (PF) 100 MCG/2ML IJ SOLN
INTRAMUSCULAR | Status: AC
  Filled 2017-04-09: qty 2

## 2017-04-09 MED ORDER — ACETAMINOPHEN 325 MG PO TABS
650.0000 mg | ORAL_TABLET | ORAL | Status: DC | PRN
Start: 2017-04-09 — End: 2017-04-10

## 2017-04-09 MED ORDER — OXYBUTYNIN CHLORIDE 5 MG PO TABS
5.0000 mg | ORAL_TABLET | Freq: Three times a day (TID) | ORAL | Status: DC
  Administered 2017-04-09 – 2017-04-10 (×3): 5 mg via ORAL
  Filled 2017-04-09 (×3): qty 1

## 2017-04-09 MED ORDER — FENTANYL CITRATE (PF) 250 MCG/5ML IJ SOLN
INTRAMUSCULAR | Status: AC
  Filled 2017-04-09: qty 5

## 2017-04-09 MED ORDER — METOCLOPRAMIDE HCL 5 MG PO TABS: 5.0000 mg | ORAL_TABLET | Freq: Three times a day (TID) | ORAL | Status: DC | PRN

## 2017-04-09 MED ORDER — ONDANSETRON HCL 4 MG/2ML IJ SOLN: 4.0000 mg | Freq: Four times a day (QID) | INTRAMUSCULAR | Status: DC | PRN

## 2017-04-09 MED ORDER — FENTANYL CITRATE (PF) 100 MCG/2ML IJ SOLN
25.0000 ug | INTRAMUSCULAR | Status: DC | PRN
  Administered 2017-04-09 (×3): 25 ug via INTRAVENOUS

## 2017-04-09 MED ORDER — KETOROLAC TROMETHAMINE 30 MG/ML IJ SOLN
INTRAMUSCULAR | Status: AC
  Filled 2017-04-09: qty 1

## 2017-04-09 MED ORDER — ASPIRIN 81 MG PO TBEC: 81.0000 mg | DELAYED_RELEASE_TABLET | Freq: Every day | ORAL | 12 refills | Status: DC

## 2017-04-09 MED ORDER — LACTATED RINGERS IV SOLN
INTRAVENOUS | Status: DC
  Administered 2017-04-09 (×2): via INTRAVENOUS

## 2017-04-09 MED ORDER — CLINDAMYCIN PHOSPHATE 900 MG/50ML IV SOLN
INTRAVENOUS | Status: DC | PRN
  Administered 2017-04-09: 900 mg via INTRAVENOUS

## 2017-04-09 SURGICAL SUPPLY — 56 items
BANDAGE ESMARK 6X9 LF (GAUZE/BANDAGES/DRESSINGS) ×1 IMPLANT
BLADE SAG 18X100X1.27 (BLADE) ×6 IMPLANT
BNDG CMPR 9X6 STRL LF SNTH (GAUZE/BANDAGES/DRESSINGS) ×1
BNDG CMPR MED 10X6 ELC LF (GAUZE/BANDAGES/DRESSINGS) ×1
BNDG CMPR MED 15X6 ELC VLCR LF (GAUZE/BANDAGES/DRESSINGS) ×1
BNDG ELASTIC 6X10 VLCR STRL LF (GAUZE/BANDAGES/DRESSINGS) ×3 IMPLANT
BNDG ELASTIC 6X15 VLCR STRL LF (GAUZE/BANDAGES/DRESSINGS) ×3 IMPLANT
BNDG ESMARK 6X9 LF (GAUZE/BANDAGES/DRESSINGS) ×3
BOWL SMART MIX CTS (DISPOSABLE) ×3 IMPLANT
CAPT KNEE TRIATH TK-4 ×2 IMPLANT
CLOSURE STERI-STRIP 1/2X4 (GAUZE/BANDAGES/DRESSINGS) ×1
CLSR STERI-STRIP ANTIMIC 1/2X4 (GAUZE/BANDAGES/DRESSINGS) ×3 IMPLANT
COVER SURGICAL LIGHT HANDLE (MISCELLANEOUS) ×3 IMPLANT
CUFF TOURNIQUET SINGLE 34IN LL (TOURNIQUET CUFF) ×3 IMPLANT
DRAPE EXTREMITY T 121X128X90 (DRAPE) ×3 IMPLANT
DRAPE HALF SHEET 40X57 (DRAPES) ×3 IMPLANT
DRAPE U-SHAPE 47X51 STRL (DRAPES) ×3 IMPLANT
DRSG MEPILEX BORDER 4X12 (GAUZE/BANDAGES/DRESSINGS) ×3 IMPLANT
DRSG MEPILEX BORDER 4X8 (GAUZE/BANDAGES/DRESSINGS) ×3 IMPLANT
DURAPREP 26ML APPLICATOR (WOUND CARE) ×6 IMPLANT
ELECT CAUTERY BLADE 6.4 (BLADE) ×3 IMPLANT
ELECT REM PT RETURN 9FT ADLT (ELECTROSURGICAL) ×3
ELECTRODE REM PT RTRN 9FT ADLT (ELECTROSURGICAL) ×1 IMPLANT
FACESHIELD WRAPAROUND (MASK) ×6 IMPLANT
FACESHIELD WRAPAROUND OR TEAM (MASK) ×2 IMPLANT
GLOVE BIO SURGEON STRL SZ7.5 (GLOVE) ×6 IMPLANT
GLOVE BIOGEL PI IND STRL 8 (GLOVE) ×2 IMPLANT
GLOVE BIOGEL PI INDICATOR 8 (GLOVE) ×4
GOWN STRL REUS W/ TWL LRG LVL3 (GOWN DISPOSABLE) ×2 IMPLANT
GOWN STRL REUS W/ TWL XL LVL3 (GOWN DISPOSABLE) ×1 IMPLANT
GOWN STRL REUS W/TWL LRG LVL3 (GOWN DISPOSABLE) ×6
GOWN STRL REUS W/TWL XL LVL3 (GOWN DISPOSABLE) ×3
HANDPIECE INTERPULSE COAX TIP (DISPOSABLE)
IMMOBILIZER KNEE 22 (SOFTGOODS) ×2 IMPLANT
IMMOBILIZER KNEE 22 UNIV (SOFTGOODS) ×3 IMPLANT
KIT BASIN OR (CUSTOM PROCEDURE TRAY) ×3 IMPLANT
KIT ROOM TURNOVER OR (KITS) ×3 IMPLANT
MANIFOLD NEPTUNE II (INSTRUMENTS) ×3 IMPLANT
NEEDLE 22X1 1/2 (OR ONLY) (NEEDLE) ×3 IMPLANT
NS IRRIG 1000ML POUR BTL (IV SOLUTION) ×3 IMPLANT
PACK TOTAL JOINT (CUSTOM PROCEDURE TRAY) ×3 IMPLANT
PAD ARMBOARD 7.5X6 YLW CONV (MISCELLANEOUS) ×3 IMPLANT
SET HNDPC FAN SPRY TIP SCT (DISPOSABLE) IMPLANT
SUCTION FRAZIER HANDLE 10FR (MISCELLANEOUS) ×2
SUCTION TUBE FRAZIER 10FR DISP (MISCELLANEOUS) ×1 IMPLANT
SUT MNCRL AB 4-0 PS2 18 (SUTURE) ×3 IMPLANT
SUT MON AB 2-0 CT1 27 (SUTURE) ×3 IMPLANT
SUT VIC AB 0 CT1 27 (SUTURE) ×3
SUT VIC AB 0 CT1 27XBRD ANBCTR (SUTURE) ×1 IMPLANT
SUT VIC AB 1 CT1 27 (SUTURE) ×6
SUT VIC AB 1 CT1 27XBRD ANBCTR (SUTURE) ×2 IMPLANT
SYR 50ML LL SCALE MARK (SYRINGE) ×3 IMPLANT
TOWEL OR 17X24 6PK STRL BLUE (TOWEL DISPOSABLE) ×3 IMPLANT
TOWEL OR 17X26 10 PK STRL BLUE (TOWEL DISPOSABLE) ×3 IMPLANT
TRAY CATH 16FR W/PLASTIC CATH (SET/KITS/TRAYS/PACK) IMPLANT
TRAY FOLEY BAG SILVER LF 14FR (CATHETERS) IMPLANT

## 2017-04-09 NOTE — Anesthesia Procedure Notes (Addendum)
Anesthesia Regional Block: Adductor canal block   Pre-Anesthetic Checklist: ,, timeout performed, Correct Patient, Correct Site, Correct Laterality, Correct Procedure, Correct Position, site marked, Risks and benefits discussed,  Surgical consent,  Pre-op evaluation,  At surgeon's request and post-op pain management  Laterality: Left     Needles:  Injection technique: Single-shot  Needle Type: Stimulator Needle - 80          Additional Needles:   Procedures: Doppler guided,,,, ultrasound used (permanent image in chart),,,,  Narrative:  Start time: 04/09/2017 9:10 AM End time: 04/09/2017 9:25 AM Injection made incrementally with aspirations every 5 mL.  Performed by: Personally  Anesthesiologist: Belinda Block, MD

## 2017-04-09 NOTE — Anesthesia Postprocedure Evaluation (Signed)
Anesthesia Post Note  Patient: Wanda Green  Procedure(s) Performed: LEFT TOTAL KNEE ARTHROPLASTY (Left Knee)     Patient location during evaluation: PACU Anesthesia Type: General Level of consciousness: awake Pain management: pain level controlled Vital Signs Assessment: post-procedure vital signs reviewed and stable Respiratory status: spontaneous breathing Cardiovascular status: stable Anesthetic complications: no    Last Vitals:  Vitals:   04/09/17 1108 04/09/17 1115  BP: 138/69   Pulse: 78   Resp: 15   Temp:  (!) 36.3 C  SpO2: 99%     Last Pain:  Vitals:   04/09/17 1100  TempSrc:   PainSc: 4                  Naidelin Gugliotta

## 2017-04-09 NOTE — Anesthesia Preprocedure Evaluation (Signed)
Anesthesia Evaluation  Patient identified by MRN, date of birth, ID band Patient awake    Reviewed: Allergy & Precautions, NPO status , Patient's Chart, lab work & pertinent test results  History of Anesthesia Complications (+) PONV and PSEUDOCHOLINESTERASE DEFICIENCY  Airway Mallampati: II  TM Distance: >3 FB     Dental   Pulmonary sleep apnea , former smoker,    breath sounds clear to auscultation       Cardiovascular hypertension, + Past MI  + dysrhythmias + Valvular Problems/Murmurs  Rhythm:Regular Rate:Normal     Neuro/Psych    GI/Hepatic Neg liver ROS, GERD  ,  Endo/Other  negative endocrine ROS  Renal/GU negative Renal ROS     Musculoskeletal  (+) Arthritis ,   Abdominal   Peds  Hematology  (+) anemia ,   Anesthesia Other Findings   Reproductive/Obstetrics                             Anesthesia Physical Anesthesia Plan  ASA: III  Anesthesia Plan: General   Post-op Pain Management:    Induction: Intravenous  PONV Risk Score and Plan: Treatment may vary due to age or medical condition, Ondansetron, Dexamethasone, Midazolam and Propofol infusion  Airway Management Planned: Oral ETT  Additional Equipment:   Intra-op Plan:   Post-operative Plan: Extubation in OR  Informed Consent: I have reviewed the patients History and Physical, chart, labs and discussed the procedure including the risks, benefits and alternatives for the proposed anesthesia with the patient or authorized representative who has indicated his/her understanding and acceptance.   Dental advisory given  Plan Discussed with: CRNA and Anesthesiologist  Anesthesia Plan Comments:         Anesthesia Quick Evaluation

## 2017-04-09 NOTE — Progress Notes (Signed)
Orthopedic Tech Progress Note Patient Details:  Wanda Green 1934/05/17 [id number]  CPM Left Knee CPM Left Knee: On Left Knee Flexion (Degrees): 90 Left Knee Extension (Degrees): 0 Additional Comments: Foot roll  Post Interventions Patient Tolerated: Well Instructions Provided: Care of device, Adjustment of device  Wanda Green 04/09/2017, 11:07 AM

## 2017-04-09 NOTE — Transfer of Care (Signed)
Immediate Anesthesia Transfer of Care Note  Patient: Wanda Green  Procedure(s) Performed: LEFT TOTAL KNEE ARTHROPLASTY (Left Knee)  Patient Location: PACU  Anesthesia Type:General  Level of Consciousness: drowsy and patient cooperative  Airway & Oxygen Therapy: Patient Spontanous Breathing and Patient connected to face mask oxygen  Post-op Assessment: Report given to RN and Post -op Vital signs reviewed and stable  Post vital signs: Reviewed and stable  Last Vitals:  Vitals:   04/09/17 0610  BP: (!) 171/79  Pulse: 74  Resp: 18  Temp: 36.8 C  SpO2: 100%    Last Pain:  Vitals:   04/09/17 0655  TempSrc:   PainSc: 7          Complications: No apparent anesthesia complications

## 2017-04-09 NOTE — Evaluation (Signed)
Physical Therapy Evaluation Patient Details Name: Wanda Green MRN: [id number] DOB: 27-Apr-1934 Today's Date: 04/09/2017   History of Present Illness  Pt is an 83 y/o female s/p elective L TKA. PMH includes HTN, breast cancer, MI, and osteoporosis.   Clinical Impression  Pt is s/p surgery above with deficits below. Pt tolerated gait training well and required min guard assist. HEP initiated. Will continue to follow acutely to maximize functional mobility independence and safety.     Follow Up Recommendations DC plan and follow up therapy as arranged by surgeon;Supervision for mobility/OOB    Equipment Recommendations  Rolling walker with 5" wheels;3in1 (PT)    Recommendations for Other Services       Precautions / Restrictions Precautions Precautions: Knee Precaution Booklet Issued: Yes (comment) Precaution Comments: Reviewed supine ther ex with pt.  Restrictions Weight Bearing Restrictions: Yes LLE Weight Bearing: Weight bearing as tolerated      Mobility  Bed Mobility               General bed mobility comments: In chair upon entry   Transfers Overall transfer level: Needs assistance Equipment used: Rolling walker (2 wheeled) Transfers: Sit to/from Stand Sit to Stand: Min guard         General transfer comment: Min guard for safety. Increased time required. Cues for safe hand placement.   Ambulation/Gait Ambulation/Gait assistance: Min guard Ambulation Distance (Feet): 125 Feet Assistive device: Rolling walker (2 wheeled) Gait Pattern/deviations: Step-to pattern;Decreased step length - right;Decreased step length - left;Decreased weight shift to left;Antalgic Gait velocity: Decreased  Gait velocity interpretation: Below normal speed for age/gender General Gait Details: Slow, antalgic gait. Slight L knee buckling noted initially, however, improved control noted throughout gait. Cues for increasing step length and height for LLE as pt tended to drag LLE.  Verbal cues for sequencing using RW.   Stairs            Wheelchair Mobility    Modified Rankin (Stroke Patients Only)       Balance Overall balance assessment: Needs assistance Sitting-balance support: No upper extremity supported;Feet supported Sitting balance-Leahy Scale: Good     Standing balance support: Bilateral upper extremity supported;During functional activity Standing balance-Leahy Scale: Poor Standing balance comment: Reliant on BUE support                              Pertinent Vitals/Pain Pain Assessment: 0-10 Pain Score: 3  Pain Location: L knee  Pain Descriptors / Indicators: Aching;Operative site guarding;Sore Pain Intervention(s): Limited activity within patient's tolerance;Monitored during session;Repositioned    Home Living Family/patient expects to be discharged to:: Private residence Living Arrangements: Children Available Help at Discharge: Family;Available 24 hours/day Type of Home: House Home Access: Stairs to enter Entrance Stairs-Rails: None Entrance Stairs-Number of Steps: 3 Home Layout: Two level;Able to live on main level with bedroom/bathroom Home Equipment: Kasandra Knudsen - single point;Crutches      Prior Function Level of Independence: Independent with assistive device(s)         Comments: Used cane for ambulation      Hand Dominance   Dominant Hand: Right    Extremity/Trunk Assessment   Upper Extremity Assessment Upper Extremity Assessment: Defer to OT evaluation    Lower Extremity Assessment Lower Extremity Assessment: LLE deficits/detail LLE Deficits / Details: Some decreased sensation reported. Deficits consistent with post op pain and weakness. Able to perform ther ex below.     Cervical / Trunk  Assessment Cervical / Trunk Assessment: Normal  Communication   Communication: No difficulties  Cognition Arousal/Alertness: Awake/alert Behavior During Therapy: WFL for tasks assessed/performed Overall  Cognitive Status: Within Functional Limits for tasks assessed                                        General Comments      Exercises Total Joint Exercises Ankle Circles/Pumps: AROM;Both;20 reps Quad Sets: AROM;Left;10 reps Towel Squeeze: AROM;Left;10 reps Heel Slides: (verbal education to perform upon return to bed )   Assessment/Plan    PT Assessment Patient needs continued PT services  PT Problem List Decreased strength;Decreased range of motion;Decreased balance;Decreased mobility;Decreased knowledge of use of DME;Decreased knowledge of precautions;Pain;Impaired sensation       PT Treatment Interventions DME instruction;Gait training;Stair training;Functional mobility training;Therapeutic activities;Therapeutic exercise;Balance training;Neuromuscular re-education;Patient/family education    PT Goals (Current goals can be found in the Care Plan section)  Acute Rehab PT Goals Patient Stated Goal: to go home tomorrow  PT Goal Formulation: With patient Time For Goal Achievement: 04/23/17 Potential to Achieve Goals: Good    Frequency 7X/week   Barriers to discharge        Co-evaluation               AM-PAC PT "6 Clicks" Daily Activity  Outcome Measure Difficulty turning over in bed (including adjusting bedclothes, sheets and blankets)?: A Little Difficulty moving from lying on back to sitting on the side of the bed? : Unable Difficulty sitting down on and standing up from a chair with arms (e.g., wheelchair, bedside commode, etc,.)?: Unable Help needed moving to and from a bed to chair (including a wheelchair)?: A Little Help needed walking in hospital room?: A Little Help needed climbing 3-5 steps with a railing? : A Lot 6 Click Score: 13    End of Session Equipment Utilized During Treatment: Gait belt;Left knee immobilizer Activity Tolerance: Patient tolerated treatment well Patient left: in chair;with call bell/phone within reach Nurse  Communication: Mobility status PT Visit Diagnosis: Other abnormalities of gait and mobility (R26.89);Pain Pain - Right/Left: Left Pain - part of body: Knee    Time: 4098-1191 PT Time Calculation (min) (ACUTE ONLY): 25 min   Charges:   PT Evaluation $PT Eval Low Complexity: 1 Low PT Treatments $Gait Training: 8-22 mins   PT G Codes:        Leighton Ruff, PT, DPT  Acute Rehabilitation Services  Pager: [phone]   Rudean Hitt 04/09/2017, 6:05 PM

## 2017-04-09 NOTE — Op Note (Signed)
DATE OF SURGERY:  04/09/2017 TIME: 8:56 AM  PATIENT NAME:  Wanda Green   AGE: 83 y.o.    PRE-OPERATIVE DIAGNOSIS:  OA LEFT KNEE  POST-OPERATIVE DIAGNOSIS:  Same  PROCEDURE:  Procedure(s): LEFT TOTAL KNEE ARTHROPLASTY   SURGEON:  Leira Regino D, MD   ASSISTANT:  Roxan Hockey, PA-C, he was present and scrubbed throughout the case, critical for completion in a timely fashion, and for retraction, instrumentation, and closure.    OPERATIVE IMPLANTS: [employer] Triathlon Posterior Stabilized. Press fit knee  Femur size 4, Tibia size 4, Patella size 29 3-peg oval button, with a 9 mm polyethylene insert.   PREOPERATIVE INDICATIONS:  Wanda Green is a 83 y.o. year old female with end stage bone on bone degenerative arthritis of the knee who failed conservative treatment, including injections, antiinflammatories, activity modification, and assistive devices, and had significant impairment of their activities of daily living, and elected for Total Knee Arthroplasty.   The risks, benefits, and alternatives were discussed at length including but not limited to the risks of infection, bleeding, nerve injury, stiffness, blood clots, the need for revision surgery, cardiopulmonary complications, among others, and they were willing to proceed.   OPERATIVE DESCRIPTION:  The patient was brought to the operative room and placed in a supine position.  General anesthesia was administered.  IV antibiotics were given.  The lower extremity was prepped and draped in the usual sterile fashion.  Time out was performed.  The leg was elevated and exsanguinated and the tourniquet was inflated.  Anterior approach was performed.  The patella was everted and osteophytes were removed.  The anterior horn of the medial and lateral meniscus was removed.   The distal femur was opened with the drill and the intramedullary distal femoral cutting jig was utilized, set at 5 degrees resecting 10 mm off the distal  femur.  Care was taken to protect the collateral ligaments.  The distal femoral sizing jig was applied, taking care to avoid notching.  Then the 4-in-1 cutting jig was applied and the anterior and posterior femur was cut, along with the chamfer cuts.  All posterior osteophytes were removed.  The flexion gap was then measured and was symmetric with the extension gap.  Then the extramedullary tibial cutting jig was utilized making the appropriate cut using the anterior tibial crest as a reference building in appropriate posterior slope.  Care was taken during the cut to protect the medial and collateral ligaments.  The proximal tibia was removed along with the posterior horns of the menisci.  The PCL was sacrificed.    The extensor gap was measured and was approximately 75mm.    I completed the distal femoral preparation using the appropriate jig to prepare the box.  The patella was then measured, and cut with the saw.    The proximal tibia sized and prepared accordingly with the reamer and the punch, and then all components were trialed with the above sized poly insert.  The knee was found to have excellent balance and full motion.    The above named components were then impacted into place and Poly tibial piece and patella were inserted.  I was very happy with his stability and ROM  I performed a periarticular injection with marcaine and toradol  The knee was easily taken through a range of motion and the patella tracked well and the knee irrigated copiously and the parapatellar and subcutaneous tissue closed with vicryl, and monocryl with steri strips for the skin.  The incision was dressed with sterile gauze and the tourniquet released and the patient was awakened and returned to the PACU in stable and satisfactory condition.  There were no complications.  Total tourniquet time was roughly 60 minutes.   POSTOPERATIVE PLAN: post op Abx, DVT px: SCD's, TED's, Early ambulation and chemical  px

## 2017-04-09 NOTE — Interval H&P Note (Signed)
History and Physical Interval Note:  04/09/2017 7:17 AM  Wanda Green  has presented today for surgery, with the diagnosis of OA LEFT KNEE  The various methods of treatment have been discussed with the patient and family. After consideration of risks, benefits and other options for treatment, the patient has consented to  Procedure(s): LEFT TOTAL KNEE ARTHROPLASTY (Left) as a surgical intervention .  The patient's history has been reviewed, patient examined, no change in status, stable for surgery.  I have reviewed the patient's chart and labs.  Questions were answered to the patient's satisfaction.     [name]

## 2017-04-09 NOTE — Anesthesia Procedure Notes (Signed)
Procedure Name: Intubation Date/Time: 04/09/2017 8:05 AM Performed by: Freddie Breech, CRNA Pre-anesthesia Checklist: Patient identified, Emergency Drugs available, Suction available, Patient being monitored and Timeout performed Patient Re-evaluated:Patient Re-evaluated prior to induction Oxygen Delivery Method: Circle System Utilized Preoxygenation: Pre-oxygenation with 100% oxygen Induction Type: IV induction Ventilation: Mask ventilation without difficulty Laryngoscope Size: Miller and 2 Grade View: Grade I Tube type: Oral Number of attempts: 1 Airway Equipment and Method: Stylet and Oral airway Placement Confirmation: ETT inserted through vocal cords under direct vision,  positive ETCO2 and breath sounds checked- equal and bilateral Secured at: 22 (teeth) cm Tube secured with: Tape Dental Injury: Teeth and Oropharynx as per pre-operative assessment

## 2017-04-09 NOTE — Plan of Care (Signed)
  Nutrition: Adequate nutrition will be maintained 04/09/2017 1649 - Progressing by Williams Che, RN   Elimination: Will not experience complications related to bowel motility 04/09/2017 1649 - Progressing by Williams Che, RN   Pain Managment: General experience of comfort will improve 04/09/2017 1649 - Progressing by Williams Che, RN   Safety: Ability to remain free from injury will improve 04/09/2017 1649 - Progressing by Williams Che, RN

## 2017-04-10 ENCOUNTER — Encounter (HOSPITAL_COMMUNITY): Payer: Self-pay | Admitting: Orthopedic Surgery

## 2017-04-10 DIAGNOSIS — M1712 Unilateral primary osteoarthritis, left knee: Secondary | ICD-10-CM | POA: Diagnosis not present

## 2017-04-10 NOTE — Evaluation (Signed)
Occupational Therapy Evaluation and Discharge Patient Details Name: Wanda Green MRN: [id number] DOB: [birth year] Today's Date: 04/10/2017    History of Present Illness Pt is an 83 y/o female s/p elective L TKA. PMH includes HTN, breast cancer, MI, and osteoporosis.    Clinical Impression   Pt reports she was independent with ADL PTA. Currently pt overall supervision for ADL and functional mobility; required light min assist to get sock over L heel--pts daughter to assist as needed. All knee, safety, and ADL education complete. Pt planning to d/c home with 24/7 supervision from her daughter. No further acute OT needs identified; signing off at this time. Please re-consult if needs change. Thank you for this referral.    Follow Up Recommendations  No OT follow up;Supervision - Intermittent    Equipment Recommendations  3 in 1 bedside commode(for use as shower chair)    Recommendations for Other Services       Precautions / Restrictions Precautions Precautions: Knee Precaution Booklet Issued: No Precaution Comments: Educated on use of zero degree knee and no pillow under knee Restrictions Weight Bearing Restrictions: Yes LLE Weight Bearing: Weight bearing as tolerated      Mobility Bed Mobility Overal bed mobility: Modified Independent                Transfers Overall transfer level: Needs assistance Equipment used: Rolling walker (2 wheeled) Transfers: Sit to/from Stand Sit to Stand: Supervision         General transfer comment: for safety    Balance Overall balance assessment: Needs assistance Sitting-balance support: Feet supported;No upper extremity supported Sitting balance-Leahy Scale: Good     Standing balance support: Bilateral upper extremity supported Standing balance-Leahy Scale: Poor Standing balance comment: RW for support                           ADL either performed or assessed with clinical judgement   ADL Overall ADL's :  Needs assistance/impaired Eating/Feeding: Independent;Sitting   Grooming: Supervision/safety;Standing   Upper Body Bathing: Set up;Sitting   Lower Body Bathing: Supervison/ safety;Sit to/from stand   Upper Body Dressing : Set up;Sitting   Lower Body Dressing: Minimal assistance;Sit to/from stand Lower Body Dressing Details (indicate cue type and reason): Educated on compensatory strategies for LB ADL, min assist to get sock over heel on L foot Toilet Transfer: Supervision/safety;Ambulation;RW Toilet Transfer Details (indicate cue type and reason): Simulated with sit to stand and functional mobility       Tub/Shower Transfer Details (indicate cue type and reason): Educated on walk in shower transfer technique and use of shower chair for safety with bathing; pt agreeable Functional mobility during ADLs: Supervision/safety;Rolling walker       Vision         Perception     Praxis      Pertinent Vitals/Pain Pain Assessment: Faces Faces Pain Scale: Hurts a little bit Pain Location: L knee Pain Descriptors / Indicators: Discomfort Pain Intervention(s): Monitored during session;Repositioned;Ice applied     Hand Dominance Right   Extremity/Trunk Assessment Upper Extremity Assessment Upper Extremity Assessment: Overall WFL for tasks assessed   Lower Extremity Assessment Lower Extremity Assessment: Defer to PT evaluation   Cervical / Trunk Assessment Cervical / Trunk Assessment: Normal   Communication Communication Communication: No difficulties   Cognition Arousal/Alertness: Awake/alert Behavior During Therapy: WFL for tasks assessed/performed Overall Cognitive Status: Within Functional Limits for tasks assessed  General Comments       Exercises     Shoulder Instructions      Home Living Family/patient expects to be discharged to:: Private residence Living Arrangements: Children Available Help at Discharge:  Family;Available 24 hours/day Type of Home: House Home Access: Stairs to enter [employer] of Steps: 3 Entrance Stairs-Rails: None Home Layout: Two level;Able to live on main level with bedroom/bathroom     Bathroom Shower/Tub: Walk-in shower;Tub/shower unit   Bathroom Toilet: Handicapped height     Home Equipment: Cane - single point;Crutches          Prior Functioning/Environment Level of Independence: Independent with assistive device(s)        Comments: Used cane for ambulation         OT Problem List:        OT Treatment/Interventions:      OT Goals(Current goals can be found in the care plan section) Acute Rehab OT Goals Patient Stated Goal: home today OT Goal Formulation: All assessment and education complete, DC therapy  OT Frequency:     Barriers to D/C:            Co-evaluation              AM-PAC PT "6 Clicks" Daily Activity     Outcome Measure Help from another person eating meals?: None Help from another person taking care of personal grooming?: A Little Help from another person toileting, which includes using toliet, bedpan, or urinal?: A Little Help from another person bathing (including washing, rinsing, drying)?: A Little Help from another person to put on and taking off regular upper body clothing?: None Help from another person to put on and taking off regular lower body clothing?: A Little 6 Click Score: 20   End of Session Equipment Utilized During Treatment: Rolling walker CPM Left Knee CPM Left Knee: Off Left Knee Flexion (Degrees): 60 Left Knee Extension (Degrees): 0  Activity Tolerance: Patient tolerated treatment well Patient left: in bed;with call bell/phone within reach  OT Visit Diagnosis: Other abnormalities of gait and mobility (R26.89);Pain Pain - Right/Left: Left Pain - part of body: Knee                Time: 3704-8889 OT Time Calculation (min): 13 min Charges:  OT General Charges $OT Visit: 1  Visit OT Evaluation $OT Eval Low Complexity: 1 Low G-Codes:     Marshawn Ninneman A. Ulice Brilliant, M.S., OTR/L Pager: [place] 04/10/2017, 9:56 AM

## 2017-04-10 NOTE — Discharge Summary (Signed)
Discharge Summary  Patient ID: Wanda Green MRN: [id number] DOB/AGE: 1934/10/19 83 y.o.  Admit date: 04/09/2017 Discharge date: 04/10/2017  Admission Diagnoses:  Primary osteoarthritis of left knee  Discharge Diagnoses:  Principal Problem:   Primary osteoarthritis of left knee Active Problems:   HTN (hypertension)   OSA (obstructive sleep apnea)   Colitis, ischemic (HCC)   Primary osteoarthritis of knee   Past Medical History:  Diagnosis Date  . Anemia   . Arthritis    "knees" (04/09/2017)  . Breast cancer, right breast ([place]) 2000  . Cervical dysplasia age 58  . Claustrophobia    "SEVERE"  . Colitis, ischemic ([place])   . Complication of anesthesia   . Dysrhythmia    hx of a-fib (when her colitis flared up 2016)  back in rhythym  . Elevated cholesterol   . GERD (gastroesophageal reflux disease)    takes prilosec as needed  . Heart attack ([place]) 2016   "mild one";  Dr Terrence Dupont  . Heart murmur   . Hypertension   . OSA on CPAP    tested  2014  . Osteoporosis 06/2016   T score -3.0  . PONV (postoperative nausea and vomiting)     Surgeries: Procedure(s): LEFT TOTAL KNEE ARTHROPLASTY on 04/09/2017   Consultants (if any):   Discharged Condition: Improved  Hospital Course: Wanda Green is an 83 y.o. female who was admitted 04/09/2017 with a diagnosis of Primary osteoarthritis of left knee and went to the operating room on 04/09/2017 and underwent the above named procedures.    She was given perioperative antibiotics:  Anti-infectives (From admission, onward)   Start     Dose/Rate Route Frequency Ordered Stop   04/09/17 1400  clindamycin (CLEOCIN) IVPB 600 mg     600 mg 100 mL/hr over 30 Minutes Intravenous Every 6 hours 04/09/17 1131 04/09/17 2245   04/09/17 0716  clindamycin (CLEOCIN) 900 MG/50ML IVPB    Comments:  Tamsen Snider   : cabinet override      04/09/17 0716 04/09/17 0743    .  She was given sequential compression devices, early ambulation, and Eliquis  for DVT prophylaxis.  She benefited maximally from the hospital stay and there were no complications.    Recent vital signs:  Vitals:   04/10/17 0130 04/10/17 0448  BP: (!) 113/52 (!) 129/59  Pulse: 65 70  Resp: 16 19  Temp: 98.1 F (36.7 C) 98.2 F (36.8 C)  SpO2: 96% 94%    Recent laboratory studies:  Lab Results  Component Value Date   HGB 11.1 (L) 03/28/2017   HGB 11.7 (L) 11/18/2015   HGB 11.0 (L) 09/12/2015   Lab Results  Component Value Date   WBC 6.3 03/28/2017   PLT 227 03/28/2017   Lab Results  Component Value Date   INR 1.12 11/25/2014   Lab Results  Component Value Date   NA 139 03/28/2017   K 4.3 03/28/2017   CL 105 03/28/2017   CO2 24 03/28/2017   BUN 18 03/28/2017   CREATININE 1.13 (H) 03/28/2017   GLUCOSE 116 (H) 03/28/2017    Discharge Medications:   Allergies as of 04/10/2017      Reactions   Penicillins Nausea And Vomiting, Other (See Comments)   Caused bleeding also PATIENT HAS HAD A PCN REACTION WITH IMMEDIATE RASH, FACIAL/TONGUE/THROAT SWELLING, SOB, OR LIGHTHEADEDNESS WITH HYPOTENSION:  #  #  #  YES  #  #  #   Has patient had a PCN reaction  causing severe rash involving mucus membranes or skin necrosis: No Has patient had a PCN reaction that required hospitalization No Has patient had a PCN reaction occurring within the last 10 years: #  #  #  YES  #  #  # .   Atorvastatin Other (See Comments)   Muscle and leg myalgias   Augmentin [amoxicillin-pot Clavulanate] Nausea And Vomiting, Other (See Comments)   Mouth pain   Codeine Nausea And Vomiting   Doxycycline Nausea And Vomiting   Hydrocodone Nausea And Vomiting   Oxycodone-acetaminophen Nausea And Vomiting      Medication List    TAKE these medications   acetaminophen 500 MG tablet Commonly known as:  TYLENOL Take 2 tablets (1,000 mg total) by mouth every 8 (eight) hours for 14 days. For Pain. What changed:    when to take this  reasons to take this  additional  instructions   alendronate 70 MG tablet Commonly known as:  FOSAMAX Take 1 tablet (70 mg total) by mouth every 7 (seven) days. Take with a full glass of water on an empty stomach.   amLODipine 2.5 MG tablet Commonly known as:  NORVASC Take 2.5 mg by mouth at bedtime.   apixaban 2.5 MG Tabs tablet Commonly known as:  ELIQUIS Take 1 tablet (2.5 mg total) by mouth 2 (two) times daily for 5 days. For postoperative DVT prophylaxis.  Take 81 mg Aspirin daily after 5 days.   aspirin 81 MG EC tablet Take 1 tablet (81 mg total) by mouth daily. After you have completed 5 day Rx of Apixiban (Eliquis) What changed:  additional instructions   atorvastatin 20 MG tablet Commonly known as:  LIPITOR Take 10 mg by mouth every 3 (three) days.   baclofen 10 MG tablet Commonly known as:  LIORESAL Take 1 tablet (10 mg total) by mouth 3 (three) times daily as needed for muscle spasms.   cetirizine 10 MG tablet Commonly known as:  ZYRTEC Take 10 mg by mouth at bedtime.   docusate sodium 100 MG capsule Commonly known as:  COLACE Take 1 capsule (100 mg total) by mouth 2 (two) times daily. To prevent constipation while taking pain medication.   losartan 100 MG tablet Commonly known as:  COZAAR Take 100 mg by mouth daily.   metoprolol succinate 25 MG 24 hr tablet Commonly known as:  TOPROL-XL Take 25 mg by mouth daily.   metoprolol succinate 50 MG 24 hr tablet Commonly known as:  TOPROL-XL Take 50 mg by mouth at bedtime.   multivitamin tablet Take 1 tablet by mouth daily.   omeprazole 40 MG capsule Commonly known as:  PRILOSEC Take 1 capsule (40 mg total) by mouth daily as needed. What changed:  reasons to take this   ondansetron 4 MG tablet Commonly known as:  ZOFRAN Take 1 tablet (4 mg total) by mouth every 8 (eight) hours as needed for nausea or vomiting.   oxybutynin 5 MG tablet Commonly known as:  DITROPAN Take 1 tablet (5 mg total) by mouth 3 (three) times daily.   oxyCODONE 5  MG immediate release tablet Commonly known as:  ROXICODONE Take 1-2 tablets (5-10 mg total) by mouth every 6 (six) hours as needed for breakthrough pain.   PROBIOTIC-10 Chew Chew 1 tablet by mouth daily.       Diagnostic Studies: Dg Knee Left Port  Result Date: 04/09/2017 CLINICAL DATA:  Osteoarthritis. Status post left total knee replacement. EXAM: PORTABLE LEFT KNEE - 1-2 VIEW COMPARISON:  None. FINDINGS: The components of the total knee prosthesis appear in excellent position. No fractures. Postsurgical air to the expected degree. IMPRESSION: Satisfactory appearance of the left knee after total knee arthroplasty. Electronically Signed   By: Lorriane Shire M.D.   On: 04/09/2017 10:40    Disposition: 01-Home or Self Care  Discharge Instructions    Discharge patient   Complete by:  As directed    After therapy sessions.   Discharge disposition:  01-Home or Self Care   Discharge patient date:  04/10/2017      Follow-up Information    [name], MD Follow up.   Specialty:  Orthopedic Surgery Contact information: [address] [address] [phone]            Signed: Prudencio Burly III PA-C 04/10/2017, 7:19 AM

## 2017-04-10 NOTE — Progress Notes (Signed)
    Subjective: Patient reports pain as mild.  Tolerating diet.  Urinating.  +Flatus.  No CP, SOB.  OOB walking well w/ PT.  Objective:   VITALS:   Vitals:   04/09/17 1115 04/09/17 2137 04/10/17 0130 04/10/17 0448  BP:  (!) 118/58 (!) 113/52 (!) 129/59  Pulse:  69 65 70  Resp:  16 16 19   Temp: (!) 97.4 F (36.3 C) 97.7 F (36.5 C) 98.1 F (36.7 C) 98.2 F (36.8 C)  TempSrc:  Oral Oral Oral  SpO2:  97% 96% 94%  Weight:      Height:       CBC Latest Ref Rng & Units 03/28/2017 11/18/2015 09/12/2015  WBC 4.0 - 10.5 K/uL 6.3 7.2 5.7  Hemoglobin 12.0 - 15.0 g/dL 11.1(L) 11.7(L) 11.0(L)  Hematocrit 36.0 - 46.0 % 33.7(L) 36.9 33.1(L)  Platelets 150 - 400 K/uL 227 200 227   BMP Latest Ref Rng & Units 03/28/2017 11/18/2015 09/12/2015  Glucose 65 - 99 mg/dL 116(H) 112(H) 103(H)  BUN 6 - 20 mg/dL 18 27(H) 25  Creatinine 0.44 - 1.00 mg/dL 1.13(H) 1.46(H) 1.31(H)  BUN/Creat Ratio 12 - 28 - - 19  Sodium 135 - 145 mmol/L 139 140 140  Potassium 3.5 - 5.1 mmol/L 4.3 4.6 4.4  Chloride 101 - 111 mmol/L 105 107 100  CO2 22 - 32 mmol/L 24 25 21   Calcium 8.9 - 10.3 mg/dL 9.5 9.6 9.3   Intake/Output      02/05 0701 - 02/06 0700 02/06 0701 - 02/07 0700   I.V. (mL/kg) 2873.3 (39.6)    IV Piggyback 100    Total Intake(mL/kg) 2973.3 (41)    Urine (mL/kg/hr) 0 (0)    Blood 20    Total Output 20    Net +2953.3         Urine Occurrence 4 x       Physical Exam: General: NAD.  Supine in bed.  Calm, conversant.  No increased wob. Resp: No increased wob Cardio: regular rate and rhythm ABD soft Neurologically intact MSK LLE: In CPM on arrival. Neurovascularly intact Sensation intact distally Feet warm Dorsiflexion/Plantar flexion intact Incision: dressing C/D/I   Assessment: 1 Day Post-Op  S/P Procedure(s) (LRB): LEFT TOTAL KNEE ARTHROPLASTY (Left) by Dr. Ernesta Amble. Percell Miller on 04/09/17  Principal Problem:   Primary osteoarthritis of left knee Active Problems:   HTN (hypertension)  OSA (obstructive sleep apnea)   Colitis, ischemic (HCC)   Primary osteoarthritis of knee   S/P Left TKA Doing well POD1 Eating, drinking, voiding, and mobilizing well.  Plan: Up with therapy D/C IV fluids Incentive Spirometry Elevate and Apply ice CPM, bone foam  Weight Bearing: Weight Bearing as Tolerated (WBAT)  Dressings: Maintain Mepilex.  Please place thigh high ted hose operative leg before d/c.  VTE prophylaxis: Eliquis, SCDs, ambulation Dispo: Home later today after therapy sessions.   Charna Elizabeth Martensen III, PA-C 04/10/2017, 7:11 AM

## 2017-04-10 NOTE — Care Management Note (Signed)
Case Management Note  Patient Details  Name: Wanda Green MRN: [id number] Date of Birth: [birth year]  Subjective/Objective:    83 yr old female s/p left total knee arthroplasty.                Action/Plan: Patient was preoperatively setup with [facility], no changes. She will have family support at discharge.    Expected Discharge Date:  04/10/17               Expected Discharge Plan:  [place]  In-House Referral:  NA  Discharge planning Services  CM Consult  Post Acute Care Choice:  Durable Medical Equipment, Home Health Choice offered to:  Patient  DME Arranged:  3-N-1, CPM, Walker rolling DME Agency:  [employer]  HH Arranged:  PT [place]:  [facility] [employer]  Status of Service:  Completed, signed off  If discussed at [employer], dates discussed:    Additional Comments:  Ninfa Meeker, RN 04/10/2017, 3:08 PM

## 2017-04-10 NOTE — Plan of Care (Signed)
Progressing

## 2017-04-10 NOTE — Discharge Instructions (Signed)
You may bear weight as tolerated. °Keep your dressing on and dry until follow up. °Take medicine to prevent blood clots as directed. °Take pain medicine as needed with the goal of transitioning to over the counter medicines.  ° °INSTRUCTIONS AFTER JOINT REPLACEMENT  ° °o Remove items at home which could result in a fall. This includes throw rugs or furniture in walking pathways °o ICE to the affected joint every three hours while awake for 30 minutes at a time, for at least the first 3-5 days, and then as needed for pain and swelling.  Continue to use ice for pain and swelling. You may notice swelling that will progress down to the foot and ankle.  This is normal after surgery.  Elevate your leg when you are not up walking on it.   °o Continue to use the breathing machine you got in the hospital (incentive spirometer) which will help keep your temperature down.  It is common for your temperature to cycle up and down following surgery, especially at night when you are not up moving around and exerting yourself.  The breathing machine keeps your lungs expanded and your temperature down. ° ° °DIET:  As you were doing prior to hospitalization, we recommend a well-balanced diet. ° °DRESSING / WOUND CARE / SHOWERING ° °You may shower 3 days after surgery, but keep the wounds dry during showering.  You may use an occlusive plastic wrap (Press'n Seal for example) with blue painter's tape at edges, NO SOAKING/SUBMERGING IN THE BATHTUB.  If the bandage gets wet, change with a clean dry gauze.  If the incision gets wet, pat the wound dry with a clean towel. ° °ACTIVITY ° °o Increase activity slowly as tolerated, but follow the weight bearing instructions below.   °o No driving for 6 weeks or until further direction given by your physician.  You cannot drive while taking narcotics.  °o No lifting or carrying greater than 10 lbs. until further directed by your surgeon. °o Avoid periods of inactivity such as sitting longer than  an hour when not asleep. This helps prevent blood clots.  °o You may return to work once you are authorized by your doctor.  ° ° ° °WEIGHT BEARING  ° °Weight bearing as tolerated with assist device (walker, cane, etc) as directed, use it as long as suggested by your surgeon or therapist, typically at least 4-6 weeks. ° ° °EXERCISES ° °Results after joint replacement surgery are often greatly improved when you follow the exercise, range of motion and muscle strengthening exercises prescribed by your doctor. Safety measures are also important to protect the joint from further injury. Any time any of these exercises cause you to have increased pain or swelling, decrease what you are doing until you are comfortable again and then slowly increase them. If you have problems or questions, call your caregiver or physical therapist for advice.  ° °Rehabilitation is important following a joint replacement. After just a few days of immobilization, the muscles of the leg can become weakened and shrink (atrophy).  These exercises are designed to build up the tone and strength of the thigh and leg muscles and to improve motion. Often times heat used for twenty to thirty minutes before working out will loosen up your tissues and help with improving the range of motion but do not use heat for the first two weeks following surgery (sometimes heat can increase post-operative swelling).  ° °These exercises can be done on a training (exercise)   mat, on the floor, on a table or on a bed. Use whatever works the best and is most comfortable for you.    Use music or television while you are exercising so that the exercises are a pleasant break in your day. This will make your life better with the exercises acting as a break in your routine that you can look forward to.   Perform all exercises about fifteen times, three times per day or as directed.  You should exercise both the operative leg and the other leg as well. ° °Exercises  include: °  °• Quad Sets - Tighten up the muscle on the front of the thigh (Quad) and hold for 5-10 seconds.   °• Straight Leg Raises - With your knee straight (if you were given a brace, keep it on), lift the leg to 60 degrees, hold for 3 seconds, and slowly lower the leg.  Perform this exercise against resistance later as your leg gets stronger.  °• Leg Slides: Lying on your back, slowly slide your foot toward your buttocks, bending your knee up off the floor (only go as far as is comfortable). Then slowly slide your foot back down until your leg is flat on the floor again.  °• Angel Wings: Lying on your back spread your legs to the side as far apart as you can without causing discomfort.  °• Hamstring Strength:  Lying on your back, push your heel against the floor with your leg straight by tightening up the muscles of your buttocks.  Repeat, but this time bend your knee to a comfortable angle, and push your heel against the floor.  You may put a pillow under the heel to make it more comfortable if necessary.  ° °A rehabilitation program following joint replacement surgery can speed recovery and prevent re-injury in the future due to weakened muscles. Contact your doctor or a physical therapist for more information on knee rehabilitation.  ° ° °CONSTIPATION ° °Constipation is defined medically as fewer than three stools per week and severe constipation as less than one stool per week.  Even if you have a regular bowel pattern at home, your normal regimen is likely to be disrupted due to multiple reasons following surgery.  Combination of anesthesia, postoperative narcotics, change in appetite and fluid intake all can affect your bowels.  ° °YOU MUST use at least one of the following options; they are listed in order of increasing strength to get the job done.  They are all available over the counter, and you may need to use some, POSSIBLY even all of these options:   ° °Drink plenty of fluids (prune juice may be  helpful) and high fiber foods °Colace 100 mg by mouth twice a day  °Senokot for constipation as directed and as needed Dulcolax (bisacodyl), take with full glass of water  °Miralax (polyethylene glycol) once or twice a day as needed. ° °If you have tried all these things and are unable to have a bowel movement in the first 3-4 days after surgery call either your surgeon or your primary doctor.   ° °If you experience loose stools or diarrhea, hold the medications until you stool forms back up.  If your symptoms do not get better within 1 week or if they get worse, check with your doctor.  If you experience "the worst abdominal pain ever" or develop nausea or vomiting, please contact the office immediately for further recommendations for treatment. ° ° °ITCHING:  If you   experience itching with your medications, try taking only a single pain pill, or even half a pain pill at a time.  You can also use Benadryl over the counter for itching or also to help with sleep.   TED HOSE STOCKINGS:  Use stockings on both legs until for at least 2 weeks or as directed by physician office. They may be removed at night for sleeping.  MEDICATIONS:  See your medication summary on the After Visit Summary that nursing will review with you.  You may have some home medications which will be placed on hold until you complete the course of blood thinner medication.  It is important for you to complete the blood thinner medication as prescribed.  PRECAUTIONS:  If you experience chest pain or shortness of breath - call 911 immediately for transfer to the hospital emergency department.   If you develop a fever greater that 101 F, purulent drainage from wound, increased redness or drainage from wound, foul odor from the wound/dressing, or calf pain - CONTACT YOUR SURGEON.                                                   FOLLOW-UP APPOINTMENTS:  If you do not already have a post-op appointment, please call the office for an  appointment to be seen by your surgeon.  Guidelines for how soon to be seen are listed in your After Visit Summary, but are typically between 1-4 weeks after surgery.  OTHER INSTRUCTIONS:     MAKE SURE YOU:   Understand these instructions.   Get help right away if you are not doing well or get worse.    Thank you for letting us be a part of your medical care team.  It is a privilege we respect greatly.  We hope these instructions will help you stay on track for a fast and full recovery!       Information on my medicine - ELIQUIS (apixaban)  Why was Eliquis prescribed for you? Eliquis was prescribed for you to reduce the risk of blood clots forming after orthopedic surgery.    What do You need to know about Eliquis? Take your Eliquis TWICE DAILY - one tablet in the morning and one tablet in the evening with or without food.  It would be best to take the dose about the same time each day.  If you have difficulty swallowing the tablet whole please discuss with your pharmacist how to take the medication safely.  Take Eliquis exactly as prescribed by your doctor and DO NOT stop taking Eliquis without talking to the doctor who prescribed the medication.  Stopping without other medication to take the place of Eliquis may increase your risk of developing a clot.  After discharge, you should have regular check-up appointments with your healthcare provider that is prescribing your Eliquis.  What do you do if you miss a dose? If a dose of ELIQUIS is not taken at the scheduled time, take it as soon as possible on the same day and twice-daily administration should be resumed.  The dose should not be doubled to make up for a missed dose.  Do not take more than one tablet of ELIQUIS at the same time.  Important Safety Information A possible side effect of Eliquis is bleeding. You should call your healthcare provider right away if  you experience any of the following: ? Bleeding  from an injury or your nose that does not stop. ? Unusual colored urine (red or dark brown) or unusual colored stools (red or black). ? Unusual bruising for unknown reasons. ? A serious fall or if you hit your head (even if there is no bleeding).  Some medicines may interact with Eliquis and might increase your risk of bleeding or clotting while on Eliquis. To help avoid this, consult your healthcare provider or pharmacist prior to using any new prescription or non-prescription medications, including herbals, vitamins, non-steroidal anti-inflammatory drugs (NSAIDs) and supplements.  This website has more information on Eliquis (apixaban): http://www.eliquis.com/eliquis/home

## 2017-04-10 NOTE — Progress Notes (Signed)
Physical Therapy Treatment Patient Details Name: Wanda Green MRN: [id number] DOB: [birth year] Today's Date: 04/10/2017    History of Present Illness Pt is an 83 y/o female s/p elective L TKA. PMH includes HTN, breast cancer, MI, and osteoporosis.     PT Comments    This session focused on HEP and pt demonstrated improved ROM and with little c/o pain. Pt was able to complete exercises with min cues. Current plan remains appropriate.    Follow Up Recommendations  DC plan and follow up therapy as arranged by surgeon;Supervision for mobility/OOB     Equipment Recommendations  Rolling walker with 5" wheels;3in1 (PT)    Recommendations for Other Services       Precautions / Restrictions Precautions Precautions: Knee Precaution Booklet Issued: Yes (comment) Precaution Comments: reviewed precautions with pt Restrictions Weight Bearing Restrictions: Yes LLE Weight Bearing: Weight bearing as tolerated    Mobility  Bed Mobility Overal bed mobility: Modified Independent                Transfers Overall transfer level: Needs assistance Equipment used: Rolling walker (2 wheeled) Transfers: Sit to/from Stand Sit to Stand: Supervision         General transfer comment: for safety  Ambulation/Gait Ambulation/Gait assistance: Supervision Ambulation Distance (Feet): 250 Feet Assistive device: Rolling walker (2 wheeled) Gait Pattern/deviations: Decreased weight shift to left;Antalgic;Step-through pattern;Decreased stride length Gait velocity: Decreased    General Gait Details: mildly antalgic; cues for cadence and proximity to RW initially; steady gait with good step length symmetry   Stairs Stairs: Yes   Stair Management: No rails;Step to pattern;Backwards;With walker Number of Stairs: 2 General stair comments: cues for sequencing and technique; assist to stabilize RW  Wheelchair Mobility    Modified Rankin (Stroke Patients Only)       Balance Overall  balance assessment: Needs assistance Sitting-balance support: Feet supported;No upper extremity supported Sitting balance-Leahy Scale: Good     Standing balance support: Bilateral upper extremity supported Standing balance-Leahy Scale: Poor Standing balance comment: RW for support                            Cognition Arousal/Alertness: Awake/alert Behavior During Therapy: WFL for tasks assessed/performed Overall Cognitive Status: Within Functional Limits for tasks assessed                                        Exercises Total Joint Exercises Ankle Circles/Pumps: AROM;Both;10 reps Quad Sets: AROM;Both;10 reps Short Arc Quad: AROM;Left;10 reps Heel Slides: AROM;Left;10 reps Hip ABduction/ADduction: AROM;Left;10 reps Straight Leg Raises: AROM;Left;10 reps Long Arc Quad: AROM;Left;10 reps;Seated Knee Flexion: AROM;Left;5 reps;Seated;Other (comment)(10 sec holds) Goniometric ROM: approx 5-90    General Comments        Pertinent Vitals/Pain Pain Assessment: Faces Faces Pain Scale: Hurts a little bit Pain Location: L knee Pain Descriptors / Indicators: Sore Pain Intervention(s): Monitored during session;Repositioned    Home Living                      Prior Function            PT Goals (current goals can now be found in the care plan section) Acute Rehab PT Goals Patient Stated Goal: home today PT Goal Formulation: With patient Time For Goal Achievement: 04/23/17 Potential to Achieve Goals: Good Progress towards PT goals: Progressing  toward goals    Frequency    7X/week      PT Plan Current plan remains appropriate    Co-evaluation              AM-PAC PT "6 Clicks" Daily Activity  Outcome Measure  Difficulty turning over in bed (including adjusting bedclothes, sheets and blankets)?: A Little Difficulty moving from lying on back to sitting on the side of the bed? : A Little Difficulty sitting down on and  standing up from a chair with arms (e.g., wheelchair, bedside commode, etc,.)?: A Little Help needed moving to and from a bed to chair (including a wheelchair)?: A Little Help needed walking in hospital room?: A Little Help needed climbing 3-5 steps with a railing? : A Little 6 Click Score: 18    End of Session Equipment Utilized During Treatment: Gait belt Activity Tolerance: Patient tolerated treatment well Patient left: in chair;with call bell/phone within reach Nurse Communication: Mobility status PT Visit Diagnosis: Other abnormalities of gait and mobility (R26.89);Pain Pain - Right/Left: Left Pain - part of body: Knee     Time: 1335-1400 PT Time Calculation (min) (ACUTE ONLY): 25 min  Charges:   $Therapeutic Exercise: 23-37 mins                    G Codes:       Earney Navy, PTA Pager: [phone]     Darliss Cheney 04/10/2017, 2:07 PM

## 2017-04-10 NOTE — Plan of Care (Signed)
  Activity: Risk for activity intolerance will decrease 04/10/2017 1050 - Progressing by Williams Che, RN   Nutrition: Adequate nutrition will be maintained 04/10/2017 1050 - Progressing by Williams Che, RN   Elimination: Will not experience complications related to bowel motility 04/10/2017 1050 - Progressing by Williams Che, RN   Pain Managment: General experience of comfort will improve 04/10/2017 1050 - Progressing by Williams Che, RN   Safety: Ability to remain free from injury will improve 04/10/2017 1050 - Progressing by Williams Che, RN

## 2017-04-10 NOTE — Progress Notes (Signed)
Physical Therapy Treatment Patient Details Name: Wanda Green MRN: [id number] DOB: [birth year] Today's Date: 04/10/2017    History of Present Illness Pt is an 83 y/o female s/p elective L TKA. PMH includes HTN, breast cancer, MI, and osteoporosis.     PT Comments    Patient is making good progress with PT.  From a mobility standpoint anticipate patient will be ready for DC home when medically ready.    Follow Up Recommendations  DC plan and follow up therapy as arranged by surgeon;Supervision for mobility/OOB     Equipment Recommendations  Rolling walker with 5" wheels;3in1 (PT)    Recommendations for Other Services       Precautions / Restrictions Precautions Precautions: Knee Precaution Booklet Issued: Yes (comment) Precaution Comments: reviewed precautions with pt Restrictions Weight Bearing Restrictions: Yes LLE Weight Bearing: Weight bearing as tolerated    Mobility  Bed Mobility Overal bed mobility: Modified Independent                Transfers Overall transfer level: Needs assistance Equipment used: Rolling walker (2 wheeled) Transfers: Sit to/from Stand Sit to Stand: Supervision         General transfer comment: for safety  Ambulation/Gait Ambulation/Gait assistance: Supervision Ambulation Distance (Feet): 250 Feet Assistive device: Rolling walker (2 wheeled) Gait Pattern/deviations: Decreased weight shift to left;Antalgic;Step-through pattern;Decreased stride length Gait velocity: Decreased    General Gait Details: mildly antalgic; cues for cadence and proximity to RW initially; steady gait with good step length symmetry   Stairs Stairs: Yes   Stair Management: No rails;Step to pattern;Backwards;With walker Number of Stairs: 2 General stair comments: cues for sequencing and technique; assist to stabilize RW  Wheelchair Mobility    Modified Rankin (Stroke Patients Only)       Balance Overall balance assessment: Needs  assistance Sitting-balance support: Feet supported;No upper extremity supported Sitting balance-Leahy Scale: Good     Standing balance support: Bilateral upper extremity supported Standing balance-Leahy Scale: Poor Standing balance comment: RW for support                            Cognition Arousal/Alertness: Awake/alert Behavior During Therapy: WFL for tasks assessed/performed Overall Cognitive Status: Within Functional Limits for tasks assessed                                        Exercises      General Comments        Pertinent Vitals/Pain Pain Assessment: Faces Faces Pain Scale: Hurts a little bit Pain Location: L knee Pain Descriptors / Indicators: Discomfort Pain Intervention(s): Monitored during session;Repositioned;Ice applied    Home Living Family/patient expects to be discharged to:: Private residence Living Arrangements: Children Available Help at Discharge: Family;Available 24 hours/day Type of Home: House Home Access: Stairs to enter Entrance Stairs-Rails: None Home Layout: Two level;Able to live on main level with bedroom/bathroom Home Equipment: Cane - single point;Crutches      Prior Function Level of Independence: Independent with assistive device(s)      Comments: Used cane for ambulation    PT Goals (current goals can now be found in the care plan section) Acute Rehab PT Goals Patient Stated Goal: home today PT Goal Formulation: With patient Time For Goal Achievement: 04/23/17 Potential to Achieve Goals: Good Progress towards PT goals: Progressing toward goals    Frequency  7X/week      PT Plan Current plan remains appropriate    Co-evaluation              AM-PAC PT "6 Clicks" Daily Activity  Outcome Measure  Difficulty turning over in bed (including adjusting bedclothes, sheets and blankets)?: A Little Difficulty moving from lying on back to sitting on the side of the bed? : A  Little Difficulty sitting down on and standing up from a chair with arms (e.g., wheelchair, bedside commode, etc,.)?: A Little Help needed moving to and from a bed to chair (including a wheelchair)?: A Little Help needed walking in hospital room?: A Little Help needed climbing 3-5 steps with a railing? : A Little 6 Click Score: 18    End of Session Equipment Utilized During Treatment: Gait belt Activity Tolerance: Patient tolerated treatment well Patient left: in chair;with call bell/phone within reach Nurse Communication: Mobility status PT Visit Diagnosis: Other abnormalities of gait and mobility (R26.89);Pain Pain - Right/Left: Left Pain - part of body: Knee     Time: 5537-4827 PT Time Calculation (min) (ACUTE ONLY): 17 min  Charges:  $Gait Training: 8-22 mins                    G Codes:       Earney Navy, PTA Pager: [phone]     Darliss Cheney 04/10/2017, 11:46 AM

## 2017-04-10 NOTE — Addendum Note (Signed)
Addendum  created 04/10/17 1648 by Belinda Block, MD   Intraprocedure Blocks edited, Sign clinical note

## 2017-04-10 NOTE — Progress Notes (Signed)
Removed IV, provided discharge education/instructions, all questions and concerns addressed, Pt not in distress, discharged home with belongings accompanied by daughter. 

## 2017-04-11 DIAGNOSIS — D649 Anemia, unspecified: Secondary | ICD-10-CM | POA: Diagnosis not present

## 2017-04-11 DIAGNOSIS — G4733 Obstructive sleep apnea (adult) (pediatric): Secondary | ICD-10-CM | POA: Diagnosis not present

## 2017-04-11 DIAGNOSIS — K559 Vascular disorder of intestine, unspecified: Secondary | ICD-10-CM | POA: Diagnosis not present

## 2017-04-11 DIAGNOSIS — Z853 Personal history of malignant neoplasm of breast: Secondary | ICD-10-CM | POA: Diagnosis not present

## 2017-04-11 DIAGNOSIS — Z951 Presence of aortocoronary bypass graft: Secondary | ICD-10-CM | POA: Diagnosis not present

## 2017-04-11 DIAGNOSIS — I4891 Unspecified atrial fibrillation: Secondary | ICD-10-CM | POA: Diagnosis not present

## 2017-04-11 DIAGNOSIS — I252 Old myocardial infarction: Secondary | ICD-10-CM | POA: Diagnosis not present

## 2017-04-11 DIAGNOSIS — Z471 Aftercare following joint replacement surgery: Secondary | ICD-10-CM | POA: Diagnosis not present

## 2017-04-11 DIAGNOSIS — M1712 Unilateral primary osteoarthritis, left knee: Secondary | ICD-10-CM | POA: Diagnosis not present

## 2017-04-11 DIAGNOSIS — I10 Essential (primary) hypertension: Secondary | ICD-10-CM | POA: Diagnosis not present

## 2017-04-11 DIAGNOSIS — M810 Age-related osteoporosis without current pathological fracture: Secondary | ICD-10-CM | POA: Diagnosis not present

## 2017-04-11 DIAGNOSIS — Z7901 Long term (current) use of anticoagulants: Secondary | ICD-10-CM | POA: Diagnosis not present

## 2017-04-11 DIAGNOSIS — Z96652 Presence of left artificial knee joint: Secondary | ICD-10-CM | POA: Diagnosis not present

## 2017-04-12 NOTE — Addendum Note (Signed)
Addendum  created 04/12/17 1837 by Belinda Block, MD   Intraprocedure Blocks edited, Sign clinical note

## 2017-04-15 DIAGNOSIS — G4733 Obstructive sleep apnea (adult) (pediatric): Secondary | ICD-10-CM | POA: Diagnosis not present

## 2017-04-16 DIAGNOSIS — G4733 Obstructive sleep apnea (adult) (pediatric): Secondary | ICD-10-CM | POA: Diagnosis not present

## 2017-04-16 DIAGNOSIS — I252 Old myocardial infarction: Secondary | ICD-10-CM | POA: Diagnosis not present

## 2017-04-16 DIAGNOSIS — Z951 Presence of aortocoronary bypass graft: Secondary | ICD-10-CM | POA: Diagnosis not present

## 2017-04-16 DIAGNOSIS — I4891 Unspecified atrial fibrillation: Secondary | ICD-10-CM | POA: Diagnosis not present

## 2017-04-16 DIAGNOSIS — K559 Vascular disorder of intestine, unspecified: Secondary | ICD-10-CM | POA: Diagnosis not present

## 2017-04-16 DIAGNOSIS — D649 Anemia, unspecified: Secondary | ICD-10-CM | POA: Diagnosis not present

## 2017-04-16 DIAGNOSIS — Z853 Personal history of malignant neoplasm of breast: Secondary | ICD-10-CM | POA: Diagnosis not present

## 2017-04-16 DIAGNOSIS — Z7901 Long term (current) use of anticoagulants: Secondary | ICD-10-CM | POA: Diagnosis not present

## 2017-04-16 DIAGNOSIS — Z471 Aftercare following joint replacement surgery: Secondary | ICD-10-CM | POA: Diagnosis not present

## 2017-04-16 DIAGNOSIS — M810 Age-related osteoporosis without current pathological fracture: Secondary | ICD-10-CM | POA: Diagnosis not present

## 2017-04-16 DIAGNOSIS — I10 Essential (primary) hypertension: Secondary | ICD-10-CM | POA: Diagnosis not present

## 2017-04-16 DIAGNOSIS — Z96652 Presence of left artificial knee joint: Secondary | ICD-10-CM | POA: Diagnosis not present

## 2017-04-19 ENCOUNTER — Other Ambulatory Visit: Payer: Self-pay

## 2017-04-19 ENCOUNTER — Emergency Department (HOSPITAL_COMMUNITY): Payer: PPO

## 2017-04-19 ENCOUNTER — Emergency Department (HOSPITAL_COMMUNITY)
Admission: EM | Admit: 2017-04-19 | Discharge: 2017-04-20 | Disposition: A | Discharge status: Home / Self Care | Payer: PPO | Attending: Emergency Medicine | Admitting: Emergency Medicine

## 2017-04-19 DIAGNOSIS — K567 Ileus, unspecified: Secondary | ICD-10-CM

## 2017-04-19 DIAGNOSIS — Z87891 Personal history of nicotine dependence: Secondary | ICD-10-CM | POA: Insufficient documentation

## 2017-04-19 DIAGNOSIS — R1110 Vomiting, unspecified: Secondary | ICD-10-CM | POA: Diagnosis not present

## 2017-04-19 DIAGNOSIS — I10 Essential (primary) hypertension: Secondary | ICD-10-CM | POA: Insufficient documentation

## 2017-04-19 DIAGNOSIS — T50905A Adverse effect of unspecified drugs, medicaments and biological substances, initial encounter: Secondary | ICD-10-CM | POA: Insufficient documentation

## 2017-04-19 DIAGNOSIS — I252 Old myocardial infarction: Secondary | ICD-10-CM | POA: Insufficient documentation

## 2017-04-19 DIAGNOSIS — Z7982 Long term (current) use of aspirin: Secondary | ICD-10-CM | POA: Diagnosis not present

## 2017-04-19 DIAGNOSIS — R112 Nausea with vomiting, unspecified: Secondary | ICD-10-CM | POA: Diagnosis not present

## 2017-04-19 DIAGNOSIS — Z96652 Presence of left artificial knee joint: Secondary | ICD-10-CM | POA: Diagnosis not present

## 2017-04-19 DIAGNOSIS — R109 Unspecified abdominal pain: Secondary | ICD-10-CM | POA: Diagnosis not present

## 2017-04-19 DIAGNOSIS — K5903 Drug induced constipation: Secondary | ICD-10-CM

## 2017-04-19 LAB — COMPREHENSIVE METABOLIC PANEL
ALK PHOS: 51 U/L (ref 38–126)
ALT: 15 U/L (ref 14–54)
ANION GAP: 12 (ref 5–15)
AST: 21 U/L (ref 15–41)
Albumin: 4.4 g/dL (ref 3.5–5.0)
BILIRUBIN TOTAL: 1 mg/dL (ref 0.3–1.2)
BUN: 22 mg/dL — ABNORMAL HIGH (ref 6–20)
CALCIUM: 9.6 mg/dL (ref 8.9–10.3)
CO2: 23 mmol/L (ref 22–32)
Chloride: 106 mmol/L (ref 101–111)
Creatinine, Ser: 1.31 mg/dL — ABNORMAL HIGH (ref 0.44–1.00)
GFR calc non Af Amer: 37 mL/min — ABNORMAL LOW (ref >60)
GFR, EST AFRICAN AMERICAN: 43 mL/min — AB (ref >60)
Glucose, Bld: 130 mg/dL — ABNORMAL HIGH (ref 65–99)
POTASSIUM: 3.8 mmol/L (ref 3.5–5.1)
Sodium: 141 mmol/L (ref 135–145)
TOTAL PROTEIN: 7.8 g/dL (ref 6.5–8.1)

## 2017-04-19 LAB — URINALYSIS, ROUTINE W REFLEX MICROSCOPIC
BILIRUBIN URINE: NEGATIVE
Bacteria, UA: NONE SEEN
Glucose, UA: NEGATIVE mg/dL
Hgb urine dipstick: NEGATIVE
Ketones, ur: 5 mg/dL — AB
NITRITE: NEGATIVE
PROTEIN: NEGATIVE mg/dL
SPECIFIC GRAVITY, URINE: 1.017 (ref 1.005–1.030)
pH: 5 (ref 5.0–8.0)

## 2017-04-19 LAB — CBC WITH DIFFERENTIAL/PLATELET
Basophils Absolute: 0 10*3/uL (ref 0.0–0.1)
Basophils Relative: 0 %
EOS ABS: 0.1 10*3/uL (ref 0.0–0.7)
Eosinophils Relative: 1 %
HEMATOCRIT: 31.2 % — AB (ref 36.0–46.0)
HEMOGLOBIN: 10.2 g/dL — AB (ref 12.0–15.0)
LYMPHS ABS: 0.9 10*3/uL (ref 0.7–4.0)
Lymphocytes Relative: 7 %
MCH: 30.9 pg (ref 26.0–34.0)
MCHC: 32.7 g/dL (ref 30.0–36.0)
MCV: 94.5 fL (ref 78.0–100.0)
MONO ABS: 1 10*3/uL (ref 0.1–1.0)
MONOS PCT: 8 %
NEUTROS ABS: 10.9 10*3/uL — AB (ref 1.7–7.7)
NEUTROS PCT: 84 %
Platelets: 147 10*3/uL — ABNORMAL LOW (ref 150–400)
RBC: 3.3 MIL/uL — ABNORMAL LOW (ref 3.87–5.11)
RDW: 14.1 % (ref 11.5–15.5)
WBC: 13 10*3/uL — ABNORMAL HIGH (ref 4.0–10.5)

## 2017-04-19 LAB — LIPASE, BLOOD: Lipase: 22 U/L (ref 11–51)

## 2017-04-19 MED ORDER — ONDANSETRON HCL 4 MG/2ML IJ SOLN
4.0000 mg | Freq: Once | INTRAMUSCULAR | Status: AC
  Administered 2017-04-19: 4 mg via INTRAVENOUS
  Filled 2017-04-19: qty 2

## 2017-04-19 MED ORDER — SODIUM CHLORIDE 0.9 % IV BOLUS (SEPSIS)
500.0000 mL | Freq: Once | INTRAVENOUS | Status: AC
  Administered 2017-04-19: 500 mL via INTRAVENOUS

## 2017-04-19 NOTE — ED Provider Notes (Signed)
[facility] DEPT Provider Note   CSN: [id number] Arrival date & time: 04/19/17  2153     History   Chief Complaint Chief Complaint  Patient presents with  . Nausea  . Emesis    HPI Wanda Green is a 83 y.o. female.  The history is provided by the patient and medical records.  Emesis   Associated symptoms include abdominal pain.    83 year old female with history of anemia, arthritis, history of right breast cancer, colitis, ischemic bowel, GERD, prior MI, hypertension, sleep apnea, postoperative nausea and vomiting, presenting to the ED with nausea and vomiting.  Patient underwent total left knee replacement on 04/09/2017 with Dr. Percell Miller.  States procedure went well without any noted complications.  States since being home she has not had much of an appetite and has been taking her pain medication on an empty stomach.  States she has had bowel movements which were normal.  Reports all day today she has had some abdominal pain which she describes as diffuse and aching in nature.  Reports ongoing nausea and vomiting.  Last dose of pain medication around 3:45 PM but she vomited that up as well.  She has been able to drink small amounts of fluids.  He did get a lot of relief with a dose of IV Zofran and fentanyl given in route to the ED.  Prior abdominal surgeries include tubal ligation.  Past Medical History:  Diagnosis Date  . Anemia   . Arthritis    "knees" (04/09/2017)  . Breast cancer, right breast ([place]) 2000  . Cervical dysplasia age 41  . Claustrophobia    "SEVERE"  . Colitis, ischemic ([place])   . Complication of anesthesia   . Dysrhythmia    hx of a-fib (when her colitis flared up 2016)  back in rhythym  . Elevated cholesterol   . GERD (gastroesophageal reflux disease)    takes prilosec as needed  . Heart attack ([place]) 2016   "mild one";  Dr Terrence Dupont  . Heart murmur   . Hypertension   . OSA on CPAP    tested  2014  . Osteoporosis 06/2016   T score -3.0  . PONV (postoperative nausea and vomiting)     Patient Active Problem List   Diagnosis Date Noted  . Primary osteoarthritis of knee 04/09/2017  . Colitis, ischemic ([place]) 03/18/2017  . Primary osteoarthritis of left knee 01/28/2017  . Acute coronary syndrome ([place]) 11/24/2014  . Colitis 08/31/2014  . Acute colitis 08/31/2014  . OSA (obstructive sleep apnea) 08/12/2012  . Atrial fibrillation ([place]) 06/24/2012  . HTN (hypertension) 06/24/2012  . Other and unspecified hyperlipidemia 06/24/2012  . Allergic rhinitis due to pollen 06/24/2012  . Breast cancer, stage 2 ([place]) 06/05/2011    Past Surgical History:  Procedure Laterality Date  . BREAST BIOPSY Right 2000  . BREAST LUMPECTOMY Right 2000  . CARDIAC CATHETERIZATION N/A 11/25/2014   Procedure: Left Heart Cath and Coronary Angiography;  Surgeon: Dixie Dials, MD;  Location: [facility];  Service: Cardiovascular;  Laterality: N/A;  . CATARACT EXTRACTION W/ INTRAOCULAR LENS  IMPLANT, BILATERAL Bilateral 2018  . CATARACT EXTRACTION, BILATERAL    . CERVICAL CONE BIOPSY  age 32  . COLONOSCOPY WITH PROPOFOL N/A 09/01/2014   Procedure: COLONOSCOPY WITH PROPOFOL;  Surgeon: Ronald Lobo, MD;  Location: [facility];  Service: Endoscopy;  Laterality: N/A;  this will be done unprepped  . COLPOSCOPY    . FRACTURE SURGERY    .  JOINT REPLACEMENT    . KNEE ARTHROSCOPY Bilateral   . NASAL SEPTUM SURGERY  12/14  . TOTAL KNEE ARTHROPLASTY Left 04/09/2017  . TOTAL KNEE ARTHROPLASTY Left 04/09/2017   Procedure: LEFT TOTAL KNEE ARTHROPLASTY;  Surgeon: [name], MD;  Location: [facility];  Service: Orthopedics;  Laterality: Left;  . TUBAL LIGATION    . WRIST FRACTURE SURGERY Left ~ 2006    OB History    Gravida Para Term Preterm AB Living   3 3 3     3    SAB TAB Ectopic Multiple Live Births                   Home Medications    Prior to Admission medications   Medication Sig Start Date End Date Taking? Authorizing  Provider  acetaminophen (TYLENOL) 500 MG tablet Take 2 tablets (1,000 mg total) by mouth every 8 (eight) hours for 14 days. For Pain. 04/09/17 04/23/17 Yes Martensen, Charna Elizabeth III, PA-C  amLODipine (NORVASC) 2.5 MG tablet Take 2.5 mg by mouth at bedtime. 11/15/14  Yes [provider]  aspirin 81 MG EC tablet Take 1 tablet (81 mg total) by mouth daily. After you have completed 5 day Rx of Apixiban (Eliquis) 04/09/17  Yes Martensen, Charna Elizabeth III, PA-C  atorvastatin (LIPITOR) 20 MG tablet Take 10 mg by mouth every 3 (three) days.    Yes [provider]  docusate sodium (COLACE) 100 MG capsule Take 1 capsule (100 mg total) by mouth 2 (two) times daily. To prevent constipation while taking pain medication. 04/09/17  Yes Prudencio Burly III, PA-C  losartan (COZAAR) 100 MG tablet Take 100 mg by mouth daily.   Yes [provider]  metoprolol succinate (TOPROL-XL) 25 MG 24 hr tablet Take 25 mg by mouth daily.    Yes [provider]  metoprolol succinate (TOPROL-XL) 50 MG 24 hr tablet Take 50 mg by mouth at bedtime.  12/23/16  Yes [provider]  omeprazole (PRILOSEC) 40 MG capsule Take 1 capsule (40 mg total) by mouth daily as needed. Patient taking differently: Take 40 mg by mouth daily as needed (for heartburn).  07/14/12  Yes Leandrew Koyanagi, MD  ondansetron (ZOFRAN) 4 MG tablet Take 1 tablet (4 mg total) by mouth every 8 (eight) hours as needed for nausea or vomiting. 04/09/17  Yes Prudencio Burly III, PA-C  oxybutynin (DITROPAN) 5 MG tablet Take 1 tablet (5 mg total) by mouth 3 (three) times daily. 04/20/16  Yes Fontaine, Belinda Block, MD  oxyCODONE (ROXICODONE) 5 MG immediate release tablet Take 1-2 tablets (5-10 mg total) by mouth every 6 (six) hours as needed for breakthrough pain. 04/09/17 05/09/17 Yes Martensen, Charna Elizabeth III, PA-C  alendronate (FOSAMAX) 70 MG tablet Take 1 tablet (70 mg total) by mouth every 7 (seven) days. Take with a full  glass of water on an empty stomach. Patient not taking: Reported on 04/19/2017 08/10/16   Fontaine, Belinda Block, MD  apixaban (ELIQUIS) 2.5 MG TABS tablet Take 1 tablet (2.5 mg total) by mouth 2 (two) times daily for 5 days. For postoperative DVT prophylaxis.  Take 81 mg Aspirin daily after 5 days. 04/09/17 04/14/17  Prudencio Burly III, PA-C  baclofen (LIORESAL) 10 MG tablet Take 1 tablet (10 mg total) by mouth 3 (three) times daily as needed for muscle spasms. Patient not taking: Reported on 04/19/2017 04/09/17   Prudencio Burly III, PA-C  HYDROcodone-acetaminophen (NORCO/VICODIN) 5-325 MG tablet Take 1  tablet by mouth every 6 (six) hours as needed for moderate pain or severe pain.  04/19/17   [provider]    Family History Family History  Problem Relation Age of Onset  . Hypertension Mother   . Heart failure Mother   . Heart disease Mother   . Hypertension Father   . Heart disease Father   . Hypertension Sister   . Uterine cancer Sister   . Hypertension Brother   . Heart disease Brother     Social History Social History   Tobacco Use  . Smoking status: Former Smoker    Packs/day: 0.10    Years: 3.00    Pack years: 0.30    Types: Cigarettes    Last attempt to quit: 03/05/1972    Years since quitting: 45.1  . Smokeless tobacco: Never Used  . Tobacco comment: ONE CIG A DAY  Substance Use Topics  . Alcohol use: No    Alcohol/week: 0.0 oz  . Drug use: No     Allergies   Penicillins; Atorvastatin; Augmentin [amoxicillin-pot clavulanate]; Codeine; Doxycycline; Hydrocodone; and Oxycodone-acetaminophen   Review of Systems Review of Systems  Gastrointestinal: Positive for abdominal pain, nausea and vomiting.  All other systems reviewed and are negative.    Physical Exam Updated Vital Signs BP (!) 127/99 (BP Location: Right Arm)   Pulse 78   Temp 98.4 F (36.9 C) (Oral)   Resp 18   Ht 5' 3.5" (1.613 m)   Wt 72.6 kg (160 lb)   LMP 07/22/2015   SpO2  93%   BMI 27.90 kg/m   Physical Exam  Constitutional: She is oriented to person, place, and time. She appears well-developed and well-nourished.  HENT:  Head: Normocephalic and atraumatic.  Mouth/Throat: Oropharynx is clear and moist.  Eyes: Conjunctivae and EOM are normal. Pupils are equal, round, and reactive to light.  Neck: Normal range of motion.  Cardiovascular: Normal rate, regular rhythm and normal heart sounds.  Pulmonary/Chest: Effort normal and breath sounds normal. No stridor. No respiratory distress.  Abdominal: Soft. Bowel sounds are normal. There is no tenderness. There is no rebound.  No significant distention, normal bowel sounds, no focal tenderness, no peritonitis  Musculoskeletal: Normal range of motion.  Neurological: She is alert and oriented to person, place, and time.  Skin: Skin is warm and dry.  Psychiatric: She has a normal mood and affect.  Nursing note and vitals reviewed.    ED Treatments / Results  Labs (all labs ordered are listed, but only abnormal results are displayed) Labs Reviewed  CBC WITH DIFFERENTIAL/PLATELET - Abnormal; Notable for the following components:      Result Value   WBC 13.0 (*)    RBC 3.30 (*)    Hemoglobin 10.2 (*)    HCT 31.2 (*)    Platelets 147 (*)    Neutro Abs 10.9 (*)    All other components within normal limits  COMPREHENSIVE METABOLIC PANEL - Abnormal; Notable for the following components:   Glucose, Bld 130 (*)    BUN 22 (*)    Creatinine, Ser 1.31 (*)    GFR calc non Af Amer 37 (*)    GFR calc Af Amer 43 (*)    All other components within normal limits  URINALYSIS, ROUTINE W REFLEX MICROSCOPIC - Abnormal; Notable for the following components:   Ketones, ur 5 (*)    Leukocytes, UA MODERATE (*)    Squamous Epithelial / LPF 0-5 (*)    All other  components within normal limits  LIPASE, BLOOD    EKG  EKG Interpretation None       Radiology Dg Abd Acute W/chest  Result Date: 04/19/2017 CLINICAL  DATA:  83 year old female with nausea and vomiting. Recent knee surgery. EXAM: DG ABDOMEN ACUTE W/ 1V CHEST COMPARISON:  Chest radiographs 11/18/2015. CT Abdomen and Pelvis 11/30/2014 and earlier. FINDINGS: Stable postoperative changes to the right axilla and chest wall. Mediastinal contours remain normal. Stable lung volumes. No pneumothorax or pneumoperitoneum. No pulmonary edema, pleural effusion or confluent pulmonary opacity. Upright and supine views of the abdomen demonstrate multiple gas and fluid containing small bowel loops at the upper limits of normal in the left and central abdomen. There is retained stool in the proximal colon. There is large bowel gas present to the rectum. Other abdominal and pelvic visceral contours appear within normal limits. No acute osseous abnormality identified. IMPRESSION: 1. Multiple air and fluid containing small bowel loops in the left and mid abdomen are at the upper limits of normal. Differential considerations include a small bowel ileus versus partial or developing small bowel obstruction. 2. No free air. 3.  No acute cardiopulmonary abnormality. Electronically Signed   By: Genevie Ann M.D.   On: 04/19/2017 23:33    Procedures Procedures (including critical care time)  Medications Ordered in ED Medications  iopamidol (ISOVUE-300) 61 % injection (not administered)  sodium chloride 0.9 % bolus 500 mL (0 mLs Intravenous Stopped 04/20/17 0023)  ondansetron (ZOFRAN) injection 4 mg (4 mg Intravenous Given 04/19/17 2310)  morphine 4 MG/ML injection 4 mg (4 mg Intravenous Given 04/20/17 0023)  iopamidol (ISOVUE-300) 61 % injection 80 mL (80 mLs Intravenous Contrast Given 04/20/17 0205)  LORazepam (ATIVAN) tablet 0.5 mg (0.5 mg Oral Given 04/20/17 0131)     Initial Impression / Assessment and Plan / ED Course  I have reviewed the triage vital signs and the nursing notes.  Pertinent labs & imaging results that were available during my care of the patient were reviewed  by me and considered in my medical decision making (see chart for details).  83 year old female presenting to the ED with abdominal pain, nausea, and vomiting.  She is about 10 days out from total left knee replacement.  Has been taking her pain medicine on empty stomach.  She is afebrile and nontoxic in appearance.  Abdomen is overall soft and benign, she has normal bowel sounds throughout.  She is requesting to eat ice chips.  Discussed with family, possibility of symptoms related to taking narcotics on empty stomach versus postop ileus or other findings such as SBO.  We will plan for small fluid bolus, Zofran, screening labs and acute abdominal series.  Patient's labs overall reassuring.  Patient feeling somewhat better after IV fluids without any further nausea but some pain recurring.  Acute abdominal series with multiple air and fluid containing small bowel loops, question of small bowel ileus versus partial or developing small bowel obstruction.  Patient and family updated.  Will obtain CT scan for further evaluation.  CT scan with mild air and fluid-filled distention of small bowel without transition point.  Possible enteritis versus ileus, I favor ileus.  She also has stool burden which is likely from her narcotic pain medications.  Results were discussed with patient.  She is feeling significantly better.  She has been able to tolerate full cup of ice chips as well as ginger ale without any further vomiting.  She feels comfortable going home.  Discussed bland diet,  small/frequent meals to help reduce meal burden on the stomach.  Also discussed starting stool softener to help with constipation.  The patient has not been handling the Vicodin all that well, states she has taken tramadol in the past which worked well for her so will start this.  She was also given prescription for Zofran in the short-term.  She understands to monitor her symptoms closely and return here for any new or worsening symptoms.   Patient and family in agreement with plan, comfortable with discharge home.  Final Clinical Impressions(s) / ED Diagnoses   Final diagnoses:  Ileus ([place])  Drug-induced constipation    ED Discharge Orders        Ordered    traMADol (ULTRAM) 50 MG tablet  Every 6 hours PRN     04/20/17 0314    ondansetron (ZOFRAN ODT) 4 MG disintegrating tablet  Every 8 hours PRN     04/20/17 0314       Larene Pickett, PA-C 04/20/17 0441    Drenda Freeze, MD 04/22/17 [phone]

## 2017-04-19 NOTE — ED Notes (Signed)
Bed: WA17 Expected date:  Expected time:  Means of arrival:  Comments: 83 yo F  abd pain, N/V, recent knee replacement

## 2017-04-19 NOTE — ED Triage Notes (Signed)
Patient BIB [facility] from home for n/v and feeling faint upon standing. Pt had left total knee replacement x1 wk. Patient has been taking oxycodone on empty stomach. Pt initial pain 10/10. Pt given 8mg  zofran and 270mcg fentanyl by EMS. Now reports relief of nausea and pain 4/10. EMS reports slight orthostatic pressure. Pt has been ambulating with walker. Hx of MI and ischemic colitis.

## 2017-04-20 ENCOUNTER — Encounter (HOSPITAL_COMMUNITY): Payer: Self-pay | Admitting: Radiology

## 2017-04-20 ENCOUNTER — Emergency Department (HOSPITAL_COMMUNITY): Payer: PPO

## 2017-04-20 DIAGNOSIS — R1110 Vomiting, unspecified: Secondary | ICD-10-CM | POA: Diagnosis not present

## 2017-04-20 DIAGNOSIS — R109 Unspecified abdominal pain: Secondary | ICD-10-CM | POA: Diagnosis not present

## 2017-04-20 MED ORDER — ONDANSETRON 4 MG PO TBDP: 4.0000 mg | ORAL_TABLET | Freq: Three times a day (TID) | ORAL | 0 refills | Status: DC | PRN

## 2017-04-20 MED ORDER — MORPHINE SULFATE (PF) 4 MG/ML IV SOLN
4.0000 mg | Freq: Once | INTRAVENOUS | Status: AC
  Administered 2017-04-20: 4 mg via INTRAVENOUS
  Filled 2017-04-20: qty 1

## 2017-04-20 MED ORDER — IOPAMIDOL (ISOVUE-300) INJECTION 61%
80.0000 mL | Freq: Once | INTRAVENOUS | Status: AC | PRN
  Administered 2017-04-20: 80 mL via INTRAVENOUS

## 2017-04-20 MED ORDER — TRAMADOL HCL 50 MG PO TABS: 50.0000 mg | ORAL_TABLET | Freq: Four times a day (QID) | ORAL | 0 refills | Status: DC | PRN

## 2017-04-20 MED ORDER — IOPAMIDOL (ISOVUE-300) INJECTION 61%
INTRAVENOUS | Status: AC
  Filled 2017-04-20: qty 100

## 2017-04-20 MED ORDER — LORAZEPAM 0.5 MG PO TABS
0.5000 mg | ORAL_TABLET | Freq: Once | ORAL | Status: AC
  Administered 2017-04-20: 0.5 mg via ORAL
  Filled 2017-04-20: qty 1

## 2017-04-20 NOTE — Discharge Instructions (Signed)
Take the prescribed medication as directed. Recommend small meals as well as start stool softener to help ease bowel movements. Follow-up with your primary care doctor. Return to the ED for new or worsening symptoms-- uncontrolled nausea/vomiting, fever, worsening pain, etc.

## 2017-04-23 DIAGNOSIS — Z7901 Long term (current) use of anticoagulants: Secondary | ICD-10-CM | POA: Diagnosis not present

## 2017-04-23 DIAGNOSIS — I4891 Unspecified atrial fibrillation: Secondary | ICD-10-CM | POA: Diagnosis not present

## 2017-04-23 DIAGNOSIS — Z471 Aftercare following joint replacement surgery: Secondary | ICD-10-CM | POA: Diagnosis not present

## 2017-04-23 DIAGNOSIS — M810 Age-related osteoporosis without current pathological fracture: Secondary | ICD-10-CM | POA: Diagnosis not present

## 2017-04-23 DIAGNOSIS — I10 Essential (primary) hypertension: Secondary | ICD-10-CM | POA: Diagnosis not present

## 2017-04-23 DIAGNOSIS — Z951 Presence of aortocoronary bypass graft: Secondary | ICD-10-CM | POA: Diagnosis not present

## 2017-04-23 DIAGNOSIS — Z853 Personal history of malignant neoplasm of breast: Secondary | ICD-10-CM | POA: Diagnosis not present

## 2017-04-23 DIAGNOSIS — D649 Anemia, unspecified: Secondary | ICD-10-CM | POA: Diagnosis not present

## 2017-04-23 DIAGNOSIS — Z96652 Presence of left artificial knee joint: Secondary | ICD-10-CM | POA: Diagnosis not present

## 2017-04-23 DIAGNOSIS — I252 Old myocardial infarction: Secondary | ICD-10-CM | POA: Diagnosis not present

## 2017-04-23 DIAGNOSIS — K559 Vascular disorder of intestine, unspecified: Secondary | ICD-10-CM | POA: Diagnosis not present

## 2017-04-23 DIAGNOSIS — G4733 Obstructive sleep apnea (adult) (pediatric): Secondary | ICD-10-CM | POA: Diagnosis not present

## 2017-04-24 DIAGNOSIS — M1712 Unilateral primary osteoarthritis, left knee: Secondary | ICD-10-CM | POA: Diagnosis not present

## 2017-04-26 ENCOUNTER — Encounter: Payer: PPO | Admitting: Gynecology

## 2017-04-29 ENCOUNTER — Other Ambulatory Visit: Payer: Self-pay | Admitting: Gynecology

## 2017-04-29 NOTE — Telephone Encounter (Signed)
Annual exam is due. Is scheduled for 07/15/17.

## 2017-05-01 DIAGNOSIS — M25562 Pain in left knee: Secondary | ICD-10-CM | POA: Diagnosis not present

## 2017-05-01 DIAGNOSIS — M25462 Effusion, left knee: Secondary | ICD-10-CM | POA: Diagnosis not present

## 2017-05-01 DIAGNOSIS — M25662 Stiffness of left knee, not elsewhere classified: Secondary | ICD-10-CM | POA: Diagnosis not present

## 2017-05-01 DIAGNOSIS — R262 Difficulty in walking, not elsewhere classified: Secondary | ICD-10-CM | POA: Diagnosis not present

## 2017-05-03 DIAGNOSIS — M25562 Pain in left knee: Secondary | ICD-10-CM | POA: Diagnosis not present

## 2017-05-03 DIAGNOSIS — M25662 Stiffness of left knee, not elsewhere classified: Secondary | ICD-10-CM | POA: Diagnosis not present

## 2017-05-03 DIAGNOSIS — M25462 Effusion, left knee: Secondary | ICD-10-CM | POA: Diagnosis not present

## 2017-05-03 DIAGNOSIS — R262 Difficulty in walking, not elsewhere classified: Secondary | ICD-10-CM | POA: Diagnosis not present

## 2017-05-08 DIAGNOSIS — M25662 Stiffness of left knee, not elsewhere classified: Secondary | ICD-10-CM | POA: Diagnosis not present

## 2017-05-08 DIAGNOSIS — M25562 Pain in left knee: Secondary | ICD-10-CM | POA: Diagnosis not present

## 2017-05-08 DIAGNOSIS — M25462 Effusion, left knee: Secondary | ICD-10-CM | POA: Diagnosis not present

## 2017-05-08 DIAGNOSIS — R262 Difficulty in walking, not elsewhere classified: Secondary | ICD-10-CM | POA: Diagnosis not present

## 2017-05-10 DIAGNOSIS — M25562 Pain in left knee: Secondary | ICD-10-CM | POA: Diagnosis not present

## 2017-05-10 DIAGNOSIS — R262 Difficulty in walking, not elsewhere classified: Secondary | ICD-10-CM | POA: Diagnosis not present

## 2017-05-10 DIAGNOSIS — M25462 Effusion, left knee: Secondary | ICD-10-CM | POA: Diagnosis not present

## 2017-05-10 DIAGNOSIS — M25662 Stiffness of left knee, not elsewhere classified: Secondary | ICD-10-CM | POA: Diagnosis not present

## 2017-05-13 DIAGNOSIS — G4733 Obstructive sleep apnea (adult) (pediatric): Secondary | ICD-10-CM | POA: Diagnosis not present

## 2017-05-15 DIAGNOSIS — R262 Difficulty in walking, not elsewhere classified: Secondary | ICD-10-CM | POA: Diagnosis not present

## 2017-05-15 DIAGNOSIS — M25462 Effusion, left knee: Secondary | ICD-10-CM | POA: Diagnosis not present

## 2017-05-15 DIAGNOSIS — M25662 Stiffness of left knee, not elsewhere classified: Secondary | ICD-10-CM | POA: Diagnosis not present

## 2017-05-15 DIAGNOSIS — M25562 Pain in left knee: Secondary | ICD-10-CM | POA: Diagnosis not present

## 2017-05-17 DIAGNOSIS — M25662 Stiffness of left knee, not elsewhere classified: Secondary | ICD-10-CM | POA: Diagnosis not present

## 2017-05-17 DIAGNOSIS — R262 Difficulty in walking, not elsewhere classified: Secondary | ICD-10-CM | POA: Diagnosis not present

## 2017-05-17 DIAGNOSIS — M25462 Effusion, left knee: Secondary | ICD-10-CM | POA: Diagnosis not present

## 2017-05-17 DIAGNOSIS — M25562 Pain in left knee: Secondary | ICD-10-CM | POA: Diagnosis not present

## 2017-05-21 DIAGNOSIS — M25462 Effusion, left knee: Secondary | ICD-10-CM | POA: Diagnosis not present

## 2017-05-21 DIAGNOSIS — R262 Difficulty in walking, not elsewhere classified: Secondary | ICD-10-CM | POA: Diagnosis not present

## 2017-05-21 DIAGNOSIS — M25662 Stiffness of left knee, not elsewhere classified: Secondary | ICD-10-CM | POA: Diagnosis not present

## 2017-05-21 DIAGNOSIS — M25562 Pain in left knee: Secondary | ICD-10-CM | POA: Diagnosis not present

## 2017-05-24 DIAGNOSIS — R262 Difficulty in walking, not elsewhere classified: Secondary | ICD-10-CM | POA: Diagnosis not present

## 2017-05-24 DIAGNOSIS — M25462 Effusion, left knee: Secondary | ICD-10-CM | POA: Diagnosis not present

## 2017-05-24 DIAGNOSIS — M25662 Stiffness of left knee, not elsewhere classified: Secondary | ICD-10-CM | POA: Diagnosis not present

## 2017-05-24 DIAGNOSIS — M25562 Pain in left knee: Secondary | ICD-10-CM | POA: Diagnosis not present

## 2017-05-27 DIAGNOSIS — M25462 Effusion, left knee: Secondary | ICD-10-CM | POA: Diagnosis not present

## 2017-05-31 DIAGNOSIS — M25462 Effusion, left knee: Secondary | ICD-10-CM | POA: Diagnosis not present

## 2017-05-31 DIAGNOSIS — R262 Difficulty in walking, not elsewhere classified: Secondary | ICD-10-CM | POA: Diagnosis not present

## 2017-05-31 DIAGNOSIS — M25662 Stiffness of left knee, not elsewhere classified: Secondary | ICD-10-CM | POA: Diagnosis not present

## 2017-05-31 DIAGNOSIS — M25562 Pain in left knee: Secondary | ICD-10-CM | POA: Diagnosis not present

## 2017-06-07 DIAGNOSIS — R262 Difficulty in walking, not elsewhere classified: Secondary | ICD-10-CM | POA: Diagnosis not present

## 2017-06-07 DIAGNOSIS — M25662 Stiffness of left knee, not elsewhere classified: Secondary | ICD-10-CM | POA: Diagnosis not present

## 2017-06-07 DIAGNOSIS — M25562 Pain in left knee: Secondary | ICD-10-CM | POA: Diagnosis not present

## 2017-06-07 DIAGNOSIS — M25462 Effusion, left knee: Secondary | ICD-10-CM | POA: Diagnosis not present

## 2017-06-10 ENCOUNTER — Other Ambulatory Visit: Payer: Self-pay

## 2017-06-10 ENCOUNTER — Ambulatory Visit (INDEPENDENT_AMBULATORY_CARE_PROVIDER_SITE_OTHER): Payer: PPO | Admitting: Physician Assistant

## 2017-06-10 ENCOUNTER — Encounter: Payer: Self-pay | Admitting: Physician Assistant

## 2017-06-10 VITALS — BP 112/72 | HR 60 | Temp 97.6°F | Wt 164.8 lb

## 2017-06-10 DIAGNOSIS — R350 Frequency of micturition: Secondary | ICD-10-CM | POA: Diagnosis not present

## 2017-06-10 DIAGNOSIS — R82998 Other abnormal findings in urine: Secondary | ICD-10-CM | POA: Diagnosis not present

## 2017-06-10 DIAGNOSIS — J0100 Acute maxillary sinusitis, unspecified: Secondary | ICD-10-CM | POA: Diagnosis not present

## 2017-06-10 LAB — POCT URINALYSIS DIP (MANUAL ENTRY)
BILIRUBIN UA: NEGATIVE
BILIRUBIN UA: NEGATIVE mg/dL
GLUCOSE UA: NEGATIVE mg/dL
NITRITE UA: NEGATIVE
PH UA: 5.5 (ref 5.0–8.0)
Protein Ur, POC: 30 mg/dL — AB
Spec Grav, UA: 1.02 (ref 1.010–1.025)
Urobilinogen, UA: 0.2 E.U./dL

## 2017-06-10 MED ORDER — SULFAMETHOXAZOLE-TRIMETHOPRIM 800-160 MG PO TABS: 1.0000 | ORAL_TABLET | Freq: Two times a day (BID) | ORAL | 0 refills | Status: DC

## 2017-06-10 NOTE — Progress Notes (Signed)
06/10/2017 at 7:10 PM  Wanda Green / DOB: [birth year] / MRN: [id number]  The patient has Breast cancer, stage 2 ([place]); Atrial fibrillation ([place]); HTN (hypertension); Other and unspecified hyperlipidemia; Allergic rhinitis due to pollen; OSA (obstructive sleep apnea); Colitis; Acute colitis; Acute coronary syndrome ([place]); Primary osteoarthritis of left knee; Colitis, ischemic ([place]); and Primary osteoarthritis of knee on their problem list.  SUBJECTIVE  Wanda Green is a 83 y.o. female who complains of dysuria, urinary frequency, cloudy malordorous urine and suprapubic pressure x 4 days. She denies hematuria, urinary urgency, flank pain, genital irritation and vaginal discharge. Has not tried anything for relief. Most recent UTI prior to this was years ago. She is not sexually active.   Also having some maxillary sinus tenderness x 3 weeks. Has had some nasal congestion and headache. Denies fever, ear pain, sore throat, and cough. Tried mucinex and zyrtec with no full relief. No hx of seasonal allergies. Has had bactrim in the past for sinus infections and it has been effective.    She  has a past medical history of Anemia, Arthritis, Breast cancer, right breast ([place]) (2000), Cervical dysplasia (age 33), Claustrophobia, Colitis, ischemic ([place]), Complication of anesthesia, Dysrhythmia, Elevated cholesterol, GERD (gastroesophageal reflux disease), Heart attack ([place]) (2016), Heart murmur, Hypertension, OSA on CPAP, Osteoporosis (06/2016), and PONV (postoperative nausea and vomiting).    Medications reviewed and updated by myself where necessary, and exist elsewhere in the encounter.   Ms. Florio is allergic to penicillins; atorvastatin; augmentin [amoxicillin-pot clavulanate]; codeine; doxycycline; hydrocodone; and oxycodone-acetaminophen. She  reports that she quit smoking about 45 years ago. Her smoking use included cigarettes. She has a 0.30 pack-year smoking history. She has never used  smokeless tobacco. She reports that she does not drink alcohol or use drugs. She  has no sexual activity history on file. The patient  has a past surgical history that includes Tubal ligation; Colposcopy; Cervical cone biopsy (age 55); Colonoscopy with propofol (N/A, 09/01/2014); Cataract extraction, bilateral; Knee arthroscopy (Bilateral); Joint replacement; Total knee arthroplasty (Left, 04/09/2017); Breast biopsy (Right, 2000); Breast lumpectomy (Right, 2000); Wrist fracture surgery (Left, ~ 2006); Fracture surgery; Cataract extraction w/ intraocular lens  implant, bilateral (Bilateral, 2018); Nasal septum surgery (12/14); Cardiac catheterization (N/A, 11/25/2014); and Total knee arthroplasty (Left, 04/09/2017).  Her family history includes Heart disease in her brother, father, and mother; Heart failure in her mother; Hypertension in her brother, father, mother, and sister; Uterine cancer in her sister.  Review of Systems  Constitutional: Negative for chills, diaphoresis and fever.  Eyes: Negative for blurred vision and double vision.  Gastrointestinal: Negative for nausea and vomiting.  Neurological: Negative for dizziness and tingling.  Psychiatric/Behavioral: Negative for memory loss.    OBJECTIVE  Her  weight is 164 lb 12.8 oz (74.8 kg). Her temperature is 97.6 F (36.4 C). Her blood pressure is 112/72 and her pulse is 60. Her oxygen saturation is 98%.  The patient's body mass index is 28.74 kg/m.  Physical Exam  Constitutional: She is oriented to person, place, and time. She appears well-developed and well-nourished. No distress.  HENT:  Head: Normocephalic and atraumatic.  Right Ear: Tympanic membrane, external ear and ear canal normal.  Left Ear: Tympanic membrane, external ear and ear canal normal.  Nose: Mucosal edema present. Right sinus exhibits maxillary sinus tenderness (mild). Right sinus exhibits no frontal sinus tenderness. Left sinus exhibits maxillary sinus tenderness ( mild).  Left sinus exhibits no frontal sinus tenderness.  Mouth/Throat: Uvula is midline,  oropharynx is clear and moist and mucous membranes are normal. No tonsillar exudate.  Eyes: Conjunctivae are normal.  Neck: Normal range of motion.  Pulmonary/Chest: Effort normal.  Abdominal: Soft. Normal appearance. There is tenderness (mild) in the suprapubic area. There is no CVA tenderness.  Lymphadenopathy:       Head (right side): No submental, no submandibular, no tonsillar, no preauricular, no posterior auricular and no occipital adenopathy present.       Head (left side): No submental, no submandibular, no tonsillar, no preauricular, no posterior auricular and no occipital adenopathy present.    She has no cervical adenopathy.       Right: No supraclavicular adenopathy present.       Left: No supraclavicular adenopathy present.  Neurological: She is alert and oriented to person, place, and time.  Skin: Skin is warm and dry.  Psychiatric: She has a normal mood and affect.  Vitals reviewed.   Results for orders placed or performed in visit on 06/10/17 (from the past 24 hour(s))  POCT urinalysis dipstick     Status: Abnormal   Collection Time: 06/10/17  5:51 PM  Result Value Ref Range   Color, UA yellow yellow   Clarity, UA cloudy (A) clear   Glucose, UA negative negative mg/dL   Bilirubin, UA negative negative   Ketones, POC UA negative negative mg/dL   Spec Grav, UA 1.020 1.010 - 1.025   Blood, UA moderate (A) negative   pH, UA 5.5 5.0 - 8.0   Protein Ur, POC =30 (A) negative mg/dL   Urobilinogen, UA 0.2 0.2 or 1.0 E.U./dL   Nitrite, UA Negative Negative   Leukocytes, UA Large (3+) (A) Negative    ASSESSMENT & PLAN  Cheron was seen today for urinary tract infection and sinusitis.  Diagnoses and all orders for this visit:  Urinary frequency -     POCT urinalysis dipstick -     Urine Culture -     Urinalysis, microscopic only  Leukocytes in urine -     sulfamethoxazole-trimethoprim  (BACTRIM DS,SEPTRA DS) 800-160 MG tablet; Take 1 tablet by mouth 2 (two) times daily.  Acute maxillary sinusitis, recurrence not specified -     sulfamethoxazole-trimethoprim (BACTRIM DS,SEPTRA DS) 800-160 MG tablet; Take 1 tablet by mouth 2 (two) times daily.   Urine cx pending. Will tx empirically for UTI and sinus infection at this time. The patient was advised to call or come back to clinic if she does not see an improvement in symptoms, or worsens with the above plan.   Tenna Delaine, PA-C  Primary Care at [facility] 06/10/2017 7:10 PM

## 2017-06-10 NOTE — Patient Instructions (Addendum)
Your results indicate you have a UTI. I have given you a prescription for an antibiotic.  Please take with food. You have had this same antibiotic in the past for sinus infection, so hopefully this can help with both.If your sinus issues persist after antibiotic, please return to office. I have sent off a urine culture and we should have those results in 48 hours. If your symptoms worsen while you are awaiting these results or you develop fever, chills, flank pian, nausea and vomiting, please seek care immediately.   Urinary Tract Infection, Adult A urinary tract infection (UTI) is an infection of any part of the urinary tract. The urinary tract includes the:  Kidneys.  Ureters.  Bladder.  Urethra.  These organs make, store, and get rid of pee (urine) in the body. Follow these instructions at home:  Take over-the-counter and prescription medicines only as told by your doctor.  If you were prescribed an antibiotic medicine, take it as told by your doctor. Do not stop taking the antibiotic even if you start to feel better.  Avoid the following drinks: ? Alcohol. ? Caffeine. ? Tea. ? Carbonated drinks.  Drink enough fluid to keep your pee clear or pale yellow.  Keep all follow-up visits as told by your doctor. This is important.  Make sure to: ? Empty your bladder often and completely. Do not to hold pee for long periods of time. ? Empty your bladder before and after sex. ? Wipe from front to back after a bowel movement if you are female. Use each tissue one time when you wipe. Contact a doctor if:  You have back pain.  You have a fever.  You feel sick to your stomach (nauseous).  You throw up (vomit).  Your symptoms do not get better after 3 days.  Your symptoms go away and then come back. Get help right away if:  You have very bad back pain.  You have very bad lower belly (abdominal) pain.  You are throwing up and cannot keep down any medicines or water. This  information is not intended to replace advice given to you by your health care provider. Make sure you discuss any questions you have with your health care provider. Document Released: 08/08/2007 Document Revised: 07/28/2015 Document Reviewed: 01/10/2015 Elsevier Interactive Patient Education  2018 [employer].   Sinusitis, Adult Sinusitis is soreness and inflammation of your sinuses. Sinuses are hollow spaces in the bones around your face. They are located:  Around your eyes.  In the middle of your forehead.  Behind your nose.  In your cheekbones.  Your sinuses and nasal passages are lined with a stringy fluid (mucus). Mucus normally drains out of your sinuses. When your nasal tissues get inflamed or swollen, the mucus can get trapped or blocked so air cannot flow through your sinuses. This lets bacteria, viruses, and funguses grow, and that leads to infection. Follow these instructions at home: Medicines  Take, use, or apply over-the-counter and prescription medicines only as told by your doctor. These may include nasal sprays.  If you were prescribed an antibiotic medicine, take it as told by your doctor. Do not stop taking the antibiotic even if you start to feel better. Hydrate and Humidify  Drink enough water to keep your pee (urine) clear or pale yellow.  Use a cool mist humidifier to keep the humidity level in your home above 50%.  Breathe in steam for 10-15 minutes, 3-4 times a day or as told by  your doctor. You can do this in the bathroom while a hot shower is running.  Try not to spend time in cool or dry air. Rest  Rest as much as possible.  Sleep with your head raised (elevated).  Make sure to get enough sleep each night. General instructions  Put a warm, moist washcloth on your face 3-4 times a day or as told by your doctor. This will help with discomfort.  Wash your hands often with soap and water. If there is no soap and water, use hand sanitizer.  Do  not smoke. Avoid being around people who are smoking (secondhand smoke).  Keep all follow-up visits as told by your doctor. This is important. Contact a doctor if:  You have a fever.  Your symptoms get worse.  Your symptoms do not get better within 10 days. Get help right away if:  You have a very bad headache.  You cannot stop throwing up (vomiting).  You have pain or swelling around your face or eyes.  You have trouble seeing.  You feel confused.  Your neck is stiff.  You have trouble breathing. This information is not intended to replace advice given to you by your health care provider. Make sure you discuss any questions you have with your health care provider. Document Released: 08/08/2007 Document Revised: 10/16/2015 Document Reviewed: 12/15/2014 Elsevier Interactive Patient Education  2018 [employer].   IF you received an x-ray today, you will receive an invoice from [facility]. Please contact [facility] at [phone] with questions or concerns regarding your invoice.   IF you received labwork today, you will receive an invoice from [facility]. Please contact [facility] at [phone] with questions or concerns regarding your invoice.   Our billing staff will not be able to assist you with questions regarding bills from these companies.  You will be contacted with the lab results as soon as they are available. The fastest way to get your results is to activate your My Chart account. Instructions are located on the last page of this paperwork. If you have not heard from [place] regarding the results in 2 weeks, please contact this office.

## 2017-06-12 LAB — URINE CULTURE

## 2017-06-13 DIAGNOSIS — G4733 Obstructive sleep apnea (adult) (pediatric): Secondary | ICD-10-CM | POA: Diagnosis not present

## 2017-06-14 DIAGNOSIS — M25662 Stiffness of left knee, not elsewhere classified: Secondary | ICD-10-CM | POA: Diagnosis not present

## 2017-06-14 DIAGNOSIS — M25562 Pain in left knee: Secondary | ICD-10-CM | POA: Diagnosis not present

## 2017-06-14 DIAGNOSIS — M25462 Effusion, left knee: Secondary | ICD-10-CM | POA: Diagnosis not present

## 2017-06-14 DIAGNOSIS — R262 Difficulty in walking, not elsewhere classified: Secondary | ICD-10-CM | POA: Diagnosis not present

## 2017-06-26 DIAGNOSIS — M1712 Unilateral primary osteoarthritis, left knee: Secondary | ICD-10-CM | POA: Diagnosis not present

## 2017-06-28 DIAGNOSIS — R262 Difficulty in walking, not elsewhere classified: Secondary | ICD-10-CM | POA: Diagnosis not present

## 2017-06-28 DIAGNOSIS — M25662 Stiffness of left knee, not elsewhere classified: Secondary | ICD-10-CM | POA: Diagnosis not present

## 2017-06-28 DIAGNOSIS — M25462 Effusion, left knee: Secondary | ICD-10-CM | POA: Diagnosis not present

## 2017-06-28 DIAGNOSIS — M25562 Pain in left knee: Secondary | ICD-10-CM | POA: Diagnosis not present

## 2017-07-05 ENCOUNTER — Encounter: Payer: PPO | Admitting: Gynecology

## 2017-07-12 DIAGNOSIS — G4733 Obstructive sleep apnea (adult) (pediatric): Secondary | ICD-10-CM | POA: Diagnosis not present

## 2017-07-12 DIAGNOSIS — I2510 Atherosclerotic heart disease of native coronary artery without angina pectoris: Secondary | ICD-10-CM | POA: Diagnosis not present

## 2017-07-12 DIAGNOSIS — E785 Hyperlipidemia, unspecified: Secondary | ICD-10-CM | POA: Diagnosis not present

## 2017-07-12 DIAGNOSIS — I480 Paroxysmal atrial fibrillation: Secondary | ICD-10-CM | POA: Diagnosis not present

## 2017-07-12 DIAGNOSIS — K219 Gastro-esophageal reflux disease without esophagitis: Secondary | ICD-10-CM | POA: Diagnosis not present

## 2017-07-12 DIAGNOSIS — F419 Anxiety disorder, unspecified: Secondary | ICD-10-CM | POA: Diagnosis not present

## 2017-07-12 DIAGNOSIS — I10 Essential (primary) hypertension: Secondary | ICD-10-CM | POA: Diagnosis not present

## 2017-07-12 DIAGNOSIS — D649 Anemia, unspecified: Secondary | ICD-10-CM | POA: Diagnosis not present

## 2017-07-12 DIAGNOSIS — M1990 Unspecified osteoarthritis, unspecified site: Secondary | ICD-10-CM | POA: Diagnosis not present

## 2017-07-13 DIAGNOSIS — G4733 Obstructive sleep apnea (adult) (pediatric): Secondary | ICD-10-CM | POA: Diagnosis not present

## 2017-07-16 ENCOUNTER — Ambulatory Visit: Payer: PPO | Admitting: Gynecology

## 2017-07-16 ENCOUNTER — Other Ambulatory Visit: Payer: PPO

## 2017-07-19 DIAGNOSIS — G4733 Obstructive sleep apnea (adult) (pediatric): Secondary | ICD-10-CM | POA: Diagnosis not present

## 2017-08-01 ENCOUNTER — Ambulatory Visit: Payer: PPO | Admitting: Physician Assistant

## 2017-08-02 ENCOUNTER — Other Ambulatory Visit: Payer: Self-pay

## 2017-08-02 ENCOUNTER — Encounter: Payer: Self-pay | Admitting: Family Medicine

## 2017-08-02 ENCOUNTER — Ambulatory Visit: Payer: PPO | Admitting: Family Medicine

## 2017-08-02 VITALS — BP 136/80 | HR 72 | Temp 97.7°F | Resp 16 | Ht 63.0 in | Wt 161.6 lb

## 2017-08-02 DIAGNOSIS — R350 Frequency of micturition: Secondary | ICD-10-CM | POA: Diagnosis not present

## 2017-08-02 DIAGNOSIS — N3001 Acute cystitis with hematuria: Secondary | ICD-10-CM

## 2017-08-02 LAB — POCT URINALYSIS DIP (MANUAL ENTRY)
Bilirubin, UA: NEGATIVE
Glucose, UA: NEGATIVE mg/dL
Ketones, POC UA: NEGATIVE mg/dL
Nitrite, UA: POSITIVE — AB
Protein Ur, POC: NEGATIVE mg/dL
Spec Grav, UA: 1.015 (ref 1.010–1.025)
Urobilinogen, UA: 0.2 E.U./dL
pH, UA: 6 (ref 5.0–8.0)

## 2017-08-02 MED ORDER — SULFAMETHOXAZOLE-TRIMETHOPRIM 800-160 MG PO TABS: 1.0000 | ORAL_TABLET | Freq: Two times a day (BID) | ORAL | 0 refills | Status: DC

## 2017-08-02 NOTE — Patient Instructions (Signed)
     IF you received an x-ray today, you will receive an invoice from [facility]. Please contact  Radiology at [phone] with questions or concerns regarding your invoice.   IF you received labwork today, you will receive an invoice from [facility]. Please contact [facility] at [phone] with questions or concerns regarding your invoice.   Our billing staff will not be able to assist you with questions regarding bills from these companies.  You will be contacted with the lab results as soon as they are available. The fastest way to get your results is to activate your My Chart account. Instructions are located on the last page of this paperwork. If you have not heard from us regarding the results in 2 weeks, please contact this office.     

## 2017-08-02 NOTE — Progress Notes (Signed)
5/31/201911:55 AM  Wanda Green 1934/07/09, 83 y.o. female [id number]  Chief Complaint  Patient presents with  . urinary frequency    w/pressure, some burning x 5 days, last seen with this on 06/10/17    HPI:   Patient is a 83 y.o. female  who presents today for 5 days of suprapubic pressure, foul odor, frequency and burning  Seen in 06/10/2017, treated with bactrim, ur cx positive for e coli She has h/o urge incontinence, not well controlled on ditropan (had to change from vesicare due to cost) Has to use pads Short staffed at work, not able to go to the restroom as ideal Prior to UTI in April, last UTI had been years ago Denies any fever, chills, nausea, vomiting, flank pain Did well with bactrim last time  Fall Risk  08/02/2017 06/10/2017 08/06/2016 04/04/2016 09/27/2015  Falls in the past year? No No No No No  Number falls in past yr: - - - - -  Injury with Fall? - - - - -  Risk for fall due to : - - - - -  Risk for fall due to: Comment - - - - -     Depression screen [facility] 2/9 08/02/2017 06/10/2017 08/06/2016  Decreased Interest 0 0 0  Down, Depressed, Hopeless 0 0 0  PHQ - 2 Score 0 0 0    Allergies  Allergen Reactions  . Penicillins Nausea And Vomiting and Other (See Comments)    Caused bleeding also PATIENT HAS HAD A PCN REACTION WITH IMMEDIATE RASH, FACIAL/TONGUE/THROAT SWELLING, SOB, OR LIGHTHEADEDNESS WITH HYPOTENSION:  #  #  #  YES  #  #  #   Has patient had a PCN reaction causing severe rash involving mucus membranes or skin necrosis: No Has patient had a PCN reaction that required hospitalization No Has patient had a PCN reaction occurring within the last 10 years: #  #  #  YES  #  #  # .   Marland Kitchen Atorvastatin Other (See Comments)    Muscle and leg myalgias  . Augmentin [Amoxicillin-Pot Clavulanate] Nausea And Vomiting and Other (See Comments)    Mouth pain  . Codeine Nausea And Vomiting  . Doxycycline Nausea And Vomiting  . Hydrocodone Nausea And Vomiting  .  Oxycodone-Acetaminophen Nausea And Vomiting    Prior to Admission medications   Medication Sig Start Date End Date Taking? Authorizing Provider  alendronate (FOSAMAX) 70 MG tablet Take 1 tablet (70 mg total) by mouth every 7 (seven) days. Take with a full glass of water on an empty stomach. 08/10/16  Yes Fontaine, Belinda Block, MD  amLODipine (NORVASC) 2.5 MG tablet Take 2.5 mg by mouth at bedtime. 11/15/14  Yes [provider]  aspirin 81 MG EC tablet Take 1 tablet (81 mg total) by mouth daily. After you have completed 5 day Rx of Apixiban (Eliquis) 04/09/17  Yes Martensen, Charna Elizabeth III, PA-C  atorvastatin (LIPITOR) 20 MG tablet Take 10 mg by mouth every 3 (three) days.    Yes [provider]  docusate sodium (COLACE) 100 MG capsule Take 1 capsule (100 mg total) by mouth 2 (two) times daily. To prevent constipation while taking pain medication. 04/09/17  Yes Prudencio Burly III, PA-C  losartan (COZAAR) 100 MG tablet Take 100 mg by mouth daily.   Yes [provider]  metoprolol succinate (TOPROL-XL) 25 MG 24 hr tablet Take 25 mg by mouth daily.    Yes [provider]  metoprolol succinate (TOPROL-XL) 50 MG 24 hr tablet Take 50 mg by mouth at bedtime.  12/23/16  Yes [provider]  omeprazole (PRILOSEC) 40 MG capsule Take 1 capsule (40 mg total) by mouth daily as needed. Patient taking differently: Take 40 mg by mouth daily as needed (for heartburn).  07/14/12  Yes Leandrew Koyanagi, MD  oxybutynin (DITROPAN) 5 MG tablet TAKE 1 TABLET (5 MG TOTAL) BY MOUTH 3 (THREE) TIMES DAILY. 04/29/17  Yes Fontaine, Belinda Block, MD  HYDROcodone-acetaminophen (NORCO/VICODIN) 5-325 MG tablet Take 1 tablet by mouth every 6 (six) hours as needed for moderate pain or severe pain.  04/19/17   [provider]    Past Medical History:  Diagnosis Date  . Anemia   . Arthritis    "knees" (04/09/2017)  . Breast cancer, right breast ([place]) 2000  . Cervical dysplasia  age 19  . Claustrophobia    "SEVERE"  . Colitis, ischemic ([place])   . Complication of anesthesia   . Dysrhythmia    hx of a-fib (when her colitis flared up 2016)  back in rhythym  . Elevated cholesterol   . GERD (gastroesophageal reflux disease)    takes prilosec as needed  . Heart attack ([place]) 2016   "mild one";  Dr Terrence Dupont  . Heart murmur   . Hypertension   . OSA on CPAP    tested  2014  . Osteoporosis 06/2016   T score -3.0  . PONV (postoperative nausea and vomiting)     Past Surgical History:  Procedure Laterality Date  . BREAST BIOPSY Right 2000  . BREAST LUMPECTOMY Right 2000  . CARDIAC CATHETERIZATION N/A 11/25/2014   Procedure: Left Heart Cath and Coronary Angiography;  Surgeon: Dixie Dials, MD;  Location: [facility];  Service: Cardiovascular;  Laterality: N/A;  . CATARACT EXTRACTION W/ INTRAOCULAR LENS  IMPLANT, BILATERAL Bilateral 2018  . CATARACT EXTRACTION, BILATERAL    . CERVICAL CONE BIOPSY  age 45  . COLONOSCOPY WITH PROPOFOL N/A 09/01/2014   Procedure: COLONOSCOPY WITH PROPOFOL;  Surgeon: Ronald Lobo, MD;  Location: [facility];  Service: Endoscopy;  Laterality: N/A;  this will be done unprepped  . COLPOSCOPY    . FRACTURE SURGERY    . JOINT REPLACEMENT    . KNEE ARTHROSCOPY Bilateral   . NASAL SEPTUM SURGERY  12/14  . TOTAL KNEE ARTHROPLASTY Left 04/09/2017  . TOTAL KNEE ARTHROPLASTY Left 04/09/2017   Procedure: LEFT TOTAL KNEE ARTHROPLASTY;  Surgeon: [name], MD;  Location: [facility];  Service: Orthopedics;  Laterality: Left;  . TUBAL LIGATION    . WRIST FRACTURE SURGERY Left ~ 2006    Social History   Tobacco Use  . Smoking status: Former Smoker    Packs/day: 0.10    Years: 3.00    Pack years: 0.30    Types: Cigarettes    Last attempt to quit: 03/05/1972    Years since quitting: 45.4  . Smokeless tobacco: Never Used  . Tobacco comment: ONE CIG A DAY  Substance Use Topics  . Alcohol use: No    Alcohol/week: 0.0 oz    Family  History  Problem Relation Age of Onset  . Hypertension Mother   . Heart failure Mother   . Heart disease Mother   . Hypertension Father   . Heart disease Father   . Hypertension Sister   . Uterine cancer Sister   . Hypertension Brother   . Heart disease Brother     ROS Per  hpi  OBJECTIVE:  Blood pressure 136/80, pulse 72, temperature 97.7 F (36.5 C), temperature source Oral, resp. rate 16, height 5\' 3"  (1.6 m), weight 161 lb 9.6 oz (73.3 kg), last menstrual period 07/22/2015, SpO2 98 %.  Physical Exam  Constitutional: She is oriented to person, place, and time. She appears well-developed and well-nourished.  HENT:  Head: Normocephalic and atraumatic.  Mouth/Throat: Oropharynx is clear and moist. No oropharyngeal exudate.  Eyes: Pupils are equal, round, and reactive to light. EOM are normal. No scleral icterus.  Neck: Neck supple.  Cardiovascular: Normal rate, regular rhythm and normal heart sounds. Exam reveals no gallop and no friction rub.  No murmur heard. Pulmonary/Chest: Effort normal and breath sounds normal. She has no wheezes. She has no rales.  Abdominal: There is no CVA tenderness.  Musculoskeletal: She exhibits no edema.  Neurological: She is alert and oriented to person, place, and time.  Skin: Skin is warm and dry.  Psychiatric: She has a normal mood and affect.  Nursing note and vitals reviewed.     Results for orders placed or performed in visit on 08/02/17 (from the past 24 hour(s))  POCT urinalysis dipstick     Status: Abnormal   Collection Time: 08/02/17 11:58 AM  Result Value Ref Range   Color, UA yellow yellow   Clarity, UA cloudy (A) clear   Glucose, UA negative negative mg/dL   Bilirubin, UA negative negative   Ketones, POC UA negative negative mg/dL   Spec Grav, UA 1.015 1.010 - 1.025   Blood, UA moderate (A) negative   pH, UA 6.0 5.0 - 8.0   Protein Ur, POC negative negative mg/dL   Urobilinogen, UA 0.2 0.2 or 1.0 E.U./dL   Nitrite, UA  Positive (A) Negative   Leukocytes, UA Large (3+) (A) Negative    ASSESSMENT and PLAN  1. Acute cystitis with hematuria Discussed supportive measures, new meds r/se/b and RTC precautions. Consider urology referral if she continues to have UTIs.  2. Urinary frequency - POCT urinalysis dipstick - Urine Culture  Other orders - sulfamethoxazole-trimethoprim (BACTRIM DS,SEPTRA DS) 800-160 MG tablet; Take 1 tablet by mouth 2 (two) times daily.  Return if symptoms worsen or fail to improve.    Rutherford Guys, MD Primary [facility] at [facility] [place], [place] [address] Ph.  [phone] Fax [phone]

## 2017-08-04 LAB — URINE CULTURE

## 2017-08-06 NOTE — Progress Notes (Signed)
On appropriate abx, no further action needed

## 2017-08-09 ENCOUNTER — Ambulatory Visit: Payer: PPO | Admitting: Adult Health

## 2017-08-13 DIAGNOSIS — G4733 Obstructive sleep apnea (adult) (pediatric): Secondary | ICD-10-CM | POA: Diagnosis not present

## 2017-08-23 ENCOUNTER — Encounter: Payer: Self-pay | Admitting: Adult Health

## 2017-08-23 ENCOUNTER — Ambulatory Visit (INDEPENDENT_AMBULATORY_CARE_PROVIDER_SITE_OTHER): Payer: PPO | Admitting: Adult Health

## 2017-08-23 DIAGNOSIS — G4733 Obstructive sleep apnea (adult) (pediatric): Secondary | ICD-10-CM

## 2017-08-23 NOTE — Addendum Note (Signed)
Addended by: Parke Poisson E on: 08/23/2017 05:19 PM   Modules accepted: Orders

## 2017-08-23 NOTE — Assessment & Plan Note (Signed)
Patient is wearing CPAP on a consistent basis but mask is coming off and leaking. Went over different mask options including DreamWear full face and nasal.  Patient would like to try this samples were given.  We will do a download in 1 month.  If control is still decreased we will consider changing to AutoSet and possibly a mask desensitization referral.  Plan  Patient Instructions  Try a new CPAP mask as discussed Try to wear CPAP each night for at least 4 hours Do not drive if sleepy Work on healthy weight Remain active CPAP download in 1 month  Follow-up in 1 year with Dr. Elsworth Soho and as needed

## 2017-08-23 NOTE — Progress Notes (Signed)
@Patient  ID: Wanda Green, female    DOB: 10/22/1934, 83 y.o.   MRN: [id number]  Chief Complaint  Patient presents with  . Follow-up    OSA     Referring provider: Charolette Forward, MD  HPI: 83 year old female followed for obstructive sleep apnea Past medical history significant for coronary artery disease and hypertension  Significant tests/ events reviewed 08/2012 HST AHI 7/h  03/2015 CPAP titration 11 cm    08/23/2017 Follow up : OSA  Presents for a one-year follow-up.  Patient has underlying sleep apnea.  She is on CPAP at bedtime.  Patient says she is recently changed mask to a full facemask and has not been as comfortable as her previous mask.  She has not been able to keep the mask on and has been having more trouble.  Download shows 93% usage however for only about 2 hours.  AHI 7.8.  With significant leaks. Says mask will not stay on during the night and leaks .    Works fulltime , very active.   Allergies  Allergen Reactions  . Penicillins Nausea And Vomiting and Other (See Comments)    Caused bleeding also PATIENT HAS HAD A PCN REACTION WITH IMMEDIATE RASH, FACIAL/TONGUE/THROAT SWELLING, SOB, OR LIGHTHEADEDNESS WITH HYPOTENSION:  #  #  #  YES  #  #  #   Has patient had a PCN reaction causing severe rash involving mucus membranes or skin necrosis: No Has patient had a PCN reaction that required hospitalization No Has patient had a PCN reaction occurring within the last 10 years: #  #  #  YES  #  #  # .   Marland Kitchen Atorvastatin Other (See Comments)    Muscle and leg myalgias  . Augmentin [Amoxicillin-Pot Clavulanate] Nausea And Vomiting and Other (See Comments)    Mouth pain  . Codeine Nausea And Vomiting  . Doxycycline Nausea And Vomiting  . Hydrocodone Nausea And Vomiting  . Oxycodone-Acetaminophen Nausea And Vomiting    There is no immunization history for the selected administration types on file for this patient.  Past Medical History:  Diagnosis Date  .  Anemia   . Arthritis    "knees" (04/09/2017)  . Breast cancer, right breast ([place]) 2000  . Cervical dysplasia age 61  . Claustrophobia    "SEVERE"  . Colitis, ischemic ([place])   . Complication of anesthesia   . Dysrhythmia    hx of a-fib (when her colitis flared up 2016)  back in rhythym  . Elevated cholesterol   . GERD (gastroesophageal reflux disease)    takes prilosec as needed  . Heart attack ([place]) 2016   "mild one";  Dr Terrence Dupont  . Heart murmur   . Hypertension   . OSA on CPAP    tested  2014  . Osteoporosis 06/2016   T score -3.0  . PONV (postoperative nausea and vomiting)     Tobacco History: Social History   Tobacco Use  Smoking Status Former Smoker  . Packs/day: 0.10  . Years: 3.00  . Pack years: 0.30  . Types: Cigarettes  . Last attempt to quit: 03/05/1972  . Years since quitting: 45.4  Smokeless Tobacco Never Used  Tobacco Comment   ONE CIG A DAY   Counseling given: Not Answered Comment: ONE CIG A DAY   Outpatient Encounter Medications as of 08/23/2017  Medication Sig  . amLODipine (NORVASC) 2.5 MG tablet Take 2.5 mg by mouth at bedtime.  Marland Kitchen aspirin 81 MG EC  tablet Take 1 tablet (81 mg total) by mouth daily. After you have completed 5 day Rx of Apixiban (Eliquis)  . atorvastatin (LIPITOR) 20 MG tablet Take 10 mg by mouth every 3 (three) days.   Marland Kitchen losartan (COZAAR) 100 MG tablet Take 100 mg by mouth daily.  . metoprolol succinate (TOPROL-XL) 25 MG 24 hr tablet Take 25 mg by mouth every morning.   . metoprolol succinate (TOPROL-XL) 50 MG 24 hr tablet Take 50 mg by mouth at bedtime.   Marland Kitchen omeprazole (PRILOSEC) 40 MG capsule Take 1 capsule (40 mg total) by mouth daily as needed. (Patient taking differently: Take 40 mg by mouth daily as needed (for heartburn). )  . oxybutynin (DITROPAN) 5 MG tablet TAKE 1 TABLET (5 MG TOTAL) BY MOUTH 3 (THREE) TIMES DAILY.  Marland Kitchen alendronate (FOSAMAX) 70 MG tablet Take 1 tablet (70 mg total) by mouth every 7 (seven) days. Take with a full  glass of water on an empty stomach. (Patient not taking: Reported on 08/23/2017)  . [DISCONTINUED] docusate sodium (COLACE) 100 MG capsule Take 1 capsule (100 mg total) by mouth 2 (two) times daily. To prevent constipation while taking pain medication. (Patient not taking: Reported on 08/23/2017)  . [DISCONTINUED] HYDROcodone-acetaminophen (NORCO/VICODIN) 5-325 MG tablet Take 1 tablet by mouth every 6 (six) hours as needed for moderate pain or severe pain.   . [DISCONTINUED] sulfamethoxazole-trimethoprim (BACTRIM DS,SEPTRA DS) 800-160 MG tablet Take 1 tablet by mouth 2 (two) times daily. (Patient not taking: Reported on 08/23/2017)   No facility-administered encounter medications on file as of 08/23/2017.      Review of Systems  Constitutional:   No  weight loss, night sweats,  Fevers, chills,  +fatigue, or  lassitude.  HEENT:   No headaches,  Difficulty swallowing,  Tooth/dental problems, or  Sore throat,                No sneezing, itching, ear ache, nasal congestion, post nasal drip,   CV:  No chest pain,  Orthopnea, PND, swelling in lower extremities, anasarca, dizziness, palpitations, syncope.   GI  No heartburn, indigestion, abdominal pain, nausea, vomiting, diarrhea, change in bowel habits, loss of appetite, bloody stools.   Resp: No shortness of breath with exertion or at rest.  No excess mucus, no productive cough,  No non-productive cough,  No coughing up of blood.  No change in color of mucus.  No wheezing.  No chest wall deformity  Skin: no rash or lesions.  GU: no dysuria, change in color of urine, no urgency or frequency.  No flank pain, no hematuria   MS:  No joint pain or swelling.  No decreased range of motion.  No back pain.    Physical Exam  BP 138/78 (BP Location: Left Arm, Cuff Size: Normal)   Pulse 85   Ht 5\' 3"  (1.6 m)   Wt 163 lb (73.9 kg)   LMP 07/22/2015   SpO2 98%   BMI 28.87 kg/m   GEN: A/Ox3; pleasant , NAD, well nourished (appears younger than  stated age)    HEENT:  [place]/AT,  EACs-clear, TMs-wnl, NOSE-clear, THROAT-clear, no lesions, no postnasal drip or exudate noted.   NECK:  Supple w/ fair ROM; no JVD; normal carotid impulses w/o bruits; no thyromegaly or nodules palpated; no lymphadenopathy.    RESP  Clear  P & A; w/o, wheezes/ rales/ or rhonchi. no accessory muscle use, no dullness to percussion  CARD:  RRR, no m/r/g, no peripheral edema, pulses intact,  no cyanosis or clubbing.  GI:   Soft & nt; nml bowel sounds; no organomegaly or masses detected.   Musco: Warm bil, no deformities or joint swelling noted.   Neuro: alert, no focal deficits noted.    Skin: Warm, no lesions or rashes    Lab Results:   BMET  BNP No results found for: BNP  ProBNP No results found for: PROBNP  Imaging: No results found.   Assessment & Plan:   OSA (obstructive sleep apnea) Patient is wearing CPAP on a consistent basis but mask is coming off and leaking. Went over different mask options including DreamWear full face and nasal.  Patient would like to try this samples were given.  We will do a download in 1 month.  If control is still decreased we will consider changing to AutoSet and possibly a mask desensitization referral.  Plan  Patient Instructions  Try a new CPAP mask as discussed Try to wear CPAP each night for at least 4 hours Do not drive if sleepy Work on healthy weight Remain active CPAP download in 1 month  Follow-up in 1 year with Dr. Elsworth Soho and as needed        Rexene Edison, NP 08/23/2017

## 2017-08-23 NOTE — Patient Instructions (Signed)
Try a new CPAP mask as discussed Try to wear CPAP each night for at least 4 hours Do not drive if sleepy Work on healthy weight Remain active CPAP download in 1 month  Follow-up in 1 year with Dr. Elsworth Soho and as needed

## 2017-08-26 NOTE — Addendum Note (Signed)
Addended by: Parke Poisson E on: 08/26/2017 05:37 PM   Modules accepted: Orders

## 2017-08-30 ENCOUNTER — Ambulatory Visit (HOSPITAL_COMMUNITY)
Admission: RE | Admit: 2017-08-30 | Discharge: 2017-08-30 | Disposition: A | Discharge status: Home / Self Care | Payer: PPO | Source: Ambulatory Visit | Attending: General Practice | Admitting: General Practice

## 2017-08-30 ENCOUNTER — Other Ambulatory Visit (HOSPITAL_COMMUNITY): Payer: Self-pay | Admitting: Orthopedic Surgery

## 2017-08-30 DIAGNOSIS — M79605 Pain in left leg: Secondary | ICD-10-CM

## 2017-08-30 DIAGNOSIS — M7989 Other specified soft tissue disorders: Secondary | ICD-10-CM | POA: Insufficient documentation

## 2017-08-30 DIAGNOSIS — M25562 Pain in left knee: Secondary | ICD-10-CM | POA: Diagnosis not present

## 2017-08-30 NOTE — Progress Notes (Signed)
*  Preliminary Results* Left lower extremity venous duplex completed. Left lower extremity is negative for deep vein thrombosis. There is no evidence of left Baker's cyst.  08/30/2017 2:31 PM  Maudry Mayhew, BS, RVT, RDCS, RDMS

## 2017-09-04 ENCOUNTER — Other Ambulatory Visit: Payer: Self-pay | Admitting: Gynecology

## 2017-09-09 ENCOUNTER — Ambulatory Visit: Payer: PPO | Admitting: Adult Health

## 2017-09-10 ENCOUNTER — Encounter: Payer: Self-pay | Admitting: Gynecology

## 2017-09-10 ENCOUNTER — Other Ambulatory Visit: Payer: Self-pay | Admitting: Gynecology

## 2017-09-10 ENCOUNTER — Ambulatory Visit: Payer: PPO

## 2017-09-10 ENCOUNTER — Ambulatory Visit (INDEPENDENT_AMBULATORY_CARE_PROVIDER_SITE_OTHER): Payer: PPO | Admitting: Gynecology

## 2017-09-10 VITALS — BP 124/78

## 2017-09-10 DIAGNOSIS — N83202 Unspecified ovarian cyst, left side: Secondary | ICD-10-CM | POA: Diagnosis not present

## 2017-09-10 DIAGNOSIS — R3915 Urgency of urination: Secondary | ICD-10-CM

## 2017-09-10 DIAGNOSIS — Z853 Personal history of malignant neoplasm of breast: Secondary | ICD-10-CM

## 2017-09-10 MED ORDER — SOLIFENACIN SUCCINATE 5 MG PO TABS: 5.0000 mg | ORAL_TABLET | Freq: Every day | ORAL | 3 refills | Status: DC

## 2017-09-10 NOTE — Patient Instructions (Signed)
Follow-up for your annual exam as you schedule.

## 2017-09-10 NOTE — Progress Notes (Signed)
    Wanda Green 1934/09/21 [id number]        83 y.o.  [id number] presents for follow-up ultrasound.  She had an ultrasound 01/2017 due to postmenopausal bleeding which showed a thin endometrial echo.  She had a follow-up endometrial biopsy that showed atrophic endometrium.  A 36 x 27 x 25 mm left adnexal echo-free avascular cyst was noted.  She was recommended for follow-up ultrasound in 3 to 6 months.  She follows up now for that.  She also had questions about her urge incontinence.  Past medical history,surgical history, problem list, medications, allergies, family history and social history were all reviewed and documented in the EPIC chart.  Directed ROS with pertinent positives and negatives documented in the history of present illness/assessment and plan.  Exam: Vitals:   09/10/17 1138  BP: 124/78   General appearance:  Normal  Ultrasound transvaginal shows uterus normal size and echotexture.  Endometrial echo 2.8 mm.  Right and left ovaries not identified.  Right adnexa normal.  Left adnexa with avascular cystic area 36 x 24 x 27 mm.  No free fluid noted.  Assessment/Plan:  83 y.o. [id number] with:  1. Persistent left adnexal avascular small simple cyst.  Reviewed differential and stability over 8 months timeframe with the patient and recommendations for no further follow-up at this time.  Highly unlikely malignancy and we are both comfortable with no further work-up. 2. Urge incontinence.  Has been on Ditropan 5 mg 3 times daily but notes her symptoms continue.  Had been on Vesicare before noted it seemed to work better.  No other symptoms to suggest UTI.  We will go ahead with Vesicare 5 mg daily and see how she does with this.  #30 with 3 refills.  She is in the process of scheduling a annual exam and will follow-up at that time and we will see how she is doing.  Will increase to 10 mg daily if she does not get an adequate response.    Anastasio Auerbach MD, 11:59 AM  09/10/2017

## 2017-09-12 DIAGNOSIS — G4733 Obstructive sleep apnea (adult) (pediatric): Secondary | ICD-10-CM | POA: Diagnosis not present

## 2017-09-23 ENCOUNTER — Encounter: Payer: Self-pay | Admitting: Adult Health

## 2017-10-11 DIAGNOSIS — F419 Anxiety disorder, unspecified: Secondary | ICD-10-CM | POA: Diagnosis not present

## 2017-10-11 DIAGNOSIS — I2510 Atherosclerotic heart disease of native coronary artery without angina pectoris: Secondary | ICD-10-CM | POA: Diagnosis not present

## 2017-10-11 DIAGNOSIS — E785 Hyperlipidemia, unspecified: Secondary | ICD-10-CM | POA: Diagnosis not present

## 2017-10-11 DIAGNOSIS — I480 Paroxysmal atrial fibrillation: Secondary | ICD-10-CM | POA: Diagnosis not present

## 2017-10-11 DIAGNOSIS — D649 Anemia, unspecified: Secondary | ICD-10-CM | POA: Diagnosis not present

## 2017-10-11 DIAGNOSIS — M1990 Unspecified osteoarthritis, unspecified site: Secondary | ICD-10-CM | POA: Diagnosis not present

## 2017-10-11 DIAGNOSIS — G4733 Obstructive sleep apnea (adult) (pediatric): Secondary | ICD-10-CM | POA: Diagnosis not present

## 2017-10-11 DIAGNOSIS — K219 Gastro-esophageal reflux disease without esophagitis: Secondary | ICD-10-CM | POA: Diagnosis not present

## 2017-10-11 DIAGNOSIS — I10 Essential (primary) hypertension: Secondary | ICD-10-CM | POA: Diagnosis not present

## 2017-10-13 DIAGNOSIS — G4733 Obstructive sleep apnea (adult) (pediatric): Secondary | ICD-10-CM | POA: Diagnosis not present

## 2017-10-15 ENCOUNTER — Telehealth: Payer: Self-pay | Admitting: Adult Health

## 2017-10-15 NOTE — Telephone Encounter (Signed)
Patient seen 08/23/17 by TP for CPAP follow up Download results not available at the time of the office visit so download was requested  Download results received from [facility] and reviewed by TP: Is the pressure too high or is she having mask issues?  This is some better but still only 37% >> 3-4 hours average usage.  She needs to increase usage and compliance.  LMOM TCB x1 to discuss with patient

## 2017-10-18 NOTE — Telephone Encounter (Signed)
Attempted to call pt. I did not receive an answer. I have left a message for pt to return our call.  

## 2017-10-18 NOTE — Telephone Encounter (Signed)
Pt is calling back [phone]

## 2017-10-22 DIAGNOSIS — G4733 Obstructive sleep apnea (adult) (pediatric): Secondary | ICD-10-CM | POA: Diagnosis not present

## 2017-10-23 ENCOUNTER — Encounter: Payer: Self-pay | Admitting: Gynecology

## 2017-10-30 NOTE — Telephone Encounter (Signed)
Called spoke with patient and discussed CPAP download results Patient stated that the pressure is fine and does not need to be adjusted  Patient did note that she was overdue for a new mask and just received this on 8/23 Patient is aware to increase usage and compliance and will call the office if anything is needed  Will sign off

## 2017-11-01 DIAGNOSIS — I10 Essential (primary) hypertension: Secondary | ICD-10-CM | POA: Diagnosis not present

## 2017-11-01 DIAGNOSIS — R300 Dysuria: Secondary | ICD-10-CM | POA: Diagnosis not present

## 2017-11-01 DIAGNOSIS — N390 Urinary tract infection, site not specified: Secondary | ICD-10-CM | POA: Diagnosis not present

## 2017-11-01 DIAGNOSIS — E785 Hyperlipidemia, unspecified: Secondary | ICD-10-CM | POA: Diagnosis not present

## 2017-11-08 ENCOUNTER — Encounter: Payer: PPO | Admitting: Gynecology

## 2017-11-13 DIAGNOSIS — G4733 Obstructive sleep apnea (adult) (pediatric): Secondary | ICD-10-CM | POA: Diagnosis not present

## 2017-12-02 DIAGNOSIS — M25561 Pain in right knee: Secondary | ICD-10-CM | POA: Diagnosis not present

## 2017-12-13 DIAGNOSIS — G4733 Obstructive sleep apnea (adult) (pediatric): Secondary | ICD-10-CM | POA: Diagnosis not present

## 2017-12-16 DIAGNOSIS — Z1231 Encounter for screening mammogram for malignant neoplasm of breast: Secondary | ICD-10-CM | POA: Diagnosis not present

## 2017-12-16 DIAGNOSIS — Z853 Personal history of malignant neoplasm of breast: Secondary | ICD-10-CM | POA: Diagnosis not present

## 2017-12-18 DIAGNOSIS — I8312 Varicose veins of left lower extremity with inflammation: Secondary | ICD-10-CM | POA: Diagnosis not present

## 2017-12-18 DIAGNOSIS — I8311 Varicose veins of right lower extremity with inflammation: Secondary | ICD-10-CM | POA: Diagnosis not present

## 2017-12-18 DIAGNOSIS — R600 Localized edema: Secondary | ICD-10-CM | POA: Diagnosis not present

## 2017-12-23 ENCOUNTER — Ambulatory Visit: Payer: PPO

## 2017-12-25 ENCOUNTER — Encounter: Payer: PPO | Admitting: Gynecology

## 2017-12-26 ENCOUNTER — Encounter: Payer: Self-pay | Admitting: Gynecology

## 2017-12-26 ENCOUNTER — Ambulatory Visit (INDEPENDENT_AMBULATORY_CARE_PROVIDER_SITE_OTHER): Payer: PPO | Admitting: Gynecology

## 2017-12-26 VITALS — BP 124/82 | Ht 63.0 in | Wt 167.0 lb

## 2017-12-26 DIAGNOSIS — N952 Postmenopausal atrophic vaginitis: Secondary | ICD-10-CM

## 2017-12-26 DIAGNOSIS — Z9189 Other specified personal risk factors, not elsewhere classified: Secondary | ICD-10-CM | POA: Diagnosis not present

## 2017-12-26 DIAGNOSIS — Z01419 Encounter for gynecological examination (general) (routine) without abnormal findings: Secondary | ICD-10-CM

## 2017-12-26 DIAGNOSIS — M810 Age-related osteoporosis without current pathological fracture: Secondary | ICD-10-CM

## 2017-12-26 DIAGNOSIS — Z9289 Personal history of other medical treatment: Secondary | ICD-10-CM | POA: Diagnosis not present

## 2017-12-26 DIAGNOSIS — N3281 Overactive bladder: Secondary | ICD-10-CM

## 2017-12-26 MED ORDER — ALENDRONATE SODIUM 70 MG PO TABS: 70.0000 mg | ORAL_TABLET | ORAL | 11 refills | Status: DC

## 2017-12-26 NOTE — Progress Notes (Signed)
    Wanda Green 16-Jun-1934 [id number]        83 y.o.  [id number] for breast and pelvic exam.  Past medical history,surgical history, problem list, medications, allergies, family history and social history were all reviewed and documented as reviewed in the EPIC chart.  ROS:  Performed with pertinent positives and negatives included in the history, assessment and plan.   Additional significant findings : None   Exam: Caryn Bee assistant Vitals:   12/26/17 1357  BP: 124/82  Weight: 167 lb (75.8 kg)  Height: 5\' 3"  (1.6 m)   Body mass index is 29.58 kg/m.  General appearance:  Normal affect, orientation and appearance. Skin: Grossly normal HEENT: Without gross lesions.  No cervical or supraclavicular adenopathy. Thyroid normal.  Lungs:  Clear without wheezing, rales or rhonchi Cardiac: RR, without RMG Abdominal:  Soft, nontender, without masses, guarding, rebound, organomegaly or hernia Breasts:  Examined lying and sitting without masses, retractions, discharge or axillary adenopathy.  Well-healed right lumpectomy scar Pelvic:  Ext, BUS, Vagina: With atrophic changes  Cervix: With atrophic changes  Uterus: Anteverted, normal size, shape and contour, midline and mobile nontender   Adnexa: Without masses or tenderness    Anus and perineum: Normal   Rectovaginal: Normal sphincter tone without palpated masses or tenderness.    Assessment/Plan:  83 y.o. [id number] female for breast and pelvic exam.   1. Postmenopausal/atrophic genital changes.  No significant menopausal symptoms or any vaginal bleeding. 2. History of right breast cancer.  Mammography 12/2017.  Exam NED. 3. Colonoscopy 2016.  Repeat at their recommended interval. 4. Pap smear 2018.  No Pap smear done today.  History of cone biopsy age 47 with normal Pap smears since.  We both agree to stop screening per current screening guidelines based on age. 5. Osteoporosis.  DEXA 2018 T score -3.  4% decline AP spine 8% left  total hip, 11% right total hip from 2011 study.  Was restarted on Fosamax last year but transiently stopped due to knee issues but has restarted it now.  Refill x1 year provided.  Plan follow-up DEXA next year at 2-year interval. 6. Detrusor instability.  Continues on oxybutynin.  Other medications were too expensive.  Has supply but will call when needs more. 7. Health maintenance.  No routine lab work done as patient does this elsewhere.  Follow-up 1 year, sooner as needed.   Anastasio Auerbach MD, 2:34 PM 12/26/2017

## 2017-12-26 NOTE — Patient Instructions (Signed)
Follow-up in 1 year for annual exam, sooner if any issues. 

## 2017-12-30 DIAGNOSIS — I8311 Varicose veins of right lower extremity with inflammation: Secondary | ICD-10-CM | POA: Diagnosis not present

## 2017-12-30 DIAGNOSIS — I8312 Varicose veins of left lower extremity with inflammation: Secondary | ICD-10-CM | POA: Diagnosis not present

## 2018-01-13 DIAGNOSIS — G4733 Obstructive sleep apnea (adult) (pediatric): Secondary | ICD-10-CM | POA: Diagnosis not present

## 2018-02-05 DIAGNOSIS — I83893 Varicose veins of bilateral lower extremities with other complications: Secondary | ICD-10-CM | POA: Diagnosis not present

## 2018-02-06 DIAGNOSIS — G4733 Obstructive sleep apnea (adult) (pediatric): Secondary | ICD-10-CM | POA: Diagnosis not present

## 2018-02-12 DIAGNOSIS — G4733 Obstructive sleep apnea (adult) (pediatric): Secondary | ICD-10-CM | POA: Diagnosis not present

## 2018-02-16 ENCOUNTER — Other Ambulatory Visit: Payer: Self-pay | Admitting: Gynecology

## 2018-03-04 DIAGNOSIS — I8312 Varicose veins of left lower extremity with inflammation: Secondary | ICD-10-CM | POA: Diagnosis not present

## 2018-03-04 DIAGNOSIS — I83892 Varicose veins of left lower extremities with other complications: Secondary | ICD-10-CM | POA: Diagnosis not present

## 2018-03-05 DIAGNOSIS — D509 Iron deficiency anemia, unspecified: Secondary | ICD-10-CM

## 2018-03-05 HISTORY — DX: Iron deficiency anemia, unspecified: D50.9

## 2018-03-15 DIAGNOSIS — G4733 Obstructive sleep apnea (adult) (pediatric): Secondary | ICD-10-CM | POA: Diagnosis not present

## 2018-03-19 DIAGNOSIS — I8312 Varicose veins of left lower extremity with inflammation: Secondary | ICD-10-CM | POA: Diagnosis not present

## 2018-04-13 DIAGNOSIS — H6503 Acute serous otitis media, bilateral: Secondary | ICD-10-CM | POA: Diagnosis not present

## 2018-04-13 DIAGNOSIS — J0140 Acute pansinusitis, unspecified: Secondary | ICD-10-CM | POA: Diagnosis not present

## 2018-04-18 DIAGNOSIS — I4891 Unspecified atrial fibrillation: Secondary | ICD-10-CM | POA: Diagnosis not present

## 2018-04-18 DIAGNOSIS — D649 Anemia, unspecified: Secondary | ICD-10-CM | POA: Diagnosis not present

## 2018-04-18 DIAGNOSIS — F419 Anxiety disorder, unspecified: Secondary | ICD-10-CM | POA: Diagnosis not present

## 2018-04-18 DIAGNOSIS — E785 Hyperlipidemia, unspecified: Secondary | ICD-10-CM | POA: Diagnosis not present

## 2018-04-18 DIAGNOSIS — K219 Gastro-esophageal reflux disease without esophagitis: Secondary | ICD-10-CM | POA: Diagnosis not present

## 2018-04-18 DIAGNOSIS — I10 Essential (primary) hypertension: Secondary | ICD-10-CM | POA: Diagnosis not present

## 2018-04-18 DIAGNOSIS — N189 Chronic kidney disease, unspecified: Secondary | ICD-10-CM | POA: Diagnosis not present

## 2018-04-18 DIAGNOSIS — G4733 Obstructive sleep apnea (adult) (pediatric): Secondary | ICD-10-CM | POA: Diagnosis not present

## 2018-04-18 DIAGNOSIS — M1990 Unspecified osteoarthritis, unspecified site: Secondary | ICD-10-CM | POA: Diagnosis not present

## 2018-04-18 DIAGNOSIS — I2510 Atherosclerotic heart disease of native coronary artery without angina pectoris: Secondary | ICD-10-CM | POA: Diagnosis not present

## 2018-04-28 ENCOUNTER — Other Ambulatory Visit: Payer: Self-pay

## 2018-04-28 ENCOUNTER — Encounter: Payer: Self-pay | Admitting: *Deleted

## 2018-04-28 ENCOUNTER — Encounter: Payer: Self-pay | Admitting: Oncology

## 2018-04-28 ENCOUNTER — Ambulatory Visit: Payer: PPO | Admitting: Oncology

## 2018-04-28 VITALS — BP 146/76 | HR 78 | Temp 98.2°F | Ht 63.5 in | Wt 171.4 lb

## 2018-04-28 DIAGNOSIS — D649 Anemia, unspecified: Secondary | ICD-10-CM | POA: Diagnosis not present

## 2018-04-28 DIAGNOSIS — Z880 Allergy status to penicillin: Secondary | ICD-10-CM

## 2018-04-28 DIAGNOSIS — Z853 Personal history of malignant neoplasm of breast: Secondary | ICD-10-CM

## 2018-04-28 DIAGNOSIS — Z881 Allergy status to other antibiotic agents status: Secondary | ICD-10-CM

## 2018-04-28 DIAGNOSIS — Z923 Personal history of irradiation: Secondary | ICD-10-CM | POA: Diagnosis not present

## 2018-04-28 DIAGNOSIS — Z08 Encounter for follow-up examination after completed treatment for malignant neoplasm: Secondary | ICD-10-CM | POA: Diagnosis not present

## 2018-04-28 DIAGNOSIS — I252 Old myocardial infarction: Secondary | ICD-10-CM

## 2018-04-28 DIAGNOSIS — R011 Cardiac murmur, unspecified: Secondary | ICD-10-CM | POA: Diagnosis not present

## 2018-04-28 DIAGNOSIS — Z8679 Personal history of other diseases of the circulatory system: Secondary | ICD-10-CM

## 2018-04-28 DIAGNOSIS — Z888 Allergy status to other drugs, medicaments and biological substances status: Secondary | ICD-10-CM

## 2018-04-28 DIAGNOSIS — I10 Essential (primary) hypertension: Secondary | ICD-10-CM | POA: Diagnosis not present

## 2018-04-28 DIAGNOSIS — G4733 Obstructive sleep apnea (adult) (pediatric): Secondary | ICD-10-CM | POA: Diagnosis not present

## 2018-04-28 DIAGNOSIS — Z8742 Personal history of other diseases of the female genital tract: Secondary | ICD-10-CM

## 2018-04-28 DIAGNOSIS — Z9221 Personal history of antineoplastic chemotherapy: Secondary | ICD-10-CM | POA: Diagnosis not present

## 2018-04-28 DIAGNOSIS — Z9989 Dependence on other enabling machines and devices: Secondary | ICD-10-CM

## 2018-04-28 DIAGNOSIS — Z8719 Personal history of other diseases of the digestive system: Secondary | ICD-10-CM

## 2018-04-28 DIAGNOSIS — Z96652 Presence of left artificial knee joint: Secondary | ICD-10-CM

## 2018-04-28 DIAGNOSIS — Z885 Allergy status to narcotic agent status: Secondary | ICD-10-CM

## 2018-04-28 DIAGNOSIS — C50911 Malignant neoplasm of unspecified site of right female breast: Secondary | ICD-10-CM

## 2018-04-28 NOTE — Patient Instructions (Signed)
You are a 20 year breast cancer survivor!!! If you need an Oncologist in the future I would recommend Dr Nicholas Lose at the cancer center. Continue with your annual breast exams and mammograms with Dr Phineas Real.  It has been a pleasure working with you! Stay well.  DrG

## 2018-04-28 NOTE — Progress Notes (Signed)
Hematology and Oncology Follow Up Visit  Wanda Green [id number] [birth year] 84 y.o. 04/28/2018 5:59 PM   Principle Diagnosis: Encounter Diagnosis  Name Primary?  . Malignant neoplasm of right breast, stage 2 ([place]) Yes  Clinical Summary: 84 year old woman with a history of stage II, 2-node positive, ER positive, cancer of the right breast initially diagnosed in January 2000, treated with lumpectomy, breast radiation, four cycles of Adriamycin, Cytoxan and Taxotere chemotherapy, five years of tamoxifen, and one year of Arimidex.    She was hospitalized on November 24, 2014 with crushing chest pain. She had a non-ST elevation MI with a peak troponin level of 2.24. Cardiac catheterization done on September 22 showed that all major coronary arteries were patent. Medical treatment was continued. She has had no subsequent events. She was presented to the emergency department again on November 30, 2014 with a recurrent episode of ischemic colitis presenting with sudden onset of left lower quadrant abdominal pain and hematochezia.  She was observed in the emergency department and appeared to be stable. Hemoglobin and her baseline. She was discharged on Cipro plus Flagyl.   Interim History:   Since last visit with me, she had a Pulmonary med eval for OSA diagnosed in 2014 & was put back on CPAP. She had a Gyn evaluation for dysfunctional bleeding  In October, 2018 with benign findings. She had a left total knee replacement in February, 2019. In June, 2019 she had a doppler for left leg swelling & pain which was negative for DVT. She has noted no new breast lumps and mammograms with no new disease 12/16/17.  The daughter who lives with her just adopted a 30 year old foster child.  Medications: reviewed  Allergies:  Allergies  Allergen Reactions  . Penicillins Nausea And Vomiting and Other (See Comments)    Caused bleeding also PATIENT HAS HAD A PCN REACTION WITH IMMEDIATE RASH,  FACIAL/TONGUE/THROAT SWELLING, SOB, OR LIGHTHEADEDNESS WITH HYPOTENSION:  #  #  #  YES  #  #  #   Has patient had a PCN reaction causing severe rash involving mucus membranes or skin necrosis: No Has patient had a PCN reaction that required hospitalization No Has patient had a PCN reaction occurring within the last 10 years: #  #  #  YES  #  #  # .   Marland Kitchen Atorvastatin Other (See Comments)    Muscle and leg myalgias  . Augmentin [Amoxicillin-Pot Clavulanate] Nausea And Vomiting and Other (See Comments)    Mouth pain  . Codeine Nausea And Vomiting  . Doxycycline Nausea And Vomiting  . Hydrocodone Nausea And Vomiting  . Oxycodone-Acetaminophen Nausea And Vomiting    Review of Systems: See interim history.  Remaining ROS negative:   Physical Exam: Blood pressure (!) 146/76, pulse 78, temperature 98.2 F (36.8 C), temperature source Oral, height 5' 3.5" (1.613 m), weight 171 lb 6.4 oz (77.7 kg), last menstrual period 07/22/2015, SpO2 99 %. Wt Readings from Last 3 Encounters:  04/28/18 171 lb 6.4 oz (77.7 kg)  12/26/17 167 lb (75.8 kg)  08/23/17 163 lb (73.9 kg)     General appearance: well nourished Caucasian woman. HENNT: Pharynx no erythema, exudate, mass, or ulcer. No thyromegaly or thyroid nodules Lymph nodes: No cervical, supraclavicular, or axillary lymphadenopathy Breasts: No abnormal skin changes, no dominant mass in either breast. Small, well healed lumpectomy scar right breast outer, lower, quadrant. Lungs: Clear to auscultation, resonant to percussion throughout Heart: Regular VQMGQQ,7/6 aortic systolic murmur, no gallop,  no rub, no click, no edema Abdomen: Soft, nontender, normal bowel sounds, no mass, no organomegaly Extremities: No edema, no calf tenderness Musculoskeletal: no joint deformities GU:  Vascular: Carotid pulses 2+, no bruits,  symmetric Neurologic: Alert, oriented, PERRLA, , cranial nerves grossly normal, motor strength 5 over 5, reflexes 1+ symmetric, upper  body coordination normal, gait normal, Skin: No rash or ecchymosis  Lab Results: CBC W/Diff    Component Value Date/Time   WBC 13.0 (H) 04/19/2017 2316   RBC 3.30 (L) 04/19/2017 2316   HGB 10.2 (L) 04/19/2017 2316   HGB 11.0 (L) 09/12/2015 1147   HGB 11.0 (L) 06/10/2012 1327   HCT 31.2 (L) 04/19/2017 2316   HCT 33.1 (L) 09/12/2015 1147   HCT 33.8 (L) 06/10/2012 1327   PLT 147 (L) 04/19/2017 2316   PLT 227 09/12/2015 1147   MCV 94.5 04/19/2017 2316   MCV 92 09/12/2015 1147   MCV 93.9 06/10/2012 1327   MCH 30.9 04/19/2017 2316   MCHC 32.7 04/19/2017 2316   RDW 14.1 04/19/2017 2316   RDW 13.5 09/12/2015 1147   RDW 13.3 06/10/2012 1327   LYMPHSABS 0.9 04/19/2017 2316   LYMPHSABS 1.4 09/12/2015 1147   LYMPHSABS 1.6 06/10/2012 1327   MONOABS 1.0 04/19/2017 2316   MONOABS 0.5 06/10/2012 1327   EOSABS 0.1 04/19/2017 2316   EOSABS 0.1 09/12/2015 1147   BASOSABS 0.0 04/19/2017 2316   BASOSABS 0.0 09/12/2015 1147   BASOSABS 0.0 06/10/2012 1327     Chemistry      Component Value Date/Time   NA 141 04/19/2017 2316   NA 140 09/12/2015 1147   NA 140 06/03/2012 1139   K 3.8 04/19/2017 2316   K 4.7 06/03/2012 1139   CL 106 04/19/2017 2316   CL 105 06/03/2012 1139   CO2 23 04/19/2017 2316   CO2 25 06/03/2012 1139   BUN 22 (H) 04/19/2017 2316   BUN 25 09/12/2015 1147   BUN 28.0 (H) 06/03/2012 1139   CREATININE 1.31 (H) 04/19/2017 2316   CREATININE 1.19 (H) 06/22/2013 1005   CREATININE 1.5 (H) 06/03/2012 1139      Component Value Date/Time   CALCIUM 9.6 04/19/2017 2316   CALCIUM 9.4 06/03/2012 1139   ALKPHOS 51 04/19/2017 2316   ALKPHOS 70 06/03/2012 1139   AST 21 04/19/2017 2316   AST 15 06/03/2012 1139   ALT 15 04/19/2017 2316   ALT 14 06/03/2012 1139   BILITOT 1.0 04/19/2017 2316   BILITOT 0.4 09/12/2015 1147   BILITOT 0.32 06/03/2012 1139       Radiological Studies: No results found.  Impression:  #1. Stage II ER positive breast cancer treated as outlined  above She is a 20 year survivor!! She will continue annual mammograms and exam through her Gyn; she does not need formal Oncology follow up.  #2. Mild chronic normochromic anemia Serum immunoglobulins normal when checked last year and no monoclonal proteins on IFE.  #3. History of recurrent colitis  #4. History of atrial fibrillation controlled on medication. Not currently on anticoagulation.  #5. Essential hypertension  #6. S/P NSTEMI  9/16  #7. DJD S/P Left TKR  CC: Patient Care Team: Charolette Forward, MD as PCP - General (Cardiology) Annia Belt, MD as Consulting Physician (Oncology)   Murriel Hopper, MD, FACP  Hematology-Oncology/Internal Medicine     2/24/20205:59 PM

## 2018-04-29 DIAGNOSIS — I8312 Varicose veins of left lower extremity with inflammation: Secondary | ICD-10-CM | POA: Diagnosis not present

## 2018-05-05 DIAGNOSIS — J0140 Acute pansinusitis, unspecified: Secondary | ICD-10-CM | POA: Diagnosis not present

## 2018-05-05 DIAGNOSIS — H6501 Acute serous otitis media, right ear: Secondary | ICD-10-CM | POA: Diagnosis not present

## 2018-05-13 DIAGNOSIS — G4733 Obstructive sleep apnea (adult) (pediatric): Secondary | ICD-10-CM | POA: Diagnosis not present

## 2018-05-14 DIAGNOSIS — G4733 Obstructive sleep apnea (adult) (pediatric): Secondary | ICD-10-CM | POA: Diagnosis not present

## 2018-05-14 DIAGNOSIS — M1990 Unspecified osteoarthritis, unspecified site: Secondary | ICD-10-CM | POA: Diagnosis not present

## 2018-05-14 DIAGNOSIS — N189 Chronic kidney disease, unspecified: Secondary | ICD-10-CM | POA: Diagnosis not present

## 2018-05-14 DIAGNOSIS — E785 Hyperlipidemia, unspecified: Secondary | ICD-10-CM | POA: Diagnosis not present

## 2018-05-14 DIAGNOSIS — I480 Paroxysmal atrial fibrillation: Secondary | ICD-10-CM | POA: Diagnosis not present

## 2018-05-14 DIAGNOSIS — D649 Anemia, unspecified: Secondary | ICD-10-CM | POA: Diagnosis not present

## 2018-05-14 DIAGNOSIS — I2510 Atherosclerotic heart disease of native coronary artery without angina pectoris: Secondary | ICD-10-CM | POA: Diagnosis not present

## 2018-05-14 DIAGNOSIS — K219 Gastro-esophageal reflux disease without esophagitis: Secondary | ICD-10-CM | POA: Diagnosis not present

## 2018-05-14 DIAGNOSIS — I10 Essential (primary) hypertension: Secondary | ICD-10-CM | POA: Diagnosis not present

## 2018-05-14 DIAGNOSIS — F419 Anxiety disorder, unspecified: Secondary | ICD-10-CM | POA: Diagnosis not present

## 2018-05-15 DIAGNOSIS — J3489 Other specified disorders of nose and nasal sinuses: Secondary | ICD-10-CM | POA: Diagnosis not present

## 2018-05-15 DIAGNOSIS — H6501 Acute serous otitis media, right ear: Secondary | ICD-10-CM | POA: Diagnosis not present

## 2018-06-17 DIAGNOSIS — G4733 Obstructive sleep apnea (adult) (pediatric): Secondary | ICD-10-CM | POA: Diagnosis not present

## 2018-07-09 ENCOUNTER — Other Ambulatory Visit: Payer: Self-pay | Admitting: Gynecology

## 2018-07-14 DIAGNOSIS — J329 Chronic sinusitis, unspecified: Secondary | ICD-10-CM | POA: Diagnosis not present

## 2018-07-14 DIAGNOSIS — J310 Chronic rhinitis: Secondary | ICD-10-CM | POA: Diagnosis not present

## 2018-07-25 DIAGNOSIS — I10 Essential (primary) hypertension: Secondary | ICD-10-CM | POA: Diagnosis not present

## 2018-07-25 DIAGNOSIS — I480 Paroxysmal atrial fibrillation: Secondary | ICD-10-CM | POA: Diagnosis not present

## 2018-07-25 DIAGNOSIS — G4733 Obstructive sleep apnea (adult) (pediatric): Secondary | ICD-10-CM | POA: Diagnosis not present

## 2018-07-25 DIAGNOSIS — N189 Chronic kidney disease, unspecified: Secondary | ICD-10-CM | POA: Diagnosis not present

## 2018-07-25 DIAGNOSIS — E785 Hyperlipidemia, unspecified: Secondary | ICD-10-CM | POA: Diagnosis not present

## 2018-07-25 DIAGNOSIS — K219 Gastro-esophageal reflux disease without esophagitis: Secondary | ICD-10-CM | POA: Diagnosis not present

## 2018-07-25 DIAGNOSIS — F419 Anxiety disorder, unspecified: Secondary | ICD-10-CM | POA: Diagnosis not present

## 2018-07-25 DIAGNOSIS — I2510 Atherosclerotic heart disease of native coronary artery without angina pectoris: Secondary | ICD-10-CM | POA: Diagnosis not present

## 2018-07-25 DIAGNOSIS — M1990 Unspecified osteoarthritis, unspecified site: Secondary | ICD-10-CM | POA: Diagnosis not present

## 2018-07-25 DIAGNOSIS — D649 Anemia, unspecified: Secondary | ICD-10-CM | POA: Diagnosis not present

## 2018-08-20 DIAGNOSIS — R600 Localized edema: Secondary | ICD-10-CM | POA: Diagnosis not present

## 2018-08-20 DIAGNOSIS — I8312 Varicose veins of left lower extremity with inflammation: Secondary | ICD-10-CM | POA: Diagnosis not present

## 2018-08-20 DIAGNOSIS — I8311 Varicose veins of right lower extremity with inflammation: Secondary | ICD-10-CM | POA: Diagnosis not present

## 2018-09-03 DIAGNOSIS — I8312 Varicose veins of left lower extremity with inflammation: Secondary | ICD-10-CM | POA: Diagnosis not present

## 2018-09-03 DIAGNOSIS — I8311 Varicose veins of right lower extremity with inflammation: Secondary | ICD-10-CM | POA: Diagnosis not present

## 2018-09-03 DIAGNOSIS — I83893 Varicose veins of bilateral lower extremities with other complications: Secondary | ICD-10-CM | POA: Diagnosis not present

## 2018-09-08 DIAGNOSIS — J329 Chronic sinusitis, unspecified: Secondary | ICD-10-CM | POA: Diagnosis not present

## 2018-09-08 DIAGNOSIS — J310 Chronic rhinitis: Secondary | ICD-10-CM | POA: Diagnosis not present

## 2018-09-16 DIAGNOSIS — G4733 Obstructive sleep apnea (adult) (pediatric): Secondary | ICD-10-CM | POA: Diagnosis not present

## 2018-09-17 DIAGNOSIS — R600 Localized edema: Secondary | ICD-10-CM | POA: Diagnosis not present

## 2018-09-17 DIAGNOSIS — I87323 Chronic venous hypertension (idiopathic) with inflammation of bilateral lower extremity: Secondary | ICD-10-CM | POA: Diagnosis not present

## 2018-10-21 DIAGNOSIS — I8311 Varicose veins of right lower extremity with inflammation: Secondary | ICD-10-CM | POA: Diagnosis not present

## 2018-10-29 DIAGNOSIS — E785 Hyperlipidemia, unspecified: Secondary | ICD-10-CM | POA: Diagnosis not present

## 2018-10-29 DIAGNOSIS — I10 Essential (primary) hypertension: Secondary | ICD-10-CM | POA: Diagnosis not present

## 2018-10-29 DIAGNOSIS — D649 Anemia, unspecified: Secondary | ICD-10-CM | POA: Diagnosis not present

## 2018-10-30 ENCOUNTER — Encounter (HOSPITAL_COMMUNITY): Payer: Self-pay | Admitting: Emergency Medicine

## 2018-10-30 ENCOUNTER — Observation Stay (HOSPITAL_COMMUNITY)
Admission: EM | Admit: 2018-10-30 | Discharge: 2018-10-31 | Disposition: A | Discharge status: Home / Self Care | Payer: PPO | Attending: Internal Medicine | Admitting: Internal Medicine

## 2018-10-30 ENCOUNTER — Other Ambulatory Visit: Payer: Self-pay

## 2018-10-30 DIAGNOSIS — Z87891 Personal history of nicotine dependence: Secondary | ICD-10-CM | POA: Insufficient documentation

## 2018-10-30 DIAGNOSIS — Z880 Allergy status to penicillin: Secondary | ICD-10-CM | POA: Diagnosis not present

## 2018-10-30 DIAGNOSIS — G4733 Obstructive sleep apnea (adult) (pediatric): Secondary | ICD-10-CM | POA: Insufficient documentation

## 2018-10-30 DIAGNOSIS — E785 Hyperlipidemia, unspecified: Secondary | ICD-10-CM | POA: Diagnosis not present

## 2018-10-30 DIAGNOSIS — I10 Essential (primary) hypertension: Secondary | ICD-10-CM | POA: Diagnosis not present

## 2018-10-30 DIAGNOSIS — K559 Vascular disorder of intestine, unspecified: Secondary | ICD-10-CM | POA: Diagnosis not present

## 2018-10-30 DIAGNOSIS — Z8249 Family history of ischemic heart disease and other diseases of the circulatory system: Secondary | ICD-10-CM | POA: Diagnosis not present

## 2018-10-30 DIAGNOSIS — M170 Bilateral primary osteoarthritis of knee: Secondary | ICD-10-CM | POA: Diagnosis not present

## 2018-10-30 DIAGNOSIS — M810 Age-related osteoporosis without current pathological fracture: Secondary | ICD-10-CM | POA: Diagnosis not present

## 2018-10-30 DIAGNOSIS — I252 Old myocardial infarction: Secondary | ICD-10-CM | POA: Insufficient documentation

## 2018-10-30 DIAGNOSIS — D649 Anemia, unspecified: Secondary | ICD-10-CM | POA: Diagnosis not present

## 2018-10-30 DIAGNOSIS — Z96652 Presence of left artificial knee joint: Secondary | ICD-10-CM | POA: Diagnosis not present

## 2018-10-30 DIAGNOSIS — Z20828 Contact with and (suspected) exposure to other viral communicable diseases: Secondary | ICD-10-CM | POA: Insufficient documentation

## 2018-10-30 DIAGNOSIS — Z7982 Long term (current) use of aspirin: Secondary | ICD-10-CM | POA: Insufficient documentation

## 2018-10-30 DIAGNOSIS — Z881 Allergy status to other antibiotic agents status: Secondary | ICD-10-CM | POA: Diagnosis not present

## 2018-10-30 DIAGNOSIS — Z853 Personal history of malignant neoplasm of breast: Secondary | ICD-10-CM | POA: Diagnosis not present

## 2018-10-30 DIAGNOSIS — I2510 Atherosclerotic heart disease of native coronary artery without angina pectoris: Secondary | ICD-10-CM | POA: Insufficient documentation

## 2018-10-30 DIAGNOSIS — Z885 Allergy status to narcotic agent status: Secondary | ICD-10-CM | POA: Diagnosis not present

## 2018-10-30 DIAGNOSIS — Z0184 Encounter for antibody response examination: Secondary | ICD-10-CM | POA: Diagnosis not present

## 2018-10-30 DIAGNOSIS — R011 Cardiac murmur, unspecified: Secondary | ICD-10-CM | POA: Diagnosis not present

## 2018-10-30 DIAGNOSIS — Z888 Allergy status to other drugs, medicaments and biological substances status: Secondary | ICD-10-CM | POA: Insufficient documentation

## 2018-10-30 DIAGNOSIS — D509 Iron deficiency anemia, unspecified: Secondary | ICD-10-CM | POA: Diagnosis not present

## 2018-10-30 DIAGNOSIS — Z79899 Other long term (current) drug therapy: Secondary | ICD-10-CM | POA: Diagnosis not present

## 2018-10-30 DIAGNOSIS — K219 Gastro-esophageal reflux disease without esophagitis: Secondary | ICD-10-CM | POA: Insufficient documentation

## 2018-10-30 DIAGNOSIS — Z03818 Encounter for observation for suspected exposure to other biological agents ruled out: Secondary | ICD-10-CM | POA: Diagnosis not present

## 2018-10-30 LAB — CBC
HCT: 22.4 % — ABNORMAL LOW (ref 36.0–46.0)
Hemoglobin: 6.2 g/dL — CL (ref 12.0–15.0)
MCH: 21.6 pg — ABNORMAL LOW (ref 26.0–34.0)
MCHC: 27.7 g/dL — ABNORMAL LOW (ref 30.0–36.0)
MCV: 78 fL — ABNORMAL LOW (ref 80.0–100.0)
Platelets: 258 10*3/uL (ref 150–400)
RBC: 2.87 MIL/uL — ABNORMAL LOW (ref 3.87–5.11)
RDW: 17.2 % — ABNORMAL HIGH (ref 11.5–15.5)
WBC: 6.1 10*3/uL (ref 4.0–10.5)
nRBC: 0 % (ref 0.0–0.2)

## 2018-10-30 LAB — COMPREHENSIVE METABOLIC PANEL
ALT: 11 U/L (ref 0–44)
AST: 13 U/L — ABNORMAL LOW (ref 15–41)
Albumin: 4.1 g/dL (ref 3.5–5.0)
Alkaline Phosphatase: 53 U/L (ref 38–126)
Anion gap: 11 (ref 5–15)
BUN: 33 mg/dL — ABNORMAL HIGH (ref 8–23)
CO2: 20 mmol/L — ABNORMAL LOW (ref 22–32)
Calcium: 9.2 mg/dL (ref 8.9–10.3)
Chloride: 107 mmol/L (ref 98–111)
Creatinine, Ser: 1.61 mg/dL — ABNORMAL HIGH (ref 0.44–1.00)
GFR calc Af Amer: 34 mL/min — ABNORMAL LOW (ref >60)
GFR calc non Af Amer: 30 mL/min — ABNORMAL LOW (ref >60)
Glucose, Bld: 110 mg/dL — ABNORMAL HIGH (ref 70–99)
Potassium: 4.4 mmol/L (ref 3.5–5.1)
Sodium: 138 mmol/L (ref 135–145)
Total Bilirubin: 0.6 mg/dL (ref 0.3–1.2)
Total Protein: 7.5 g/dL (ref 6.5–8.1)

## 2018-10-30 LAB — PREPARE RBC (CROSSMATCH)

## 2018-10-30 LAB — POC OCCULT BLOOD, ED: Fecal Occult Bld: NEGATIVE

## 2018-10-30 MED ORDER — LOSARTAN POTASSIUM 50 MG PO TABS
100.0000 mg | ORAL_TABLET | Freq: Every day | ORAL | Status: DC
  Administered 2018-10-31: 10:00:00 100 mg via ORAL
  Filled 2018-10-30: qty 2

## 2018-10-30 MED ORDER — SODIUM CHLORIDE 0.9% IV SOLUTION
Freq: Once | INTRAVENOUS | Status: AC
  Administered 2018-10-30: via INTRAVENOUS

## 2018-10-30 MED ORDER — AMLODIPINE BESYLATE 2.5 MG PO TABS: 2.5000 mg | ORAL_TABLET | Freq: Every day | ORAL | Status: DC

## 2018-10-30 MED ORDER — METOPROLOL SUCCINATE ER 25 MG PO TB24: 25.0000 mg | ORAL_TABLET | ORAL | Status: DC

## 2018-10-30 MED ORDER — METOPROLOL SUCCINATE ER 25 MG PO TB24: 50.0000 mg | ORAL_TABLET | Freq: Every day | ORAL | Status: DC

## 2018-10-30 MED ORDER — SODIUM CHLORIDE 0.9 % IV SOLN
10.0000 mL/h | Freq: Once | INTRAVENOUS | Status: AC
  Administered 2018-10-30: 10 mL/h via INTRAVENOUS

## 2018-10-30 MED ORDER — METOPROLOL SUCCINATE ER 25 MG PO TB24
25.0000 mg | ORAL_TABLET | Freq: Every day | ORAL | Status: DC
  Filled 2018-10-30: qty 1

## 2018-10-30 MED ORDER — OXYBUTYNIN CHLORIDE 5 MG PO TABS
5.0000 mg | ORAL_TABLET | Freq: Three times a day (TID) | ORAL | Status: DC
  Administered 2018-10-31 (×2): 5 mg via ORAL
  Filled 2018-10-30 (×2): qty 1

## 2018-10-30 NOTE — ED Provider Notes (Addendum)
[name] EMERGENCY DEPARTMENT Provider Note   CSN: [id number] Arrival date & time: 10/30/18  1824     History   Chief Complaint Chief Complaint  Patient presents with  . Abnormal Lab    Hgb 6.1    HPI Wanda Green is a 84 y.o. female past medical history of hypertension, ischemic colitis who presents after PCP found hemoglobin of 6.1 -patient reports that she gets yearly blood work from her PCP.  Patient reports approximately 4-week history of feeling tired but thought this was due to her obstructive sleep apnea.  Patient does report history of ischemic colitis 2 years ago where she had rectal bleeding and abdominal pain which required hospitalization.  Patient reports that she has had 3 attacks of similar pain over the past year but the last episode abdominal pain was 4 weeks ago.  Patient reports using NSAIDs approximately once or twice every few months, denies alcohol usage, vaginal bleeding, urinary bleeding, rectal bleeding.  Patient denies chest pain, shortness of breath, nausea, vomiting, current abdominal pain.  Patient does not take iron supplementation.  Patient reports following with [facility], last colonoscopy was 4 years ago.     HPI  Past Medical History:  Diagnosis Date  . Anemia   . Arthritis    "knees" (04/09/2017)  . Breast cancer, right breast ([place]) 2000  . Cervical dysplasia age 26  . Claustrophobia    "SEVERE"  . Colitis, ischemic ([place])   . Complication of anesthesia   . Dysrhythmia    hx of a-fib (when her colitis flared up 2016)  back in rhythym  . Elevated cholesterol   . GERD (gastroesophageal reflux disease)    takes prilosec as needed  . Heart attack ([place]) 2016   "mild one";  Dr Terrence Dupont  . Heart murmur   . Hypertension   . OSA on CPAP    tested  2014  . Osteoporosis 06/2016   T score -3.0  . PONV (postoperative nausea and vomiting)     Patient Active Problem List   Diagnosis Date Noted  . Primary osteoarthritis of  knee 04/09/2017  . Colitis, ischemic ([place]) 03/18/2017  . Primary osteoarthritis of left knee 01/28/2017  . Acute coronary syndrome ([place]) 11/24/2014  . Colitis 08/31/2014  . Acute colitis 08/31/2014  . OSA (obstructive sleep apnea) 08/12/2012  . Atrial fibrillation ([place]) 06/24/2012  . HTN (hypertension) 06/24/2012  . Other and unspecified hyperlipidemia 06/24/2012  . Allergic rhinitis due to pollen 06/24/2012  . Breast cancer, stage 2 ([place]) 06/05/2011    Past Surgical History:  Procedure Laterality Date  . BREAST BIOPSY Right 2000  . BREAST LUMPECTOMY Right 2000  . CARDIAC CATHETERIZATION N/A 11/25/2014   Procedure: Left Heart Cath and Coronary Angiography;  Surgeon: Dixie Dials, MD;  Location: [facility];  Service: Cardiovascular;  Laterality: N/A;  . CATARACT EXTRACTION W/ INTRAOCULAR LENS  IMPLANT, BILATERAL Bilateral 2018  . CATARACT EXTRACTION, BILATERAL    . CERVICAL CONE BIOPSY  age 60  . COLONOSCOPY WITH PROPOFOL N/A 09/01/2014   Procedure: COLONOSCOPY WITH PROPOFOL;  Surgeon: Ronald Lobo, MD;  Location: [facility];  Service: Endoscopy;  Laterality: N/A;  this will be done unprepped  . COLPOSCOPY    . FRACTURE SURGERY    . JOINT REPLACEMENT    . KNEE ARTHROSCOPY Bilateral   . NASAL SEPTUM SURGERY  12/14  . TOTAL KNEE ARTHROPLASTY Left 04/09/2017  . TOTAL KNEE ARTHROPLASTY Left 04/09/2017   Procedure: LEFT  TOTAL KNEE ARTHROPLASTY;  Surgeon: [name], MD;  Location: [facility];  Service: Orthopedics;  Laterality: Left;  . TUBAL LIGATION    . WRIST FRACTURE SURGERY Left ~ 2006     OB History    Gravida  3   Para  3   Term  3   Preterm      AB      Living  3     SAB      TAB      Ectopic      Multiple      Live Births               Home Medications    Prior to Admission medications   Medication Sig Start Date End Date Taking? Authorizing Provider  alendronate (FOSAMAX) 70 MG tablet Take 1 tablet (70 mg total) by mouth every 7  (seven) days. Take with a full glass of water on an empty stomach. 12/26/17   Fontaine, Belinda Block, MD  amLODipine (NORVASC) 2.5 MG tablet Take 2.5 mg by mouth at bedtime. 11/15/14   [provider]  aspirin 81 MG EC tablet Take 1 tablet (81 mg total) by mouth daily. After you have completed 5 day Rx of Apixiban (Eliquis) 04/09/17   Martensen, Charna Elizabeth III, PA-C  atorvastatin (LIPITOR) 20 MG tablet Take 10 mg by mouth every 3 (three) days.     [provider]  losartan (COZAAR) 100 MG tablet Take 100 mg by mouth daily.    [provider]  metoprolol succinate (TOPROL-XL) 25 MG 24 hr tablet Take 25 mg by mouth every morning.     [provider]  metoprolol succinate (TOPROL-XL) 50 MG 24 hr tablet Take 50 mg by mouth at bedtime.  12/23/16   [provider]  omeprazole (PRILOSEC) 40 MG capsule Take 1 capsule (40 mg total) by mouth daily as needed. Patient taking differently: Take 40 mg by mouth daily as needed (for heartburn).  07/14/12   Leandrew Koyanagi, MD  oxybutynin (DITROPAN) 5 MG tablet TAKE 1 TABLET BY MOUTH THREE TIMES A DAY 07/10/18   Fontaine, Belinda Block, MD    Family History Family History  Problem Relation Age of Onset  . Hypertension Mother   . Heart failure Mother   . Heart disease Mother   . Hypertension Father   . Heart disease Father   . Hypertension Sister   . Uterine cancer Sister   . Hypertension Brother   . Heart disease Brother     Social History Social History   Tobacco Use  . Smoking status: Former Smoker    Packs/day: 0.10    Years: 3.00    Pack years: 0.30    Types: Cigarettes    Quit date: 03/05/1972    Years since quitting: 46.6  . Smokeless tobacco: Never Used  . Tobacco comment: ONE CIG A DAY  Substance Use Topics  . Alcohol use: No    Alcohol/week: 0.0 standard drinks  . Drug use: No     Allergies   Penicillins, Atorvastatin, Augmentin [amoxicillin-pot clavulanate], Codeine, Doxycycline, Hydrocodone,  and Oxycodone-acetaminophen   Review of Systems Review of Systems  Constitutional: Negative for chills and fever.  Respiratory: Negative for cough and shortness of breath.   Cardiovascular: Negative for chest pain.  Gastrointestinal: Negative for abdominal pain, diarrhea, nausea and vomiting.  Genitourinary: Negative for dysuria, hematuria and vaginal bleeding.  All other systems reviewed and are negative.    Physical  Exam Updated Vital Signs BP (!) 102/91   Pulse 72   Temp 98.9 F (37.2 C) (Oral)   Resp 18   LMP 07/22/2015   SpO2 100%   Physical Exam Constitutional:      Appearance: She is well-developed.  HENT:     Head: Normocephalic and atraumatic.  Eyes:     Extraocular Movements: Extraocular movements intact.  Cardiovascular:     Rate and Rhythm: Normal rate and regular rhythm.     Heart sounds: No murmur. No friction rub. No gallop.   Pulmonary:     Breath sounds: Normal breath sounds. No wheezing, rhonchi or rales.  Abdominal:     General: Abdomen is flat. There is no distension.     Palpations: Abdomen is soft. There is no mass.     Tenderness: There is no abdominal tenderness.  Genitourinary:    Rectum: Normal.     Comments: No blood or melena in rectal vault. Fecal occult blood negative Musculoskeletal:        General: No swelling or tenderness.  Skin:    General: Skin is warm and dry.  Neurological:     Mental Status: She is alert.  Psychiatric:        Mood and Affect: Mood normal.        Behavior: Behavior normal.    Rectal exam conducted following patient consent and with Nurse tech present  ED Treatments / Results  Labs (all labs ordered are listed, but only abnormal results are displayed) Labs Reviewed  COMPREHENSIVE METABOLIC PANEL - Abnormal; Notable for the following components:      Result Value   CO2 20 (*)    Glucose, Bld 110 (*)    BUN 33 (*)    Creatinine, Ser 1.61 (*)    AST 13 (*)    GFR calc non Af Amer 30 (*)    GFR calc  Af Amer 34 (*)    All other components within normal limits  CBC - Abnormal; Notable for the following components:   RBC 2.87 (*)    Hemoglobin 6.2 (*)    HCT 22.4 (*)    MCV 78.0 (*)    MCH 21.6 (*)    MCHC 27.7 (*)    RDW 17.2 (*)    All other components within normal limits  POC OCCULT BLOOD, ED  TYPE AND SCREEN  PREPARE RBC (CROSSMATCH)    EKG None  Radiology No results found.  Procedures Procedures (including critical care time)  Medications Ordered in ED Medications  0.9 %  sodium chloride infusion (has no administration in time range)     Initial Impression / Assessment and Plan / ED Course  I have reviewed the triage vital signs and the nursing notes.  Pertinent labs & imaging results that were available during my care of the patient were reviewed by me and considered in my medical decision making (see chart for details).        Wanda Green is an 84 year old female who presents with minimally symptomatic anemia found incidentally by primary care.  Hemoglobin of 6.2 stable from PCP measurement of 6.1 - baseline hemoglobin of 10.2 in February.  Patient without signs of active bleeding and is hemodynamically stable.  Fecal occult blood negative.  Patient has history of ischemic colitis but is currently without abdominal pain and most recent episode of abdominal pain was 4 weeks ago.  Patient will require admission for work-up of anemia. Patient consented and transfused 1 unit of  blood.  Case discussed with hospitalist for admission.  Final Clinical Impressions(s) / ED Diagnoses   Final diagnoses:  Symptomatic anemia    ED Discharge Orders    None       Jeanmarie Hubert, MD 10/30/18 2115    Jeanmarie Hubert, MD 10/30/18 2124    Rex Kras Wenda Overland, MD 11/01/18 [phone]

## 2018-10-30 NOTE — ED Notes (Signed)
Lab called critical value Hemoglobin 6.2 reported to Dr Rex Kras.

## 2018-10-30 NOTE — ED Notes (Signed)
ED TO INPATIENT HANDOFF REPORT  ED Nurse Name and Phone #:  [phone]  S Name/Age/Gender [name] 84 y.o. female Room/Bed: 029C/029C  Code Status   Code Status: Prior  Home/SNF/Other Home Patient oriented to: self, place, time and situation Is this baseline? Yes   Triage Complete: Triage complete  Chief Complaint low hemoglobin sent by PCP  Triage Note Patient sent by PCP for hemoglobin 6.1 - drawn yesterday. Endorsing generalized weakness. Patient has hx ischemic colitis and endorses abdominal pain, N/V/D that occurred and resolved about a week ago, denies any since. No recent dark or bloody stools. Denies shortness of breath.   Allergies Allergies  Allergen Reactions  . Penicillins Nausea And Vomiting and Other (See Comments)    Caused bleeding also PATIENT HAS HAD A PCN REACTION WITH IMMEDIATE RASH, FACIAL/TONGUE/THROAT SWELLING, SOB, OR LIGHTHEADEDNESS WITH HYPOTENSION:  #  #  #  YES  #  #  #   Has patient had a PCN reaction causing severe rash involving mucus membranes or skin necrosis: No Has patient had a PCN reaction that required hospitalization No Has patient had a PCN reaction occurring within the last 10 years: #  #  #  YES  #  #  # .   Marland Kitchen Atorvastatin Other (See Comments)    Muscle and leg myalgias  . Augmentin [Amoxicillin-Pot Clavulanate] Nausea And Vomiting and Other (See Comments)    Mouth pain  . Codeine Nausea And Vomiting  . Doxycycline Nausea And Vomiting  . Hydrocodone Nausea And Vomiting  . Oxycodone-Acetaminophen Nausea And Vomiting    Level of Care/Admitting Diagnosis ED Disposition    ED Disposition Condition [facility] Area: [place] [[id number]]  Level of Care: Telemetry Medical [104]  I expect the patient will be discharged within 24 hours: No (not a candidate for 5C-Observation unit)  Covid Evaluation: Asymptomatic Screening Protocol (No Symptoms)  Diagnosis: Symptomatic anemia [id number]  Admitting  Physician: Bonnell Public [[id number]]  Attending Physician: Dana Allan I [[id number]]  PT Class (Do Not Modify): Observation [104]  PT Acc Code (Do Not Modify): Observation [10022]       B Medical/Surgery History Past Medical History:  Diagnosis Date  . Anemia   . Arthritis    "knees" (04/09/2017)  . Breast cancer, right breast ([place]) 2000  . Cervical dysplasia age 58  . Claustrophobia    "SEVERE"  . Colitis, ischemic ([place])   . Complication of anesthesia   . Dysrhythmia    hx of a-fib (when her colitis flared up 2016)  back in rhythym  . Elevated cholesterol   . GERD (gastroesophageal reflux disease)    takes prilosec as needed  . Heart attack ([place]) 2016   "mild one";  Dr Terrence Dupont  . Heart murmur   . Hypertension   . OSA on CPAP    tested  2014  . Osteoporosis 06/2016   T score -3.0  . PONV (postoperative nausea and vomiting)    Past Surgical History:  Procedure Laterality Date  . BREAST BIOPSY Right 2000  . BREAST LUMPECTOMY Right 2000  . CARDIAC CATHETERIZATION N/A 11/25/2014   Procedure: Left Heart Cath and Coronary Angiography;  Surgeon: Dixie Dials, MD;  Location: [facility];  Service: Cardiovascular;  Laterality: N/A;  . CATARACT EXTRACTION W/ INTRAOCULAR LENS  IMPLANT, BILATERAL Bilateral 2018  . CATARACT EXTRACTION, BILATERAL    . CERVICAL CONE BIOPSY  age 84  . COLONOSCOPY WITH PROPOFOL  N/A 09/01/2014   Procedure: COLONOSCOPY WITH PROPOFOL;  Surgeon: Ronald Lobo, MD;  Location: [facility];  Service: Endoscopy;  Laterality: N/A;  this will be done unprepped  . COLPOSCOPY    . FRACTURE SURGERY    . JOINT REPLACEMENT    . KNEE ARTHROSCOPY Bilateral   . NASAL SEPTUM SURGERY  12/14  . TOTAL KNEE ARTHROPLASTY Left 04/09/2017  . TOTAL KNEE ARTHROPLASTY Left 04/09/2017   Procedure: LEFT TOTAL KNEE ARTHROPLASTY;  Surgeon: [name], MD;  Location: [facility];  Service: Orthopedics;  Laterality: Left;  . TUBAL LIGATION    . WRIST FRACTURE SURGERY Left  ~ 2006     A IV Location/Drains/Wounds Patient Lines/Drains/Airways Status   Active Line/Drains/Airways    Name:   Placement date:   Placement time:   Site:   Days:   Peripheral IV 10/30/18 Right Wrist   10/30/18    2100    Wrist   less than 1   Incision (Closed) 04/09/17 Knee Left   04/09/17    0905     569          Intake/Output Last 24 hours No intake or output data in the 24 hours ending 10/30/18 2226  Labs/Imaging Results for orders placed or performed during the hospital encounter of 10/30/18 (from the past 48 hour(s))  Type and screen [place]     Status: None (Preliminary result)   Collection Time: 10/30/18  6:47 PM  Result Value Ref Range   ABO/RH(D) O POS    Antibody Screen NEG    Sample Expiration 11/02/2018,2359    Unit Number [id number]    Blood Component Type RED CELLS,LR    Unit division 00    Status of Unit ISSUED    Transfusion Status OK TO TRANSFUSE    Crossmatch Result      Compatible Performed at [facility], [address], [place], [place] [address]   Comprehensive metabolic panel     Status: Abnormal   Collection Time: 10/30/18  6:50 PM  Result Value Ref Range   Sodium 138 135 - 145 mmol/L   Potassium 4.4 3.5 - 5.1 mmol/L   Chloride 107 98 - 111 mmol/L   CO2 20 (L) 22 - 32 mmol/L   Glucose, Bld 110 (H) 70 - 99 mg/dL   BUN 33 (H) 8 - 23 mg/dL   Creatinine, Ser 1.61 (H) 0.44 - 1.00 mg/dL   Calcium 9.2 8.9 - 10.3 mg/dL   Total Protein 7.5 6.5 - 8.1 g/dL   Albumin 4.1 3.5 - 5.0 g/dL   AST 13 (L) 15 - 41 U/L   ALT 11 0 - 44 U/L   Alkaline Phosphatase 53 38 - 126 U/L   Total Bilirubin 0.6 0.3 - 1.2 mg/dL   GFR calc non Af Amer 30 (L) >60 mL/min   GFR calc Af Amer 34 (L) >60 mL/min   Anion gap 11 5 - 15    Comment: Performed at [facility] [address] [place], [place] [address]  CBC     Status: Abnormal   Collection Time: 10/30/18  6:50 PM  Result Value Ref Range   WBC 6.1 4.0 - 10.5 K/uL   RBC 2.87  (L) 3.87 - 5.11 MIL/uL   Hemoglobin 6.2 (LL) 12.0 - 15.0 g/dL    Comment: REPEATED TO VERIFY Reticulocyte Hemoglobin testing may be clinically indicated, consider ordering this additional test [id number] THIS CRITICAL RESULT HAS VERIFIED AND  BEEN CALLED TO G NIKLICH,RN BY WALTER BOND ON 08 27 2020 AT 1916, AND HAS BEEN READ BACK.     HCT 22.4 (L) 36.0 - 46.0 %   MCV 78.0 (L) 80.0 - 100.0 fL   MCH 21.6 (L) 26.0 - 34.0 pg   MCHC 27.7 (L) 30.0 - 36.0 g/dL   RDW 17.2 (H) 11.5 - 15.5 %   Platelets 258 150 - 400 K/uL   nRBC 0.0 0.0 - 0.2 %    Comment: Performed at [facility] [address], [place], [place] [address]  POC occult blood, ED     Status: None   Collection Time: 10/30/18  8:43 PM  Result Value Ref Range   Fecal Occult Bld NEGATIVE NEGATIVE  Prepare RBC     Status: None   Collection Time: 10/30/18  9:06 PM  Result Value Ref Range   Order Confirmation      ORDER PROCESSED BY BLOOD BANK Performed at [facility], [place] [address], [place], [place] [address]    No results found.  Pending Labs Unresulted Labs (From admission, onward)    Start     Ordered   10/30/18 2159  SARS CORONAVIRUS 2 (TAT 6-12 HRS) Nasal Swab Aptima Multi Swab  (Asymptomatic/Tier 2)  Once,   STAT    Question Answer Comment  Is this test for diagnosis or screening Screening   Symptomatic for COVID-19 as defined by CDC No   Hospitalized for COVID-19 No   Admitted to ICU for COVID-19 No   Previously tested for COVID-19 No   Resident in a congregate (group) care setting No   Employed in healthcare setting No   Pregnant No      10/30/18 2158   Signed and Held  Magnesium  Once,   R     Signed and Held   Signed and Held  Phosphorus  Once,   R     Signed and Held   Signed and Held  Protime-INR  Once,   R     Signed and Held   Signed and Held  Hemoglobin  Now then every 8 hours,   R     Signed and Held   Signed and Held  Hematocrit  Now then every 8 hours,   R     Signed and Held    Visual merchandiser and Held  Urinalysis, Complete w Microscopic  Once,   R     Signed and Held   Signed and Held  Occult blood card to lab, stool  Daily,   R     Signed and Held   Visual merchandiser and Occupational hygienist morning,   R     Signed and Held   Signed and Held  CBC  Tomorrow morning,   R     Signed and Held   Signed and Held  Prepare RBC  (Adult Blood Administration - Red Blood Cells)  Once,   R    Comments: Total of 2 units of PRBC, ER ordered 1 unit of PRBC already   Question Answer Comment  # of Units 1 unit   Transfusion Indications Symptomatic Anemia   If emergent release call blood bank Not emergent release      Signed and Held   Signed and Held  Iron and TIBC  Once,   R     Signed and Held   Signed and Held  Ferritin  Once,   R  Signed and Held   Signed and Held  Vitamin B12  Once,   R     Signed and Held   Signed and Held  Folate RBC  Once,   R     Signed and Held          Vitals/Pain Today's Vitals   10/30/18 2116 10/30/18 2117 10/30/18 2146 10/30/18 2200  BP:   (!) 102/91 137/73  Pulse:   80 78  Resp:   13 19  Temp: 98.3 F (36.8 C)  98.3 F (36.8 C) 98.3 F (36.8 C)  TempSrc: Oral  Oral Oral  SpO2:   100% 97%  PainSc: 0-No pain 0-No pain  0-No pain    Isolation Precautions No active isolations  Medications Medications  0.9 %  sodium chloride infusion (10 mL/hr Intravenous New Bag/Given 10/30/18 2205)    Mobility walks Low fall risk   Focused Assessments Cardiac Assessment Handoff:  Cardiac Rhythm: Normal sinus rhythm Lab Results  Component Value Date   TROPONINI 1.93 (HH) 11/26/2014   No results found for: DDIMER Does the Patient currently have chest pain? No     R Recommendations: See Admitting Provider Note  Report given to:   Additional Notes:

## 2018-10-30 NOTE — ED Triage Notes (Signed)
Patient sent by PCP for hemoglobin 6.1 - drawn yesterday. Endorsing generalized weakness. Patient has hx ischemic colitis and endorses abdominal pain, N/V/D that occurred and resolved about a week ago, denies any since. No recent dark or bloody stools. Denies shortness of breath.

## 2018-10-30 NOTE — H&P (Signed)
History and Physical  Wanda Green [id number] DOB: 02-Jul-1934 DOA: 10/30/2018  Referring physician: ER provider PCP: Charolette Forward, MD  Outpatient Specialists:    Patient coming from: Home  Chief Complaint: Low hemoglobin (6.1 g/dL)  HPI:  Patient is an 84 year old female past medical history significant for OSA on CPAP, hypertension, MI, hyperlipidemia, dysrhythmias, ischemic colitis involving the territory of the inferior mesenteric artery as per colonoscopy done in 2016, right breast cancer and iron deficiency anemia.  Patient went to see the primary care provider's office yesterday for routine yearly blood check and was found to have hemoglobin of 6.1 g/dL.  Patient was asked to come to the hospital for further assessment and management.  On further questioning, patient endorsed feeling weak and tired for over 1 month.  No associated shortness of breath at rest, but seems to get tired with minimal exertion.  No chest pain.  No headache, no neck pain, no GI symptoms and no urinary symptoms.  Patient uses NSAIDs very occasionally.  Patient used NSAIDs today, but the last use prior to today was about a month ago.  On presentation to the hospital, patient was found to have hemoglobin of 6.2 g/dL (patient's baseline hemoglobin is 10.2 g/dL).  Hospitalist team will admit patient for further assessment and management.  ED Course: On presentation to the hospital, patient was afebrile, heart rate of 72 bpm, respiratory rate of 18 and blood pressure 102/91 mmHg and O2 sat of 100%.  Pertinent labs reveal hemoglobin of 6.2 g/dL (baseline hemoglobin of 10.2 g/dL), hematocrit of 22.4, CO2 20, BUN of 33 with creatinine of 1.6126 patient's baseline creatinine is 1.31).  First stool occult blood is negative.  Pertinent labs: As documented above.  Review of Systems:  Negative for fever, visual changes, sore throat, rash, new muscle aches, chest pain, dysuria, bleeding, n/v/abdominal pain.  Past Medical  History:  Diagnosis Date  . Anemia   . Arthritis    "knees" (04/09/2017)  . Breast cancer, right breast ([place]) 2000  . Cervical dysplasia age 39  . Claustrophobia    "SEVERE"  . Colitis, ischemic ([place])   . Complication of anesthesia   . Dysrhythmia    hx of a-fib (when her colitis flared up 2016)  back in rhythym  . Elevated cholesterol   . GERD (gastroesophageal reflux disease)    takes prilosec as needed  . Heart attack ([place]) 2016   "mild one";  Dr Terrence Dupont  . Heart murmur   . Hypertension   . OSA on CPAP    tested  2014  . Osteoporosis 06/2016   T score -3.0  . PONV (postoperative nausea and vomiting)     Past Surgical History:  Procedure Laterality Date  . BREAST BIOPSY Right 2000  . BREAST LUMPECTOMY Right 2000  . CARDIAC CATHETERIZATION N/A 11/25/2014   Procedure: Left Heart Cath and Coronary Angiography;  Surgeon: Dixie Dials, MD;  Location: [facility];  Service: Cardiovascular;  Laterality: N/A;  . CATARACT EXTRACTION W/ INTRAOCULAR LENS  IMPLANT, BILATERAL Bilateral 2018  . CATARACT EXTRACTION, BILATERAL    . CERVICAL CONE BIOPSY  age 66  . COLONOSCOPY WITH PROPOFOL N/A 09/01/2014   Procedure: COLONOSCOPY WITH PROPOFOL;  Surgeon: Ronald Lobo, MD;  Location: [facility];  Service: Endoscopy;  Laterality: N/A;  this will be done unprepped  . COLPOSCOPY    . FRACTURE SURGERY    . JOINT REPLACEMENT    . KNEE ARTHROSCOPY Bilateral   . NASAL SEPTUM SURGERY  12/14  . TOTAL KNEE ARTHROPLASTY Left 04/09/2017  . TOTAL KNEE ARTHROPLASTY Left 04/09/2017   Procedure: LEFT TOTAL KNEE ARTHROPLASTY;  Surgeon: [name], MD;  Location: [facility];  Service: Orthopedics;  Laterality: Left;  . TUBAL LIGATION    . WRIST FRACTURE SURGERY Left ~ 2006     reports that she quit smoking about 46 years ago. Her smoking use included cigarettes. She has a 0.30 pack-year smoking history. She has never used smokeless tobacco. She reports that she does not drink alcohol or use  drugs.  Allergies  Allergen Reactions  . Penicillins Nausea And Vomiting and Other (See Comments)    Caused bleeding also PATIENT HAS HAD A PCN REACTION WITH IMMEDIATE RASH, FACIAL/TONGUE/THROAT SWELLING, SOB, OR LIGHTHEADEDNESS WITH HYPOTENSION:  #  #  #  YES  #  #  #   Has patient had a PCN reaction causing severe rash involving mucus membranes or skin necrosis: No Has patient had a PCN reaction that required hospitalization No Has patient had a PCN reaction occurring within the last 10 years: #  #  #  YES  #  #  # .   Marland Kitchen Atorvastatin Other (See Comments)    Muscle and leg myalgias  . Augmentin [Amoxicillin-Pot Clavulanate] Nausea And Vomiting and Other (See Comments)    Mouth pain  . Codeine Nausea And Vomiting  . Doxycycline Nausea And Vomiting  . Hydrocodone Nausea And Vomiting  . Oxycodone-Acetaminophen Nausea And Vomiting    Family History  Problem Relation Age of Onset  . Hypertension Mother   . Heart failure Mother   . Heart disease Mother   . Hypertension Father   . Heart disease Father   . Hypertension Sister   . Uterine cancer Sister   . Hypertension Brother   . Heart disease Brother      Prior to Admission medications   Medication Sig Start Date End Date Taking? Authorizing Provider  amLODipine (NORVASC) 2.5 MG tablet Take 2.5 mg by mouth at bedtime. 11/15/14  Yes [provider]  aspirin 81 MG EC tablet Take 1 tablet (81 mg total) by mouth daily. After you have completed 5 day Rx of Apixiban (Eliquis) Patient taking differently: Take 81 mg by mouth daily.  04/09/17  Yes Prudencio Burly III, PA-C  atorvastatin (LIPITOR) 20 MG tablet Take 10 mg by mouth every Monday, Wednesday, and Friday.    Yes [provider]  losartan (COZAAR) 100 MG tablet Take 100 mg by mouth daily.   Yes [provider]  metoprolol succinate (TOPROL-XL) 25 MG 24 hr tablet Take 25-50 mg by mouth See admin instructions. Take 25mg  in the morning and 50mg  at  night.   Yes [provider]  omeprazole (PRILOSEC) 40 MG capsule Take 1 capsule (40 mg total) by mouth daily as needed. Patient taking differently: Take 40 mg by mouth daily as needed (for heartburn).  07/14/12  Yes Leandrew Koyanagi, MD  oxybutynin (DITROPAN) 5 MG tablet TAKE 1 TABLET BY MOUTH THREE TIMES A DAY Patient taking differently: Take 5 mg by mouth 3 (three) times daily.  07/10/18  Yes Anastasio Auerbach, MD    Physical Exam: Vitals:   10/30/18 2032 10/30/18 2045 10/30/18 2100 10/30/18 2116  BP: (!) 142/106 (!) 179/140 (!) 102/91   Pulse: 79 68 72   Resp: 18 14 18    Temp:    98.3 F (36.8 C)  TempSrc:    Oral  SpO2: 100% Marland Kitchen)  86% 100%    Constitutional:  . Appears calm and comfortable Eyes:  . Pallor. No jaundice.  ENMT:  . external ears, nose appear normal Neck:  . Neck is supple. No JVD Respiratory:  . CTA bilaterally, no w/r/r.  . Respiratory effort normal. No retractions or accessory muscle use Cardiovascular:  . S1S2 with query ejection systolic murmur. . No LE extremity edema   Abdomen:  . Abdomen is soft and non tender. Organs are difficult to assess. Neurologic:  . Awake and alert. . Moves all limbs.  Wt Readings from Last 3 Encounters:  04/28/18 77.7 kg  12/26/17 75.8 kg  08/23/17 73.9 kg    I have personally reviewed following labs and imaging studies  Labs on Admission:  CBC: Recent Labs  Lab 10/30/18 1850  WBC 6.1  HGB 6.2*  HCT 22.4*  MCV 78.0*  PLT 0000000   Basic Metabolic Panel: Recent Labs  Lab 10/30/18 1850  NA 138  K 4.4  CL 107  CO2 20*  GLUCOSE 110*  BUN 33*  CREATININE 1.61*  CALCIUM 9.2   Liver Function Tests: Recent Labs  Lab 10/30/18 1850  AST 13*  ALT 11  ALKPHOS 53  BILITOT 0.6  PROT 7.5  ALBUMIN 4.1   No results for input(s): LIPASE, AMYLASE in the last 168 hours. No results for input(s): AMMONIA in the last 168 hours. Coagulation Profile: No results for input(s): INR, PROTIME in the last  168 hours. Cardiac Enzymes: No results for input(s): CKTOTAL, CKMB, CKMBINDEX, TROPONINI in the last 168 hours. BNP (last 3 results) No results for input(s): PROBNP in the last 8760 hours. HbA1C: No results for input(s): HGBA1C in the last 72 hours. CBG: No results for input(s): GLUCAP in the last 168 hours. Lipid Profile: No results for input(s): CHOL, HDL, LDLCALC, TRIG, CHOLHDL, LDLDIRECT in the last 72 hours. Thyroid Function Tests: No results for input(s): TSH, T4TOTAL, FREET4, T3FREE, THYROIDAB in the last 72 hours. Anemia Panel: No results for input(s): VITAMINB12, FOLATE, FERRITIN, TIBC, IRON, RETICCTPCT in the last 72 hours. Urine analysis:    Component Value Date/Time   COLORURINE YELLOW 04/19/2017 2244   APPEARANCEUR CLEAR 04/19/2017 2244   LABSPEC 1.017 04/19/2017 2244   PHURINE 5.0 04/19/2017 2244   GLUCOSEU NEGATIVE 04/19/2017 2244   HGBUR NEGATIVE 04/19/2017 2244   BILIRUBINUR negative 08/02/2017 1158   KETONESUR negative 08/02/2017 1158   KETONESUR 5 (A) 04/19/2017 2244   PROTEINUR negative 08/02/2017 1158   PROTEINUR NEGATIVE 04/19/2017 2244   UROBILINOGEN 0.2 08/02/2017 1158   UROBILINOGEN 1.0 11/30/2014 0448   NITRITE Positive (A) 08/02/2017 1158   NITRITE NEGATIVE 04/19/2017 2244   LEUKOCYTESUR Large (3+) (A) 08/02/2017 1158   Sepsis Labs: @LABRCNTIP (procalcitonin:4,lacticidven:4) )No results found for this or any previous visit (from the past 240 hour(s)).    Radiological Exams on Admission: No results found.   Active Problems:   * No active hospital problems. *   Assessment/Plan Symptomatic anemia: Admit patient for further assessment and management Patient has history of iron deficiency anemia and ischemic colitis. Stool occult blood daily Iron studies Consider IV iron Monitor H/H Transfused 2 units of packed red blood cells Consult GI team in the morning (patient is known to Dr. Kennith Gain) Further management depend on hospital  course.  Iron deficiency anemia: See above.  Systolic murmur: Patient sees a cardiologist Defer to the cardiology team.  History of ischemic colitis: No associated abdominal/preprandial pain reported Stable. Consider checking lactic acid level if abdominal  pain recurs.  Hypertension: Controlled. Continue current management.  History of coronary artery disease: Stable for now. Continue to monitor closely.  DVT prophylaxis: SCD Code Status: Full code Family Communication:  Disposition Plan: Home eventually Consults called: Please consult GI team in the morning Admission status: Observation  Time spent: 65 minutes  Dana Allan, MD  [facility] Pager #: [phone] 7PM-7AM contact night coverage as above  10/30/2018, 9:32 PM

## 2018-10-31 ENCOUNTER — Encounter (HOSPITAL_COMMUNITY): Payer: Self-pay

## 2018-10-31 DIAGNOSIS — D649 Anemia, unspecified: Secondary | ICD-10-CM | POA: Diagnosis not present

## 2018-10-31 LAB — URINALYSIS, COMPLETE (UACMP) WITH MICROSCOPIC
Bacteria, UA: NONE SEEN
Bilirubin Urine: NEGATIVE
Glucose, UA: NEGATIVE mg/dL
Hgb urine dipstick: NEGATIVE
Ketones, ur: NEGATIVE mg/dL
Leukocytes,Ua: NEGATIVE
Nitrite: NEGATIVE
Protein, ur: NEGATIVE mg/dL
Specific Gravity, Urine: 1.006 (ref 1.005–1.030)
pH: 5 (ref 5.0–8.0)

## 2018-10-31 LAB — BASIC METABOLIC PANEL
Anion gap: 9 (ref 5–15)
BUN: 24 mg/dL — ABNORMAL HIGH (ref 8–23)
CO2: 22 mmol/L (ref 22–32)
Calcium: 9.4 mg/dL (ref 8.9–10.3)
Chloride: 109 mmol/L (ref 98–111)
Creatinine, Ser: 1.18 mg/dL — ABNORMAL HIGH (ref 0.44–1.00)
GFR calc Af Amer: 50 mL/min — ABNORMAL LOW (ref >60)
GFR calc non Af Amer: 43 mL/min — ABNORMAL LOW (ref >60)
Glucose, Bld: 98 mg/dL (ref 70–99)
Potassium: 4.1 mmol/L (ref 3.5–5.1)
Sodium: 140 mmol/L (ref 135–145)

## 2018-10-31 LAB — CBC
HCT: 29.2 % — ABNORMAL LOW (ref 36.0–46.0)
Hemoglobin: 8.8 g/dL — ABNORMAL LOW (ref 12.0–15.0)
MCH: 23.4 pg — ABNORMAL LOW (ref 26.0–34.0)
MCHC: 30.1 g/dL (ref 30.0–36.0)
MCV: 77.7 fL — ABNORMAL LOW (ref 80.0–100.0)
Platelets: 215 10*3/uL (ref 150–400)
RBC: 3.76 MIL/uL — ABNORMAL LOW (ref 3.87–5.11)
RDW: 17.9 % — ABNORMAL HIGH (ref 11.5–15.5)
WBC: 6.3 10*3/uL (ref 4.0–10.5)
nRBC: 0 % (ref 0.0–0.2)

## 2018-10-31 LAB — PROTIME-INR
INR: 1.1 (ref 0.8–1.2)
Prothrombin Time: 14.5 seconds (ref 11.4–15.2)

## 2018-10-31 LAB — MAGNESIUM: Magnesium: 2.1 mg/dL (ref 1.7–2.4)

## 2018-10-31 LAB — IRON AND TIBC
Iron: 37 ug/dL (ref 28–170)
Saturation Ratios: 9 % — ABNORMAL LOW (ref 10.4–31.8)
TIBC: 405 ug/dL (ref 250–450)
UIBC: 368 ug/dL

## 2018-10-31 LAB — SARS CORONAVIRUS 2 (TAT 6-24 HRS): SARS Coronavirus 2: NEGATIVE

## 2018-10-31 LAB — PHOSPHORUS: Phosphorus: 3.4 mg/dL (ref 2.5–4.6)

## 2018-10-31 LAB — VITAMIN B12: Vitamin B-12: 360 pg/mL (ref 180–914)

## 2018-10-31 LAB — FERRITIN: Ferritin: 7 ng/mL — ABNORMAL LOW (ref 11–307)

## 2018-10-31 MED ORDER — FERROUS SULFATE 325 (65 FE) MG PO TABS: 325.0000 mg | ORAL_TABLET | Freq: Every day | ORAL | Status: DC

## 2018-10-31 MED ORDER — FERROUS SULFATE 325 (65 FE) MG PO TABS: 325.0000 mg | ORAL_TABLET | Freq: Every day | ORAL | 1 refills | Status: DC

## 2018-10-31 NOTE — Progress Notes (Signed)
2nd unit of RBC transfusion completed. Pt alert and oriented x4 with vital signs stable, no reaction noted. Will continue to monitor.

## 2018-10-31 NOTE — Progress Notes (Signed)
Patient discharged to home with instructions. 

## 2018-10-31 NOTE — Discharge Summary (Signed)
Physician Discharge Summary  Wanda Green [id number] DOB: 16-Oct-1934 DOA: 10/30/2018  PCP: Charolette Forward, MD  Admit date: 10/30/2018 Discharge date: 10/31/2018  Admitted From: home Discharge disposition: home   Recommendations for Outpatient Follow-Up:   1. Suspect a slow decline in her Hgb-- she had stopped her Fe so will resume this and she will follow up with [facility] To discuss outpatient EGD/colonscopy 2. H/h in 1 week 3. Avoid NSAIDS   Discharge Diagnosis:   Active Problems:   Symptomatic anemia    Discharge Condition: Improved.  Diet recommendation: Low sodium, heart healthy.  Wound care: None.  Code status: Full.   History of Present Illness:   HPI:  Patient is an 84 year old female past medical history significant for OSA on CPAP, hypertension, MI, hyperlipidemia, dysrhythmias, ischemic colitis involving the territory of the inferior mesenteric artery as per colonoscopy done in 2016, right breast cancer and iron deficiency anemia.  Patient went to see the primary care provider's office yesterday for routine yearly blood check and was found to have hemoglobin of 6.1 g/dL.  Patient was asked to come to the hospital for further assessment and management.   Hospital Course by Problem:   Symptomatic anemia: Patient has history of iron deficiency anemia and ischemic colitis. Stool occult blood negative and patient denies BRBPR and dark stools Iron studies show a declining Fe level (patient stopped her supplementation-- will resume Transfused 2 units of packed red blood cells with good response and resolution of her symptoms Discussed either EGD/colonscopy here in hospital vs outpatient and she would prefer to have done outpatient-- follow up arranged with [facility]  Systolic murmur: known per patient Patient sees a cardiologist Defer to the cardiology team.  History of ischemic colitis: No associated abdominal/preprandial pain reported   Hypertension: Controlled. Continue current management.  History of coronary artery disease: Stable    Medical Consultants:    GI (phone)  Discharge Exam:   Vitals:   10/31/18 0407 10/31/18 0900  BP: 122/63 111/81  Pulse: 62 (!) 58  Resp: 18 15  Temp: 98.2 F (36.8 C)   SpO2: 94% 96%   Vitals:   10/31/18 0150 10/31/18 0154 10/31/18 0407 10/31/18 0900  BP: 127/61 127/61 122/63 111/81  Pulse: 78 78 62 (!) 58  Resp: 18 18 18 15   Temp: 98.4 F (36.9 C) 98.4 F (36.9 C) 98.2 F (36.8 C)   TempSrc:  Oral Oral   SpO2:  98% 94% 96%  Weight:      Height:        General exam: Appears calm and comfortable. Feels great  The results of significant diagnostics from this hospitalization (including imaging, microbiology, ancillary and laboratory) are listed below for reference.     Procedures and Diagnostic Studies:   No results found.   Labs:   Basic Metabolic Panel: Recent Labs  Lab 10/30/18 1850 10/31/18 0809  NA 138 140  K 4.4 4.1  CL 107 109  CO2 20* 22  GLUCOSE 110* 98  BUN 33* 24*  CREATININE 1.61* 1.18*  CALCIUM 9.2 9.4  MG  --  2.1  PHOS  --  3.4   GFR Estimated Creatinine Clearance: 37.3 mL/min (A) (by C-G formula based on SCr of 1.18 mg/dL (H)). Liver Function Tests: Recent Labs  Lab 10/30/18 1850  AST 13*  ALT 11  ALKPHOS 53  BILITOT 0.6  PROT 7.5  ALBUMIN 4.1   No results for input(s): LIPASE, AMYLASE in the  last 168 hours. No results for input(s): AMMONIA in the last 168 hours. Coagulation profile Recent Labs  Lab 10/31/18 0809  INR 1.1    CBC: Recent Labs  Lab 10/30/18 1850 10/31/18 0809  WBC 6.1 6.3  HGB 6.2* 8.8*  HCT 22.4* 29.2*  MCV 78.0* 77.7*  PLT 258 215   Cardiac Enzymes: No results for input(s): CKTOTAL, CKMB, CKMBINDEX, TROPONINI in the last 168 hours. BNP: Invalid input(s): POCBNP CBG: No results for input(s): GLUCAP in the last 168 hours. D-Dimer No results for input(s): DDIMER in the last 72  hours. Hgb A1c No results for input(s): HGBA1C in the last 72 hours. Lipid Profile No results for input(s): CHOL, HDL, LDLCALC, TRIG, CHOLHDL, LDLDIRECT in the last 72 hours. Thyroid function studies No results for input(s): TSH, T4TOTAL, T3FREE, THYROIDAB in the last 72 hours.  Invalid input(s): FREET3 Anemia work up Recent Labs    10/31/18 0809  VITAMINB12 360  FERRITIN 7*  TIBC 405  IRON 37   Microbiology Recent Results (from the past 240 hour(s))  SARS CORONAVIRUS 2 (TAT 6-12 HRS) Nasal Swab Aptima Multi Swab     Status: None   Collection Time: 10/30/18  9:59 PM   Specimen: Aptima Multi Swab; Nasal Swab  Result Value Ref Range Status   SARS Coronavirus 2 NEGATIVE NEGATIVE Final    Comment: (NOTE) SARS-CoV-2 target nucleic acids are NOT DETECTED. The SARS-CoV-2 RNA is generally detectable in upper and lower respiratory specimens during the acute phase of infection. Negative results do not preclude SARS-CoV-2 infection, do not rule out co-infections with other pathogens, and should not be used as the sole basis for treatment or other patient management decisions. Negative results must be combined with clinical observations, patient history, and epidemiological information. The expected result is Negative. Fact Sheet for Patients: SugarRoll.be Fact Sheet for Healthcare Providers: https://www.woods-mathews.com/ This test is not yet approved or cleared by the [place] FDA and  has been authorized for detection and/or diagnosis of SARS-CoV-2 by FDA under an Emergency Use Authorization (EUA). This EUA will remain  in effect (meaning this test can be used) for the duration of the COVID-19 declaration under Section 56 4(b)(1) of the Act, 21 U.S.C. section 360bbb-3(b)(1), unless the authorization is terminated or revoked sooner. Performed at [facility], [place] [address], [place], [place] [address]      Discharge  Instructions:   Discharge Instructions    Diet - low sodium heart healthy   Complete by: As directed    Discharge instructions   Complete by: As directed    Outpatient follow up with GI- EGD/colonoscopy Return to ER with worsening symptoms or bleeding   Increase activity slowly   Complete by: As directed      Allergies as of 10/31/2018      Reactions   Penicillins Nausea And Vomiting, Other (See Comments)   Caused bleeding also PATIENT HAS HAD A PCN REACTION WITH IMMEDIATE RASH, FACIAL/TONGUE/THROAT SWELLING, SOB, OR LIGHTHEADEDNESS WITH HYPOTENSION:  #  #  #  YES  #  #  #   Has patient had a PCN reaction causing severe rash involving mucus membranes or skin necrosis: No Has patient had a PCN reaction that required hospitalization No Has patient had a PCN reaction occurring within the last 10 years: #  #  #  YES  #  #  # .   Atorvastatin Other (See Comments)   Muscle and leg myalgias   Augmentin [amoxicillin-pot Clavulanate] Nausea  And Vomiting, Other (See Comments)   Mouth pain   Codeine Nausea And Vomiting   Doxycycline Nausea And Vomiting   Hydrocodone Nausea And Vomiting   Oxycodone-acetaminophen Nausea And Vomiting      Medication List    TAKE these medications   amLODipine 2.5 MG tablet Commonly known as: NORVASC Take 2.5 mg by mouth at bedtime.   aspirin 81 MG EC tablet Take 1 tablet (81 mg total) by mouth daily. After you have completed 5 day Rx of Apixiban (Eliquis) What changed: additional instructions   atorvastatin 20 MG tablet Commonly known as: LIPITOR Take 10 mg by mouth every Monday, Wednesday, and Friday.   ferrous sulfate 325 (65 FE) MG tablet Take 1 tablet (325 mg total) by mouth daily with breakfast. Start taking on: November 01, 2018   losartan 100 MG tablet Commonly known as: COZAAR Take 100 mg by mouth daily.   metoprolol succinate 25 MG 24 hr tablet Commonly known as: TOPROL-XL Take 25-50 mg by mouth See admin instructions. Take 25mg  in the  morning and 50mg  at night.   omeprazole 40 MG capsule Commonly known as: PRILOSEC Take 1 capsule (40 mg total) by mouth daily as needed. What changed: reasons to take this   oxybutynin 5 MG tablet Commonly known as: [place] 1 TABLET BY MOUTH THREE TIMES A DAY      Follow-up Information    Charolette Forward, MD Follow up in 1 week(s).   Specialty: Cardiology Why: h/h Contact information: [address] [place] [address] [phone]        Ronald Lobo, MD Follow up.   Specialty: Gastroenterology Why: office will be in contact regarding appointment-- possibly Monday at 1:30 (tele/phone visit) Contact information: [address] [place] [address] [phone]            Time coordinating discharge: 25 min  Signed:  Geradine Girt DO  [facility] 10/31/2018, 2:47 PM

## 2018-10-31 NOTE — Progress Notes (Signed)
Received pt from ED, alert oriented x4, skin intact with blood transfusion on going. Vital signs stable, no reaction noted. Oriented pt to room, bed controls and plan of care. Left lying comfortably in bed with call bell at reach. Will continue to monitor.

## 2018-11-01 LAB — BPAM RBC
Blood Product Expiration Date: 202010032359
Blood Product Expiration Date: 202010032359
ISSUE DATE / TIME: 202008272134
ISSUE DATE / TIME: 202008280114
Unit Type and Rh: 5100
Unit Type and Rh: 5100

## 2018-11-01 LAB — TYPE AND SCREEN
ABO/RH(D): O POS
Antibody Screen: NEGATIVE
Unit division: 0
Unit division: 0

## 2018-11-03 DIAGNOSIS — D509 Iron deficiency anemia, unspecified: Secondary | ICD-10-CM | POA: Diagnosis not present

## 2018-11-03 DIAGNOSIS — R634 Abnormal weight loss: Secondary | ICD-10-CM | POA: Diagnosis not present

## 2018-11-03 DIAGNOSIS — R1310 Dysphagia, unspecified: Secondary | ICD-10-CM | POA: Diagnosis not present

## 2018-11-03 LAB — FOLATE RBC
Folate, Hemolysate: 352 ng/mL
Folate, RBC: 1266 ng/mL (ref >498)
Hematocrit: 27.8 % — ABNORMAL LOW (ref 34.0–46.6)

## 2018-11-06 ENCOUNTER — Other Ambulatory Visit: Payer: Self-pay | Admitting: Physician Assistant

## 2018-11-06 DIAGNOSIS — R1310 Dysphagia, unspecified: Secondary | ICD-10-CM

## 2018-11-06 DIAGNOSIS — R1319 Other dysphagia: Secondary | ICD-10-CM

## 2018-11-11 ENCOUNTER — Ambulatory Visit
Admission: RE | Admit: 2018-11-11 | Discharge: 2018-11-11 | Disposition: A | Discharge status: Home / Self Care | Payer: PPO | Source: Ambulatory Visit | Attending: Physician Assistant | Admitting: Physician Assistant

## 2018-11-11 DIAGNOSIS — R1310 Dysphagia, unspecified: Secondary | ICD-10-CM

## 2018-11-11 DIAGNOSIS — K219 Gastro-esophageal reflux disease without esophagitis: Secondary | ICD-10-CM | POA: Diagnosis not present

## 2018-11-11 DIAGNOSIS — R1319 Other dysphagia: Secondary | ICD-10-CM

## 2018-11-11 DIAGNOSIS — K449 Diaphragmatic hernia without obstruction or gangrene: Secondary | ICD-10-CM | POA: Diagnosis not present

## 2018-11-11 DIAGNOSIS — K224 Dyskinesia of esophagus: Secondary | ICD-10-CM | POA: Diagnosis not present

## 2018-11-12 ENCOUNTER — Other Ambulatory Visit: Payer: PPO

## 2018-11-12 DIAGNOSIS — D509 Iron deficiency anemia, unspecified: Secondary | ICD-10-CM | POA: Diagnosis not present

## 2018-12-01 DIAGNOSIS — F419 Anxiety disorder, unspecified: Secondary | ICD-10-CM | POA: Diagnosis not present

## 2018-12-01 DIAGNOSIS — I358 Other nonrheumatic aortic valve disorders: Secondary | ICD-10-CM | POA: Diagnosis not present

## 2018-12-01 DIAGNOSIS — G4733 Obstructive sleep apnea (adult) (pediatric): Secondary | ICD-10-CM | POA: Diagnosis not present

## 2018-12-01 DIAGNOSIS — Z86010 Personal history of colonic polyps: Secondary | ICD-10-CM | POA: Diagnosis not present

## 2018-12-01 DIAGNOSIS — K5900 Constipation, unspecified: Secondary | ICD-10-CM | POA: Diagnosis not present

## 2018-12-01 DIAGNOSIS — I10 Essential (primary) hypertension: Secondary | ICD-10-CM | POA: Diagnosis not present

## 2018-12-01 DIAGNOSIS — R1310 Dysphagia, unspecified: Secondary | ICD-10-CM | POA: Diagnosis not present

## 2018-12-01 DIAGNOSIS — R933 Abnormal findings on diagnostic imaging of other parts of digestive tract: Secondary | ICD-10-CM | POA: Diagnosis not present

## 2018-12-01 DIAGNOSIS — R011 Cardiac murmur, unspecified: Secondary | ICD-10-CM | POA: Diagnosis not present

## 2018-12-01 DIAGNOSIS — I38 Endocarditis, valve unspecified: Secondary | ICD-10-CM | POA: Diagnosis not present

## 2018-12-01 DIAGNOSIS — D509 Iron deficiency anemia, unspecified: Secondary | ICD-10-CM | POA: Diagnosis not present

## 2018-12-01 DIAGNOSIS — K219 Gastro-esophageal reflux disease without esophagitis: Secondary | ICD-10-CM | POA: Diagnosis not present

## 2018-12-01 DIAGNOSIS — I2510 Atherosclerotic heart disease of native coronary artery without angina pectoris: Secondary | ICD-10-CM | POA: Diagnosis not present

## 2018-12-01 DIAGNOSIS — M1990 Unspecified osteoarthritis, unspecified site: Secondary | ICD-10-CM | POA: Diagnosis not present

## 2018-12-01 DIAGNOSIS — E785 Hyperlipidemia, unspecified: Secondary | ICD-10-CM | POA: Diagnosis not present

## 2018-12-01 DIAGNOSIS — D649 Anemia, unspecified: Secondary | ICD-10-CM | POA: Diagnosis not present

## 2018-12-03 DIAGNOSIS — Z1159 Encounter for screening for other viral diseases: Secondary | ICD-10-CM | POA: Diagnosis not present

## 2018-12-04 DIAGNOSIS — D509 Iron deficiency anemia, unspecified: Secondary | ICD-10-CM | POA: Diagnosis not present

## 2018-12-04 DIAGNOSIS — R1310 Dysphagia, unspecified: Secondary | ICD-10-CM | POA: Diagnosis not present

## 2018-12-05 DIAGNOSIS — I358 Other nonrheumatic aortic valve disorders: Secondary | ICD-10-CM | POA: Diagnosis not present

## 2018-12-08 DIAGNOSIS — D509 Iron deficiency anemia, unspecified: Secondary | ICD-10-CM | POA: Diagnosis not present

## 2018-12-08 DIAGNOSIS — Z86010 Personal history of colonic polyps: Secondary | ICD-10-CM | POA: Diagnosis not present

## 2018-12-08 DIAGNOSIS — K317 Polyp of stomach and duodenum: Secondary | ICD-10-CM | POA: Diagnosis not present

## 2018-12-08 DIAGNOSIS — D122 Benign neoplasm of ascending colon: Secondary | ICD-10-CM | POA: Diagnosis not present

## 2018-12-11 ENCOUNTER — Encounter: Payer: Self-pay | Admitting: Gynecology

## 2018-12-11 DIAGNOSIS — D122 Benign neoplasm of ascending colon: Secondary | ICD-10-CM | POA: Diagnosis not present

## 2018-12-11 DIAGNOSIS — K317 Polyp of stomach and duodenum: Secondary | ICD-10-CM | POA: Diagnosis not present

## 2018-12-22 ENCOUNTER — Encounter: Payer: Self-pay | Admitting: Gynecology

## 2018-12-22 DIAGNOSIS — Z1231 Encounter for screening mammogram for malignant neoplasm of breast: Secondary | ICD-10-CM | POA: Diagnosis not present

## 2018-12-22 DIAGNOSIS — Z853 Personal history of malignant neoplasm of breast: Secondary | ICD-10-CM | POA: Diagnosis not present

## 2018-12-24 DIAGNOSIS — I8312 Varicose veins of left lower extremity with inflammation: Secondary | ICD-10-CM | POA: Diagnosis not present

## 2019-01-06 ENCOUNTER — Encounter: Payer: Self-pay | Admitting: Pulmonary Disease

## 2019-01-06 ENCOUNTER — Ambulatory Visit: Payer: PPO | Admitting: Pulmonary Disease

## 2019-01-06 ENCOUNTER — Other Ambulatory Visit: Payer: Self-pay

## 2019-01-06 DIAGNOSIS — G4733 Obstructive sleep apnea (adult) (pediatric): Secondary | ICD-10-CM

## 2019-01-06 DIAGNOSIS — Z23 Encounter for immunization: Secondary | ICD-10-CM | POA: Diagnosis not present

## 2019-01-06 NOTE — Progress Notes (Signed)
   Subjective:    Patient ID: Wanda RHYNER, female    DOB: 10/19/1934, 84 y.o.   MRN: [id number]  HPI  84 yo  for FU of  obstructive sleep apnea PMH - coronary artery disease and hypertension  Chief Complaint  Patient presents with  . Follow-up    F/U OSA. DME [facility]. Reports no new concerns.     Annual follow-up.  Her son unfortunately passed away for acute pancreatitis in August and she is grieving.  She still works as an Glass blower/designer at [employer] CPAP is working well, denies any problems with nasal mask or pressure. Download was reviewed which showed a large leak, good control of events 11 cm and average compliance about 4 hours every night   Significant tests/ events reviewed 08/2012 HST AHI 7/h  03/2015 CPAP titration 11 cm  Review of Systems Patient denies significant dyspnea,cough, hemoptysis,  chest pain, palpitations, pedal edema, orthopnea, paroxysmal nocturnal dyspnea, lightheadedness, nausea, vomiting, abdominal or  leg pains      Objective:   Physical Exam   Gen. Pleasant, well-nourished, in no distress ENT - no thrush, no pallor/icterus,no post nasal drip Neck: No JVD, no thyromegaly, no carotid bruits Lungs: no use of accessory muscles, no dullness to percussion, clear without rales or rhonchi  Cardiovascular: Rhythm regular, heart sounds  normal, no murmurs or gallops, no peripheral edema Musculoskeletal: No deformities, no cyanosis or clubbing         Assessment & Plan:

## 2019-01-06 NOTE — Assessment & Plan Note (Addendum)
CPAP is working well at 11 cm Supplies will be renewed as needed.  Flu shot today  Weight loss encouraged, compliance with goal of at least 4-6 hrs every night is the expectation. Advised against medications with sedative side effects Cautioned against driving when sleepy - understanding that sleepiness will vary on a day to day basis  Does have insomnia related to grief - does not need meds at this time

## 2019-01-06 NOTE — Patient Instructions (Signed)
CPAP is working well at 11 cm Supplies will be renewed as needed.  Flu shot today

## 2019-01-21 ENCOUNTER — Encounter: Payer: PPO | Admitting: Gynecology

## 2019-02-04 DIAGNOSIS — R198 Other specified symptoms and signs involving the digestive system and abdomen: Secondary | ICD-10-CM | POA: Diagnosis not present

## 2019-02-04 DIAGNOSIS — M7981 Nontraumatic hematoma of soft tissue: Secondary | ICD-10-CM | POA: Diagnosis not present

## 2019-02-04 DIAGNOSIS — D509 Iron deficiency anemia, unspecified: Secondary | ICD-10-CM | POA: Diagnosis not present

## 2019-02-04 DIAGNOSIS — Z8719 Personal history of other diseases of the digestive system: Secondary | ICD-10-CM | POA: Diagnosis not present

## 2019-02-04 DIAGNOSIS — Z86010 Personal history of colonic polyps: Secondary | ICD-10-CM | POA: Diagnosis not present

## 2019-02-04 DIAGNOSIS — I8311 Varicose veins of right lower extremity with inflammation: Secondary | ICD-10-CM | POA: Diagnosis not present

## 2019-02-05 IMAGING — DX DG KNEE 1-2V PORT*L*
1 series · 2 of 2 positions shown · non-contrast
Comparison: None.

CLINICAL DATA: Osteoarthritis. Status post left total knee
replacement.

EXAM:
PORTABLE LEFT KNEE - 1-2 VIEW

[Series 1: knee · 0.14mm/px · 2 of 2 slices shown]
[im 1/2]
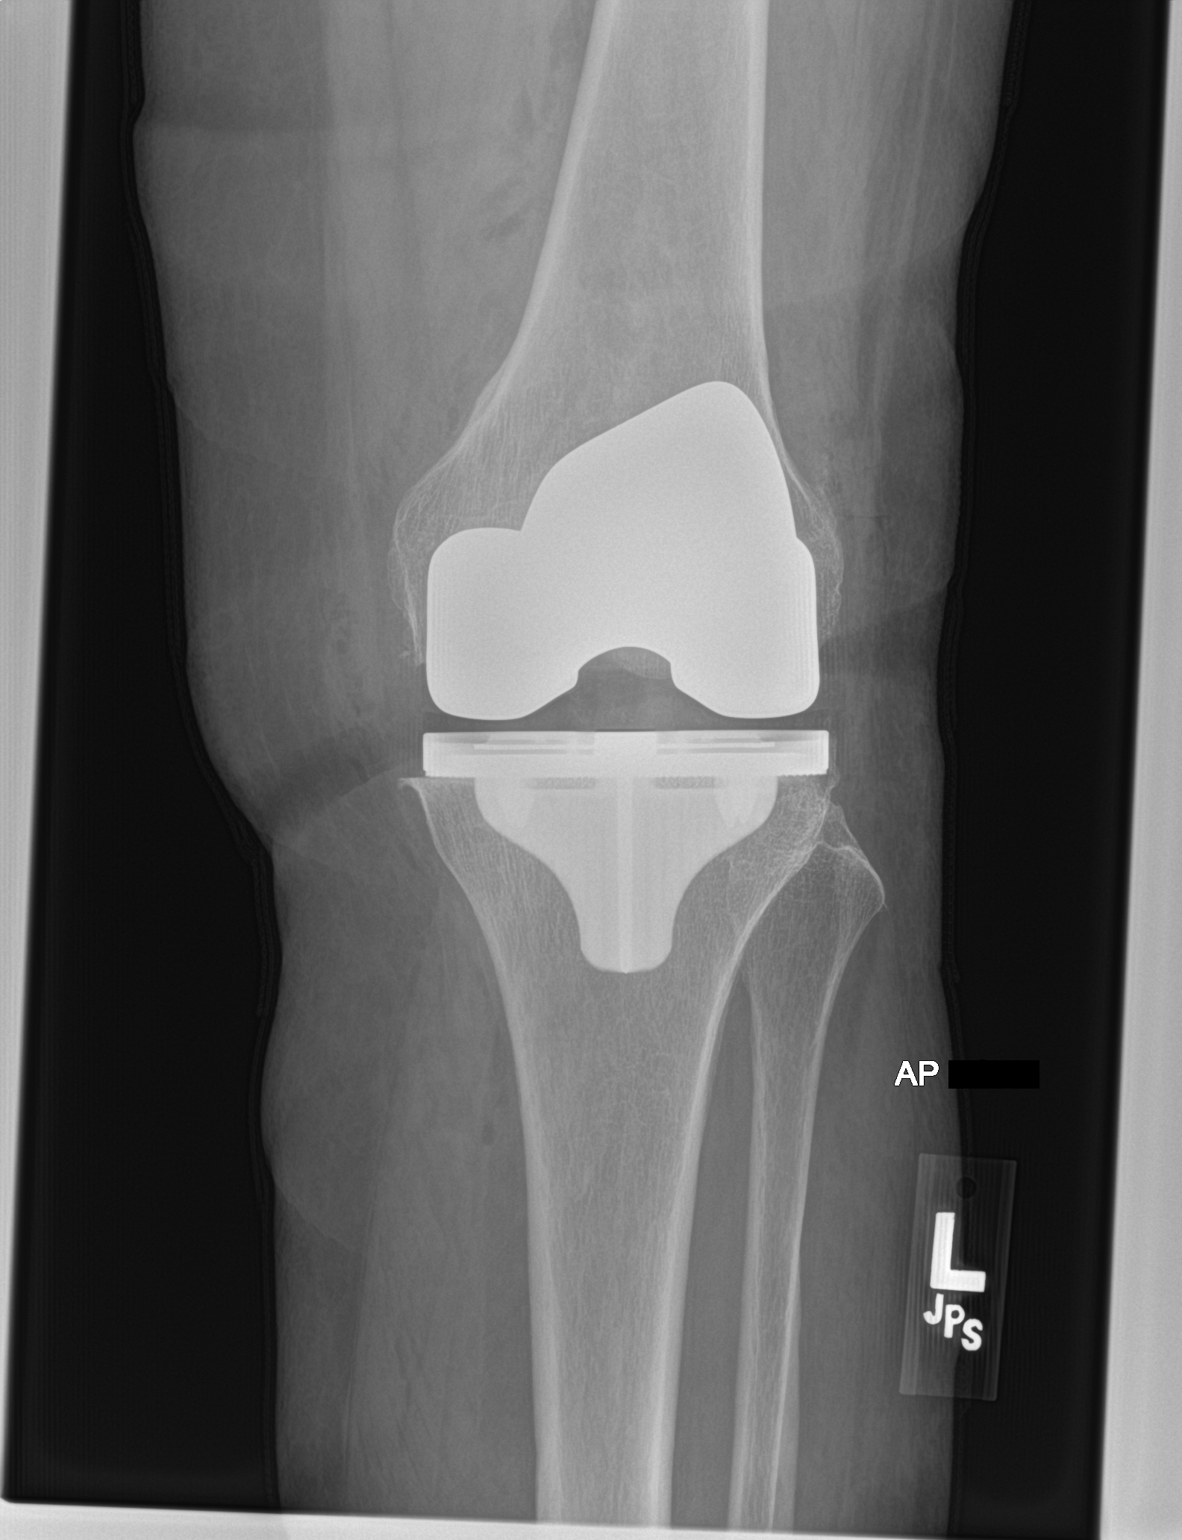
[im 2/2]
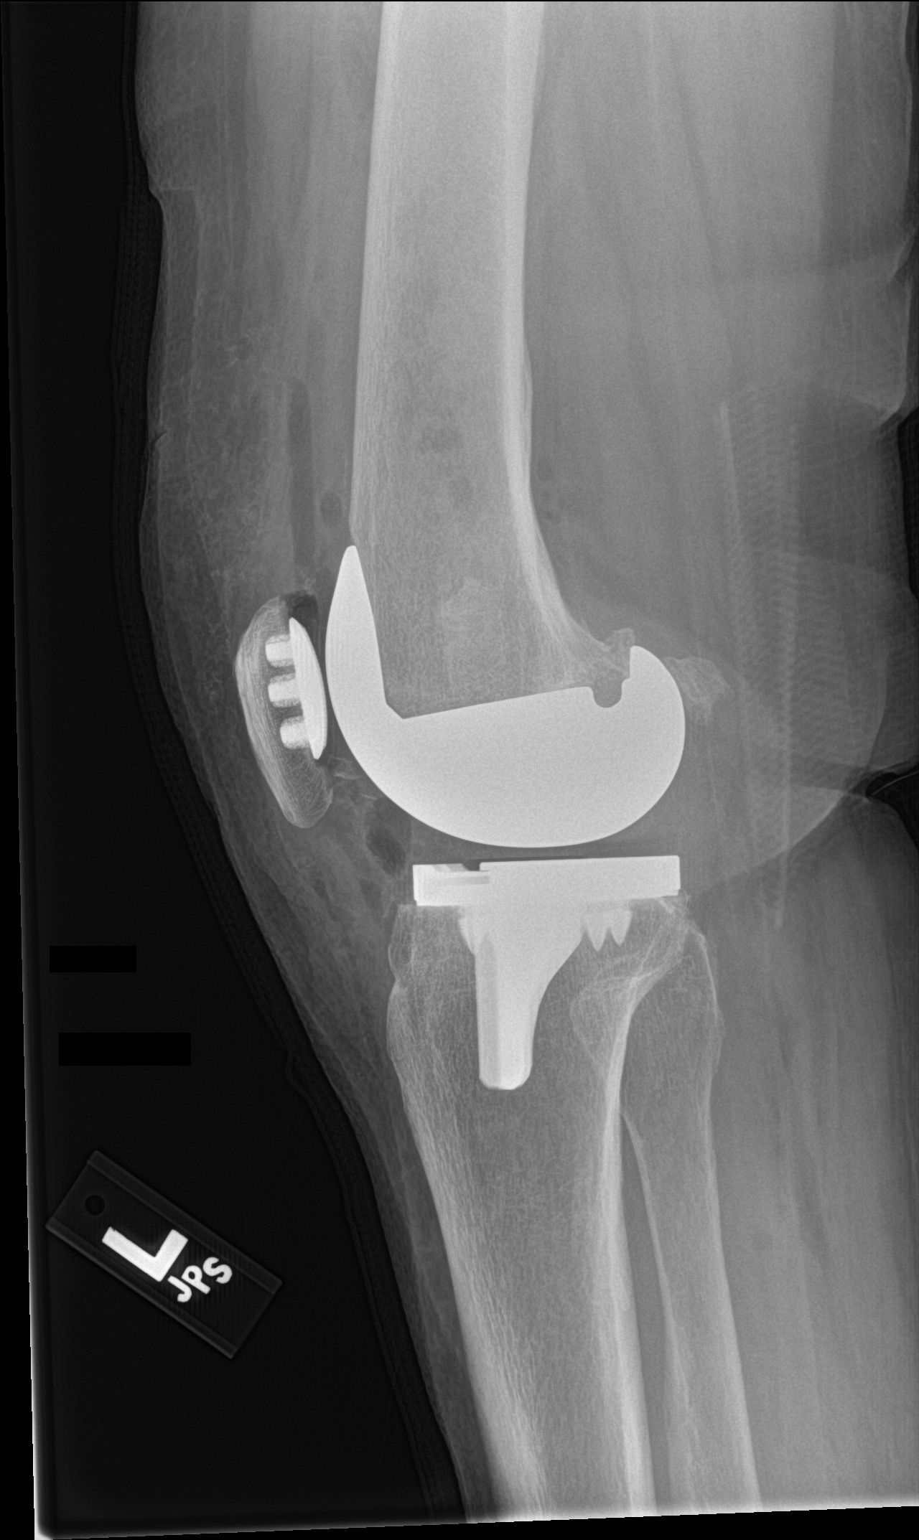

[2 of 2 positions shown; findings below may reference images not displayed]

FINDINGS: The components of the total knee prosthesis appear in excellent
position. No fractures. Postsurgical air to the expected degree.
IMPRESSION: Satisfactory appearance of the left knee after total knee
arthroplasty.

## 2019-02-09 ENCOUNTER — Encounter: Payer: Self-pay | Admitting: Gynecology

## 2019-02-09 ENCOUNTER — Other Ambulatory Visit: Payer: Self-pay

## 2019-02-09 ENCOUNTER — Ambulatory Visit (INDEPENDENT_AMBULATORY_CARE_PROVIDER_SITE_OTHER): Payer: PPO | Admitting: Gynecology

## 2019-02-09 VITALS — BP 122/82 | Ht 62.0 in | Wt 157.0 lb

## 2019-02-09 DIAGNOSIS — Z853 Personal history of malignant neoplasm of breast: Secondary | ICD-10-CM | POA: Diagnosis not present

## 2019-02-09 DIAGNOSIS — Z9289 Personal history of other medical treatment: Secondary | ICD-10-CM

## 2019-02-09 DIAGNOSIS — Z01419 Encounter for gynecological examination (general) (routine) without abnormal findings: Secondary | ICD-10-CM

## 2019-02-09 DIAGNOSIS — N952 Postmenopausal atrophic vaginitis: Secondary | ICD-10-CM

## 2019-02-09 DIAGNOSIS — M810 Age-related osteoporosis without current pathological fracture: Secondary | ICD-10-CM

## 2019-02-09 NOTE — Patient Instructions (Signed)
Follow-up for the bone density as scheduled. 

## 2019-02-09 NOTE — Progress Notes (Signed)
    [name] [birth year] [id number]        84 y.o.  [id number] for breast and pelvic exam.  Without gynecologic complaints  Past medical history,surgical history, problem list, medications, allergies, family history and social history were all reviewed and documented as reviewed in the EPIC chart.  ROS:  Performed with pertinent positives and negatives included in the history, assessment and plan.   Additional significant findings : None   Exam: Wanda Green assistant Vitals:   02/09/19 0952  BP: 122/82  Weight: 157 lb (71.2 kg)  Height: 5\' 2"  (1.575 m)   Body mass index is 28.72 kg/m.  General appearance:  Normal affect, orientation and appearance. Skin: Grossly normal HEENT: Without gross lesions.  No cervical or supraclavicular adenopathy. Thyroid normal.  Lungs:  Clear without wheezing, rales or rhonchi Cardiac: RR, without RMG Abdominal:  Soft, nontender, without masses, guarding, rebound, organomegaly or hernia Breasts:  Examined lying and sitting without masses, retractions, discharge or axillary adenopathy. Pelvic:  Ext, BUS, Vagina: With atrophic changes  Cervix: With atrophic changes  Uterus: Anteverted, normal size, shape and contour, midline and mobile nontender   Adnexa: Without masses or tenderness    Anus and perineum: Normal   Rectovaginal: Normal sphincter tone without palpated masses or tenderness.    Assessment/Plan:  84 y.o. [id number] female for breast and pelvic exam  1. Postmenopausal/atrophic genital changes.  No significant menopausal symptoms or any vaginal bleeding. 2. History of right breast cancer.  Mammography 12/2018.  Exam NED. 3. Colonoscopy 2020.  Repeat at their recommended interval. 4. Osteoporosis.  DEXA 2018 T score -3.  Restarted Fosamax but stopped due to GI upset.  Discussed alternatives to include Prolia.  Recommend follow-up DEXA now and then we discussed treatment options and she agrees to do so. 5. Pap smear 12/2016.  No Pap  smear done today.  History of cone biopsy at age 39 with normal Pap smears since.  Options to stop screening per current screening guidelines versus less frequent screening intervals discussed.  Will readdress on annual basis. 6. Health maintenance.  No routine lab work done as patient does this elsewhere.  Follow-up for DEXA.  Follow-up for annual exam in 1 year.   Wanda Auerbach MD, 10:28 AM 02/09/2019

## 2019-02-13 DIAGNOSIS — G4733 Obstructive sleep apnea (adult) (pediatric): Secondary | ICD-10-CM | POA: Diagnosis not present

## 2019-02-18 DIAGNOSIS — M7981 Nontraumatic hematoma of soft tissue: Secondary | ICD-10-CM | POA: Diagnosis not present

## 2019-02-18 DIAGNOSIS — I8312 Varicose veins of left lower extremity with inflammation: Secondary | ICD-10-CM | POA: Diagnosis not present

## 2019-03-04 DIAGNOSIS — M1990 Unspecified osteoarthritis, unspecified site: Secondary | ICD-10-CM | POA: Diagnosis not present

## 2019-03-04 DIAGNOSIS — I10 Essential (primary) hypertension: Secondary | ICD-10-CM | POA: Diagnosis not present

## 2019-03-04 DIAGNOSIS — F419 Anxiety disorder, unspecified: Secondary | ICD-10-CM | POA: Diagnosis not present

## 2019-03-04 DIAGNOSIS — E785 Hyperlipidemia, unspecified: Secondary | ICD-10-CM | POA: Diagnosis not present

## 2019-03-04 DIAGNOSIS — D649 Anemia, unspecified: Secondary | ICD-10-CM | POA: Diagnosis not present

## 2019-03-04 DIAGNOSIS — K219 Gastro-esophageal reflux disease without esophagitis: Secondary | ICD-10-CM | POA: Diagnosis not present

## 2019-03-04 DIAGNOSIS — G4733 Obstructive sleep apnea (adult) (pediatric): Secondary | ICD-10-CM | POA: Diagnosis not present

## 2019-03-11 DIAGNOSIS — M7981 Nontraumatic hematoma of soft tissue: Secondary | ICD-10-CM | POA: Diagnosis not present

## 2019-03-11 DIAGNOSIS — I8311 Varicose veins of right lower extremity with inflammation: Secondary | ICD-10-CM | POA: Diagnosis not present

## 2019-03-12 DIAGNOSIS — D509 Iron deficiency anemia, unspecified: Secondary | ICD-10-CM | POA: Diagnosis not present

## 2019-03-24 DIAGNOSIS — I8312 Varicose veins of left lower extremity with inflammation: Secondary | ICD-10-CM | POA: Diagnosis not present

## 2019-04-02 ENCOUNTER — Other Ambulatory Visit: Payer: Self-pay | Admitting: *Deleted

## 2019-04-02 MED ORDER — OXYBUTYNIN CHLORIDE 5 MG PO TABS: 5.0000 mg | ORAL_TABLET | Freq: Three times a day (TID) | ORAL | 1 refills | Status: DC

## 2019-04-02 NOTE — Telephone Encounter (Signed)
Yes, refill granted.

## 2019-04-02 NOTE — Telephone Encounter (Signed)
Former Dr.Fontaine patient) patient called requesting refill on oxybutynin 5 mg tablet 1 tablet three times daily. Had annual on 02/09/19 and forgot to mention a refill. Per office note on 12/26/17 "Detrusor instability.  Continues on oxybutynin".  Okay to refill medication?

## 2019-04-03 MED ORDER — OXYBUTYNIN CHLORIDE 5 MG PO TABS: 5.0000 mg | ORAL_TABLET | Freq: Three times a day (TID) | ORAL | 1 refills | Status: DC

## 2019-04-03 NOTE — Addendum Note (Signed)
Addended by: Thamas Jaegers on: 04/03/2019 08:08 AM   Modules accepted: Orders

## 2019-04-03 NOTE — Telephone Encounter (Signed)
Rx sent 

## 2019-04-08 DIAGNOSIS — I8311 Varicose veins of right lower extremity with inflammation: Secondary | ICD-10-CM | POA: Diagnosis not present

## 2019-04-08 DIAGNOSIS — M7981 Nontraumatic hematoma of soft tissue: Secondary | ICD-10-CM | POA: Diagnosis not present

## 2019-05-04 HISTORY — PX: VARICOSE VEIN SURGERY: SHX832

## 2019-05-20 ENCOUNTER — Ambulatory Visit
Admission: RE | Admit: 2019-05-20 | Discharge: 2019-05-20 | Disposition: A | Discharge status: Home / Self Care | Payer: PPO | Source: Ambulatory Visit | Attending: Physician Assistant | Admitting: Physician Assistant

## 2019-05-20 ENCOUNTER — Other Ambulatory Visit: Payer: Self-pay | Admitting: Physician Assistant

## 2019-05-20 DIAGNOSIS — R1032 Left lower quadrant pain: Secondary | ICD-10-CM | POA: Diagnosis not present

## 2019-05-20 DIAGNOSIS — D509 Iron deficiency anemia, unspecified: Secondary | ICD-10-CM | POA: Diagnosis not present

## 2019-05-20 DIAGNOSIS — R109 Unspecified abdominal pain: Secondary | ICD-10-CM

## 2019-05-20 DIAGNOSIS — R197 Diarrhea, unspecified: Secondary | ICD-10-CM | POA: Diagnosis not present

## 2019-05-20 DIAGNOSIS — Z8719 Personal history of other diseases of the digestive system: Secondary | ICD-10-CM | POA: Diagnosis not present

## 2019-05-20 DIAGNOSIS — R1084 Generalized abdominal pain: Secondary | ICD-10-CM | POA: Diagnosis not present

## 2019-05-29 DIAGNOSIS — I10 Essential (primary) hypertension: Secondary | ICD-10-CM | POA: Diagnosis not present

## 2019-05-29 DIAGNOSIS — F419 Anxiety disorder, unspecified: Secondary | ICD-10-CM | POA: Diagnosis not present

## 2019-05-29 DIAGNOSIS — M1990 Unspecified osteoarthritis, unspecified site: Secondary | ICD-10-CM | POA: Diagnosis not present

## 2019-05-29 DIAGNOSIS — E785 Hyperlipidemia, unspecified: Secondary | ICD-10-CM | POA: Diagnosis not present

## 2019-05-29 DIAGNOSIS — D649 Anemia, unspecified: Secondary | ICD-10-CM | POA: Diagnosis not present

## 2019-05-29 DIAGNOSIS — G4733 Obstructive sleep apnea (adult) (pediatric): Secondary | ICD-10-CM | POA: Diagnosis not present

## 2019-06-21 DIAGNOSIS — J0100 Acute maxillary sinusitis, unspecified: Secondary | ICD-10-CM | POA: Diagnosis not present

## 2019-06-23 DIAGNOSIS — G4733 Obstructive sleep apnea (adult) (pediatric): Secondary | ICD-10-CM | POA: Diagnosis not present

## 2019-06-29 DIAGNOSIS — Z8719 Personal history of other diseases of the digestive system: Secondary | ICD-10-CM | POA: Diagnosis not present

## 2019-06-29 DIAGNOSIS — R1032 Left lower quadrant pain: Secondary | ICD-10-CM | POA: Diagnosis not present

## 2019-06-29 DIAGNOSIS — K5900 Constipation, unspecified: Secondary | ICD-10-CM | POA: Diagnosis not present

## 2019-06-29 DIAGNOSIS — D509 Iron deficiency anemia, unspecified: Secondary | ICD-10-CM | POA: Diagnosis not present

## 2019-07-13 ENCOUNTER — Other Ambulatory Visit: Payer: Self-pay

## 2019-07-13 ENCOUNTER — Emergency Department (HOSPITAL_BASED_OUTPATIENT_CLINIC_OR_DEPARTMENT_OTHER): Payer: PPO

## 2019-07-13 ENCOUNTER — Emergency Department (HOSPITAL_COMMUNITY)
Admission: EM | Admit: 2019-07-13 | Discharge: 2019-07-13 | Disposition: A | Discharge status: Home / Self Care | Payer: PPO | Attending: Emergency Medicine | Admitting: Emergency Medicine

## 2019-07-13 DIAGNOSIS — Z79899 Other long term (current) drug therapy: Secondary | ICD-10-CM | POA: Diagnosis not present

## 2019-07-13 DIAGNOSIS — M79604 Pain in right leg: Secondary | ICD-10-CM | POA: Insufficient documentation

## 2019-07-13 DIAGNOSIS — M79605 Pain in left leg: Secondary | ICD-10-CM | POA: Insufficient documentation

## 2019-07-13 DIAGNOSIS — R509 Fever, unspecified: Secondary | ICD-10-CM | POA: Diagnosis not present

## 2019-07-13 DIAGNOSIS — R52 Pain, unspecified: Secondary | ICD-10-CM

## 2019-07-13 DIAGNOSIS — I10 Essential (primary) hypertension: Secondary | ICD-10-CM | POA: Insufficient documentation

## 2019-07-13 DIAGNOSIS — R609 Edema, unspecified: Secondary | ICD-10-CM | POA: Diagnosis not present

## 2019-07-13 LAB — BASIC METABOLIC PANEL
Anion gap: 13 (ref 5–15)
BUN: 19 mg/dL (ref 8–23)
CO2: 24 mmol/L (ref 22–32)
Calcium: 9.5 mg/dL (ref 8.9–10.3)
Chloride: 103 mmol/L (ref 98–111)
Creatinine, Ser: 1.01 mg/dL — ABNORMAL HIGH (ref 0.44–1.00)
GFR calc Af Amer: >60 mL/min (ref >60)
GFR calc non Af Amer: 52 mL/min — ABNORMAL LOW (ref >60)
Glucose, Bld: 125 mg/dL — ABNORMAL HIGH (ref 70–99)
Potassium: 3.7 mmol/L (ref 3.5–5.1)
Sodium: 140 mmol/L (ref 135–145)

## 2019-07-13 LAB — CBC WITH DIFFERENTIAL/PLATELET
Abs Immature Granulocytes: 0.05 10*3/uL (ref 0.00–0.07)
Basophils Absolute: 0 10*3/uL (ref 0.0–0.1)
Basophils Relative: 0 %
Eosinophils Absolute: 0 10*3/uL (ref 0.0–0.5)
Eosinophils Relative: 0 %
HCT: 34.2 % — ABNORMAL LOW (ref 36.0–46.0)
Hemoglobin: 11.4 g/dL — ABNORMAL LOW (ref 12.0–15.0)
Immature Granulocytes: 0 %
Lymphocytes Relative: 8 %
Lymphs Abs: 1 10*3/uL (ref 0.7–4.0)
MCH: 31.8 pg (ref 26.0–34.0)
MCHC: 33.3 g/dL (ref 30.0–36.0)
MCV: 95.5 fL (ref 80.0–100.0)
Monocytes Absolute: 0.8 10*3/uL (ref 0.1–1.0)
Monocytes Relative: 7 %
Neutro Abs: 10.2 10*3/uL — ABNORMAL HIGH (ref 1.7–7.7)
Neutrophils Relative %: 85 %
Platelets: 183 10*3/uL (ref 150–400)
RBC: 3.58 MIL/uL — ABNORMAL LOW (ref 3.87–5.11)
RDW: 12.6 % (ref 11.5–15.5)
WBC: 12 10*3/uL — ABNORMAL HIGH (ref 4.0–10.5)
nRBC: 0 % (ref 0.0–0.2)

## 2019-07-13 MED ORDER — ONDANSETRON 4 MG PO TBDP
4.0000 mg | ORAL_TABLET | Freq: Once | ORAL | Status: AC
  Administered 2019-07-13: 4 mg via ORAL
  Filled 2019-07-13: qty 1

## 2019-07-13 MED ORDER — CEPHALEXIN 250 MG PO CAPS
500.0000 mg | ORAL_CAPSULE | Freq: Once | ORAL | Status: AC
  Administered 2019-07-13: 500 mg via ORAL
  Filled 2019-07-13: qty 2

## 2019-07-13 MED ORDER — CEPHALEXIN 500 MG PO CAPS: 500.0000 mg | ORAL_CAPSULE | Freq: Two times a day (BID) | ORAL | 0 refills | Status: AC

## 2019-07-13 MED ORDER — FENTANYL CITRATE (PF) 100 MCG/2ML IJ SOLN
50.0000 ug | Freq: Once | INTRAMUSCULAR | Status: AC
  Administered 2019-07-13: 50 ug via INTRAVENOUS
  Filled 2019-07-13: qty 2

## 2019-07-13 NOTE — ED Notes (Signed)
PT placed on 2L [place] after being given fentanyl.  Pt fell to sleep and sats dropped to 89%.

## 2019-07-13 NOTE — ED Provider Notes (Signed)
[facility] EMERGENCY DEPARTMENT Provider Note   CSN: [id number] Arrival date & time: 07/13/19  1412     History Chief Complaint  Patient presents with  . Leg Pain    Wanda Green is a 85 y.o. female.  HPI   Patient presents to the emergency room for evaluation of leg pain.  Patient states she started having pain a couple of days ago.  It has been increasing in severity.  Today it was hard for her to even get up and walk.  Patient states the pain is primarily below her her knees bilaterally.  It is tender to the touch.  She denies any trouble with back pain.  She denies abdominal pain.  No chest pain or shortness of breath.  Patient does have a history of varicose veins and had treatment a couple months ago but nothing recently. Past Medical History:  Diagnosis Date  . Anemia   . Arthritis    "knees" (04/09/2017)  . Breast cancer, right breast ([place]) 2000  . Cervical dysplasia age 66  . Claustrophobia    "SEVERE"  . Colitis, ischemic ([place])   . Complication of anesthesia   . Dysrhythmia    hx of a-fib (when her colitis flared up 2016)  back in rhythym  . Elevated cholesterol   . GERD (gastroesophageal reflux disease)    takes prilosec as needed  . Heart attack ([place]) 2016   "mild one";  Dr Terrence Dupont  . Heart murmur   . Hypertension   . OSA on CPAP    tested  2014  . Osteoporosis 06/2016   T score -3.0  . PONV (postoperative nausea and vomiting)     Patient Active Problem List   Diagnosis Date Noted  . Symptomatic anemia 10/30/2018  . Primary osteoarthritis of knee 04/09/2017  . Colitis, ischemic ([place]) 03/18/2017  . Primary osteoarthritis of left knee 01/28/2017  . Acute coronary syndrome ([place]) 11/24/2014  . Colitis 08/31/2014  . Acute colitis 08/31/2014  . OSA (obstructive sleep apnea) 08/12/2012  . Atrial fibrillation ([place]) 06/24/2012  . HTN (hypertension) 06/24/2012  . Other and unspecified hyperlipidemia 06/24/2012  . Allergic rhinitis due  to pollen 06/24/2012  . Breast cancer, stage 2 ([place]) 06/05/2011    Past Surgical History:  Procedure Laterality Date  . BREAST BIOPSY Right 2000  . BREAST LUMPECTOMY Right 2000  . CARDIAC CATHETERIZATION N/A 11/25/2014   Procedure: Left Heart Cath and Coronary Angiography;  Surgeon: Dixie Dials, MD;  Location: [facility];  Service: Cardiovascular;  Laterality: N/A;  . CATARACT EXTRACTION W/ INTRAOCULAR LENS  IMPLANT, BILATERAL Bilateral 2018  . CATARACT EXTRACTION, BILATERAL    . CERVICAL CONE BIOPSY  age 75  . COLONOSCOPY WITH PROPOFOL N/A 09/01/2014   Procedure: COLONOSCOPY WITH PROPOFOL;  Surgeon: Ronald Lobo, MD;  Location: [facility];  Service: Endoscopy;  Laterality: N/A;  this will be done unprepped  . COLPOSCOPY    . FRACTURE SURGERY    . JOINT REPLACEMENT    . KNEE ARTHROSCOPY Bilateral   . NASAL SEPTUM SURGERY  12/14  . TOTAL KNEE ARTHROPLASTY Left 04/09/2017  . TOTAL KNEE ARTHROPLASTY Left 04/09/2017   Procedure: LEFT TOTAL KNEE ARTHROPLASTY;  Surgeon: [name], MD;  Location: [facility];  Service: Orthopedics;  Laterality: Left;  . TUBAL LIGATION    . WRIST FRACTURE SURGERY Left ~ 2006     OB History    Gravida  3   Para  3  Term  3   Preterm      AB      Living  2     SAB      TAB      Ectopic      Multiple      Live Births              Family History  Problem Relation Age of Onset  . Hypertension Mother   . Heart failure Mother   . Heart disease Mother   . Hypertension Father   . Heart disease Father   . Hypertension Sister   . Uterine cancer Sister   . Hypertension Brother   . Heart disease Brother     Social History   Tobacco Use  . Smoking status: Former Smoker    Packs/day: 0.10    Years: 3.00    Pack years: 0.30    Types: Cigarettes    Quit date: 03/05/1972    Years since quitting: 47.3  . Smokeless tobacco: Never Used  . Tobacco comment: ONE CIG A DAY  Substance Use Topics  . Alcohol use: No     Alcohol/week: 0.0 standard drinks  . Drug use: No    Home Medications Prior to Admission medications   Medication Sig Start Date End Date Taking? Authorizing Provider  amLODipine (NORVASC) 2.5 MG tablet Take 2.5 mg by mouth at bedtime. 11/15/14  Yes [provider]  aspirin 81 MG EC tablet Take 1 tablet (81 mg total) by mouth daily. After you have completed 5 day Rx of Apixiban (Eliquis) Patient taking differently: Take 81 mg by mouth daily.  04/09/17  Yes Prudencio Burly III, PA-C  atorvastatin (LIPITOR) 20 MG tablet Take 10 mg by mouth every Monday, Wednesday, and Friday.    Yes [provider]  ferrous sulfate 325 (65 FE) MG tablet Take 1 tablet (325 mg total) by mouth daily with breakfast. 11/01/18  Yes Vann, Jessica U, DO  losartan (COZAAR) 100 MG tablet Take 100 mg by mouth daily.   Yes [provider]  metoprolol succinate (TOPROL-XL) 25 MG 24 hr tablet Take 25-50 mg by mouth See admin instructions. Take 25mg  in the morning and 50mg  at night.   Yes [provider]  omeprazole (PRILOSEC) 20 MG capsule Take 20 mg by mouth 2 (two) times daily. 05/15/19  Yes [provider]  oxybutynin (DITROPAN) 5 MG tablet Take 1 tablet (5 mg total) by mouth 3 (three) times daily. 04/03/19  Yes Joseph Pierini, MD  omeprazole (PRILOSEC) 40 MG capsule Take 1 capsule (40 mg total) by mouth daily as needed. Patient not taking: Reported on 07/13/2019 07/14/12   Leandrew Koyanagi, MD    Allergies    Penicillins, Atorvastatin, Augmentin [amoxicillin-pot clavulanate], Codeine, Doxycycline, Hydrocodone, and Oxycodone-acetaminophen  Review of Systems   Review of Systems  All other systems reviewed and are negative.   Physical Exam Updated Vital Signs Ht 1.6 m (5\' 3" )   Wt 73.5 kg   LMP 07/22/2015   BMI 28.70 kg/m   Physical Exam Vitals and nursing note reviewed.  Constitutional:      General: She is not in acute distress.    Appearance: She is  well-developed.  HENT:     Head: Normocephalic and atraumatic.     Right Ear: External ear normal.     Left Ear: External ear normal.  Eyes:     General: No scleral icterus.       Right eye: No  discharge.        Left eye: No discharge.     Conjunctiva/sclera: Conjunctivae normal.  Neck:     Trachea: No tracheal deviation.  Cardiovascular:     Rate and Rhythm: Normal rate and regular rhythm.  Pulmonary:     Effort: Pulmonary effort is normal. No respiratory distress.     Breath sounds: Normal breath sounds. No stridor. No wheezing or rales.  Abdominal:     General: Bowel sounds are normal. There is no distension.     Palpations: Abdomen is soft.     Tenderness: There is no abdominal tenderness. There is no guarding or rebound.  Musculoskeletal:        General: No tenderness.     Cervical back: Neck supple.     Comments: No tenderness palpation of the back, patient is able to sit up without difficulty, pain with movement of her lower legs, tenderness palpation bilaterally, mild erythema of the left.  Right, strong dorsalis pedis pulses bilaterally, no cyanosis  Skin:    General: Skin is warm and dry.     Findings: No rash.  Neurological:     Mental Status: She is alert.     Cranial Nerves: No cranial nerve deficit (no facial droop, extraocular movements intact, no slurred speech).     Sensory: No sensory deficit.     Motor: No abnormal muscle tone or seizure activity.     Coordination: Coordination normal.     ED Results / Procedures / Treatments   Labs (all labs ordered are listed, but only abnormal results are displayed) Labs Reviewed - No data to display  EKG None  Radiology No results found.  Procedures Procedures (including critical care time)  Medications Ordered in ED Medications  fentaNYL (SUBLIMAZE) injection 50 mcg (has no administration in time range)    ED Course  I have reviewed the triage vital signs and the nursing notes.  Pertinent labs &  imaging results that were available during my care of the patient were reviewed by me and considered in my medical decision making (see chart for details).    MDM Rules/Calculators/A&P                      Patient has bilateral leg pain.  She has normal perfusion's.  No signs of acute arterial occlusion.  Symptoms do not seem to be radicular in nature.  She has no back pain.  Patient does have some erythema and some tenderness.  Cellulitis is a concern.  Deep venous thrombosis is also a concern.  I have ordered laboratory tests and a Doppler ultrasound.  Labs show elevated wbc.    Care turned over to Dr Langston Masker Final Clinical Impression(s) / ED Diagnoses pending   Dorie Rank, MD 07/13/19 [phone]

## 2019-07-13 NOTE — ED Provider Notes (Signed)
Clinical Course as of Jul 12 2340  Mon Jul 13, 2019  1646 Pt signed out to me by Dr. Tomi Bamberger.  Briefly 85 yo female presenting with bilateral leg pain for several days, pain below her knees.  No numbness.  Hx of varicose veins treated in March of this year.  She has good palpable pulses pedal in both feet.   No radiculopathy on her exam with EDP.  Here plan is pending DVT study. If negative, likely will treat for cellulitis/infection, as she does have warmth of the lower leg, WBC 12K.   [MT]  [id number] On my exam she does have bilateral lower extremity symmetrical pain below the knees, diffuse, with mild tenderness.  I don't feel there is significant warmth or erythema, but in the setting of her temp 99.52F and her leukocytosis, I do think a trial of antibiotics for cellulitis would be reasonable.  We'll do Keflex, as she does not tolerate doxcycline well, and I do not have reason to suspect MRSA.  We are still awaiting DVT ultrasound.  If negative, she has been able to get up and bear weight, she can go home on antibx   [MT]  1830 Patient continues to feel fine.  She feels her pain is under control.  She and her daughter prefer not to have opioids or narcotics at home.  She was told to avoid NSAIDs.  Advised her then that she can stay on Tylenol.  We will continue a course of Keflex for 1 week.  She can follow-up with her PCP this week   [MT]    Clinical Course User Index [MT] Jolee Critcher, Carola Rhine, MD      Wyvonnia Dusky, MD 07/13/19 [phone]

## 2019-07-13 NOTE — ED Notes (Signed)
[facility] in room

## 2019-07-13 NOTE — ED Notes (Signed)
[facility] notified and stated they were coming shortly to get pt.

## 2019-07-13 NOTE — ED Triage Notes (Addendum)
Pt. Arrived [facility] stating she had some varicose veins in both legs and had increase pain in the last 2 days. Patient stated she had taken Tylenol however it was ineffective. EMS VS- 146/80,92,20,98%, 97.5. Patient alert and oriented x4, stating she wasn't able to put weight on her legs.  Patient states she vomits when she in pain and she now have a headache

## 2019-07-13 NOTE — Progress Notes (Signed)
VASCULAR LAB PRELIMINARY  PRELIMINARY  PRELIMINARY  PRELIMINARY  Bilateral lower extremity venous duplex completed.    Preliminary report:  See CV proc for preliminary results.   Mylinh Cragg, RVT 07/13/2019, 6:05 PM

## 2019-07-13 NOTE — Discharge Instructions (Signed)
You were diagnosed with leg pain today in the ER.  As we discussed, this can be due to a number of issues.   We decided to start you on a 7 day course of Keflex, an antibiotic, to treat you for a possible cellulitis.  This was due in part to your white blood cell count being slightly above normal, which can indicate an infection.  Your ultrasound did not show signs of blood clots in your legs.  We also discussed the possibility of a disc herniation in your lower back.  This can cause pain in the lower legs, although it is rare for this to occur in both legs.    You should follow up with your primary care doctor in the office in 2 days to see how you are doing.    If you begin having worsening pain, new redness streaking up your legs, fevers (temperature above 100.27F) or any other acute concerning symptoms, you should return to the ER.

## 2019-07-15 ENCOUNTER — Other Ambulatory Visit: Payer: Self-pay

## 2019-07-15 ENCOUNTER — Ambulatory Visit (HOSPITAL_COMMUNITY)
Admission: EM | Admit: 2019-07-15 | Discharge: 2019-07-15 | Disposition: A | Discharge status: Home / Self Care | Payer: PPO | Attending: Family Medicine | Admitting: Family Medicine

## 2019-07-15 ENCOUNTER — Encounter (HOSPITAL_COMMUNITY): Payer: Self-pay

## 2019-07-15 DIAGNOSIS — L03115 Cellulitis of right lower limb: Secondary | ICD-10-CM | POA: Diagnosis not present

## 2019-07-15 LAB — CBC
HCT: 34.6 % — ABNORMAL LOW (ref 36.0–46.0)
Hemoglobin: 11.4 g/dL — ABNORMAL LOW (ref 12.0–15.0)
MCH: 31.6 pg (ref 26.0–34.0)
MCHC: 32.9 g/dL (ref 30.0–36.0)
MCV: 95.8 fL (ref 80.0–100.0)
Platelets: 207 10*3/uL (ref 150–400)
RBC: 3.61 MIL/uL — ABNORMAL LOW (ref 3.87–5.11)
RDW: 12.5 % (ref 11.5–15.5)
WBC: 6.9 10*3/uL (ref 4.0–10.5)
nRBC: 0 % (ref 0.0–0.2)

## 2019-07-15 NOTE — ED Triage Notes (Signed)
Patient states that she is here to re-checked for her cellulitis on bilateral legs. States that she was seen in the [facility] ED 2 days ago. Reports that her pain is better today and has been taking her Keflex.

## 2019-07-15 NOTE — ED Provider Notes (Signed)
[place]    CSN: [id number] Arrival date & time: 07/15/19  1929      History   Chief Complaint Chief Complaint  Patient presents with  . Leg Pain    HPI Wanda Green is a 85 y.o. female.   She is following up for her bilateral leg pain.  She was seen in the emergency department 2 days ago.  She was started on Keflex and since that time her leg pain has improved.  She did have a leukocytosis at that time and needs a recheck.  Denies any fevers.  Denies any swelling  HPI  Past Medical History:  Diagnosis Date  . Anemia   . Arthritis    "knees" (04/09/2017)  . Breast cancer, right breast ([place]) 2000  . Cervical dysplasia age 56  . Claustrophobia    "SEVERE"  . Colitis, ischemic ([place])   . Complication of anesthesia   . Dysrhythmia    hx of a-fib (when her colitis flared up 2016)  back in rhythym  . Elevated cholesterol   . GERD (gastroesophageal reflux disease)    takes prilosec as needed  . Heart attack ([place]) 2016   "mild one";  Dr Terrence Dupont  . Heart murmur   . Hypertension   . OSA on CPAP    tested  2014  . Osteoporosis 06/2016   T score -3.0  . PONV (postoperative nausea and vomiting)     Patient Active Problem List   Diagnosis Date Noted  . Symptomatic anemia 10/30/2018  . Primary osteoarthritis of knee 04/09/2017  . Colitis, ischemic ([place]) 03/18/2017  . Primary osteoarthritis of left knee 01/28/2017  . Acute coronary syndrome ([place]) 11/24/2014  . Colitis 08/31/2014  . Acute colitis 08/31/2014  . OSA (obstructive sleep apnea) 08/12/2012  . Atrial fibrillation ([place]) 06/24/2012  . HTN (hypertension) 06/24/2012  . Other and unspecified hyperlipidemia 06/24/2012  . Allergic rhinitis due to pollen 06/24/2012  . Breast cancer, stage 2 ([place]) 06/05/2011    Past Surgical History:  Procedure Laterality Date  . BREAST BIOPSY Right 2000  . BREAST LUMPECTOMY Right 2000  . CARDIAC CATHETERIZATION N/A 11/25/2014   Procedure: Left Heart Cath and  Coronary Angiography;  Surgeon: Dixie Dials, MD;  Location: [facility] [facility];  Service: Cardiovascular;  Laterality: N/A;  . CATARACT EXTRACTION W/ INTRAOCULAR LENS  IMPLANT, BILATERAL Bilateral 2018  . CATARACT EXTRACTION, BILATERAL    . CERVICAL CONE BIOPSY  age 59  . COLONOSCOPY WITH PROPOFOL N/A 09/01/2014   Procedure: COLONOSCOPY WITH PROPOFOL;  Surgeon: Ronald Lobo, MD;  Location: [place];  Service: Endoscopy;  Laterality: N/A;  this will be done unprepped  . COLPOSCOPY    . FRACTURE SURGERY    . JOINT REPLACEMENT    . KNEE ARTHROSCOPY Bilateral   . NASAL SEPTUM SURGERY  12/14  . TOTAL KNEE ARTHROPLASTY Left 04/09/2017  . TOTAL KNEE ARTHROPLASTY Left 04/09/2017   Procedure: LEFT TOTAL KNEE ARTHROPLASTY;  Surgeon: [name], MD;  Location: [place];  Service: Orthopedics;  Laterality: Left;  . TUBAL LIGATION    . WRIST FRACTURE SURGERY Left ~ 2006    OB History    Gravida  3   Para  3   Term  3   Preterm      AB      Living  2     SAB      TAB      Ectopic      Multiple  Live Births               Home Medications    Prior to Admission medications   Medication Sig Start Date End Date Taking? Authorizing Provider  amLODipine (NORVASC) 2.5 MG tablet Take 2.5 mg by mouth at bedtime. 11/15/14  Yes [provider]  aspirin 81 MG EC tablet Take 1 tablet (81 mg total) by mouth daily. After you have completed 5 day Rx of Apixiban (Eliquis) Patient taking differently: Take 81 mg by mouth daily.  04/09/17  Yes Prudencio Burly III, PA-C  atorvastatin (LIPITOR) 20 MG tablet Take 10 mg by mouth every Monday, Wednesday, and Friday.    Yes [provider]  cephALEXin (KEFLEX) 500 MG capsule Take 1 capsule (500 mg total) by mouth 2 (two) times daily for 7 days. 07/14/19 07/21/19 Yes Trifan, Carola Rhine, MD  ferrous sulfate 325 (65 FE) MG tablet Take 1 tablet (325 mg total) by mouth daily with breakfast. 11/01/18  Yes Vann, Jessica U, DO    losartan (COZAAR) 100 MG tablet Take 100 mg by mouth daily.   Yes [provider]  metoprolol succinate (TOPROL-XL) 25 MG 24 hr tablet Take 25-50 mg by mouth See admin instructions. Take 25mg  in the morning and 50mg  at night.   Yes [provider]  omeprazole (PRILOSEC) 20 MG capsule Take 20 mg by mouth 2 (two) times daily. 05/15/19  Yes [provider]  omeprazole (PRILOSEC) 40 MG capsule Take 1 capsule (40 mg total) by mouth daily as needed. 07/14/12  Yes Leandrew Koyanagi, MD  oxybutynin (DITROPAN) 5 MG tablet Take 1 tablet (5 mg total) by mouth 3 (three) times daily. 04/03/19  Yes Joseph Pierini, MD    Family History Family History  Problem Relation Age of Onset  . Hypertension Mother   . Heart failure Mother   . Heart disease Mother   . Hypertension Father   . Heart disease Father   . Hypertension Sister   . Uterine cancer Sister   . Hypertension Brother   . Heart disease Brother     Social History Social History   Tobacco Use  . Smoking status: Former Smoker    Packs/day: 0.10    Years: 3.00    Pack years: 0.30    Types: Cigarettes    Quit date: 03/05/1972    Years since quitting: 47.3  . Smokeless tobacco: Never Used  . Tobacco comment: ONE CIG A DAY  Substance Use Topics  . Alcohol use: No    Alcohol/week: 0.0 standard drinks  . Drug use: No     Allergies   Penicillins, Atorvastatin, Augmentin [amoxicillin-pot clavulanate], Codeine, Doxycycline, Hydrocodone, and Oxycodone-acetaminophen   Review of Systems Review of Systems  See HPI  Physical Exam Triage Vital Signs ED Triage Vitals  Enc Vitals Group     BP 07/15/19 1941 (!) 153/80     Pulse Rate 07/15/19 1941 74     Resp 07/15/19 1941 18     Temp 07/15/19 1941 97.8 F (36.6 C)     Temp Source 07/15/19 1941 Oral     SpO2 07/15/19 1941 100 %     Weight 07/15/19 1940 160 lb (72.6 kg)     Height 07/15/19 1940 5' 3.5" (1.613 m)     Head Circumference --      Peak Flow --       Pain Score 07/15/19 1939 1     Pain Loc --  Pain Edu? --      Excl. in [place]? --    No data found.  Updated Vital Signs BP (!) 153/80 (BP Location: Left Arm)   Pulse 74   Temp 97.8 F (36.6 C) (Oral)   Resp 18   Ht 5' 3.5" (1.613 m)   Wt 72.6 kg   LMP 07/22/2015   SpO2 100%   BMI 27.90 kg/m   Visual Acuity Right Eye Distance:   Left Eye Distance:   Bilateral Distance:    Right Eye Near:   Left Eye Near:    Bilateral Near:     Physical Exam Gen: NAD, alert, cooperative with exam, well-appearing ENT: normal lips, normal nasal mucosa,  Skin: no rashes, no areas of induration, no warmth or streaking Neuro: normal tone, normal sensation to touch Psych:  normal insight, alert and oriented MSK:  Normal knee range of motion. Normal ankle range of motion. Neurovascularly intact   UC Treatments / Results  Labs (all labs ordered are listed, but only abnormal results are displayed) Labs Reviewed  CBC    EKG   Radiology No results found.  Procedures Procedures (including critical care time)  Medications Ordered in UC Medications - No data to display  Initial Impression / Assessment and Plan / UC Course  I have reviewed the triage vital signs and the nursing notes.  Pertinent labs & imaging results that were available during my care of the patient were reviewed by me and considered in my medical decision making (see chart for details).     Ms. Gros is an 85 year old female is presenting with bilateral leg pain.  She was seen in the emergency department and diagnosed with cellulitis.  She was started on Keflex and has had improvement of her symptoms.  She had a leukocytosis at that time.  We will check a CBC today.  Given indications to follow-up return.  Final Clinical Impressions(s) / UC Diagnoses   Final diagnoses:  Cellulitis of right lower extremity     Discharge Instructions     Please take the keflex for 7 days  We will call if your blood  panel is worse.  Please follow up if your symptoms worsen or fail to improve.     ED Prescriptions    None     PDMP not reviewed this encounter.   Rosemarie Ax, MD 07/15/19 2022

## 2019-07-15 NOTE — Discharge Instructions (Addendum)
Please take the keflex for 7 days  We will call if your blood panel is worse.  Please follow up if your symptoms worsen or fail to improve.

## 2019-08-05 DIAGNOSIS — H43392 Other vitreous opacities, left eye: Secondary | ICD-10-CM | POA: Diagnosis not present

## 2019-08-05 DIAGNOSIS — H43812 Vitreous degeneration, left eye: Secondary | ICD-10-CM | POA: Diagnosis not present

## 2019-08-10 ENCOUNTER — Ambulatory Visit (HOSPITAL_COMMUNITY)
Admission: EM | Admit: 2019-08-10 | Discharge: 2019-08-10 | Disposition: A | Discharge status: Home / Self Care | Payer: PPO | Attending: Family Medicine | Admitting: Family Medicine

## 2019-08-10 ENCOUNTER — Other Ambulatory Visit: Payer: Self-pay

## 2019-08-10 DIAGNOSIS — L03115 Cellulitis of right lower limb: Secondary | ICD-10-CM

## 2019-08-10 MED ORDER — ONDANSETRON 4 MG PO TBDP: 4.0000 mg | ORAL_TABLET | Freq: Three times a day (TID) | ORAL | 0 refills | Status: DC | PRN

## 2019-08-10 MED ORDER — DOXYCYCLINE HYCLATE 100 MG PO CAPS: 100.0000 mg | ORAL_CAPSULE | Freq: Two times a day (BID) | ORAL | 0 refills | Status: DC

## 2019-08-10 NOTE — Discharge Instructions (Signed)
Begin doxycycline- take zofran 30 min prior to taking Please call if not tolerating to switch to different antibiotic Elevate leg Follow up if not improving or worsening

## 2019-08-10 NOTE — ED Triage Notes (Signed)
Pt c/o redness and swelling to right ankle and foot. Recently treated for cellulitis to both legs.

## 2019-08-11 NOTE — ED Provider Notes (Signed)
[place]    CSN: [id number] Arrival date & time: 08/10/19  1723      History   Chief Complaint Chief Complaint  Patient presents with  . Leg Swelling    HPI Wanda Green is a 85 y.o. female history of GERD, hypertension, PUD, presenting today for evaluation of right ankle redness and swelling.  Patient reports over the past 1-2 days she has had increased redness and swelling noted to right anterior ankle.  She is concerned as a few weeks ago she was recently treated for cellulitis to bilateral lower extremities.  She was on Keflex for this, symptoms did fully resolved.  She denies any fevers dizziness or lightheadedness.  Denies calf pain.    HPI  Past Medical History:  Diagnosis Date  . Anemia   . Arthritis    "knees" (04/09/2017)  . Breast cancer, right breast ([place]) 2000  . Cervical dysplasia age 57  . Claustrophobia    "SEVERE"  . Colitis, ischemic ([place])   . Complication of anesthesia   . Dysrhythmia    hx of a-fib (when her colitis flared up 2016)  back in rhythym  . Elevated cholesterol   . GERD (gastroesophageal reflux disease)    takes prilosec as needed  . Heart attack ([place]) 2016   "mild one";  Dr Terrence Dupont  . Heart murmur   . Hypertension   . OSA on CPAP    tested  2014  . Osteoporosis 06/2016   T score -3.0  . PONV (postoperative nausea and vomiting)     Patient Active Problem List   Diagnosis Date Noted  . Symptomatic anemia 10/30/2018  . Primary osteoarthritis of knee 04/09/2017  . Colitis, ischemic ([place]) 03/18/2017  . Primary osteoarthritis of left knee 01/28/2017  . Acute coronary syndrome ([place]) 11/24/2014  . Colitis 08/31/2014  . Acute colitis 08/31/2014  . OSA (obstructive sleep apnea) 08/12/2012  . Atrial fibrillation ([place]) 06/24/2012  . HTN (hypertension) 06/24/2012  . Other and unspecified hyperlipidemia 06/24/2012  . Allergic rhinitis due to pollen 06/24/2012  . Breast cancer, stage 2 ([place]) 06/05/2011    Past Surgical  History:  Procedure Laterality Date  . BREAST BIOPSY Right 2000  . BREAST LUMPECTOMY Right 2000  . CARDIAC CATHETERIZATION N/A 11/25/2014   Procedure: Left Heart Cath and Coronary Angiography;  Surgeon: Dixie Dials, MD;  Location: [facility];  Service: Cardiovascular;  Laterality: N/A;  . CATARACT EXTRACTION W/ INTRAOCULAR LENS  IMPLANT, BILATERAL Bilateral 2018  . CATARACT EXTRACTION, BILATERAL    . CERVICAL CONE BIOPSY  age 21  . COLONOSCOPY WITH PROPOFOL N/A 09/01/2014   Procedure: COLONOSCOPY WITH PROPOFOL;  Surgeon: Ronald Lobo, MD;  Location: [facility];  Service: Endoscopy;  Laterality: N/A;  this will be done unprepped  . COLPOSCOPY    . FRACTURE SURGERY    . JOINT REPLACEMENT    . KNEE ARTHROSCOPY Bilateral   . NASAL SEPTUM SURGERY  12/14  . TOTAL KNEE ARTHROPLASTY Left 04/09/2017  . TOTAL KNEE ARTHROPLASTY Left 04/09/2017   Procedure: LEFT TOTAL KNEE ARTHROPLASTY;  Surgeon: [name], MD;  Location: [facility];  Service: Orthopedics;  Laterality: Left;  . TUBAL LIGATION    . WRIST FRACTURE SURGERY Left ~ 2006    OB History    Gravida  3   Para  3   Term  3   Preterm      AB      Living  2  SAB      TAB      Ectopic      Multiple      Live Births               Home Medications    Prior to Admission medications   Medication Sig Start Date End Date Taking? Authorizing Provider  amLODipine (NORVASC) 2.5 MG tablet Take 2.5 mg by mouth at bedtime. 11/15/14   [provider]  aspirin 81 MG EC tablet Take 1 tablet (81 mg total) by mouth daily. After you have completed 5 day Rx of Apixiban (Eliquis) Patient taking differently: Take 81 mg by mouth daily.  04/09/17   Prudencio Burly III, PA-C  atorvastatin (LIPITOR) 20 MG tablet Take 10 mg by mouth every Monday, Wednesday, and Friday.     [provider]  doxycycline (VIBRAMYCIN) 100 MG capsule Take 1 capsule (100 mg total) by mouth 2 (two) times daily. 08/10/19    Julianny Milstein C, PA-C  ferrous sulfate 325 (65 FE) MG tablet Take 1 tablet (325 mg total) by mouth daily with breakfast. 11/01/18   Geradine Girt, DO  losartan (COZAAR) 100 MG tablet Take 100 mg by mouth daily.    [provider]  metoprolol succinate (TOPROL-XL) 25 MG 24 hr tablet Take 25-50 mg by mouth See admin instructions. Take 25mg  in the morning and 50mg  at night.    [provider]  omeprazole (PRILOSEC) 20 MG capsule Take 20 mg by mouth 2 (two) times daily. 05/15/19   [provider]  omeprazole (PRILOSEC) 40 MG capsule Take 1 capsule (40 mg total) by mouth daily as needed. 07/14/12   Leandrew Koyanagi, MD  ondansetron (ZOFRAN ODT) 4 MG disintegrating tablet Take 1 tablet (4 mg total) by mouth every 8 (eight) hours as needed for nausea or vomiting. Take 30 minutes prior to taking doxycycline 08/10/19   Amarissa Koerner C, PA-C  oxybutynin (DITROPAN) 5 MG tablet Take 1 tablet (5 mg total) by mouth 3 (three) times daily. 04/03/19   Joseph Pierini, MD    Family History Family History  Problem Relation Age of Onset  . Hypertension Mother   . Heart failure Mother   . Heart disease Mother   . Hypertension Father   . Heart disease Father   . Hypertension Sister   . Uterine cancer Sister   . Hypertension Brother   . Heart disease Brother     Social History Social History   Tobacco Use  . Smoking status: Former Smoker    Packs/day: 0.10    Years: 3.00    Pack years: 0.30    Types: Cigarettes    Quit date: 03/05/1972    Years since quitting: 47.4  . Smokeless tobacco: Never Used  . Tobacco comment: ONE CIG A DAY  Substance Use Topics  . Alcohol use: No    Alcohol/week: 0.0 standard drinks  . Drug use: No     Allergies   Penicillins, Atorvastatin, Augmentin [amoxicillin-pot clavulanate], Codeine, Doxycycline, Hydrocodone, and Oxycodone-acetaminophen   Review of Systems Review of Systems  Constitutional: Negative for fatigue and fever.  Eyes:  Negative for visual disturbance.  Respiratory: Negative for shortness of breath.   Cardiovascular: Negative for chest pain.  Gastrointestinal: Negative for abdominal pain, nausea and vomiting.  Musculoskeletal: Positive for joint swelling. Negative for arthralgias.  Skin: Positive for color change. Negative for rash and wound.  Neurological: Negative for dizziness, weakness, light-headedness and headaches.  Physical Exam Triage Vital Signs ED Triage Vitals  Enc Vitals Group     BP 08/10/19 1805 (!) 164/83     Pulse Rate 08/10/19 1804 (!) 57     Resp 08/10/19 1804 16     Temp 08/10/19 1804 98.4 F (36.9 C)     Temp src --      SpO2 08/10/19 1804 96 %     Weight --      Height --      Head Circumference --      Peak Flow --      Pain Score 08/10/19 1804 5     Pain Loc --      Pain Edu? --      Excl. in [place]? --    No data found.  Updated Vital Signs BP (!) 164/83   Pulse (!) 57   Temp 98.4 F (36.9 C)   Resp 16   LMP 07/22/2015   SpO2 96%   Visual Acuity Right Eye Distance:   Left Eye Distance:   Bilateral Distance:    Right Eye Near:   Left Eye Near:    Bilateral Near:     Physical Exam Vitals and nursing note reviewed.  Constitutional:      Appearance: She is well-developed.     Comments: No acute distress  HENT:     Head: Normocephalic and atraumatic.     Nose: Nose normal.  Eyes:     Conjunctiva/sclera: Conjunctivae normal.  Cardiovascular:     Rate and Rhythm: Normal rate.  Pulmonary:     Effort: Pulmonary effort is normal. No respiratory distress.  Abdominal:     General: There is no distension.  Musculoskeletal:        General: Normal range of motion.     Cervical back: Neck supple.     Comments: No calf swelling Dorsalis pedis 2+ on right  Skin:    General: Skin is warm and dry.     Comments: Right lower leg: 5 cm x 2 cm area of erythema, swelling noted about ankle, erythema and swelling does not extend circumferentially  Bilateral  distal lower legs with hyperpigmentation  Neurological:     Mental Status: She is alert and oriented to person, place, and time.      UC Treatments / Results  Labs (all labs ordered are listed, but only abnormal results are displayed) Labs Reviewed - No data to display  EKG   Radiology No results found.  Procedures Procedures (including critical care time)  Medications Ordered in UC Medications - No data to display  Initial Impression / Assessment and Plan / UC Course  I have reviewed the triage vital signs and the nursing notes.  Pertinent labs & imaging results that were available during my care of the patient were reviewed by me and considered in my medical decision making (see chart for details).     Area to right lower leg concerning for recurrent cellulitis.  Acting different antibiotic therapy given within a few weeks of prior treatment.  Discussed with patient options and patient wished to proceed with doxycycline, reports that she has some nausea and upset stomach with taking this, will provide Zofran to use prior to taking.  Advised to call if unable to tolerate an we will switch her to clindamycin.  Elevate leg and monitor for gradual improvement of swelling and erythema.  Low suspicion of DVT at this time.  Discussed strict return precautions. Patient verbalized understanding and is  agreeable with plan.  Final Clinical Impressions(s) / UC Diagnoses   Final diagnoses:  Cellulitis of right lower extremity     Discharge Instructions     Begin doxycycline- take zofran 30 min prior to taking Please call if not tolerating to switch to different antibiotic Elevate leg Follow up if not improving or worsening   ED Prescriptions    Medication Sig Dispense Auth. Provider   doxycycline (VIBRAMYCIN) 100 MG capsule Take 1 capsule (100 mg total) by mouth 2 (two) times daily. 20 capsule Zaineb Nowaczyk C, PA-C   ondansetron (ZOFRAN ODT) 4 MG disintegrating tablet Take  1 tablet (4 mg total) by mouth every 8 (eight) hours as needed for nausea or vomiting. Take 30 minutes prior to taking doxycycline 20 tablet Lakeita Panther, [place] C, PA-C     PDMP not reviewed this encounter.   Hillary Schwegler, [place] C, PA-C 08/11/19 [phone]

## 2019-08-14 DIAGNOSIS — I87323 Chronic venous hypertension (idiopathic) with inflammation of bilateral lower extremity: Secondary | ICD-10-CM | POA: Diagnosis not present

## 2019-09-02 DIAGNOSIS — I8311 Varicose veins of right lower extremity with inflammation: Secondary | ICD-10-CM | POA: Diagnosis not present

## 2019-09-02 DIAGNOSIS — I10 Essential (primary) hypertension: Secondary | ICD-10-CM | POA: Diagnosis not present

## 2019-09-02 DIAGNOSIS — D649 Anemia, unspecified: Secondary | ICD-10-CM | POA: Diagnosis not present

## 2019-09-02 DIAGNOSIS — E785 Hyperlipidemia, unspecified: Secondary | ICD-10-CM | POA: Diagnosis not present

## 2019-09-02 DIAGNOSIS — I8312 Varicose veins of left lower extremity with inflammation: Secondary | ICD-10-CM | POA: Diagnosis not present

## 2019-09-03 ENCOUNTER — Other Ambulatory Visit: Payer: Self-pay

## 2019-09-03 ENCOUNTER — Ambulatory Visit (INDEPENDENT_AMBULATORY_CARE_PROVIDER_SITE_OTHER): Payer: PPO | Admitting: Family Medicine

## 2019-09-03 ENCOUNTER — Encounter: Payer: Self-pay | Admitting: Family Medicine

## 2019-09-03 VITALS — BP 118/70 | HR 71 | Ht 63.5 in | Wt 166.0 lb

## 2019-09-03 DIAGNOSIS — D509 Iron deficiency anemia, unspecified: Secondary | ICD-10-CM | POA: Diagnosis not present

## 2019-09-03 DIAGNOSIS — G4733 Obstructive sleep apnea (adult) (pediatric): Secondary | ICD-10-CM

## 2019-09-03 DIAGNOSIS — I10 Essential (primary) hypertension: Secondary | ICD-10-CM

## 2019-09-03 DIAGNOSIS — R7303 Prediabetes: Secondary | ICD-10-CM | POA: Diagnosis not present

## 2019-09-03 DIAGNOSIS — K559 Vascular disorder of intestine, unspecified: Secondary | ICD-10-CM

## 2019-09-03 DIAGNOSIS — I4891 Unspecified atrial fibrillation: Secondary | ICD-10-CM | POA: Diagnosis not present

## 2019-09-03 DIAGNOSIS — I252 Old myocardial infarction: Secondary | ICD-10-CM | POA: Diagnosis not present

## 2019-09-03 DIAGNOSIS — Z853 Personal history of malignant neoplasm of breast: Secondary | ICD-10-CM | POA: Diagnosis not present

## 2019-09-03 LAB — CBC
HCT: 33.8 % — ABNORMAL LOW (ref 36.0–46.0)
Hemoglobin: 11.5 g/dL — ABNORMAL LOW (ref 12.0–15.0)
MCHC: 34.1 g/dL (ref 30.0–36.0)
MCV: 94.7 fl (ref 78.0–100.0)
Platelets: 196 10*3/uL (ref 150.0–400.0)
RBC: 3.57 Mil/uL — ABNORMAL LOW (ref 3.87–5.11)
RDW: 13.7 % (ref 11.5–15.5)
WBC: 6.9 10*3/uL (ref 4.0–10.5)

## 2019-09-03 LAB — HEMOGLOBIN A1C: Hgb A1c MFr Bld: 5.8 % (ref 4.6–6.5)

## 2019-09-03 NOTE — Assessment & Plan Note (Signed)
On statin and asa. Follows with cardiology

## 2019-09-03 NOTE — Assessment & Plan Note (Signed)
Follows with Dr. Elsworth Soho. Using CPAP at night

## 2019-09-03 NOTE — Assessment & Plan Note (Signed)
Taking metoprolol 25 AM and 50 PM. Not on blood thinner due to GI bleed with ischemic colitis

## 2019-09-03 NOTE — Assessment & Plan Note (Signed)
Stable on losartan and amlodipine

## 2019-09-03 NOTE — Assessment & Plan Note (Signed)
Discussed could stop mammograms when she would not want treatment. At this point she will continue yearly screening.

## 2019-09-03 NOTE — Assessment & Plan Note (Signed)
Lab Results  Component Value Date   HGBA1C 6.1 (H) 04/09/2012   From several years ago. CBGs are slightly elevated but still fairly low. Will recheck today. Already following healthy diet and exercising. Follow-up based on results

## 2019-09-03 NOTE — Assessment & Plan Note (Signed)
Contraindication to blood thinner for the afib. Feels healthy and doing well. No recent bleeds. Taking iron

## 2019-09-03 NOTE — Assessment & Plan Note (Signed)
Slightly low at recent ER visit. Will repeat. Follows with GI due to hx of bleed. Continue iron

## 2019-09-03 NOTE — Progress Notes (Signed)
Subjective:     Wanda Green is a 85 y.o. female presenting for Establish Care     HPI   #varicose veins - has had surgery several times - recent cellulitis but her vascular surgeon thinks it was related to her veins - Wanda Green  #iron deficiency - Wanda Green - GI provider  #Breast cancer 2000 - s/p lumpectomy - s/p 5 years of tamoxifen  - has been getting mammograms since at Wanda Green Use  Smoking Status Former Smoker  . Packs/day: 0.10  . Years: 3.00  . Pack years: 0.30  . Types: Cigarettes  . Quit date: 03/05/1972  . Years since quitting: 47.5  Smokeless Tobacco Never Used  Tobacco Comment   ONE CIG A DAY        Objective:    BP Readings from Last 3 Encounters:  09/03/19 118/70  08/10/19 (!) 164/83  07/15/19 (!) 153/80   Wt Readings from Last 3 Encounters:  09/03/19 166 lb (75.3 kg)  07/15/19 160 lb (72.6 kg)  07/13/19 162 lb (73.5 kg)    BP 118/70   Pulse 71   Ht 5' 3.5" (1.613 m)   Wt 166 lb (75.3 kg)   LMP 07/22/2015   SpO2 97%   BMI 28.94 kg/m    Physical Exam Constitutional:      General: She is not in acute distress.    Appearance: She is well-developed. She is not diaphoretic.  HENT:     Right Ear: External ear normal.     Left Ear: External ear normal.  Eyes:     Conjunctiva/sclera: Conjunctivae normal.  Cardiovascular:     Rate and Rhythm: Normal rate and regular rhythm.     Heart sounds: Murmur heard.   Pulmonary:     Effort: Pulmonary effort is normal. No respiratory distress.     Breath sounds: Normal breath sounds. No wheezing.  Musculoskeletal:     Cervical back: Neck supple.  Skin:    General: Skin is warm and dry.     Capillary Refill: Capillary refill takes less than 2 seconds.  Neurological:     Mental Status: She is alert. Mental status is at baseline.  Psychiatric:        Mood and Affect: Mood normal.        Behavior: Behavior  normal.           Assessment & Plan:   Problem List Items Addressed This Visit      Cardiovascular and Mediastinum   Atrial fibrillation (HCC) - Primary    Taking metoprolol 25 AM and 50 PM. Not on blood thinner due to GI bleed with ischemic colitis      HTN (hypertension)    Stable on losartan and amlodipine        Respiratory   OSA (obstructive sleep apnea)    Follows with Dr. Elsworth Soho. Using CPAP at night        Digestive   Colitis, ischemic (HCC)    Contraindication to blood thinner for the afib. Feels healthy and doing well. No recent bleeds. Taking iron        Other   History of breast cancer    Discussed could stop mammograms when she would not want treatment. At this point she will continue yearly screening.       History of heart attack    On statin and asa. Follows with cardiology  Prediabetes    Lab Results  Component Value Date   HGBA1C 6.1 (H) 04/09/2012   From several years ago. CBGs are slightly elevated but still fairly low. Will recheck today. Already following healthy diet and exercising. Follow-up based on results      Relevant Orders   Hemoglobin A1c   Iron deficiency anemia    Slightly low at recent ER visit. Will repeat. Follows with GI due to hx of bleed. Continue iron      Relevant Orders   CBC       Return in about 1 year (around 09/02/2020) for wellness visit.  Lesleigh Noe, MD  This visit occurred during the SARS-CoV-2 public health emergency.  Safety protocols were in place, including screening questions prior to the visit, additional usage of staff PPE, and extensive cleaning of exam room while observing appropriate contact time as indicated for disinfecting solutions.

## 2019-09-09 DIAGNOSIS — M25562 Pain in left knee: Secondary | ICD-10-CM | POA: Diagnosis not present

## 2019-09-09 DIAGNOSIS — M25561 Pain in right knee: Secondary | ICD-10-CM | POA: Diagnosis not present

## 2019-09-11 DIAGNOSIS — I8312 Varicose veins of left lower extremity with inflammation: Secondary | ICD-10-CM | POA: Diagnosis not present

## 2019-09-11 DIAGNOSIS — I8311 Varicose veins of right lower extremity with inflammation: Secondary | ICD-10-CM | POA: Diagnosis not present

## 2019-09-19 ENCOUNTER — Emergency Department: Admit: 2019-09-19 | Payer: Medicare (Managed Care)

## 2019-09-19 ENCOUNTER — Inpatient Hospital Stay
Admit: 2019-09-19 | Discharge: 2019-09-20 | Disposition: A | Discharge status: Home / Self Care | Payer: Medicare (Managed Care) | Attending: Emergency Medicine

## 2019-09-19 DIAGNOSIS — R079 Chest pain, unspecified: Secondary | ICD-10-CM

## 2019-09-19 DIAGNOSIS — I10 Essential (primary) hypertension: Secondary | ICD-10-CM | POA: Diagnosis not present

## 2019-09-19 DIAGNOSIS — R112 Nausea with vomiting, unspecified: Secondary | ICD-10-CM | POA: Diagnosis not present

## 2019-09-19 DIAGNOSIS — R1110 Vomiting, unspecified: Secondary | ICD-10-CM | POA: Diagnosis not present

## 2019-09-19 DIAGNOSIS — R109 Unspecified abdominal pain: Secondary | ICD-10-CM | POA: Diagnosis not present

## 2019-09-19 DIAGNOSIS — K449 Diaphragmatic hernia without obstruction or gangrene: Secondary | ICD-10-CM | POA: Diagnosis not present

## 2019-09-19 DIAGNOSIS — R001 Bradycardia, unspecified: Secondary | ICD-10-CM | POA: Diagnosis not present

## 2019-09-19 DIAGNOSIS — I252 Old myocardial infarction: Secondary | ICD-10-CM | POA: Diagnosis not present

## 2019-09-19 LAB — CBC WITH AUTOMATED DIFF
ABS. BASOPHILS: 0 10*3/uL (ref 0.0–0.2)
ABS. EOSINOPHILS: 0.1 10*3/uL (ref 0.0–0.7)
ABS. LYMPHOCYTES: 1.9 10*3/uL (ref 1.0–4.8)
ABS. MONOCYTES: 0.7 10*3/uL (ref 0.2–2.4)
ABS. NEUTROPHILS: 3.8 10*3/uL (ref 1.8–7.7)
ABSOLUTE NRBC: 0 10*3/uL
BASOPHILS: 1 % (ref 0.0–2.5)
EOSINOPHILS: 2 % (ref 0.9–2.9)
HCT: 34.1 % — ABNORMAL LOW (ref 36–46)
HGB: 11.7 g/dL — ABNORMAL LOW (ref 13.5–17.5)
LYMPHOCYTES: 29 % (ref 20.5–51.1)
MCH: 32.9 PG (ref 31–34)
MCHC: 34.3 g/dL (ref 31.0–36.0)
MCV: 95.8 FL (ref 80–100)
MONOCYTES: 11 % — ABNORMAL HIGH (ref 1.7–9.3)
MPV: 7.5 FL (ref 6.5–11.5)
NEUTROPHILS: 57 % (ref 42–75)
NRBC: 0 PER 100 WBC
PLATELET: 218 10*3/uL (ref 150–400)
RBC: 3.56 M/uL — ABNORMAL LOW (ref 4.50–5.90)
RDW: 13.7 % (ref 11.5–14.5)
WBC: 6.5 10*3/uL (ref 4.4–11.3)

## 2019-09-19 LAB — METABOLIC PANEL, COMPREHENSIVE
A-G Ratio: 1.1 (ref 1.1–2.2)
ALT (SGPT): 17 U/L (ref 12–78)
AST (SGOT): 13 U/L — ABNORMAL LOW (ref 15–37)
Albumin: 3.8 g/dL (ref 3.5–5.0)
Alk. phosphatase: 51 U/L (ref 45–117)
Anion gap: 8 mmol/L (ref 5–15)
BUN/Creatinine ratio: 24 — ABNORMAL HIGH (ref 12–20)
BUN: 29 mg/dL — ABNORMAL HIGH (ref 6–20)
Bilirubin, total: 0.5 mg/dL (ref 0.2–1.0)
CO2: 27 mmol/L (ref 21–32)
Calcium: 8.7 mg/dL (ref 8.5–10.1)
Chloride: 108 mmol/L (ref 97–108)
Creatinine: 1.2 mg/dL — ABNORMAL HIGH (ref 0.55–1.02)
GFR est AA: 52 mL/min/{1.73_m2} — ABNORMAL LOW (ref >60)
GFR est non-AA: 43 mL/min/{1.73_m2} — ABNORMAL LOW (ref >60)
Globulin: 3.4 g/dL (ref 2.0–4.0)
Glucose: 138 mg/dL — ABNORMAL HIGH (ref 65–100)
Potassium: 3.5 mmol/L (ref 3.5–5.1)
Protein, total: 7.2 g/dL (ref 6.4–8.2)
Sodium: 143 mmol/L (ref 136–145)

## 2019-09-19 LAB — MAGNESIUM
Magnesium: 2.1 mg/dL (ref 1.6–2.4)
Magnesium: 2.1 mg/dL (ref 1.6–2.4)

## 2019-09-19 LAB — BNP: NT pro-BNP: 526 pg/mL — ABNORMAL HIGH (ref <450)

## 2019-09-19 LAB — TROPONIN I: Troponin-I, Qt.: <0.05 ng/mL (ref <0.05)

## 2019-09-19 LAB — PROTHROMBIN TIME + INR
INR: 1 (ref 0.9–1.1)
Prothrombin time: 10 s (ref 9.0–11.1)

## 2019-09-19 LAB — LIPASE
Lipase: 57 U/L — ABNORMAL LOW (ref 73–393)
Lipase: 57 U/L — ABNORMAL LOW (ref 73–393)

## 2019-09-19 LAB — COMPREHENSIVE METABOLIC PANEL
ALT: 17 U/L (ref 12–78)
AST: 13 U/L — ABNORMAL LOW (ref 15–37)
Albumin/Globulin Ratio: 1.1 (ref 1.1–2.2)
Albumin: 3.8 g/dL (ref 3.5–5.0)
Alkaline Phosphatase: 51 U/L (ref 45–117)
Anion Gap: 8 mmol/L (ref 5–15)
BUN: 29 mg/dL — ABNORMAL HIGH (ref 6–20)
Bun/Cre Ratio: 24 — ABNORMAL HIGH (ref 12–20)
CO2: 27 mmol/L (ref 21–32)
Calcium: 8.7 mg/dL (ref 8.5–10.1)
Chloride: 108 mmol/L (ref 97–108)
Creatinine: 1.2 mg/dL — ABNORMAL HIGH (ref 0.55–1.02)
EGFR IF NonAfrican American: 43 mL/min/{1.73_m2} — ABNORMAL LOW (ref >60)
GFR African American: 52 mL/min/{1.73_m2} — ABNORMAL LOW (ref >60)
Globulin: 3.4 g/dL (ref 2.0–4.0)
Glucose: 138 mg/dL — ABNORMAL HIGH (ref 65–100)
Potassium: 3.5 mmol/L (ref 3.5–5.1)
Sodium: 143 mmol/L (ref 136–145)
Total Bilirubin: 0.5 mg/dL (ref 0.2–1.0)
Total Protein: 7.2 g/dL (ref 6.4–8.2)

## 2019-09-19 LAB — CBC WITH AUTO DIFFERENTIAL
Basophils %: 1 % (ref 0.0–2.5)
Basophils Absolute: 0 10*3/uL (ref 0.0–0.2)
Eosinophils %: 2 % (ref 0.9–2.9)
Eosinophils Absolute: 0.1 10*3/uL (ref 0.0–0.7)
Hematocrit: 34.1 % — ABNORMAL LOW (ref 36–46)
Hemoglobin: 11.7 g/dL — ABNORMAL LOW (ref 13.5–17.5)
Lymphocytes %: 29 % (ref 20.5–51.1)
Lymphocytes Absolute: 1.9 10*3/uL (ref 1.0–4.8)
MCH: 32.9 PG (ref 31–34)
MCHC: 34.3 g/dL (ref 31.0–36.0)
MCV: 95.8 FL (ref 80–100)
MPV: 7.5 FL (ref 6.5–11.5)
Monocytes %: 11 % — ABNORMAL HIGH (ref 1.7–9.3)
Monocytes Absolute: 0.7 10*3/uL (ref 0.2–2.4)
NRBC Absolute: 0 10*3/uL
Neutrophils %: 57 % (ref 42–75)
Neutrophils Absolute: 3.8 10*3/uL (ref 1.8–7.7)
Nucleated RBCs: 0 PER 100 WBC
Platelets: 218 10*3/uL (ref 150–400)
RBC: 3.56 M/uL — ABNORMAL LOW (ref 4.50–5.90)
RDW: 13.7 % (ref 11.5–14.5)
WBC: 6.5 10*3/uL (ref 4.4–11.3)

## 2019-09-19 LAB — PROTIME-INR
INR: 1 (ref 0.9–1.1)
Protime: 10 s (ref 9.0–11.1)

## 2019-09-19 LAB — BRAIN NATRIURETIC PEPTIDE: BNP: 526 pg/mL — ABNORMAL HIGH (ref <450)

## 2019-09-19 LAB — TROPONIN: Troponin I: <0.05 ng/mL (ref <0.05)

## 2019-09-19 MED ORDER — SODIUM CHLORIDE 0.9% BOLUS IV
0.9 % | Freq: Once | INTRAVENOUS | Status: AC
Start: 2019-09-19 — End: 2019-09-19
  Administered 2019-09-19: 21:00:00 via INTRAVENOUS

## 2019-09-19 MED ORDER — LORAZEPAM 2 MG/ML IJ SOLN
2 mg/mL | INTRAMUSCULAR | Status: AC
Start: 2019-09-19 — End: 2019-09-19
  Administered 2019-09-19: 21:00:00 via INTRAVENOUS

## 2019-09-19 MED ORDER — SODIUM CHLORIDE 0.9 % INJECTION
20 mg/2 mL | INTRAMUSCULAR | Status: AC
Start: 2019-09-19 — End: 2019-09-19
  Administered 2019-09-19: 21:00:00 via INTRAVENOUS

## 2019-09-19 MED ORDER — SODIUM CHLORIDE 0.9% BOLUS IV
0.9 % | Freq: Once | INTRAVENOUS | Status: AC
Start: 2019-09-19 — End: 2019-09-19
  Administered 2019-09-19: 20:00:00 via INTRAVENOUS

## 2019-09-19 MED ORDER — ONDANSETRON (PF) 4 MG/2 ML INJECTION
4 mg/2 mL | Freq: Once | INTRAMUSCULAR | Status: AC
Start: 2019-09-19 — End: 2019-09-19
  Administered 2019-09-19: 20:00:00 via INTRAVENOUS

## 2019-09-19 MED ORDER — IOPAMIDOL 76 % IV SOLN
37076 mg iodine /mL (76 %) | Freq: Once | INTRAVENOUS | Status: AC
Start: 2019-09-19 — End: 2019-09-19
  Administered 2019-09-19: 22:00:00 via INTRAVENOUS

## 2019-09-19 MED FILL — LORAZEPAM 2 MG/ML IJ SOLN: 2 mg/mL | INTRAMUSCULAR | Qty: 1

## 2019-09-19 MED FILL — SODIUM CHLORIDE 0.9 % IV: INTRAVENOUS | Qty: 1000

## 2019-09-19 MED FILL — ISOVUE-370  76 % INTRAVENOUS SOLUTION: 370 mg iodine /mL (76 %) | INTRAVENOUS | Qty: 100

## 2019-09-19 MED FILL — FAMOTIDINE (PF) 20 MG/2 ML IV: 20 mg/2 mL | INTRAVENOUS | Qty: 2

## 2019-09-19 MED FILL — SODIUM CHLORIDE 0.9 % IV: INTRAVENOUS | Qty: 500

## 2019-09-19 MED FILL — ONDANSETRON (PF) 4 MG/2 ML INJECTION: 4 mg/2 mL | INTRAMUSCULAR | Qty: 2

## 2019-09-19 NOTE — ED Notes (Signed)
Patient was traveling through on [employer] and began to experience chest pain and nausea. Patient rated pain 10/10 and states it hurts in the center of her chest and in her upper back. Patient states she has hx of heart attack in 2016 and states she had catheterization.

## 2019-09-19 NOTE — ED Notes (Signed)
Report rcvd. Pt resting on stretcher; nad noted. Pt vss and documented. Pt awaiting ct results

## 2019-09-19 NOTE — ED Notes (Signed)
Pt d/c with instructions and taken to wr via wc with daughter. Pt states she feels better. Pt vss and document. No redness or swelling at iv sitex3. Pt to f/u with pcp upon arrival home.

## 2019-09-19 NOTE — ED Provider Notes (Signed)
ED Provider Notes by Domingo Dimes, MD at 09/19/19 1605                Author: Domingo Dimes, MD  Service: Emergency Medicine  Author Type: Physician       Filed: 09/19/19 2019  Date of Service: 09/19/19 1605  Status: Signed          Editor: Domingo Dimes, MD (Physician)               EMERGENCY DEPARTMENT HISTORY AND PHYSICAL EXAM           Date: (Not on file)   Patient Name: Krista Wilkins           History of Presenting Illness          Chief Complaint       Patient presents with        ?  Chest Pain           History Provided By: EMS      HPI: Graylin Shiver,  85 y.o. female with a past medical history significant  hypertension pain MI presents to the ED with cc of 9 out of 10 chest pain patient described as achy and stabbing radiating to her back with associated vomiting 3 episodes, patient received 2 sublingual  nitros 2 aspirins      There are no other complaints, changes, or physical findings at this time.      PCP: No primary care provider on file.        Current Facility-Administered Medications             Medication  Dose  Route  Frequency  Provider  Last Rate  Last Admin              ?  sodium chloride 0.9 % bolus infusion 1,000 mL   1,000 mL  IntraVENous  ONCE  Benn-Thompson, Dana Dorner, MD                    ?  ondansetron (ZOFRAN) injection 4 mg   4 mg  IntraVENous  ONCE  Benn-Thompson, Carold Eisner, MD                   Past History        Past Medical History:   No past medical history on file.      Past Surgical History:   No past surgical history on file.      Family History:   No family history on file.      Social History:     Social History          Tobacco Use         ?  Smoking status:  Not on file       Substance Use Topics         ?  Alcohol use:  Not on file         ?  Drug use:  Not on file           Allergies:     Allergies        Allergen  Reactions         ?  Codeine  Nausea and Vomiting                Review of Systems        Review of Systems     Constitutional: Negative for chills and fever.    HENT: Negative for rhinorrhea  and sore throat.     Eyes: Negative for pain and visual disturbance.    Respiratory: Negative for cough and shortness of breath.     Cardiovascular: Positive for chest pain. Negative for leg swelling.    Gastrointestinal: Positive for abdominal pain, nausea  and vomiting.    Genitourinary: Negative for dysuria and urgency.    Musculoskeletal: Positive for back pain. Negative for neck pain.    Skin: Negative for color change and pallor.    Neurological: Negative for weakness, numbness and headaches.    Psychiatric/Behavioral: Negative.             Physical Exam        Physical Exam   Vitals and nursing note reviewed.   Constitutional:        Appearance: She is well-developed.   HENT :       Head: Normocephalic.   Eyes :       Extraocular Movements: Extraocular movements intact.      Pupils: Pupils are equal, round, and reactive to light.   Cardiovascular:       Rate and Rhythm: Normal rate and regular rhythm.      Heart sounds: Normal heart sounds.    Pulmonary:       Effort: Pulmonary effort is normal.      Breath sounds: Normal breath sounds.   Abdominal :      Palpations: Abdomen is soft.      Tenderness: There is abdominal tenderness in the  epigastric area.      Comments: No pulsatile mass     Musculoskeletal:          General: Normal range of motion.      Cervical back: Normal range of motion and neck supple.      Right lower leg: No tenderness. No edema.      Left lower leg: No tenderness. No edema.    Skin:      General: Skin is warm and dry.      Capillary Refill: Capillary refill takes less than 2 seconds.    Neurological:       General: No focal deficit present.      Mental Status: She is alert and oriented to person, place, and time.    Psychiatric:         Mood and Affect: Mood normal.         Behavior: Behavior normal.               Lab and Diagnostic Study Results        Labs -         Recent Results (from the past 12  hour(s))     EKG, 12 LEAD, INITIAL          Collection Time: 09/19/19  3:56 PM         Result  Value  Ref Range            Ventricular Rate  50  BPM       Atrial Rate  50  BPM       P-R Interval  154  ms       QRS Duration  104  ms       Q-T Interval  488  ms       QTC Calculation (Bezet)  445  ms       Calculated P Axis  43  degrees       Calculated R Axis  42  degrees       Calculated T Axis  63  degrees       Diagnosis                 Sinus rhythm   Abnormal R-wave progression, early transition              Radiologic Studies -    @LASTXRRESULT @     CT Results   (Last 48 hours)          None                 CXR Results   (Last 48 hours)          None                    Medical Decision Making and ED Course     - I am the first and primary provider for this patient AND AM THE PRIMARY PROVIDER OF RECORD.      - I reviewed the vital signs, available nursing notes, past medical history, past surgical history, family history and social history.      - Initial assessment performed. The patients presenting problems have been discussed, and the staff are in agreement with the care plan formulated and outlined with them.  I have encouraged them to ask questions as they arise throughout their visit.      Vital Signs-Reviewed the patient's vital signs.      Patient Vitals for the past 12 hrs:           Pulse  Resp  BP  SpO2           09/19/19 1556  (!) 52  22  (!) 77/39  95 %           Records Reviewed: Nursing Notes      The patient presents with chest pain with a differential diagnosis of  ACS, arrhythmia, acute MI, pulmonary edema/CHF, aortic dissection and pnuemonia      ED Course:            ED Course as of Sep 18 2016       Sat Sep 19, 2019        2013  Patient states she is feeling better her daughter is at bedside patient opted to go home instead of being admitted for observation     [SB]              ED Course User Index   [SB] 2014, MD              Provider Notes (Medical Decision Making):        MDM                  Consultations:           Consultations: - NONE              Procedures and Critical Care           Performed by: Domingo Dimes, MD   PROCEDURES:   Procedures                CRITICAL CARE NOTE :   4:05 PM   Amount of Critical Care Time:  33(minutes)      IMPENDING DETERIORATION -Cardiovascular   ASSOCIATED RISK FACTORS - Hypotension   MANAGEMENT- Bedside Assessment   INTERPRETATION -  Blood Pressure   INTERVENTIONS - hemodynamic mngmt   CASE REVIEW -ED  physician   TREATMENT RESPONSE -Improved and Stable   PERFORMED BY - Self      NOTES   :   I have spent critical care time involved in lab review, consultations with specialist, family decision- making, bedside attention and documentation. This time excludes time spent in any separate billed  procedures.  During this entire length of time I was immediately available to the patient .      Domingo Dimes, MD              Disposition        Disposition: Condition stable and improved   DC- Adult Discharges: All of the diagnostic tests were reviewed and questions answered. Diagnosis, care plan and treatment options were discussed.  The patient understands the instructions and will follow up as directed. The patients results have been  reviewed with them.  They have been counseled regarding their diagnosis.  The patient verbally convey understanding and agreement of the signs, symptoms, diagnosis, treatment and prognosis and additionally agrees to follow up as recommended with their  PCP in 24 - 48 hours.  They also agree with the care-plan and convey that all of their questions have been answered.  I have also put together some discharge instructions for them that include: 1) educational information regarding their diagnosis, 2)  how to care for their diagnosis at home, as well a 3) list of reasons why they would want to return to the ED prior to their follow-up appointment, should their condition change.            Remove if not  discharged   DISCHARGE PLAN:   1. There are no discharge medications for this patient.      2.      Follow-up Information      None             3.  Return to ED if worse    4. There are no discharge medications for this patient.           Diagnosis        Clinical Impression: No diagnosis found.      Attestations:      Domingo Dimes, MD      Please note that this dictation was completed with Dragon, the computer voice recognition software.  Quite often unanticipated grammatical, syntax, homophones, and other interpretive errors are inadvertently  transcribed by the computer software.  Please disregard these errors.  Please excuse any errors that have escaped final proofreading.  Thank you.

## 2019-09-20 MED ORDER — ONDANSETRON 4 MG TAB, RAPID DISSOLVE
4 mg | ORAL_TABLET | Freq: Three times a day (TID) | ORAL | 0 refills | Status: AC | PRN
Start: 2019-09-20 — End: ?

## 2019-09-21 LAB — EKG, 12 LEAD, INITIAL
Atrial Rate: 50 {beats}/min
Calculated P Axis: 43 degrees
Calculated R Axis: 42 degrees
Calculated T Axis: 63 degrees
P-R Interval: 154 ms
Q-T Interval: 488 ms
QRS Duration: 104 ms
QTC Calculation (Bezet): 445 ms
Ventricular Rate: 50 {beats}/min

## 2019-09-21 LAB — EKG 12-LEAD
Atrial Rate: 50 {beats}/min
P Axis: 43 degrees
P-R Interval: 154 ms
Q-T Interval: 488 ms
QRS Duration: 104 ms
QTc Calculation (Bazett): 445 ms
R Axis: 42 degrees
T Axis: 63 degrees
Ventricular Rate: 50 {beats}/min

## 2019-09-23 ENCOUNTER — Other Ambulatory Visit: Payer: Self-pay

## 2019-09-23 ENCOUNTER — Encounter: Payer: Self-pay | Admitting: Obstetrics & Gynecology

## 2019-09-23 ENCOUNTER — Ambulatory Visit: Payer: PPO | Admitting: Obstetrics & Gynecology

## 2019-09-23 VITALS — BP 140/82

## 2019-09-23 DIAGNOSIS — M25561 Pain in right knee: Secondary | ICD-10-CM | POA: Diagnosis not present

## 2019-09-23 DIAGNOSIS — N83202 Unspecified ovarian cyst, left side: Secondary | ICD-10-CM

## 2019-09-23 NOTE — Progress Notes (Signed)
    Wanda Green November 09, 1934 [id number]        84 y.o.  [id number]   RP: Left ovarian cyst on CT scan  HPI: Seen at ER for Chest pain while driving on 3/41/9379.  Cardio/pulmonary investigation Negative.  CT scan of the pelvis done as part of the work-up incidentally showed a Lt Ovarian cyst 3.3 cm of simple benign appearance.  No pelvic pain.  No PMB.  Urine/BMs normal.   OB History  Gravida Para Term Preterm AB Living  3 3 3     2   SAB TAB Ectopic Multiple Live Births               # Outcome Date GA Lbr Len/2nd Weight Sex Delivery Anes PTL Lv  3 Term           2 Term           1 Term             Past medical history,surgical history, problem list, medications, allergies, family history and social history were all reviewed and documented in the EPIC chart.   Directed ROS with pertinent positives and negatives documented in the history of present illness/assessment and plan.  Exam:  Vitals:   09/23/19 1554  BP: 140/82   General appearance:  Normal  Abdomen: Normal  Gynecologic exam: Vulva normal.  Bimanual exam:  Uterus AV, normal volume, mobile, NT.  No adnexal mass felt, NT.  Pelvic US 09/10/2017: Ultrasound transvaginal shows uterus normal size and echotexture. Endometrial echo 2.8 mm. Right and left ovaries not identified. Right adnexa normal. Left adnexa with avascular cystic area 36 x 24 x 27 mm. No free fluid noted.   Assessment/Plan:  85 y.o. G3P3002   1. Left ovarian cyst Incidental finding of a benign appearing simple left ovarian cyst measured at 3.3 cm on CT scan September 19, 2019.  No pelvic pain.  Reviewing the chart, a pelvic ultrasound done September 10, 2017 was showing a left adnexal cyst measured at 3.6 cm.  Probably the same cyst now slightly smaller.  Patient reassured, but will nonetheless investigate with a pelvic ultrasound and Ca1 25. - CA 125 - US Transvaginal Non-OB; Future  Princess Bruins MD, 4:15 PM 09/23/2019

## 2019-09-24 LAB — CA 125: CA 125: 4 U/mL (ref <35)

## 2019-10-02 DIAGNOSIS — M6281 Muscle weakness (generalized): Secondary | ICD-10-CM | POA: Diagnosis not present

## 2019-10-02 DIAGNOSIS — M25661 Stiffness of right knee, not elsewhere classified: Secondary | ICD-10-CM | POA: Diagnosis not present

## 2019-10-02 DIAGNOSIS — R262 Difficulty in walking, not elsewhere classified: Secondary | ICD-10-CM | POA: Diagnosis not present

## 2019-10-07 DIAGNOSIS — M25661 Stiffness of right knee, not elsewhere classified: Secondary | ICD-10-CM | POA: Diagnosis not present

## 2019-10-07 DIAGNOSIS — M6281 Muscle weakness (generalized): Secondary | ICD-10-CM | POA: Diagnosis not present

## 2019-10-07 DIAGNOSIS — R262 Difficulty in walking, not elsewhere classified: Secondary | ICD-10-CM | POA: Diagnosis not present

## 2019-10-08 DIAGNOSIS — Z7982 Long term (current) use of aspirin: Secondary | ICD-10-CM | POA: Diagnosis not present

## 2019-10-08 DIAGNOSIS — Z96652 Presence of left artificial knee joint: Secondary | ICD-10-CM | POA: Diagnosis not present

## 2019-10-08 DIAGNOSIS — I8310 Varicose veins of unspecified lower extremity with inflammation: Secondary | ICD-10-CM | POA: Diagnosis not present

## 2019-10-08 DIAGNOSIS — I771 Stricture of artery: Secondary | ICD-10-CM | POA: Insufficient documentation

## 2019-10-08 DIAGNOSIS — Z8719 Personal history of other diseases of the digestive system: Secondary | ICD-10-CM | POA: Diagnosis not present

## 2019-10-08 DIAGNOSIS — Z853 Personal history of malignant neoplasm of breast: Secondary | ICD-10-CM | POA: Diagnosis not present

## 2019-10-08 DIAGNOSIS — I774 Celiac artery compression syndrome: Secondary | ICD-10-CM | POA: Diagnosis not present

## 2019-10-08 DIAGNOSIS — I872 Venous insufficiency (chronic) (peripheral): Secondary | ICD-10-CM | POA: Diagnosis not present

## 2019-10-12 DIAGNOSIS — I872 Venous insufficiency (chronic) (peripheral): Secondary | ICD-10-CM | POA: Insufficient documentation

## 2019-10-15 ENCOUNTER — Telehealth: Payer: Self-pay | Admitting: Pulmonary Disease

## 2019-10-15 NOTE — Telephone Encounter (Signed)
Faxed last OV notes to [facility] w/ [facility] & received fax confirmation.

## 2019-10-15 NOTE — Telephone Encounter (Signed)
Spoke with Maudie Mercury ([facility]), she states that they need the most recent OV notes faxed to them for insurance authorization for CPAP supplies.  The fax # is [phone], you can put attention Kim.

## 2019-10-19 DIAGNOSIS — G4733 Obstructive sleep apnea (adult) (pediatric): Secondary | ICD-10-CM | POA: Diagnosis not present

## 2019-10-29 DIAGNOSIS — D509 Iron deficiency anemia, unspecified: Secondary | ICD-10-CM | POA: Diagnosis not present

## 2019-11-05 ENCOUNTER — Other Ambulatory Visit: Payer: Self-pay

## 2019-11-05 ENCOUNTER — Ambulatory Visit (INDEPENDENT_AMBULATORY_CARE_PROVIDER_SITE_OTHER): Payer: PPO

## 2019-11-05 ENCOUNTER — Encounter: Payer: Self-pay | Admitting: Obstetrics & Gynecology

## 2019-11-05 ENCOUNTER — Ambulatory Visit: Payer: PPO | Admitting: Obstetrics & Gynecology

## 2019-11-05 DIAGNOSIS — N854 Malposition of uterus: Secondary | ICD-10-CM

## 2019-11-05 DIAGNOSIS — N83202 Unspecified ovarian cyst, left side: Secondary | ICD-10-CM

## 2019-11-05 NOTE — Progress Notes (Signed)
    Wanda Green 02-Aug-1934 [id number]        85 y.o.  G3P3L2 Son died 1 yr ago.  RP: Left Ovarian Cyst for Pelvic US  HPI: Asymptomatic left ovarian cyst last evaluated by ultrasound in July 2019.   OB History  Gravida Para Term Preterm AB Living  3 3 3     2   SAB TAB Ectopic Multiple Live Births               # Outcome Date GA Lbr Len/2nd Weight Sex Delivery Anes PTL Lv  3 Term           2 Term           1 Term             Past medical history,surgical history, problem list, medications, allergies, family history and social history were all reviewed and documented in the EPIC chart.   Directed ROS with pertinent positives and negatives documented in the history of present illness/assessment and plan.  Exam:  There were no vitals filed for this visit. General appearance:  Normal  Pelvic US today: T/V images.  Anteverted uterus, atrophic with no myometrial mass.  The uterus is measured at 5.16 x 4.01 x 2.62 cm.  The endometrial lining is thin and symmetrical with no mass or thickening seen.  The endometrial lining is measured at 1.65 mm.  Right ovary atrophic.  Left ovary with a smaller simple cyst compared with ultrasound in 09/2017.  The cyst is measured at 3.22 x 2.56 cm, it is avascular and echo-free with smooth margins.  No adnexal mass.  No free fluid in the posterior cul-de-sac.    Assessment/Plan:  85 y.o. G3P3002   1. Left ovarian cyst Asymptomatic left ovarian cyst.  Pelvic ultrasound findings thoroughly reviewed with patient.  The left simple ovarian cyst is smaller than it was on the pelvic ultrasound in July 2019.  It is measured today at 3.22 x 2.56 cm and it is avascular and echofree with smooth margins.  No free fluid in the posterior cul-de-sac.  Patient was reassured about the benign findings.  We will follow-up for annual exam in December 2021.  Princess Bruins MD, 2:29 PM 11/05/2019

## 2019-11-13 DIAGNOSIS — I10 Essential (primary) hypertension: Secondary | ICD-10-CM | POA: Diagnosis not present

## 2019-11-19 ENCOUNTER — Telehealth (INDEPENDENT_AMBULATORY_CARE_PROVIDER_SITE_OTHER): Payer: PPO | Admitting: Family Medicine

## 2019-11-19 ENCOUNTER — Other Ambulatory Visit: Payer: Self-pay

## 2019-11-19 VITALS — BP 140/70 | Wt 160.0 lb

## 2019-11-19 DIAGNOSIS — D509 Iron deficiency anemia, unspecified: Secondary | ICD-10-CM | POA: Diagnosis not present

## 2019-11-19 DIAGNOSIS — J0100 Acute maxillary sinusitis, unspecified: Secondary | ICD-10-CM

## 2019-11-19 MED ORDER — DOXYCYCLINE HYCLATE 100 MG PO TABS: 100.0000 mg | ORAL_TABLET | Freq: Two times a day (BID) | ORAL | 0 refills | Status: AC

## 2019-11-19 NOTE — Progress Notes (Signed)
I connected with Wanda Green on 11/19/19 at 10:20 AM EDT by video and verified that I am speaking with the correct person using two identifiers.   I discussed the limitations, risks, security and privacy concerns of performing an evaluation and management service by video and the availability of in person appointments. I also discussed with the patient that there may be a patient responsible charge related to this service. The patient expressed understanding and agreed to proceed.  Patient location: Home Provider Location: [place] Participants: Lesleigh Noe and Wanda Green   Subjective:     Wanda Green is a 85 y.o. female presenting for Nasal Congestion (x 1 month) and Headache     Headache  This is a new problem. The current episode started more than 1 month ago. The problem occurs constantly. Associated symptoms include sinus pressure. Pertinent negatives include no coughing, eye redness, fever, nausea, sore throat or vomiting.   Treatment: omeprazole and zyrtec w/o improvement also taking muscinex   Review of Systems  Constitutional: Negative for chills and fever.  HENT: Positive for congestion, postnasal drip, sinus pressure and sinus pain. Negative for dental problem, sneezing and sore throat.   Eyes: Negative for redness and itching.  Respiratory: Negative for cough and shortness of breath.   Gastrointestinal: Negative for nausea and vomiting.  Neurological: Positive for headaches.     Social History   Tobacco Use  Smoking Status Former Smoker  . Packs/day: 0.10  . Years: 3.00  . Pack years: 0.30  . Types: Cigarettes  . Quit date: 03/05/1972  . Years since quitting: 47.7  Smokeless Tobacco Never Used  Tobacco Comment   ONE CIG A DAY        Objective:   BP Readings from Last 3 Encounters:  11/19/19 140/70  09/23/19 140/82  09/03/19 118/70   Wt Readings from Last 3 Encounters:  11/19/19 160 lb (72.6 kg)  09/03/19 166 lb  (75.3 kg)  07/15/19 160 lb (72.6 kg)   BP 140/70   Wt 160 lb (72.6 kg)   LMP 07/22/2015   BMI 27.90 kg/m    Physical Exam Constitutional:      Appearance: Normal appearance. She is not ill-appearing.  HENT:     Head: Normocephalic and atraumatic.     Right Ear: External ear normal.     Left Ear: External ear normal.  Eyes:     Conjunctiva/sclera: Conjunctivae normal.  Pulmonary:     Effort: Pulmonary effort is normal. No respiratory distress.  Neurological:     Mental Status: She is alert. Mental status is at baseline.  Psychiatric:        Mood and Affect: Mood normal.        Behavior: Behavior normal.        Thought Content: Thought content normal.        Judgment: Judgment normal.             Assessment & Plan:   Problem List Items Addressed This Visit      Other   Iron deficiency anemia    Reviewed labs with stable 11.5. normal is 12. Prior GI work-up unremarkable. Cont iron supplement. Will repeat in 6 months.        Other Visit Diagnoses    Acute non-recurrent maxillary sinusitis    -  Primary   Relevant Medications   cetirizine (ZYRTEC) 10 MG tablet   guaiFENesin (MUCINEX) 600 MG 12 hr tablet  doxycycline (VIBRA-TABS) 100 MG tablet     Advised trial of claritin and saline rinse Abx if not improving Return in 1 week if not resolved after abx  Return in about 6 months (around 05/18/2020) for For medicare wellness.  Lesleigh Noe, MD

## 2019-11-19 NOTE — Patient Instructions (Signed)
Try switching to Claritin for allergies  Doxycycline - if no improvement after 7 days return to clinic  Would recommend saline rinse twice daily

## 2019-11-19 NOTE — Assessment & Plan Note (Signed)
Reviewed labs with stable 11.5. normal is 12. Prior GI work-up unremarkable. Cont iron supplement. Will repeat in 6 months.

## 2019-11-30 DIAGNOSIS — E785 Hyperlipidemia, unspecified: Secondary | ICD-10-CM | POA: Diagnosis not present

## 2019-11-30 DIAGNOSIS — D649 Anemia, unspecified: Secondary | ICD-10-CM | POA: Diagnosis not present

## 2019-11-30 DIAGNOSIS — I10 Essential (primary) hypertension: Secondary | ICD-10-CM | POA: Diagnosis not present

## 2019-12-09 DIAGNOSIS — I774 Celiac artery compression syndrome: Secondary | ICD-10-CM | POA: Diagnosis not present

## 2019-12-09 DIAGNOSIS — I872 Venous insufficiency (chronic) (peripheral): Secondary | ICD-10-CM | POA: Diagnosis not present

## 2019-12-10 DIAGNOSIS — R198 Other specified symptoms and signs involving the digestive system and abdomen: Secondary | ICD-10-CM | POA: Diagnosis not present

## 2019-12-10 DIAGNOSIS — R1032 Left lower quadrant pain: Secondary | ICD-10-CM | POA: Diagnosis not present

## 2019-12-14 DIAGNOSIS — R21 Rash and other nonspecific skin eruption: Secondary | ICD-10-CM | POA: Diagnosis not present

## 2019-12-14 DIAGNOSIS — Z2089 Contact with and (suspected) exposure to other communicable diseases: Secondary | ICD-10-CM | POA: Diagnosis not present

## 2019-12-14 DIAGNOSIS — L299 Pruritus, unspecified: Secondary | ICD-10-CM | POA: Diagnosis not present

## 2019-12-15 ENCOUNTER — Telehealth: Payer: Self-pay | Admitting: Family Medicine

## 2019-12-15 NOTE — Telephone Encounter (Signed)
When form is ready for pick up, please call Sharyn Lull.

## 2019-12-15 NOTE — Telephone Encounter (Signed)
Patient's daughter, Sharyn Lull, dropped off Medical Evaluation form for [place].  Form is in the rx tower.

## 2019-12-17 NOTE — Telephone Encounter (Signed)
Spoke to pt's daughter, Sharyn Lull and let her know form is ready for pickup. She will pick up when her form is ready as well.

## 2019-12-22 ENCOUNTER — Other Ambulatory Visit: Payer: Self-pay | Admitting: Obstetrics and Gynecology

## 2019-12-28 ENCOUNTER — Encounter: Payer: Self-pay | Admitting: Gynecology

## 2019-12-28 DIAGNOSIS — Z1231 Encounter for screening mammogram for malignant neoplasm of breast: Secondary | ICD-10-CM | POA: Diagnosis not present

## 2020-01-11 ENCOUNTER — Telehealth: Payer: PPO | Admitting: Family Medicine

## 2020-01-11 ENCOUNTER — Telehealth: Payer: Self-pay

## 2020-01-11 ENCOUNTER — Telehealth: Payer: Self-pay | Admitting: Family Medicine

## 2020-01-11 ENCOUNTER — Other Ambulatory Visit: Payer: Self-pay

## 2020-01-11 DIAGNOSIS — R300 Dysuria: Secondary | ICD-10-CM

## 2020-01-11 NOTE — Progress Notes (Signed)
I have called available numbers to get in contact with patient.  I sent text for evisit.  I LMOVM for patient to call back.   Visit not completed despite mult attempts.  No charge for visit.  Pt to be rescheduled.

## 2020-01-11 NOTE — Telephone Encounter (Signed)
I called patient numerous times on both mobile and home number.... a child answered home phone and told me that pt has not been home for hours... I have UA and culture for visit with Dr Glori Bickers tomorrow

## 2020-01-11 NOTE — Telephone Encounter (Signed)
I spoke with pt who has burning, pain, and frequency or urine and urine has foul odor. No covid symptoms noted. Pt scheduled in office appt with Dr Damita Dunnings today at 12:30 pm and pt will be at office to ck in at 12:15.

## 2020-01-11 NOTE — Telephone Encounter (Signed)
Thanks. Noted.

## 2020-01-11 NOTE — Telephone Encounter (Signed)
Please see if you can get up with patient this afternoon.  See what details you can get.  Thanks.

## 2020-01-11 NOTE — Telephone Encounter (Signed)
[facility] Night - Client Nonclinical Telephone Record [facility]urse Client [name] Contact Type Call Who Is Calling Patient / Member / Family / Caregiver Caller Name [place] Phone Number [phone] Patient Name Wanda Green Patient DOB 1934-12-03 Call Type Message Only Information Provided Reason for Call Request to Schedule Office Appointment Initial Comment Caller said she has a UTI which includes frequency, pressure, and burning. Caller wants to know if she can leave a sample. Disp. Time Disposition Final User 01/11/2020 8:00:10 AM General Information Provided Yes Sundra Aland Call Closed By: Sundra Aland Transaction Date/Time: 01/11/2020 7:57:19 AM (ET)

## 2020-01-12 ENCOUNTER — Ambulatory Visit (INDEPENDENT_AMBULATORY_CARE_PROVIDER_SITE_OTHER): Payer: PPO | Admitting: Family Medicine

## 2020-01-12 ENCOUNTER — Other Ambulatory Visit: Payer: Self-pay

## 2020-01-12 ENCOUNTER — Encounter: Payer: Self-pay | Admitting: Family Medicine

## 2020-01-12 VITALS — BP 146/78 | HR 58 | Temp 97.3°F | Ht 63.5 in | Wt 165.6 lb

## 2020-01-12 DIAGNOSIS — N3000 Acute cystitis without hematuria: Secondary | ICD-10-CM | POA: Diagnosis not present

## 2020-01-12 DIAGNOSIS — R300 Dysuria: Secondary | ICD-10-CM | POA: Diagnosis not present

## 2020-01-12 LAB — POC URINALSYSI DIPSTICK (AUTOMATED)
Bilirubin, UA: NEGATIVE
Blood, UA: 200
Glucose, UA: NEGATIVE
Ketones, UA: NEGATIVE
Nitrite, UA: POSITIVE
Protein, UA: POSITIVE — AB
Spec Grav, UA: 1.025 (ref 1.010–1.025)
Urobilinogen, UA: 0.2 E.U./dL
pH, UA: 6 (ref 5.0–8.0)

## 2020-01-12 MED ORDER — SULFAMETHOXAZOLE-TRIMETHOPRIM 800-160 MG PO TABS: 1.0000 | ORAL_TABLET | Freq: Two times a day (BID) | ORAL | 0 refills | Status: DC

## 2020-01-12 NOTE — Patient Instructions (Addendum)
Drink lots of water  Take the bactrim ds as directed  If symptoms suddenly worsen -please let us know We will contact you when the urine culture comes back     Urinary Tract Infection, Adult  A urinary tract infection (UTI) is an infection of any part of the urinary tract. The urinary tract includes the kidneys, ureters, bladder, and urethra. These organs make, store, and get rid of urine in the body. Your health care provider may use other names to describe the infection. An upper UTI affects the ureters and kidneys (pyelonephritis). A lower UTI affects the bladder (cystitis) and urethra (urethritis). What are the causes? Most urinary tract infections are caused by bacteria in your genital area, around the entrance to your urinary tract (urethra). These bacteria grow and cause inflammation of your urinary tract. What increases the risk? You are more likely to develop this condition if:  You have a urinary catheter that stays in place (indwelling).  You are not able to control when you urinate or have a bowel movement (you have incontinence).  You are female and you: ? Use a spermicide or diaphragm for birth control. ? Have low estrogen levels. ? Are pregnant.  You have certain genes that increase your risk (genetics).  You are sexually active.  You take antibiotic medicines.  You have a condition that causes your flow of urine to slow down, such as: ? An enlarged prostate, if you are female. ? Blockage in your urethra (stricture). ? A kidney stone. ? A nerve condition that affects your bladder control (neurogenic bladder). ? Not getting enough to drink, or not urinating often.  You have certain medical conditions, such as: ? Diabetes. ? A weak disease-fighting system (immunesystem). ? Sickle cell disease. ? Gout. ? Spinal cord injury. What are the signs or symptoms? Symptoms of this condition include:  Needing to urinate right away (urgently).  Frequent urination or  passing small amounts of urine frequently.  Pain or burning with urination.  Blood in the urine.  Urine that smells bad or unusual.  Trouble urinating.  Cloudy urine.  Vaginal discharge, if you are female.  Pain in the abdomen or the lower back. You may also have:  Vomiting or a decreased appetite.  Confusion.  Irritability or tiredness.  A fever.  Diarrhea. The first symptom in older adults may be confusion. In some cases, they may not have any symptoms until the infection has worsened. How is this diagnosed? This condition is diagnosed based on your medical history and a physical exam. You may also have other tests, including:  Urine tests.  Blood tests.  Tests for sexually transmitted infections (STIs). If you have had more than one UTI, a cystoscopy or imaging studies may be done to determine the cause of the infections. How is this treated? Treatment for this condition includes:  Antibiotic medicine.  Over-the-counter medicines to treat discomfort.  Drinking enough water to stay hydrated. If you have frequent infections or have other conditions such as a kidney stone, you may need to see a health care provider who specializes in the urinary tract (urologist). In rare cases, urinary tract infections can cause sepsis. Sepsis is a life-threatening condition that occurs when the body responds to an infection. Sepsis is treated in the hospital with IV antibiotics, fluids, and other medicines. Follow these instructions at home:  Medicines  Take over-the-counter and prescription medicines only as told by your health care provider.  If you were prescribed an antibiotic medicine,  take it as told by your health care provider. Do not stop using the antibiotic even if you start to feel better. General instructions  Make sure you: ? Empty your bladder often and completely. Do not hold urine for long periods of time. ? Empty your bladder after sex. ? Wipe from front  to back after a bowel movement if you are female. Use each tissue one time when you wipe.  Drink enough fluid to keep your urine pale yellow.  Keep all follow-up visits as told by your health care provider. This is important. Contact a health care provider if:  Your symptoms do not get better after 1-2 days.  Your symptoms go away and then return. Get help right away if you have:  Severe pain in your back or your lower abdomen.  A fever.  Nausea or vomiting. Summary  A urinary tract infection (UTI) is an infection of any part of the urinary tract, which includes the kidneys, ureters, bladder, and urethra.  Most urinary tract infections are caused by bacteria in your genital area, around the entrance to your urinary tract (urethra).  Treatment for this condition often includes antibiotic medicines.  If you were prescribed an antibiotic medicine, take it as told by your health care provider. Do not stop using the antibiotic even if you start to feel better.  Keep all follow-up visits as told by your health care provider. This is important. This information is not intended to replace advice given to you by your health care provider. Make sure you discuss any questions you have with your health care provider. Document Revised: 02/06/2018 Document Reviewed: 08/29/2017 Elsevier Patient Education  2020 [employer].

## 2020-01-12 NOTE — Assessment & Plan Note (Signed)
With positive ua and voiding symptoms  Will cover with bactrim DS Enc fluid intake Handout given  inst to call if symptoms worsen Culture pending-will update and change tx if needed

## 2020-01-12 NOTE — Progress Notes (Signed)
Subjective:    Patient ID: Wanda Green, female    DOB: May 04, 1934, 85 y.o.   MRN: [id number]  This visit occurred during the SARS-CoV-2 public health emergency.  Safety protocols were in place, including screening questions prior to the visit, additional usage of staff PPE, and extensive cleaning of exam room while observing appropriate contact time as indicated for disinfecting solutions.    HPI 85 yo pt of Dr Einar Pheasant presents with urinary symptoms   Wt Readings from Last 3 Encounters:  01/12/20 165 lb 9 oz (75.1 kg)  11/19/19 160 lb (72.6 kg)  09/03/19 166 lb (75.3 kg)   28.87 kg/m   Symptoms started on Friday- had to urinate more often (she takes ditropan for overactive bladder)   Frequency of urination with bladder pressure  Dysuria -throbbing pain  No visible blood  No flank pain   No fever  No nausea   Last uti was several years ago  Has to wear a pad for incontinence  UA Results for orders placed or performed in visit on 01/12/20  POCT Urinalysis Dipstick (Automated)  Result Value Ref Range   Color, UA Yellow    Clarity, UA Cloudy    Glucose, UA Negative Negative   Bilirubin, UA Negative    Ketones, UA Negative    Spec Grav, UA 1.025 1.010 - 1.025   Blood, UA 200 Ery/uL    pH, UA 6.0 5.0 - 8.0   Protein, UA Positive (A) Negative   Urobilinogen, UA 0.2 0.2 or 1.0 E.U./dL   Nitrite, UA Positive    Leukocytes, UA Large (3+) (A) Negative   urine has an odor   Patient Active Problem List   Diagnosis Date Noted   Acute cystitis 01/12/2020   Prediabetes 09/03/2019   Iron deficiency anemia 09/03/2019   Primary osteoarthritis of knee 04/09/2017   Colitis, ischemic ([place]) 03/18/2017   Primary osteoarthritis of left knee 01/28/2017   History of heart attack 11/24/2014   Colitis 08/31/2014   Acute colitis 08/31/2014   Obesity (BMI 30.0-34.9) 03/02/2013   OSA (obstructive sleep apnea) 08/12/2012   Atrial fibrillation ([place]) 06/24/2012   HTN  (hypertension) 06/24/2012   Other and unspecified hyperlipidemia 06/24/2012   Allergic rhinitis due to pollen 06/24/2012   Hay fever 06/24/2012   History of breast cancer 06/05/2011   Past Medical History:  Diagnosis Date   Anemia    Arthritis    "knees" (04/09/2017)   Breast cancer, right breast ([place]) 2000   Cervical dysplasia age 34   Claustrophobia    "SEVERE"   Colitis, ischemic ([place])    Complication of anesthesia    Dysrhythmia    hx of a-fib (when her colitis flared up 2016)  back in rhythym   Elevated cholesterol    GERD (gastroesophageal reflux disease)    takes prilosec as needed   Heart attack ([place]) 2016   "mild one";  Dr Terrence Dupont   Heart murmur    Hypertension    OSA on CPAP    tested  2014   Osteoporosis 06/2016   T score -3.0   PONV (postoperative nausea and vomiting)    Past Surgical History:  Procedure Laterality Date   BREAST BIOPSY Right 2000   BREAST LUMPECTOMY Right 2000   CARDIAC CATHETERIZATION N/A 11/25/2014   Procedure: Left Heart Cath and Coronary Angiography;  Surgeon: Dixie Dials, MD;  Location: [facility];  Service: Cardiovascular;  Laterality: N/A;   CATARACT EXTRACTION W/ INTRAOCULAR LENS  IMPLANT, BILATERAL Bilateral 2018   CATARACT EXTRACTION, BILATERAL     CERVICAL CONE BIOPSY  age 85   COLONOSCOPY WITH PROPOFOL N/A 09/01/2014   Procedure: COLONOSCOPY WITH PROPOFOL;  Surgeon: Ronald Lobo, MD;  Location[facility];  Service: Endoscopy;  Laterality: N/A;  this will be done unprepped   COLPOSCOPY     FRACTURE SURGERY     JOINT REPLACEMENT     KNEE ARTHROSCOPY Bilateral    NASAL SEPTUM SURGERY  12/14   TOTAL KNEE ARTHROPLASTY Left 04/09/2017   TOTAL KNEE ARTHROPLASTY Left 04/09/2017   Procedure: LEFT TOTAL KNEE ARTHROPLASTY;  Surgeon: [name], MD;  Location: [facility];  Service: Orthopedics;  Laterality: Left;   TUBAL LIGATION     WRIST FRACTURE SURGERY Left ~ 2006   Social History    Tobacco Use   Smoking status: Former Smoker    Packs/day: 0.10    Years: 3.00    Pack years: 0.30    Types: Cigarettes    Quit date: 03/05/1972    Years since quitting: 47.8   Smokeless tobacco: Never Used   Tobacco comment: ONE CIG A DAY  Vaping Use   Vaping Use: Never used  Substance Use Topics   Alcohol use: No    Alcohol/week: 0.0 standard drinks   Drug use: No   Family History  Problem Relation Age of Onset   Hypertension Mother    Heart failure Mother    Heart disease Mother    Hypertension Father    Heart disease Father    Hypertension Sister    Uterine cancer Sister    Hypertension Brother    Heart disease Brother    Allergies  Allergen Reactions   Penicillins Nausea And Vomiting and Other (See Comments)    Caused bleeding also PATIENT HAS HAD A PCN REACTION WITH IMMEDIATE RASH, FACIAL/TONGUE/THROAT SWELLING, SOB, OR LIGHTHEADEDNESS WITH HYPOTENSION:  #  #  #  YES  #  #  #   Has patient had a PCN reaction causing severe rash involving mucus membranes or skin necrosis: No Has patient had a PCN reaction that required hospitalization No Has patient had a PCN reaction occurring within the last 10 years: #  #  #  YES  #  #  # .    Atorvastatin Other (See Comments)    Muscle and leg myalgias   Augmentin [Amoxicillin-Pot Clavulanate] Nausea And Vomiting and Other (See Comments)    Mouth pain   Codeine Nausea And Vomiting   Doxycycline Nausea And Vomiting   Hydrocodone Nausea And Vomiting   Oxycodone-Acetaminophen Nausea And Vomiting   Current Outpatient Medications on File Prior to Visit  Medication Sig Dispense Refill   amLODipine (NORVASC) 2.5 MG tablet Take 2.5 mg by mouth at bedtime.  3   aspirin 81 MG EC tablet Take 1 tablet (81 mg total) by mouth daily. After you have completed 5 day Rx of Apixiban (Eliquis) (Patient taking differently: Take 81 mg by mouth daily. ) 25 tablet 12   atorvastatin (LIPITOR) 20 MG tablet Take 10 mg by  mouth every Monday, Wednesday, and Friday.      cetirizine (ZYRTEC) 10 MG tablet Take by mouth.     ferrous sulfate 325 (65 FE) MG tablet Take 1 tablet (325 mg total) by mouth daily with breakfast. 30 tablet 1   guaiFENesin (MUCINEX) 600 MG 12 hr tablet Take by mouth.     losartan (COZAAR) 100 MG tablet Take 100 mg by mouth  daily.     metoprolol succinate (TOPROL-XL) 25 MG 24 hr tablet Take 25-50 mg by mouth See admin instructions. Take 25mg  in the morning and 50mg  at night.     omeprazole (PRILOSEC) 20 MG capsule Take 20 mg by mouth 2 (two) times daily.     oxybutynin (DITROPAN) 5 MG tablet TAKE 1 TABLET BY MOUTH THREE TIMES A DAY 270 tablet 3   No current facility-administered medications on file prior to visit.    Review of Systems  Constitutional: Positive for fatigue. Negative for activity change, appetite change and fever.  HENT: Negative for congestion and sore throat.   Eyes: Negative for itching and visual disturbance.  Respiratory: Negative for cough and shortness of breath.   Cardiovascular: Negative for leg swelling.  Gastrointestinal: Negative for abdominal distention, abdominal pain, constipation, diarrhea and nausea.  Endocrine: Negative for cold intolerance and polydipsia.  Genitourinary: Positive for dysuria, frequency and urgency. Negative for difficulty urinating, flank pain and hematuria.  Musculoskeletal: Negative for myalgias.  Skin: Negative for rash.  Allergic/Immunologic: Negative for immunocompromised state.  Neurological: Negative for dizziness and weakness.  Hematological: Negative for adenopathy.       Objective:   Physical Exam Constitutional:      General: She is not in acute distress.    Appearance: Normal appearance. She is well-developed and normal weight.  HENT:     Head: Normocephalic and atraumatic.  Eyes:     Conjunctiva/sclera: Conjunctivae normal.     Pupils: Pupils are equal, round, and reactive to light.  Cardiovascular:      Rate and Rhythm: Normal rate and regular rhythm.     Heart sounds: Normal heart sounds.  Pulmonary:     Effort: Pulmonary effort is normal.     Breath sounds: Normal breath sounds.  Abdominal:     General: Bowel sounds are normal. There is no distension.     Palpations: Abdomen is soft.     Tenderness: There is abdominal tenderness. There is no rebound.     Comments: No cva tenderness  Mild suprapubic tenderness  Musculoskeletal:     Cervical back: Normal range of motion and neck supple.  Lymphadenopathy:     Cervical: No cervical adenopathy.  Skin:    Coloration: Skin is not pale.     Findings: No rash.  Neurological:     Mental Status: She is alert.           Assessment & Plan:   Problem List Items Addressed This Visit      Genitourinary   Acute cystitis - Primary    With positive ua and voiding symptoms  Will cover with bactrim DS Enc fluid intake Handout given  inst to call if symptoms worsen Culture pending-will update and change tx if needed      Relevant Orders   Urine Culture    Other Visit Diagnoses    Dysuria       Relevant Orders   POCT Urinalysis Dipstick (Automated) (Completed)

## 2020-01-15 LAB — URINE CULTURE
MICRO NUMBER:: 11179751
SPECIMEN QUALITY:: ADEQUATE

## 2020-01-25 DIAGNOSIS — G4733 Obstructive sleep apnea (adult) (pediatric): Secondary | ICD-10-CM | POA: Diagnosis not present

## 2020-02-08 DIAGNOSIS — M25561 Pain in right knee: Secondary | ICD-10-CM | POA: Diagnosis not present

## 2020-02-11 ENCOUNTER — Telehealth: Payer: Self-pay | Admitting: Family Medicine

## 2020-02-11 NOTE — Telephone Encounter (Signed)
Called patient to schedule pre op appointment. LVM to call back as per DPR. Paperwork given to [place] to hold.

## 2020-02-17 DIAGNOSIS — R1032 Left lower quadrant pain: Secondary | ICD-10-CM | POA: Diagnosis not present

## 2020-02-17 DIAGNOSIS — R198 Other specified symptoms and signs involving the digestive system and abdomen: Secondary | ICD-10-CM | POA: Diagnosis not present

## 2020-03-01 ENCOUNTER — Encounter: Payer: Self-pay | Admitting: Family Medicine

## 2020-03-01 ENCOUNTER — Other Ambulatory Visit: Payer: Self-pay

## 2020-03-01 ENCOUNTER — Telehealth: Payer: Self-pay

## 2020-03-01 ENCOUNTER — Ambulatory Visit (INDEPENDENT_AMBULATORY_CARE_PROVIDER_SITE_OTHER): Payer: PPO | Admitting: Family Medicine

## 2020-03-01 VITALS — BP 130/70 | HR 66 | Temp 98.1°F | Ht 63.5 in | Wt 167.5 lb

## 2020-03-01 DIAGNOSIS — I10 Essential (primary) hypertension: Secondary | ICD-10-CM | POA: Diagnosis not present

## 2020-03-01 DIAGNOSIS — Z01818 Encounter for other preprocedural examination: Secondary | ICD-10-CM

## 2020-03-01 DIAGNOSIS — D509 Iron deficiency anemia, unspecified: Secondary | ICD-10-CM

## 2020-03-01 DIAGNOSIS — I4891 Unspecified atrial fibrillation: Secondary | ICD-10-CM | POA: Diagnosis not present

## 2020-03-01 NOTE — Progress Notes (Signed)
Subjective:    Wanda Green is a 85 y.o. female who presents to the office today for a preoperative consultation at the request of surgeon Edmonia Lynch who plans on performing Right Total Knee Replacement ASAP based on screening today.   This consultation is requested for the specific conditions prompting preoperative evaluation (i.e. because of potential affect on operative risk): A fib, HTN, sleep apnea. Planned anesthesia is general. The patient has the following known anesthesia issues: no issues in the past with anesthesia.    Patient has a bleeding risk of: no recent abnormal bleeding. Patient does not have objections to receiving blood products if needed.  Respiratory Risk Factors:  Denies: negative Social History   Tobacco Use  . Smoking status: Former Smoker    Packs/day: 0.10    Years: 3.00    Pack years: 0.30    Types: Cigarettes    Quit date: 03/05/1972    Years since quitting: 48.0  . Smokeless tobacco: Never Used  . Tobacco comment: ONE CIG A DAY  Substance Use Topics  . Alcohol use: No    Alcohol/week: 0.0 standard drinks    If symptoms present: Consider pulmonary function testing or peak flow, CXR, ECG (>40 yo), hemoglobin, glucose (>45 yo)  Cardiac Risk Factors Age >40, other risks If present - should obtain ECG. Further tests indicated if ECG with abnormalities   METs - Is able to complete exercise of 4 or more METs Yes > 4 METs: climbing 1 flight of stairs, mowing the lawn (walking), gardening, golfing w/o a cart, doubles tennis, swimming, riding a bike, square dancing, jogging   Patient is having a Moderate Risk risk surgery.  High Risk surgery: emergency surgery, anticipated increased blood loss, aortic or peripheral vascular surgery Intermediate Risk: Abdominal/thoracic, head and neck, carotid endarterectomy, orthopedic surgery, prostate surgery Low Risk: Breast surgery, cataract surgery, superficial surgery, endoscopy   The following  portions of the patient's history were reviewed and updated as appropriate: allergies, current medications, past family history, past medical history, past social history, past surgical history and problem list.  Review of Systems Review of Systems  Constitutional: Negative for chills and fever.  HENT: Positive for ear pain. Negative for congestion and sore throat.   Eyes: Negative for blurred vision and double vision.  Respiratory: Negative for shortness of breath.   Cardiovascular: Negative for chest pain.  Gastrointestinal: Negative for heartburn, nausea and vomiting.  Genitourinary: Negative.   Musculoskeletal: Negative.  Negative for myalgias.  Skin: Negative for rash.  Neurological: Negative for dizziness and headaches.  Endo/Heme/Allergies: Does not bruise/bleed easily.  Psychiatric/Behavioral: Negative for depression. The patient is not nervous/anxious.       Objective:      Physical Exam BP Readings from Last 3 Encounters:  03/01/20 130/70  01/12/20 (!) 146/78  11/19/19 140/70   Wt Readings from Last 3 Encounters:  03/01/20 167 lb 8 oz (76 kg)  01/12/20 165 lb 9 oz (75.1 kg)  11/19/19 160 lb (72.6 kg)    Physical Exam Constitutional:      General: She is not in acute distress.    Appearance: She is well-developed. She is not diaphoretic.  HENT:     Right Ear: Tympanic membrane, ear canal and external ear normal.     Left Ear: Tympanic membrane, ear canal and external ear normal.     Nose: Nose normal.     Mouth/Throat:     Mouth: Mucous membranes are moist.  Pharynx: No oropharyngeal exudate or posterior oropharyngeal erythema.  Eyes:     Conjunctiva/sclera: Conjunctivae normal.  Cardiovascular:     Rate and Rhythm: Normal rate and regular rhythm.     Heart sounds: Murmur heard.    Pulmonary:     Effort: Pulmonary effort is normal. No respiratory distress.     Breath sounds: Normal breath sounds. No wheezing.  Musculoskeletal:     Cervical back:  Neck supple.  Skin:    General: Skin is warm and dry.     Capillary Refill: Capillary refill takes less than 2 seconds.  Neurological:     Mental Status: She is alert. Mental status is at baseline.  Psychiatric:        Mood and Affect: Mood normal.        Behavior: Behavior normal.      Predictors of intubation difficulty:  Morbid obesity? no  Anatomically abnormal facies? no  Prominent incisors? no  Receding mandible? no  Short, thick neck? no  Neck range of motion: normal   Cardiographics ECG: 03/28/2017 - NSR, no St changes Echocardiogram: 2014, EF 0000000, grade 1 diastolic dysfunction   Imaging Chest x-ray: not indicated   Lab Review  Ordered CMP, CBC, Ferritin        Assessment:    85 y.o. female with planned surgery as above.   Known risk factors for perioperative complications: anemia, hx of afib though currently normal rhythm and rate controled   Difficulty with intubation is not anticipated.  Cardiac Risk Estimation: per the Revised Cardiac Risk Index (Circ. 100:1043, 1999), the patient's risk factors for cardiac complications include none, putting her in: RCI RISK CLASS II (1 risk factor, risk of major cardiac compl. appr. 1.3%) -- hx of prior MI  Major Clinical Predictors: Present ? No, if Yes ->obtain cardiology consult MI <6 weeks ago, unstable angina, decompensated CHF, significant arrhythmia causing hemodynamic instability, severe valvular disease  Intermediate Clinical Predictors: Present ? No, if Yes -> cardiology if METS <4 or high risk procedure, OK for surgery if METs>4 AND low or intermediate risk Mild angina pectoris, MI >6 weeks ago, compensated CHF, DM Minor clinical Predictors: Present ? No, if Yes -> cardiology if METS <4 AND high risk procedure, OK for surgery if METs>4 OR low or intermediate risk Advanced age, abnormal ECG, cardiac rhythm other than sinus, local functional capacity, hx of stroke, uncontrolled HTN  Current medications which  may produce withdrawal symptoms if withheld perioperatively: none.      Plan:      Problem List Items Addressed This Visit      Cardiovascular and Mediastinum   Atrial fibrillation ([place]) - Primary    Not on blood thinner due to side effects. Currently rate controlled and normal rhythm on exam. Pt with cardiology appt next week - will defer to them if needed for repeat EKG      Relevant Orders   Comprehensive metabolic panel   HTN (hypertension)    BP well controlled on current medications      Relevant Orders   Comprehensive metabolic panel     Other   Iron deficiency anemia   Relevant Orders   CBC   Ferritin    Other Visit Diagnoses    Preoperative evaluation to rule out surgical contraindication          1. Preoperative workup as follows CMP, CBC, ferritin. Pt seeing Cardiology next week - will defer EKG to his office. 2. Change in medication regimen before  surgery: none, continue medication regimen including morning of surgery, with sip of water and though ok to hold if needed. 3. Prophylaxis for cardiac events with perioperative beta-blockers: not indicated. 4. Invasive hemodynamic monitoring perioperatively: at the discretion of anesthesiologist. 5. Deep vein thrombosis prophylaxis postoperatively:regimen to be chosen by surgical team. 6. Surveillance for postoperative MI with ECG immediately postoperatively and on postoperative days 1 and 2 AND troponin levels 24 hours postoperatively and on day 4 or hospital discharge (whichever comes first): not indicated.     Lynnda Child, MD

## 2020-03-01 NOTE — Assessment & Plan Note (Signed)
BP well controlled on current medications

## 2020-03-01 NOTE — Patient Instructions (Signed)
Able to proceed with surgery  Will defer EKG to cardiology if needed

## 2020-03-01 NOTE — Assessment & Plan Note (Signed)
Not on blood thinner due to side effects. Currently rate controlled and normal rhythm on exam. Pt with cardiology appt next week - will defer to them if needed for repeat EKG

## 2020-03-01 NOTE — Telephone Encounter (Signed)
LVM for pt to call clinic to make lab appt.  Please schedule labs ASAP

## 2020-03-14 ENCOUNTER — Other Ambulatory Visit (INDEPENDENT_AMBULATORY_CARE_PROVIDER_SITE_OTHER): Payer: PPO

## 2020-03-14 ENCOUNTER — Other Ambulatory Visit: Payer: Self-pay

## 2020-03-14 DIAGNOSIS — D509 Iron deficiency anemia, unspecified: Secondary | ICD-10-CM | POA: Diagnosis not present

## 2020-03-14 DIAGNOSIS — I4891 Unspecified atrial fibrillation: Secondary | ICD-10-CM

## 2020-03-14 DIAGNOSIS — I10 Essential (primary) hypertension: Secondary | ICD-10-CM

## 2020-03-15 LAB — COMPREHENSIVE METABOLIC PANEL
ALT: 12 U/L (ref 0–35)
AST: 15 U/L (ref 0–37)
Albumin: 4.5 g/dL (ref 3.5–5.2)
Alkaline Phosphatase: 49 U/L (ref 39–117)
BUN: 23 mg/dL (ref 6–23)
CO2: 25 mEq/L (ref 19–32)
Calcium: 9.7 mg/dL (ref 8.4–10.5)
Chloride: 107 mEq/L (ref 96–112)
Creatinine, Ser: 1.15 mg/dL (ref 0.40–1.20)
GFR: 44.16 mL/min — ABNORMAL LOW (ref >60.00)
Glucose, Bld: 77 mg/dL (ref 70–99)
Potassium: 3.7 mEq/L (ref 3.5–5.1)
Sodium: 139 mEq/L (ref 135–145)
Total Bilirubin: 0.5 mg/dL (ref 0.2–1.2)
Total Protein: 7.6 g/dL (ref 6.0–8.3)

## 2020-03-15 LAB — CBC
HCT: 35.6 % — ABNORMAL LOW (ref 36.0–46.0)
Hemoglobin: 12 g/dL (ref 12.0–15.0)
MCHC: 33.7 g/dL (ref 30.0–36.0)
MCV: 94.5 fl (ref 78.0–100.0)
Platelets: 239 10*3/uL (ref 150.0–400.0)
RBC: 3.77 Mil/uL — ABNORMAL LOW (ref 3.87–5.11)
RDW: 12.9 % (ref 11.5–15.5)
WBC: 6.8 10*3/uL (ref 4.0–10.5)

## 2020-03-15 LAB — FERRITIN: Ferritin: 48.3 ng/mL (ref 10.0–291.0)

## 2020-03-16 DIAGNOSIS — I2510 Atherosclerotic heart disease of native coronary artery without angina pectoris: Secondary | ICD-10-CM | POA: Diagnosis not present

## 2020-03-16 DIAGNOSIS — I10 Essential (primary) hypertension: Secondary | ICD-10-CM | POA: Diagnosis not present

## 2020-03-16 DIAGNOSIS — I480 Paroxysmal atrial fibrillation: Secondary | ICD-10-CM | POA: Diagnosis not present

## 2020-03-16 DIAGNOSIS — D649 Anemia, unspecified: Secondary | ICD-10-CM | POA: Diagnosis not present

## 2020-03-16 DIAGNOSIS — E785 Hyperlipidemia, unspecified: Secondary | ICD-10-CM | POA: Diagnosis not present

## 2020-04-22 ENCOUNTER — Emergency Department (HOSPITAL_COMMUNITY): Payer: PPO

## 2020-04-22 ENCOUNTER — Emergency Department (HOSPITAL_COMMUNITY)
Admission: EM | Admit: 2020-04-22 | Discharge: 2020-04-22 | Disposition: A | Discharge status: Home / Self Care | Payer: PPO | Attending: Emergency Medicine | Admitting: Emergency Medicine

## 2020-04-22 ENCOUNTER — Other Ambulatory Visit (HOSPITAL_COMMUNITY)
Admission: RE | Admit: 2020-04-22 | Discharge: 2020-04-22 | Disposition: A | Discharge status: Home / Self Care | Payer: PPO | Source: Ambulatory Visit | Attending: Orthopedic Surgery | Admitting: Orthopedic Surgery

## 2020-04-22 ENCOUNTER — Encounter (HOSPITAL_BASED_OUTPATIENT_CLINIC_OR_DEPARTMENT_OTHER): Payer: Self-pay | Admitting: Orthopedic Surgery

## 2020-04-22 ENCOUNTER — Other Ambulatory Visit: Payer: Self-pay

## 2020-04-22 DIAGNOSIS — S52591A Other fractures of lower end of right radius, initial encounter for closed fracture: Secondary | ICD-10-CM | POA: Diagnosis not present

## 2020-04-22 DIAGNOSIS — Z7982 Long term (current) use of aspirin: Secondary | ICD-10-CM | POA: Insufficient documentation

## 2020-04-22 DIAGNOSIS — Y9301 Activity, walking, marching and hiking: Secondary | ICD-10-CM | POA: Insufficient documentation

## 2020-04-22 DIAGNOSIS — W19XXXA Unspecified fall, initial encounter: Secondary | ICD-10-CM | POA: Diagnosis not present

## 2020-04-22 DIAGNOSIS — Z20822 Contact with and (suspected) exposure to covid-19: Secondary | ICD-10-CM | POA: Diagnosis not present

## 2020-04-22 DIAGNOSIS — Z87891 Personal history of nicotine dependence: Secondary | ICD-10-CM | POA: Diagnosis not present

## 2020-04-22 DIAGNOSIS — I10 Essential (primary) hypertension: Secondary | ICD-10-CM | POA: Diagnosis not present

## 2020-04-22 DIAGNOSIS — Z853 Personal history of malignant neoplasm of breast: Secondary | ICD-10-CM | POA: Insufficient documentation

## 2020-04-22 DIAGNOSIS — R600 Localized edema: Secondary | ICD-10-CM | POA: Diagnosis not present

## 2020-04-22 DIAGNOSIS — S6991XA Unspecified injury of right wrist, hand and finger(s), initial encounter: Secondary | ICD-10-CM | POA: Diagnosis present

## 2020-04-22 DIAGNOSIS — S62101A Fracture of unspecified carpal bone, right wrist, initial encounter for closed fracture: Secondary | ICD-10-CM

## 2020-04-22 DIAGNOSIS — S52571A Other intraarticular fracture of lower end of right radius, initial encounter for closed fracture: Secondary | ICD-10-CM | POA: Diagnosis not present

## 2020-04-22 DIAGNOSIS — Z79899 Other long term (current) drug therapy: Secondary | ICD-10-CM | POA: Diagnosis not present

## 2020-04-22 DIAGNOSIS — M25531 Pain in right wrist: Secondary | ICD-10-CM | POA: Diagnosis not present

## 2020-04-22 DIAGNOSIS — Z96652 Presence of left artificial knee joint: Secondary | ICD-10-CM | POA: Diagnosis not present

## 2020-04-22 HISTORY — DX: Fracture of unspecified carpal bone, right wrist, initial encounter for closed fracture: S62.101A

## 2020-04-22 MED ORDER — ONDANSETRON 4 MG PO TBDP
8.0000 mg | ORAL_TABLET | Freq: Once | ORAL | Status: AC
  Administered 2020-04-22: 8 mg via ORAL
  Filled 2020-04-22: qty 2

## 2020-04-22 MED ORDER — ONDANSETRON 8 MG PO TBDP: 8.0000 mg | ORAL_TABLET | Freq: Three times a day (TID) | ORAL | 0 refills | Status: DC | PRN

## 2020-04-22 MED ORDER — HYDROCODONE-ACETAMINOPHEN 5-325 MG PO TABS: 1.0000 | ORAL_TABLET | Freq: Four times a day (QID) | ORAL | 0 refills | Status: DC | PRN

## 2020-04-22 MED ORDER — HYDROCODONE-ACETAMINOPHEN 5-325 MG PO TABS
1.0000 | ORAL_TABLET | Freq: Once | ORAL | Status: AC
  Administered 2020-04-22: 1 via ORAL
  Filled 2020-04-22: qty 1

## 2020-04-22 NOTE — ED Provider Notes (Signed)
[facility] Provider Note   CSN: [id number] Arrival date & time: 04/22/20  0054     History Chief Complaint  Patient presents with  . Wrist Pain    Wanda Green is a 86 y.o. female.  The history is provided by the patient.  Wrist Pain This is a new problem. The current episode started 1 to 2 hours ago. The problem occurs constantly. The problem has been gradually worsening. Pertinent negatives include no chest pain and no headaches. Exacerbated by: movement. The symptoms are relieved by rest.   Patient reports she was walking out her door and she fell injuring her right wrist.  No weakness or dizziness prior to fall.  No head injury or LOC. She reports pain and swelling in the right wrist.  No other acute complaints    Past Medical History:  Diagnosis Date  . Anemia   . Arthritis    "knees" (04/09/2017)  . Breast cancer, right breast ([place]) 2000  . Cervical dysplasia age 61  . Claustrophobia    "SEVERE"  . Colitis, ischemic ([place])   . Complication of anesthesia   . Dysrhythmia    hx of a-fib (when her colitis flared up 2016)  back in rhythym  . Elevated cholesterol   . GERD (gastroesophageal reflux disease)    takes prilosec as needed  . Heart attack ([place]) 2016   "mild one";  Dr Terrence Dupont  . Heart murmur   . Hypertension   . OSA on CPAP    tested  2014  . Osteoporosis 06/2016   T score -3.0  . PONV (postoperative nausea and vomiting)     Patient Active Problem List   Diagnosis Date Noted  . Acute cystitis 01/12/2020  . Prediabetes 09/03/2019  . Iron deficiency anemia 09/03/2019  . Primary osteoarthritis of knee 04/09/2017  . Colitis, ischemic ([place]) 03/18/2017  . Primary osteoarthritis of left knee 01/28/2017  . History of heart attack 11/24/2014  . Colitis 08/31/2014  . Acute colitis 08/31/2014  . Obesity (BMI 30.0-34.9) 03/02/2013  . OSA (obstructive sleep apnea) 08/12/2012  . Atrial fibrillation ([place]) 06/24/2012  .  HTN (hypertension) 06/24/2012  . Other and unspecified hyperlipidemia 06/24/2012  . Allergic rhinitis due to pollen 06/24/2012  . Hay fever 06/24/2012  . History of breast cancer 06/05/2011    Past Surgical History:  Procedure Laterality Date  . BREAST BIOPSY Right 2000  . BREAST LUMPECTOMY Right 2000  . CARDIAC CATHETERIZATION N/A 11/25/2014   Procedure: Left Heart Cath and Coronary Angiography;  Surgeon: Dixie Dials, MD;  Location: [facility];  Service: Cardiovascular;  Laterality: N/A;  . CATARACT EXTRACTION W/ INTRAOCULAR LENS  IMPLANT, BILATERAL Bilateral 2018  . CATARACT EXTRACTION, BILATERAL    . CERVICAL CONE BIOPSY  age 6  . COLONOSCOPY WITH PROPOFOL N/A 09/01/2014   Procedure: COLONOSCOPY WITH PROPOFOL;  Surgeon: Ronald Lobo, MD;  Location: [facility];  Service: Endoscopy;  Laterality: N/A;  this will be done unprepped  . COLPOSCOPY    . FRACTURE SURGERY    . JOINT REPLACEMENT    . KNEE ARTHROSCOPY Bilateral   . NASAL SEPTUM SURGERY  12/14  . TOTAL KNEE ARTHROPLASTY Left 04/09/2017  . TOTAL KNEE ARTHROPLASTY Left 04/09/2017   Procedure: LEFT TOTAL KNEE ARTHROPLASTY;  Surgeon: [name], MD;  Location: [facility];  Service: Orthopedics;  Laterality: Left;  . TUBAL LIGATION    . WRIST FRACTURE SURGERY Left ~ 2006  OB History    Gravida  3   Para  3   Term  3   Preterm      AB      Living  2     SAB      IAB      Ectopic      Multiple      Live Births              Family History  Problem Relation Age of Onset  . Hypertension Mother   . Heart failure Mother   . Heart disease Mother   . Hypertension Father   . Heart disease Father   . Hypertension Sister   . Uterine cancer Sister   . Hypertension Brother   . Heart disease Brother     Social History   Tobacco Use  . Smoking status: Former Smoker    Packs/day: 0.10    Years: 3.00    Pack years: 0.30    Types: Cigarettes    Quit date: 03/05/1972    Years since quitting:  48.1  . Smokeless tobacco: Never Used  . Tobacco comment: ONE CIG A DAY  Vaping Use  . Vaping Use: Never used  Substance Use Topics  . Alcohol use: No    Alcohol/week: 0.0 standard drinks  . Drug use: No    Home Medications Prior to Admission medications   Medication Sig Start Date End Date Taking? Authorizing Provider  amLODipine (NORVASC) 2.5 MG tablet Take 2.5 mg by mouth at bedtime. 11/15/14  Yes [provider]  aspirin 81 MG EC tablet Take 1 tablet (81 mg total) by mouth daily. After you have completed 5 day Rx of Apixiban (Eliquis) Patient taking differently: Take 81 mg by mouth daily. 04/09/17  Yes Prudencio Burly III, PA-C  atorvastatin (LIPITOR) 20 MG tablet Take 10 mg by mouth every Monday, Wednesday, and Friday.    Yes [provider]  ferrous sulfate 325 (65 FE) MG tablet Take 1 tablet (325 mg total) by mouth daily with breakfast. 11/01/18  Yes Vann, Jessica U, DO  loratadine (CLARITIN) 10 MG tablet Take 10 mg by mouth daily.   Yes [provider]  losartan (COZAAR) 100 MG tablet Take 100 mg by mouth daily.   Yes [provider]  metoprolol succinate (TOPROL-XL) 25 MG 24 hr tablet Take 25-50 mg by mouth See admin instructions. Take 25mg  in the morning and 50mg  at night.   Yes [provider]  omeprazole (PRILOSEC) 20 MG capsule Take 20 mg by mouth 2 (two) times daily. 05/15/19  Yes [provider]  oxybutynin (DITROPAN) 5 MG tablet TAKE 1 TABLET BY MOUTH THREE TIMES A DAY Patient taking differently: Take 5 mg by mouth 3 (three) times daily. 12/22/19  Yes Joseph Pierini, MD  Probiotic Product (ALIGN PO) Take 1 capsule by mouth daily.   Yes [provider]    Allergies    Atorvastatin, Codeine, Doxycycline, Hydrocodone, and Oxycodone-acetaminophen  Review of Systems   Review of Systems  Cardiovascular: Negative for chest pain.  Musculoskeletal: Positive for arthralgias and joint swelling.  Neurological:  Negative for headaches.    Physical Exam Updated Vital Signs BP (!) 155/75   Pulse 73   Temp 97.9 F (36.6 C) (Oral)   Resp 18   Ht 1.6 m (5\' 3" )   Wt 72.6 kg   LMP 07/22/2015   SpO2 100%   BMI 28.34 kg/m   Physical Exam CONSTITUTIONAL: Well developed/well  nourished HEAD: Normocephalic/atraumatic EYES: EOMI ENMT: Mask in place NECK: supple no meningeal signs SPINE/BACK:entire spine nontender CV: S1/S2 noted LUNGS: Lungs are clear to auscultation bilaterally, no apparent distress ABDOMEN: soft, nontender NEURO: Pt is awake/alert/appropriate, moves all extremitiesx4.  No facial droop.   EXTREMITIES: pulses normal/equal, deformity and swelling to the right wrist.  No lacerations noted.  Distal pulses intact.  She is able to move the fingers on the right hand and no obvious sensory deficit All other extremities/joints palpated/ranged and nontender SKIN: warm, color normal PSYCH: no abnormalities of mood noted, alert and oriented to situation  ED Results / Procedures / Treatments   Labs (all labs ordered are listed, but only abnormal results are displayed) Labs Reviewed - No data to display  EKG None  Radiology DG Wrist Complete Right  Result Date: 04/22/2020 CLINICAL DATA:  Fall walking out front door.  Wrist deformity. EXAM: RIGHT WRIST - COMPLETE 3+ VIEW COMPARISON:  None. FINDINGS: Impaction fracture of the distal radial metaphysis with apex volar angulation. Fracture extends to the distal radioulnar and radiocarpal joints. Suspect faint ulna styloid fracture. Equivocal scapholunate widening. Soft tissue edema at the fracture site. IMPRESSION: Impacted angulated distal radial metaphysis fracture extending to the distal radioulnar and radiocarpal joints. Suspect faint ulna styloid fracture. Electronically Signed   By: Keith Rake M.D.   On: 04/22/2020 01:31    Procedures Procedures  SPLINT APPLICATION Date/Time: 5:91 AM Authorized by: Sharyon Cable Consent:  Verbal consent obtained. Risks and benefits: risks, benefits and alternatives were discussed Consent given by: patient Splint applied by: orthopedic technician Location details: right upper extremity Splint type: sugar tong Supplies used: ortho glass Post-procedure: The splinted body part was neurovascularly unchanged following the procedure. Patient tolerance: Patient tolerated the procedure well with no immediate complications.     Medications Ordered in ED Medications  ondansetron (ZOFRAN-ODT) disintegrating tablet 8 mg (8 mg Oral Given 04/22/20 0223)  HYDROcodone-acetaminophen (NORCO/VICODIN) 5-325 MG per tablet 1 tablet (1 tablet Oral Given 04/22/20 6384)    ED Course  I have reviewed the triage vital signs and the nursing notes.  Pertinent  imaging results that were available during my care of the patient were reviewed by me and considered in my medical decision making (see chart for details).    MDM Rules/Calculators/A&P                          Patient presents after mechanical fall sustaining a distal radius fracture Patient with impacted fracture, though does not require close reduction in the ER.  She had significant soft tissue swelling around the injury, but she is not on anticoagulants.  No signs of any head injury or other acute traumatic injury Patient tolerated splint well and is feeling improved.  She will call her orthopedic specialist at [facility] later in the morning Final Clinical Impression(s) / ED Diagnoses Final diagnoses:  Other closed intra-articular fracture of distal end of right radius, initial encounter    Rx / DC Orders ED Discharge Orders         Ordered    HYDROcodone-acetaminophen (NORCO/VICODIN) 5-325 MG tablet  Every 6 hours PRN        04/22/20 0257    ondansetron (ZOFRAN ODT) 8 MG disintegrating tablet  Every 8 hours PRN        04/22/20 0257           Ripley Fraise, MD 04/22/20 0301

## 2020-04-22 NOTE — H&P (Signed)
PREOPERATIVE H&P  Chief Complaint: right wrist pain  HPI: Wanda Green is a 86 y.o. female who presents with right wrist pain and swelling after she fell at home this morning. She went to the ER where xrays showed a right distal radius fracture. She was placed in a sugar tong splint and sent to our office. She has elected for surgical management.   Past Medical History:  Diagnosis Date  . Breast cancer, right breast ([employer]) 2000  . Celiac artery stenosis ([place])   . Chronic venous insufficiency   . Claustrophobia    "SEVERE"  . Complication of anesthesia   . Dysrhythmia    hx of a-fib (when her colitis flared up 2016)  back in rhythym  . GERD (gastroesophageal reflux disease)    takes prilosec as needed  . Heart attack ([place]) 2016   "mild one";  Dr Terrence Dupont  . Heart murmur   . History of atrial fibrillation   . History of cervical dysplasia age 77   s/p  cervical cone bx  . History of ischemic colitis 2016  . History of MI (myocardial infarction) 2016  . Hyperlipidemia   . Hypertension   . IDA (iron deficiency anemia)   . OA (osteoarthritis)    knees  . OSA on CPAP    tested  2014  . Osteoporosis 06/2016   T score -3.0  . PONV (postoperative nausea and vomiting)    Past Surgical History:  Procedure Laterality Date  . BREAST BIOPSY Right 2000  . BREAST LUMPECTOMY Right 2000  . CARDIAC CATHETERIZATION N/A 11/25/2014   Procedure: Left Heart Cath and Coronary Angiography;  Surgeon: Dixie Dials, MD;  Location: [facility];  Service: Cardiovascular;  Laterality: N/A;  . CATARACT EXTRACTION W/ INTRAOCULAR LENS  IMPLANT, BILATERAL Bilateral 2018  . CATARACT EXTRACTION, BILATERAL    . CERVICAL CONE BIOPSY  age 31  . COLONOSCOPY WITH PROPOFOL N/A 09/01/2014   Procedure: COLONOSCOPY WITH PROPOFOL;  Surgeon: Ronald Lobo, MD;  Location: [facility];  Service: Endoscopy;  Laterality: N/A;  this will be done unprepped  . COLPOSCOPY    . FRACTURE SURGERY    . JOINT  REPLACEMENT    . KNEE ARTHROSCOPY Bilateral   . NASAL SEPTUM SURGERY  12/14  . TOTAL KNEE ARTHROPLASTY Left 04/09/2017  . TOTAL KNEE ARTHROPLASTY Left 04/09/2017   Procedure: LEFT TOTAL KNEE ARTHROPLASTY;  Surgeon: [name], MD;  Location: [facility];  Service: Orthopedics;  Laterality: Left;  . TUBAL LIGATION    . WRIST FRACTURE SURGERY Left ~ 2006   Social History   Socioeconomic History  . Marital status: Widowed    Spouse name: Not on file  . Number of children: 2  . Years of education: High school  . Highest education level: Not on file  Occupational History  . Occupation: part time    Employer: [employer]  Tobacco Use  . Smoking status: Former Smoker    Packs/day: 0.10    Years: 3.00    Pack years: 0.30    Types: Cigarettes    Quit date: 03/05/1972    Years since quitting: 48.1  . Smokeless tobacco: Never Used  . Tobacco comment: ONE CIG A DAY  Vaping Use  . Vaping Use: Never used  Substance and Sexual Activity  . Alcohol use: No    Alcohol/week: 0.0 standard drinks  . Drug use: No  . Sexual activity: Not Currently    Birth control/protection: Post-menopausal  Other Topics  Concern  . 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 Hypertension Mother   . Heart failure Mother   . Heart disease Mother   . Hypertension Father   . Heart disease Father   . Hypertension Sister   . Uterine cancer Sister   . Hypertension Brother   . Heart disease Brother    Allergies  Allergen Reactions  . Atorvastatin Other (See Comments)    Muscle and leg myalgias  . Codeine Nausea And Vomiting  . Doxycycline Nausea And Vomiting  . Hydrocodone Nausea And Vomiting  . Oxycodone-Acetaminophen Nausea And Vomiting   Prior to Admission medications   Medication Sig Start Date End Date Taking? Authorizing Provider  amLODipine (NORVASC) 2.5 MG tablet Take 2.5 mg by mouth at bedtime. 11/15/14   [provider]  aspirin 81 MG EC tablet Take 1 tablet (81 mg total) by mouth daily. After you have completed 5 day Rx of Apixiban (Eliquis) Patient taking differently: Take 81 mg by mouth daily. 04/09/17   Prudencio Burly III, PA-C  atorvastatin (LIPITOR) 20 MG tablet Take 10 mg by mouth every Monday, Wednesday, and Friday.     [provider]  ferrous sulfate 325 (65 FE) MG tablet Take 1 tablet (325 mg total) by mouth daily with breakfast. 11/01/18   Geradine Girt, DO  HYDROcodone-acetaminophen (NORCO/VICODIN) 5-325 MG tablet Take 1 tablet by mouth every 6 (six) hours as needed for severe pain. 04/22/20   Ripley Fraise, MD  loratadine (CLARITIN) 10 MG tablet Take 10 mg by mouth daily.    [provider]  losartan (COZAAR) 100 MG tablet Take 100 mg by mouth daily.    [provider]  metoprolol succinate (TOPROL-XL) 25 MG 24 hr tablet Take 25-50 mg by mouth See admin instructions. Take 25mg  in the morning and 50mg  at night.    [provider]  omeprazole (PRILOSEC) 20 MG capsule Take 20 mg by mouth 2 (two) times daily. 05/15/19   [provider]  ondansetron (ZOFRAN ODT) 8 MG disintegrating tablet Take 1 tablet (8 mg total) by mouth every 8 (eight) hours as needed. 04/22/20    Ripley Fraise, MD  oxybutynin (DITROPAN) 5 MG tablet TAKE 1 TABLET BY MOUTH THREE TIMES A DAY Patient taking differently: Take 5 mg by mouth 3 (three) times daily. 12/22/19   Joseph Pierini, MD  Probiotic Product (ALIGN PO) Take 1 capsule by mouth daily.    [provider]     Positive ROS: All other systems have been reviewed and were otherwise negative  with the exception of those mentioned in the HPI and as above.  Physical Exam: General: Alert, no acute distress Cardiovascular: No pedal edema Respiratory: No cyanosis, no use of accessory musculature GI: No organomegaly, abdomen is soft and non-tender Skin: No lesions in the area of chief complaint Neurologic: Sensation intact distally Psychiatric: Patient is competent for consent with normal mood and affect Lymphatic: No axillary or cervical lymphadenopathy  MUSCULOSKELETAL: swelling and obvious deformity to right wrist, TTP, decreased ROM due to pain, able to move fingers, NVI  Imaging:  Xrays show impacted and angulated distal radial metaphysis fracture extending to the distal radioulnar and radiocarpal joints. Suspect faint ulna styloid fracture.   Assessment: Right distal radius fracture   Plan: Plan for Procedure(s): OPEN REDUCTION INTERNAL FIXATION (ORIF) RIGHT  DISTAL RADIAL FRACTURE  The risks benefits and alternatives were discussed with the patient including but not limited to the risks of nonoperative treatment, versus surgical intervention including infection, bleeding, nerve injury,  blood clots, cardiopulmonary complications, morbidity, mortality, among others, and they were willing to proceed.     Britt Bottom, PA-C Office [phone] 04/22/2020 2:26 PM

## 2020-04-22 NOTE — Progress Notes (Signed)
Orthopedic Tech Progress Note Patient Details:  Wanda Green April 06, 1934 [id number]  Ortho Devices Type of Ortho Device: Sugartong splint,Arm sling Ortho Device/Splint Location: Right Arm Ortho Device/Splint Interventions: Application,Adjustment   Post Interventions Patient Tolerated: Well Instructions Provided: Care of device   Wanda Green 04/22/2020, 2:45 AM

## 2020-04-22 NOTE — ED Triage Notes (Signed)
Pt reports she fell walking out her front door about an hour ago. No loss of consciousness. Pt with wrist deformity.

## 2020-04-22 NOTE — Discharge Instructions (Signed)
Please call your bone specialist today.  Please tell them you broke your right wrist, which is called a distal radius fracture Tell them you did not need to have it reduced in the ER but it is a severe fracture

## 2020-04-23 LAB — SARS CORONAVIRUS 2 (TAT 6-24 HRS): SARS Coronavirus 2: NEGATIVE

## 2020-04-25 ENCOUNTER — Other Ambulatory Visit: Payer: Self-pay

## 2020-04-25 ENCOUNTER — Encounter (HOSPITAL_BASED_OUTPATIENT_CLINIC_OR_DEPARTMENT_OTHER): Payer: Self-pay | Admitting: Orthopedic Surgery

## 2020-04-25 ENCOUNTER — Telehealth: Payer: Self-pay | Admitting: Family Medicine

## 2020-04-25 NOTE — Progress Notes (Addendum)
Spoke w/ via phone for pre-op interview---pt Lab needs dos----   I stat 8, ekg            Lab results------see below COVID test ------04-22-2020 negative epic Arrive at -------1200 pm NPO after MN NO Solid Food.  water from MN until---1100 am Medications to take morning of surgery -----oxybutynin tradadol prn, metoprolol succinate, omeprazole Diabetic medication -----n/a Patient Special Instructions -----bring cpap mask tubing and machine and leave  in car Pre-Op special Istructions -----none Patient verbalized understanding of instructions that were given at this phone interview. Patient denies shortness of breath, chest pain, fever, cough at this phone interview.  Medical clearance 12-28-2021dr cody epic for  Right total knee replacement surgery lov dr Terrence Dupont cardiology 03-16-2020 on chart with clkearance for right total knee replacement surgery (not scheduled)  Echo 11-13-2019 dr Terrence Dupont on chart ekg 04-05-2017 dr Terrence Dupont on chart lov vascular dr Hartford Poli 12-09-2019 [facility] Cardiac cath 04-10-2012 epic Sleep study 04-01-2015 epic

## 2020-04-25 NOTE — Telephone Encounter (Signed)
Patient called and LM on voicemail requesting a call back. Pt states that she was supposed to be getting scheduled for surgery with Dr Percell Miller (Ortho) and she never received their call when she finally was able to reach them they advised her that she was never scheduled because they have not received her Medical Clearance form. Pt states that she was under the impression that this was being faxed back the same day as her OV with Dr Einar Pheasant.   I do not see this scanned in Epic  Please advise, thanks.

## 2020-04-26 ENCOUNTER — Ambulatory Visit (HOSPITAL_BASED_OUTPATIENT_CLINIC_OR_DEPARTMENT_OTHER)
Admission: RE | Admit: 2020-04-26 | Discharge: 2020-04-26 | Disposition: A | Discharge status: Home / Self Care | Payer: PPO | Attending: Orthopedic Surgery | Admitting: Orthopedic Surgery

## 2020-04-26 ENCOUNTER — Ambulatory Visit (HOSPITAL_BASED_OUTPATIENT_CLINIC_OR_DEPARTMENT_OTHER): Payer: PPO | Admitting: Certified Registered Nurse Anesthetist

## 2020-04-26 ENCOUNTER — Encounter (HOSPITAL_BASED_OUTPATIENT_CLINIC_OR_DEPARTMENT_OTHER): Payer: Self-pay | Admitting: Orthopedic Surgery

## 2020-04-26 ENCOUNTER — Encounter (HOSPITAL_BASED_OUTPATIENT_CLINIC_OR_DEPARTMENT_OTHER)
Admission: RE | Disposition: A | Discharge status: Home / Self Care | Payer: Self-pay | Source: Home / Self Care | Attending: Orthopedic Surgery

## 2020-04-26 DIAGNOSIS — Z888 Allergy status to other drugs, medicaments and biological substances status: Secondary | ICD-10-CM | POA: Insufficient documentation

## 2020-04-26 DIAGNOSIS — Z7982 Long term (current) use of aspirin: Secondary | ICD-10-CM | POA: Insufficient documentation

## 2020-04-26 DIAGNOSIS — G4730 Sleep apnea, unspecified: Secondary | ICD-10-CM | POA: Diagnosis not present

## 2020-04-26 DIAGNOSIS — Z881 Allergy status to other antibiotic agents status: Secondary | ICD-10-CM | POA: Diagnosis not present

## 2020-04-26 DIAGNOSIS — W19XXXA Unspecified fall, initial encounter: Secondary | ICD-10-CM | POA: Insufficient documentation

## 2020-04-26 DIAGNOSIS — I10 Essential (primary) hypertension: Secondary | ICD-10-CM | POA: Diagnosis not present

## 2020-04-26 DIAGNOSIS — S52501A Unspecified fracture of the lower end of right radius, initial encounter for closed fracture: Secondary | ICD-10-CM | POA: Insufficient documentation

## 2020-04-26 DIAGNOSIS — Z87891 Personal history of nicotine dependence: Secondary | ICD-10-CM | POA: Insufficient documentation

## 2020-04-26 DIAGNOSIS — G8918 Other acute postprocedural pain: Secondary | ICD-10-CM | POA: Diagnosis not present

## 2020-04-26 DIAGNOSIS — Z885 Allergy status to narcotic agent status: Secondary | ICD-10-CM | POA: Diagnosis not present

## 2020-04-26 DIAGNOSIS — Y92009 Unspecified place in unspecified non-institutional (private) residence as the place of occurrence of the external cause: Secondary | ICD-10-CM | POA: Insufficient documentation

## 2020-04-26 HISTORY — DX: Personal history of malignant neoplasm of breast: Z85.3

## 2020-04-26 HISTORY — DX: Chronic rhinitis: J31.0

## 2020-04-26 HISTORY — DX: Personal history of colonic polyps: Z86.010

## 2020-04-26 HISTORY — DX: Chronic kidney disease, stage 3 unspecified: N18.30

## 2020-04-26 HISTORY — DX: Iron deficiency anemia, unspecified: D50.9

## 2020-04-26 HISTORY — DX: Personal history of other diseases of the digestive system: Z87.19

## 2020-04-26 HISTORY — DX: Hyperlipidemia, unspecified: E78.5

## 2020-04-26 HISTORY — DX: Stricture of artery: I77.1

## 2020-04-26 HISTORY — DX: Unspecified osteoarthritis, unspecified site: M19.90

## 2020-04-26 HISTORY — PX: OPEN REDUCTION INTERNAL FIXATION (ORIF) DISTAL RADIAL FRACTURE: SHX5989

## 2020-04-26 HISTORY — DX: Personal history of cervical dysplasia: Z87.410

## 2020-04-26 HISTORY — DX: Prediabetes: R73.03

## 2020-04-26 HISTORY — DX: Venous insufficiency (chronic) (peripheral): I87.2

## 2020-04-26 HISTORY — DX: Celiac artery compression syndrome: I77.4

## 2020-04-26 HISTORY — DX: Diaphragmatic hernia without obstruction or gangrene: K44.9

## 2020-04-26 HISTORY — DX: Personal history of adenomatous and serrated colon polyps: Z86.0101

## 2020-04-26 HISTORY — DX: Personal history of other diseases of the circulatory system: Z86.79

## 2020-04-26 LAB — POCT I-STAT, CHEM 8
BUN: 26 mg/dL — ABNORMAL HIGH (ref 8–23)
Calcium, Ion: 1.27 mmol/L (ref 1.15–1.40)
Chloride: 101 mmol/L (ref 98–111)
Creatinine, Ser: 1.2 mg/dL — ABNORMAL HIGH (ref 0.44–1.00)
Glucose, Bld: 104 mg/dL — ABNORMAL HIGH (ref 70–99)
HCT: 36 % (ref 36.0–46.0)
Hemoglobin: 12.2 g/dL (ref 12.0–15.0)
Potassium: 4.1 mmol/L (ref 3.5–5.1)
Sodium: 140 mmol/L (ref 135–145)
TCO2: 28 mmol/L (ref 22–32)

## 2020-04-26 SURGERY — OPEN REDUCTION INTERNAL FIXATION (ORIF) DISTAL RADIUS FRACTURE
Anesthesia: General | Site: Arm Lower | Laterality: Right

## 2020-04-26 MED ORDER — ONDANSETRON HCL 4 MG/2ML IJ SOLN: 4.0000 mg | Freq: Once | INTRAMUSCULAR | Status: DC | PRN

## 2020-04-26 MED ORDER — GABAPENTIN 300 MG PO CAPS
300.0000 mg | ORAL_CAPSULE | Freq: Once | ORAL | Status: AC
  Administered 2020-04-26: 300 mg via ORAL

## 2020-04-26 MED ORDER — PHENYLEPHRINE 40 MCG/ML (10ML) SYRINGE FOR IV PUSH (FOR BLOOD PRESSURE SUPPORT)
PREFILLED_SYRINGE | INTRAVENOUS | Status: AC
  Filled 2020-04-26: qty 10

## 2020-04-26 MED ORDER — PHENYLEPHRINE 40 MCG/ML (10ML) SYRINGE FOR IV PUSH (FOR BLOOD PRESSURE SUPPORT)
PREFILLED_SYRINGE | INTRAVENOUS | Status: DC | PRN
  Administered 2020-04-26: 80 ug via INTRAVENOUS
  Administered 2020-04-26 (×2): 40 ug via INTRAVENOUS

## 2020-04-26 MED ORDER — TRAMADOL HCL 50 MG PO TABS: 50.0000 mg | ORAL_TABLET | Freq: Four times a day (QID) | ORAL | 0 refills | Status: DC | PRN

## 2020-04-26 MED ORDER — FENTANYL CITRATE (PF) 100 MCG/2ML IJ SOLN
100.0000 ug | Freq: Once | INTRAMUSCULAR | Status: AC
  Administered 2020-04-26: 75 ug via INTRAVENOUS

## 2020-04-26 MED ORDER — ACETAMINOPHEN 10 MG/ML IV SOLN: 1000.0000 mg | Freq: Once | INTRAVENOUS | Status: DC | PRN

## 2020-04-26 MED ORDER — CEFAZOLIN SODIUM-DEXTROSE 2-4 GM/100ML-% IV SOLN
2.0000 g | INTRAVENOUS | Status: AC
  Administered 2020-04-26: 2 g via INTRAVENOUS

## 2020-04-26 MED ORDER — LIDOCAINE-EPINEPHRINE (PF) 2 %-1:200000 IJ SOLN
INTRAMUSCULAR | Status: DC | PRN
  Administered 2020-04-26: 10 mL via PERINEURAL

## 2020-04-26 MED ORDER — EPHEDRINE SULFATE-NACL 50-0.9 MG/10ML-% IV SOSY
PREFILLED_SYRINGE | INTRAVENOUS | Status: DC | PRN
  Administered 2020-04-26: 15 mg via INTRAVENOUS
  Administered 2020-04-26: 5 mg via INTRAVENOUS
  Administered 2020-04-26: 10 mg via INTRAVENOUS

## 2020-04-26 MED ORDER — FENTANYL CITRATE (PF) 100 MCG/2ML IJ SOLN
25.0000 ug | INTRAMUSCULAR | Status: DC | PRN
  Administered 2020-04-26: 50 ug via INTRAVENOUS

## 2020-04-26 MED ORDER — DEXAMETHASONE SODIUM PHOSPHATE 10 MG/ML IJ SOLN
INTRAMUSCULAR | Status: AC
  Filled 2020-04-26: qty 1

## 2020-04-26 MED ORDER — ONDANSETRON HCL 4 MG PO TABS: 4.0000 mg | ORAL_TABLET | Freq: Every day | ORAL | 0 refills | Status: DC | PRN

## 2020-04-26 MED ORDER — LACTATED RINGERS IV SOLN: INTRAVENOUS | Status: DC

## 2020-04-26 MED ORDER — DEXAMETHASONE SODIUM PHOSPHATE 10 MG/ML IJ SOLN
INTRAMUSCULAR | Status: DC | PRN
  Administered 2020-04-26: 5 mg via INTRAVENOUS

## 2020-04-26 MED ORDER — FENTANYL CITRATE (PF) 100 MCG/2ML IJ SOLN
INTRAMUSCULAR | Status: AC
  Filled 2020-04-26: qty 2

## 2020-04-26 MED ORDER — GABAPENTIN 300 MG PO CAPS
ORAL_CAPSULE | ORAL | Status: AC
  Filled 2020-04-26: qty 1

## 2020-04-26 MED ORDER — CEFAZOLIN SODIUM-DEXTROSE 2-4 GM/100ML-% IV SOLN
INTRAVENOUS | Status: AC
  Filled 2020-04-26: qty 100

## 2020-04-26 MED ORDER — EPHEDRINE 5 MG/ML INJ
INTRAVENOUS | Status: AC
  Filled 2020-04-26: qty 10

## 2020-04-26 MED ORDER — ONDANSETRON HCL 4 MG/2ML IJ SOLN
INTRAMUSCULAR | Status: AC
  Filled 2020-04-26: qty 2

## 2020-04-26 MED ORDER — PROPOFOL 10 MG/ML IV BOLUS
INTRAVENOUS | Status: DC | PRN
  Administered 2020-04-26: 120 mg via INTRAVENOUS

## 2020-04-26 MED ORDER — LIDOCAINE 2% (20 MG/ML) 5 ML SYRINGE
INTRAMUSCULAR | Status: DC | PRN
  Administered 2020-04-26: 80 mg via INTRAVENOUS

## 2020-04-26 MED ORDER — ONDANSETRON HCL 4 MG/2ML IJ SOLN
INTRAMUSCULAR | Status: DC | PRN
  Administered 2020-04-26: 4 mg via INTRAVENOUS

## 2020-04-26 MED ORDER — ROPIVACAINE HCL 5 MG/ML IJ SOLN
INTRAMUSCULAR | Status: DC | PRN
  Administered 2020-04-26: 20 mL via PERINEURAL

## 2020-04-26 SURGICAL SUPPLY — 62 items
APL PRP STRL LF DISP 70% ISPRP (MISCELLANEOUS) ×1
BIT DRILL 2.2 SS TIBIAL (BIT) ×1 IMPLANT
BLADE SURG 15 STRL LF DISP TIS (BLADE) ×1 IMPLANT
BLADE SURG 15 STRL SS (BLADE) ×2
BNDG CMPR 9X4 STRL LF SNTH (GAUZE/BANDAGES/DRESSINGS) ×1
BNDG COHESIVE 4X5 TAN STRL (GAUZE/BANDAGES/DRESSINGS) ×1 IMPLANT
BNDG ELASTIC 4X5.8 VLCR STR LF (GAUZE/BANDAGES/DRESSINGS) ×2 IMPLANT
BNDG ESMARK 4X9 LF (GAUZE/BANDAGES/DRESSINGS) ×2 IMPLANT
CHLORAPREP W/TINT 26 (MISCELLANEOUS) ×2 IMPLANT
CORD BIPOLAR FORCEPS 12FT (ELECTRODE) ×1 IMPLANT
COVER BACK TABLE 60X90IN (DRAPES) ×1 IMPLANT
COVER WAND RF STERILE (DRAPES) ×2 IMPLANT
CUFF TOURN SGL QUICK 18X4 (TOURNIQUET CUFF) ×1 IMPLANT
DRAPE EXTREMITY T 121X128X90 (DISPOSABLE) ×2 IMPLANT
DRAPE IMP U-DRAPE 54X76 (DRAPES) ×2 IMPLANT
DRAPE OEC MINIVIEW 54X84 (DRAPES) ×1 IMPLANT
DRAPE U-SHAPE 47X51 STRL (DRAPES) ×2 IMPLANT
DRSG EMULSION OIL 3X3 NADH (GAUZE/BANDAGES/DRESSINGS) ×1 IMPLANT
ELECT REM PT RETURN 9FT ADLT (ELECTROSURGICAL) ×2
ELECTRODE REM PT RTRN 9FT ADLT (ELECTROSURGICAL) ×1 IMPLANT
GAUZE SPONGE 4X4 12PLY STRL (GAUZE/BANDAGES/DRESSINGS) ×2 IMPLANT
GAUZE XEROFORM 1X8 LF (GAUZE/BANDAGES/DRESSINGS) IMPLANT
GLOVE SRG 8 PF TXTR STRL LF DI (GLOVE) ×2 IMPLANT
GLOVE SURG ENC MOIS LTX SZ7.5 (GLOVE) ×4 IMPLANT
GLOVE SURG POLY ORTHO LF SZ7.5 (GLOVE) ×1 IMPLANT
GLOVE SURG UNDER POLY LF SZ7 (GLOVE) ×2 IMPLANT
GLOVE SURG UNDER POLY LF SZ8 (GLOVE) ×2
GOWN STRL REUS W/TWL XL LVL3 (GOWN DISPOSABLE) ×4 IMPLANT
K-WIRE 1.6 (WIRE) ×4
K-WIRE FX5X1.6XNS BN SS (WIRE) ×2
KIT TURNOVER CYSTO (KITS) ×2 IMPLANT
KWIRE FX5X1.6XNS BN SS (WIRE) IMPLANT
MANIFOLD NEPTUNE II (INSTRUMENTS) ×1 IMPLANT
NS IRRIG 500ML POUR BTL (IV SOLUTION) ×1 IMPLANT
PACK BASIN DAY SURGERY FS (CUSTOM PROCEDURE TRAY) ×2 IMPLANT
PAD CAST 4YDX4 CTTN HI CHSV (CAST SUPPLIES) ×1 IMPLANT
PADDING CAST ABS 4INX4YD NS (CAST SUPPLIES) ×1
PADDING CAST ABS COTTON 4X4 ST (CAST SUPPLIES) ×1 IMPLANT
PADDING CAST COTTON 4X4 STRL (CAST SUPPLIES) ×2
PASSER SUT SWANSON 36MM LOOP (INSTRUMENTS) IMPLANT
PEG LOCKING SMOOTH 2.2X18 (Peg) ×2 IMPLANT
PEG LOCKING SMOOTH 2.2X20 (Screw) ×5 IMPLANT
PENCIL SMOKE EVACUATOR (MISCELLANEOUS) ×2 IMPLANT
PLATE DVR CROSSLOCK STD RT (Plate) ×1 IMPLANT
SCREW  LP NL 2.7X15MM (Screw) ×4 IMPLANT
SCREW 2.7X14MM (Screw) ×1 IMPLANT
SCREW BN 14X2.7XNONLOCK 3 LD (Screw) IMPLANT
SCREW LP NL 2.7X15MM (Screw) IMPLANT
SCREW NLOCK 2.7X14 (Screw) ×1 IMPLANT
SPLINT FAST PLASTER 5X30 (CAST SUPPLIES) ×5
SPLINT PLASTER CAST FAST 5X30 (CAST SUPPLIES) ×10 IMPLANT
SPONGE LAP 18X18 RF (DISPOSABLE) ×1 IMPLANT
STAPLER VISISTAT 35W (STAPLE) IMPLANT
SUCTION FRAZIER HANDLE 10FR (MISCELLANEOUS) ×2
SUCTION TUBE FRAZIER 10FR DISP (MISCELLANEOUS) ×1 IMPLANT
SUT ETHILON 3 0 PS 1 (SUTURE) ×1 IMPLANT
SUT VIC AB 0 CT1 27 (SUTURE) ×2
SUT VIC AB 0 CT1 27XBRD ANBCTR (SUTURE) ×1 IMPLANT
SYR BULB EAR ULCER 3OZ GRN STR (SYRINGE) ×2 IMPLANT
TOWEL OR 17X26 10 PK STRL BLUE (TOWEL DISPOSABLE) ×2 IMPLANT
TUBE CONNECTING 12X1/4 (SUCTIONS) ×1 IMPLANT
UNDERPAD 30X36 HEAVY ABSORB (UNDERPADS AND DIAPERS) ×2 IMPLANT

## 2020-04-26 NOTE — Telephone Encounter (Signed)
Surgical clearance form faxed to [facility] and received an OK fax confirmation. Pt notified by VM left on cell. (DPR).

## 2020-04-26 NOTE — Op Note (Signed)
04/26/2020  3:01 PM  PATIENT:  Wanda Green    PRE-OPERATIVE DIAGNOSIS:  RIGHT DISTAL RADIUS FRACTURE  POST-OPERATIVE DIAGNOSIS:  Same  PROCEDURE:  OPEN REDUCTION INTERNAL FIXATION (ORIF) RIGHT  DISTAL RADIAL FRACTURE  SURGEON:  [name], MD  ASSISTANT: Aggie Moats, PA-C, he was present and scrubbed throughout the case, critical for completion in a timely fashion, and for retraction, instrumentation, and closure.   ANESTHESIA:   gen  PREOPERATIVE INDICATIONS:  Wanda Green is a  86 y.o. female with a diagnosis of RIGHT DISTAL RADIUS FRACTURE who failed conservative measures and elected for surgical management.    The risks benefits and alternatives were discussed with the patient preoperatively including but not limited to the risks of infection, bleeding, nerve injury, cardiopulmonary complications, the need for revision surgery, among others, and the patient was willing to proceed.  OPERATIVE IMPLANTS: DVR plate  OPERATIVE FINDINGS: unstable fx  BLOOD LOSS: min  COMPLICATIONS: none  TOURNIQUET TIME: 30  OPERATIVE PROCEDURE:  Patient was identified in the preoperative holding area and site was marked by me She was transported to the operating theater and placed on the table in supine position taking care to pad all bony prominences. After a preincinduction time out anesthesia was induced. The right upper extremity was prepped and draped in normal sterile fashion and a pre-incision timeout was performed. She received ancef for preoperative antibiotics.   I made a 5 cm incision centered over her FCR tendon and dissected down carefully to the level of the flexor tendon sheath and incise this longitudinally and retracted the FCR radially and incised the dorsal aspect of the sheath.   I bluntly dissected the FPL muscle belly away from the brachioradialis and then sharply incised the pronator tendon from the distal radius and from the wrist capsule. I Elevated this off  the bone the fractures visible.   I released the brachioradialis from its insertion. I then debrided the fracture and performed a manual reduction. There were at least 3 articular pieces of the fracture.  I selected a plate and I placed it on the bone. I pinned it into place and was happy on multiple radiographic views with it's placement. I then fixed the plate distally with the locking pegs. I confirmed no articular penetration with the pegs and that none were prominent dorsally.   I then reduced the plate to the shaft improving the volar and radial tilt of her distal radius.  I was happy with the final fluoro xrays. I reviewed more than 3 views of the wrist including obliques and ap/lat   I thoroughly irrigated the wound and closed the pronator over top of the plate and then closed the skin in layers with absorbable stitch. Sterile dressing was applied using the PACU in stable condition.  POST OPERATIVE PLAN: NWB, Splint full time. Ambulate for DVT px.

## 2020-04-26 NOTE — Telephone Encounter (Signed)
There was a delay in sending as she returned for blood work. But note is complete and she medically stable for surgery.   Routing to MA to look for paperwork and resend clinic note

## 2020-04-26 NOTE — Anesthesia Procedure Notes (Signed)
Anesthesia Procedure Image    

## 2020-04-26 NOTE — Interval H&P Note (Signed)
History and Physical Interval Note:  04/26/2020 1:17 PM  Wanda Green  has presented today for surgery, with the diagnosis of RIGHT DISTAL RADIUS FRACTURE.  The various methods of treatment have been discussed with the patient and family. After consideration of risks, benefits and other options for treatment, the patient has consented to  Procedure(s): OPEN REDUCTION INTERNAL FIXATION (ORIF) RIGHT  DISTAL RADIAL FRACTURE (Right) as a surgical intervention.  The patient's history has been reviewed, patient examined, no change in status, stable for surgery.  I have reviewed the patient's chart and labs.  Questions were answered to the patient's satisfaction.     [name]

## 2020-04-26 NOTE — Anesthesia Procedure Notes (Signed)
Anesthesia Regional Block: Axillary brachial plexus block   Pre-Anesthetic Checklist: ,, timeout performed, Correct Patient, Correct Site, Correct Laterality, Correct Procedure, Correct Position, site marked, Risks and benefits discussed,  Surgical consent,  Pre-op evaluation,  At surgeon's request and post-op pain management  Laterality: Right  Prep: chloraprep       Needles:  Injection technique: Single-shot  Needle Type: Echogenic Needle     Needle Length: 9cm      Additional Needles:   Procedures:,,,, ultrasound used (permanent image in chart),,,,  Narrative:  Start time: 04/26/2020 1:21 PM End time: 04/26/2020 1:29 PM Injection made incrementally with aspirations every 5 mL.  Performed by: Personally  Anesthesiologist: Myrtie Soman, MD  Additional Notes: Patient tolerated the procedure well without complications

## 2020-04-26 NOTE — Transfer of Care (Signed)
Immediate Anesthesia Transfer of Care Note  Patient: Wanda Green  Procedure(s) Performed: Procedure(s) (LRB): OPEN REDUCTION INTERNAL FIXATION (ORIF) RIGHT  DISTAL RADIAL FRACTURE (Right)  Patient Location: PACU  Anesthesia Type: General  Level of Consciousness: awake, alert  and oriented  Airway & Oxygen Therapy: Patient Spontanous Breathing and Patient connected to nasal cannula oxygen  Post-op Assessment: Report given to PACU RN and Post -op Vital signs reviewed and stable  Post vital signs: Reviewed and stable  Complications: No apparent anesthesia complicationsLast Vitals:  Vitals Value Taken Time  BP    Temp 36.4 C 04/26/20 1516  Pulse 75 04/26/20 1519  Resp 13 04/26/20 1519  SpO2 100 % 04/26/20 1519  Vitals shown include unvalidated device data.  Last Pain:  Vitals:   04/26/20 1248  TempSrc: Oral  PainSc: 5       Patients Stated Pain Goal: 4 (16/10/96 0454)  Complications: No complications documented.

## 2020-04-26 NOTE — Anesthesia Procedure Notes (Signed)
Procedure Name: LMA Insertion Date/Time: 04/26/2020 2:09 PM Performed by: Gwyndolyn Saxon, CRNA Pre-anesthesia Checklist: Patient identified, Emergency Drugs available, Suction available and Patient being monitored Patient Re-evaluated:Patient Re-evaluated prior to induction Oxygen Delivery Method: Circle system utilized Preoxygenation: Pre-oxygenation with 100% oxygen Induction Type: IV induction Ventilation: Mask ventilation without difficulty LMA: LMA inserted LMA Size: 3.0 Number of attempts: 1 Airway Equipment and Method: Patient positioned with wedge pillow Placement Confirmation: positive ETCO2 and breath sounds checked- equal and bilateral Tube secured with: Tape Dental Injury: Teeth and Oropharynx as per pre-operative assessment

## 2020-04-26 NOTE — Anesthesia Preprocedure Evaluation (Signed)
Anesthesia Evaluation  Patient identified by MRN, date of birth, ID band Patient awake    Reviewed: Allergy & Precautions, H&P , NPO status , Patient's Chart, lab work & pertinent test results  History of Anesthesia Complications (+) PONV  Airway Mallampati: II  TM Distance: >3 FB Neck ROM: Full    Dental no notable dental hx.    Pulmonary sleep apnea and Continuous Positive Airway Pressure Ventilation , former smoker,    Pulmonary exam normal breath sounds clear to auscultation       Cardiovascular hypertension, Normal cardiovascular exam Rhythm:Regular Rate:Normal     Neuro/Psych negative neurological ROS  negative psych ROS   GI/Hepatic Neg liver ROS, GERD  Medicated,  Endo/Other  negative endocrine ROS  Renal/GU negative Renal ROS  negative genitourinary   Musculoskeletal negative musculoskeletal ROS (+)   Abdominal   Peds negative pediatric ROS (+)  Hematology negative hematology ROS (+)   Anesthesia Other Findings   Reproductive/Obstetrics negative OB ROS                             Anesthesia Physical Anesthesia Plan  ASA: II  Anesthesia Plan: General   Post-op Pain Management:  Regional for Post-op pain   Induction: Intravenous  PONV Risk Score and Plan: 4 or greater and Ondansetron, Dexamethasone and Treatment may vary due to age or medical condition  Airway Management Planned: LMA  Additional Equipment:   Intra-op Plan:   Post-operative Plan: Extubation in OR  Informed Consent: I have reviewed the patients History and Physical, chart, labs and discussed the procedure including the risks, benefits and alternatives for the proposed anesthesia with the patient or authorized representative who has indicated his/her understanding and acceptance.     Dental advisory given  Plan Discussed with: CRNA and Surgeon  Anesthesia Plan Comments:         Anesthesia  Quick Evaluation

## 2020-04-26 NOTE — Anesthesia Postprocedure Evaluation (Signed)
Anesthesia Post Note  Patient: Wanda Green  Procedure(s) Performed: OPEN REDUCTION INTERNAL FIXATION (ORIF) RIGHT  DISTAL RADIAL FRACTURE (Right Arm Lower)     Patient location during evaluation: PACU Anesthesia Type: General Level of consciousness: awake and alert Pain management: pain level controlled Vital Signs Assessment: post-procedure vital signs reviewed and stable Respiratory status: spontaneous breathing, nonlabored ventilation, respiratory function stable and patient connected to nasal cannula oxygen Cardiovascular status: blood pressure returned to baseline and stable Postop Assessment: no apparent nausea or vomiting Anesthetic complications: no   No complications documented.  Last Vitals:  Vitals:   04/26/20 1508 04/26/20 1516  BP: 131/65   Pulse: 79   Resp: 11   Temp:  (!) 36.4 C  SpO2: 100%     Last Pain:  Vitals:   04/26/20 1248  TempSrc: Oral  PainSc: 5                  Brooksie Ellwanger S

## 2020-04-26 NOTE — Discharge Instructions (Signed)
Regional Anesthesia Blocks ° °1. Numbness or the inability to move the "blocked" extremity may last from 3-48 hours after placement. The length of time depends on the medication injected and your individual response to the medication. If the numbness is not going away after 48 hours, call your surgeon. ° °2. The extremity that is blocked will need to be protected until the numbness is gone and the  Strength has returned. Because you cannot feel it, you will need to take extra care to avoid injury. Because it may be weak, you may have difficulty moving it or using it. You may not know what position it is in without looking at it while the block is in effect. ° °3. For blocks in the legs and feet, returning to weight bearing and walking needs to be done carefully. You will need to wait until the numbness is entirely gone and the strength has returned. You should be able to move your leg and foot normally before you try and bear weight or walk. You will need someone to be with you when you first try to ensure you do not fall and possibly risk injury. ° °4. Bruising and tenderness at the needle site are common side effects and will resolve in a few days. ° °5. Persistent numbness or new problems with movement should be communicated to the surgeon or the [facility] ([phone])/ [facility] ([phone]). ° ° ° °Post Anesthesia Home Care Instructions ° °Activity: °Get plenty of rest for the remainder of the day. A responsible adult should stay with you for 24 hours following the procedure.  °For the next 24 hours, DO NOT: °-Drive a car °-Operate machinery °-Drink alcoholic beverages °-Take any medication unless instructed by your physician °-Make any legal decisions or sign important papers. ° °Meals: °Start with liquid foods such as gelatin or soup. Progress to regular foods as tolerated. Avoid greasy, spicy, heavy foods. If nausea and/or vomiting occur, drink only clear liquids until the  nausea and/or vomiting subsides. Call your physician if vomiting continues. ° °Special Instructions/Symptoms: °Your throat may feel dry or sore from the anesthesia or the breathing tube placed in your throat during surgery. If this causes discomfort, gargle with warm salt water. The discomfort should disappear within 24 hours. ° °

## 2020-04-26 NOTE — Progress Notes (Signed)
Assisted Dr. Rose with right, ultrasound guided, axillary block. Side rails up, monitors on throughout procedure. See vital signs in flow sheet. Tolerated Procedure well. °

## 2020-04-28 ENCOUNTER — Ambulatory Visit: Payer: PPO | Admitting: Pulmonary Disease

## 2020-04-28 ENCOUNTER — Encounter (HOSPITAL_BASED_OUTPATIENT_CLINIC_OR_DEPARTMENT_OTHER): Payer: Self-pay | Admitting: Orthopedic Surgery

## 2020-05-04 DIAGNOSIS — S52501D Unspecified fracture of the lower end of right radius, subsequent encounter for closed fracture with routine healing: Secondary | ICD-10-CM | POA: Diagnosis not present

## 2020-05-26 DIAGNOSIS — J310 Chronic rhinitis: Secondary | ICD-10-CM | POA: Diagnosis not present

## 2020-05-26 DIAGNOSIS — J329 Chronic sinusitis, unspecified: Secondary | ICD-10-CM | POA: Diagnosis not present

## 2020-06-01 DIAGNOSIS — Z01812 Encounter for preprocedural laboratory examination: Secondary | ICD-10-CM | POA: Diagnosis not present

## 2020-06-01 DIAGNOSIS — S52501D Unspecified fracture of the lower end of right radius, subsequent encounter for closed fracture with routine healing: Secondary | ICD-10-CM | POA: Diagnosis not present

## 2020-06-01 DIAGNOSIS — M25561 Pain in right knee: Secondary | ICD-10-CM | POA: Diagnosis not present

## 2020-06-01 DIAGNOSIS — M1711 Unilateral primary osteoarthritis, right knee: Secondary | ICD-10-CM | POA: Diagnosis not present

## 2020-06-13 DIAGNOSIS — M25552 Pain in left hip: Secondary | ICD-10-CM | POA: Diagnosis not present

## 2020-06-13 DIAGNOSIS — M25562 Pain in left knee: Secondary | ICD-10-CM | POA: Diagnosis not present

## 2020-06-13 DIAGNOSIS — M545 Low back pain, unspecified: Secondary | ICD-10-CM | POA: Diagnosis not present

## 2020-06-20 DIAGNOSIS — M25562 Pain in left knee: Secondary | ICD-10-CM | POA: Diagnosis not present

## 2020-06-21 ENCOUNTER — Encounter: Payer: Self-pay | Admitting: Pulmonary Disease

## 2020-06-21 ENCOUNTER — Ambulatory Visit: Payer: PPO | Admitting: Pulmonary Disease

## 2020-06-21 ENCOUNTER — Other Ambulatory Visit: Payer: Self-pay

## 2020-06-21 DIAGNOSIS — R011 Cardiac murmur, unspecified: Secondary | ICD-10-CM

## 2020-06-21 DIAGNOSIS — G4733 Obstructive sleep apnea (adult) (pediatric): Secondary | ICD-10-CM | POA: Diagnosis not present

## 2020-06-21 NOTE — Assessment & Plan Note (Signed)
CPAP download was reviewed which shows excellent compliance more than 4 hours every night on average, only 2 missed nights, no residual events and large leak on CPAP 11 cm This is likely related to a mouth leak She did not tolerate a full facemask in the past. She will trial a chinstrap. CPAP supplies will be renewed for a year

## 2020-06-21 NOTE — Patient Instructions (Signed)
CPAP supplies will be renewed for a year OK to try chin strap for mouth leak

## 2020-06-21 NOTE — Assessment & Plan Note (Signed)
She has an ejection systolic murmur at the base which indicates aortic stenosis. This is apparently being followed by Dr. Terrence Dupont

## 2020-06-21 NOTE — Addendum Note (Signed)
Addended by: Merrilee Seashore on: 06/21/2020 05:03 PM   Modules accepted: Orders

## 2020-06-21 NOTE — Progress Notes (Signed)
   Subjective:    Patient ID: Wanda Green, female    DOB: [birth year], 86 y.o.   MRN: [id number]  HPI  86 yo  for FU of  obstructive sleep apnea PMH - coronary artery disease and hypertension, AS-Harwani  Annual follow-up, she had a fall and fractured her right wrist which was repaired.  She is planning right knee replacement. She reports increased sleepiness during the day and falls asleep watching TV. She uses nasal mask, could not tolerate full face, complains of dryness of mouth in the mornings She got a new machine about 2 years ago and had to pay for it. She is finally retired and lives with her daughter   Significant tests/ events reviewed  08/2012 HST AHI 7/h  03/2015 CPAP titration 11 cm  Review of Systems neg for any significant sore throat, dysphagia, itching, sneezing, nasal congestion or excess/ purulent secretions, fever, chills, sweats, unintended wt loss, pleuritic or exertional cp, hempoptysis, orthopnea pnd or change in chronic leg swelling. Also denies presyncope, palpitations, heartburn, abdominal pain, nausea, vomiting, diarrhea or change in bowel or urinary habits, dysuria,hematuria, rash, arthralgias, visual complaints, headache, numbness weakness or ataxia.     Objective:   Physical Exam  Gen. Pleasant, elderly,well-nourished, in no distress ENT - no thrush, no pallor/icterus,no post nasal drip Neck: No JVD, no thyromegaly, no carotid bruits Lungs: no use of accessory muscles, no dullness to percussion, clear without rales or rhonchi  Cardiovascular: Rhythm regular, heart sounds  normal, ESM 2/6 @ base, no peripheral edema Musculoskeletal: No deformities, no cyanosis or clubbing        Assessment & Plan:

## 2020-06-22 ENCOUNTER — Other Ambulatory Visit: Payer: Self-pay

## 2020-06-22 DIAGNOSIS — M545 Low back pain, unspecified: Secondary | ICD-10-CM

## 2020-06-23 DIAGNOSIS — M1711 Unilateral primary osteoarthritis, right knee: Secondary | ICD-10-CM | POA: Diagnosis not present

## 2020-06-23 DIAGNOSIS — M25661 Stiffness of right knee, not elsewhere classified: Secondary | ICD-10-CM | POA: Diagnosis not present

## 2020-06-23 DIAGNOSIS — M25611 Stiffness of right shoulder, not elsewhere classified: Secondary | ICD-10-CM | POA: Diagnosis not present

## 2020-06-23 DIAGNOSIS — G4733 Obstructive sleep apnea (adult) (pediatric): Secondary | ICD-10-CM | POA: Diagnosis not present

## 2020-06-24 NOTE — Patient Instructions (Addendum)
DUE TO COVID-19 ONLY ONE VISITOR IS ALLOWED TO COME WITH YOU AND STAY IN THE WAITING ROOM ONLY DURING PRE OP AND PROCEDURE DAY OF SURGERY. THE 1 VISITOR  MAY VISIT WITH YOU AFTER SURGERY IN YOUR PRIVATE ROOM DURING VISITING HOURS ONLY!  YOU NEED TO HAVE A COVID 19 TEST ON_4/29_____ @_2 :15 PM_____, THIS TEST MUST BE DONE BEFORE SURGERY,  COVID TESTING SITE [place] [address], IT IS ON THE RIGHT GOING OUT [address] APPROXIMATELY  2 MINUTES PAST [employer] ON THE RIGHT. ONCE YOUR COVID TEST IS COMPLETED,  PLEASE BEGIN THE QUARANTINE INSTRUCTIONS AS OUTLINED IN YOUR HANDOUT.                Wanda Green   Your procedure is scheduled on: 07/05/20   Report to [facility] Main  Entrance   Report to admitting at   7:35 AM     Call this number if you have problems the morning of surgery [phone]   . BRUSH YOUR TEETH MORNING OF SURGERY AND RINSE YOUR MOUTH OUT, NO CHEWING GUM CANDY OR MINTS.    No food after midnight.  You can have clear liquids until 7:00 AM  . Nothing by mouth after 7:00 AM.   Take these medicines the morning of surgery with A SIP OF WATER: Metoprolol,Omeprazole, Ditropan Bring your mask and tubing to the hospital with you.                                 You may not have any metal on your body including hair pins and              piercings  Do not wear jewelry, make-up, lotions, powders or perfumes, deodorant             Do not wear nail polish on your fingernails.  Do not shave  48 hours prior to surgery.     Do not bring valuables to the hospital. [place].  Contacts, dentures or bridgework may not be worn into surgery.             [place] - Preparing for Surgery Before surgery, you can play an important role.  Because skin is not sterile, your skin needs to be as free of germs as possible.  You can reduce the number of germs on your skin by washing with  CHG (chlorahexidine gluconate) soap before surgery.  CHG is an antiseptic cleaner which kills germs and bonds with the skin to continue killing germs even after washing. Please DO NOT use if you have an allergy to CHG or antibacterial soaps.  If your skin becomes reddened/irritated stop using the CHG and inform your nurse when you arrive at Short Stay. Do not shave (including legs and underarms) for at least 48 hours prior to the first CHG shower.    Please follow these instructions carefully:  1.  Shower with CHG Soap the night before surgery and the  morning of Surgery.  2.  If you choose to wash your hair, wash your hair first as usual with your  normal  shampoo.  3.  After you shampoo, rinse your hair and body thoroughly to remove the  shampoo.  4.  Use CHG as you would any other liquid soap.  You can apply chg directly  to the skin and wash                       Gently with a scrungie or clean washcloth.  5.  Apply the CHG Soap to your body ONLY FROM THE NECK DOWN.   Do not use on face/ open                           Wound or open sores. Avoid contact with eyes, ears mouth and genitals (private parts).                       Wash face,  Genitals (private parts) with your normal soap.             6.  Wash thoroughly, paying special attention to the area where your surgery  will be performed.  7.  Thoroughly rinse your body with warm water from the neck down.  8.  DO NOT shower/wash with your normal soap after using and rinsing off  the CHG Soap.             9.  Pat yourself dry with a clean towel.            10.  Wear clean pajamas.            11.  Place clean sheets on your bed the night of your first shower and do not  sleep with pets. Day of Surgery : Do not apply any lotions/deodorants the morning of surgery.  Please wear clean clothes to the hospital/surgery center.  FAILURE TO FOLLOW THESE INSTRUCTIONS MAY RESULT IN THE CANCELLATION OF YOUR  SURGERY PATIENT SIGNATURE_________________________________  NURSE SIGNATURE__________________________________  ________________________________________________________________________   Adam Phenix  An incentive spirometer is a tool that can help keep your lungs clear and active. This tool measures how well you are filling your lungs with each breath. Taking long deep breaths may help reverse or decrease the chance of developing breathing (pulmonary) problems (especially infection) following:  A long period of time when you are unable to move or be active. BEFORE THE PROCEDURE   If the spirometer includes an indicator to show your best effort, your nurse or respiratory therapist will set it to a desired goal.  If possible, sit up straight or lean slightly forward. Try not to slouch.  Hold the incentive spirometer in an upright position. INSTRUCTIONS FOR USE  1. Sit on the edge of your bed if possible, or sit up as far as you can in bed or on a chair. 2. Hold the incentive spirometer in an upright position. 3. Breathe out normally. 4. Place the mouthpiece in your mouth and seal your lips tightly around it. 5. Breathe in slowly and as deeply as possible, raising the piston or the ball toward the top of the column. 6. Hold your breath for 3-5 seconds or for as long as possible. Allow the piston or ball to fall to the bottom of the column. 7. Remove the mouthpiece from your mouth and breathe out normally. 8. Rest for a few seconds and repeat Steps 1 through 7 at least 10 times every 1-2 hours when you are awake. Take your time and take a few normal breaths between deep breaths. 9. The spirometer may include an indicator to show your best effort.  Use the indicator as a goal to work toward during each repetition. 10. After each set of 10 deep breaths, practice coughing to be sure your lungs are clear. If you have an incision (the cut made at the time of surgery), support your  incision when coughing by placing a pillow or rolled up towels firmly against it. Once you are able to get out of bed, walk around indoors and cough well. You may stop using the incentive spirometer when instructed by your caregiver.  RISKS AND COMPLICATIONS  Take your time so you do not get dizzy or light-headed.  If you are in pain, you may need to take or ask for pain medication before doing incentive spirometry. It is harder to take a deep breath if you are having pain. AFTER USE  Rest and breathe slowly and easily.  It can be helpful to keep track of a log of your progress. Your caregiver can provide you with a simple table to help with this. If you are using the spirometer at home, follow these instructions: [place] IF:   You are having difficultly using the spirometer.  You have trouble using the spirometer as often as instructed.  Your pain medication is not giving enough relief while using the spirometer.  You develop fever of 100.5 F (38.1 C) or higher. SEEK IMMEDIATE MEDICAL CARE IF:   You cough up bloody sputum that had not been present before.  You develop fever of 102 F (38.9 C) or greater.  You develop worsening pain at or near the incision site. MAKE SURE YOU:   Understand these instructions.  Will watch your condition.  Will get help right away if you are not doing well or get worse. Document Released: 07/02/2006 Document Revised: 05/14/2011 Document Reviewed: 09/02/2006 [facility] Patient Information 2014 [place], [place].   ________________________________________________________________________

## 2020-06-27 ENCOUNTER — Encounter (HOSPITAL_COMMUNITY)
Admission: RE | Admit: 2020-06-27 | Discharge: 2020-06-27 | Disposition: A | Discharge status: Home / Self Care | Payer: PPO | Source: Ambulatory Visit | Attending: Orthopedic Surgery | Admitting: Orthopedic Surgery

## 2020-06-27 ENCOUNTER — Encounter (HOSPITAL_COMMUNITY): Payer: Self-pay

## 2020-06-27 ENCOUNTER — Other Ambulatory Visit: Payer: Self-pay

## 2020-06-27 DIAGNOSIS — Z01818 Encounter for other preprocedural examination: Secondary | ICD-10-CM | POA: Diagnosis not present

## 2020-06-27 DIAGNOSIS — Z20822 Contact with and (suspected) exposure to covid-19: Secondary | ICD-10-CM | POA: Diagnosis not present

## 2020-06-27 HISTORY — DX: Depression, unspecified: F32.A

## 2020-06-27 HISTORY — DX: Peripheral vascular disease, unspecified: I73.9

## 2020-06-27 HISTORY — DX: Myoneural disorder, unspecified: G70.9

## 2020-06-27 LAB — CBC
HCT: 37.4 % (ref 36.0–46.0)
Hemoglobin: 12.2 g/dL (ref 12.0–15.0)
MCH: 32.1 pg (ref 26.0–34.0)
MCHC: 32.6 g/dL (ref 30.0–36.0)
MCV: 98.4 fL (ref 80.0–100.0)
Platelets: 217 10*3/uL (ref 150–400)
RBC: 3.8 MIL/uL — ABNORMAL LOW (ref 3.87–5.11)
RDW: 12.8 % (ref 11.5–15.5)
WBC: 8 10*3/uL (ref 4.0–10.5)
nRBC: 0 % (ref 0.0–0.2)

## 2020-06-27 LAB — SURGICAL PCR SCREEN
MRSA, PCR: NEGATIVE
Staphylococcus aureus: POSITIVE — AB

## 2020-06-27 LAB — BASIC METABOLIC PANEL
Anion gap: 8 (ref 5–15)
BUN: 25 mg/dL — ABNORMAL HIGH (ref 8–23)
CO2: 25 mmol/L (ref 22–32)
Calcium: 9.5 mg/dL (ref 8.9–10.3)
Chloride: 104 mmol/L (ref 98–111)
Creatinine, Ser: 1.25 mg/dL — ABNORMAL HIGH (ref 0.44–1.00)
GFR, Estimated: 43 mL/min — ABNORMAL LOW (ref >60)
Glucose, Bld: 111 mg/dL — ABNORMAL HIGH (ref 70–99)
Potassium: 4.9 mmol/L (ref 3.5–5.1)
Sodium: 137 mmol/L (ref 135–145)

## 2020-06-27 LAB — HEMOGLOBIN A1C
Hgb A1c MFr Bld: 5.9 % — ABNORMAL HIGH (ref 4.8–5.6)
Mean Plasma Glucose: 122.63 mg/dL

## 2020-06-27 NOTE — Progress Notes (Addendum)
COVID Vaccine Completed:Yes Date COVID Vaccine completed:04/27/19-booster 02/07/20 COVID vaccine manufacturer: Pfizer    PCP - Dr. Trilby Drummer Cardiologist - Dr. Vilinda Boehringer  Chest x-ray - no EKG - 06/27/20-chart, epic Stress Test - no ECHO - 2014-epic Cardiac Cath - 11/25/14-epic Pacemaker/ICD device last checked:NA  Sleep Study - yes CPAP - yes  Fasting Blood Sugar - NA Checks Blood Sugar _____ times a day  Blood Thinner Instructions:ASA 81/ Dr. Einar Pheasant Aspirin Instructions:none. Pt will call Dr.about it Last Dose:06/27/20  Anesthesia review:   Patient denies shortness of breath, fever, cough and chest pain at PAT appointment Yes. Pt has no SOB climbing stairs, doing house work or with ADLs.  Patient verbalized understanding of instructions that were given to them at the PAT appointment. Patient was also instructed that they will need to review over the PAT instructions again at home before surgery.yes  Pt had oral surgery done 06/23/20. She has some bruising around her mouth. She did take antibiotics.

## 2020-06-28 NOTE — Care Plan (Signed)
Ortho Bundle Case Management Note  Patient Details  Name: Wanda Green MRN: [id number] Date of Birth: [birth year]    Met with patient in the office for H&P visit. She will discharge to home with family to assist. Rolling walker has already been taken care of. She needed a platform walker due to recent wrist injury. CPM ordered. HHPT referral to [place] and OPPT set up with [employer]. Patient and MD in agreement with plan. Choice offered                  DME Arranged:  CPM DME Agency:  [facility]  HH Arranged:  PT [place] Agency:  [facility]  Additional Comments: Please contact me with any questions of if this plan should need to change.  Ladell Heads,  [facility]  [phone] 06/28/2020, 2:07 PM

## 2020-06-30 ENCOUNTER — Ambulatory Visit
Admission: RE | Admit: 2020-06-30 | Discharge: 2020-06-30 | Disposition: A | Discharge status: Home / Self Care | Payer: PPO | Source: Ambulatory Visit | Attending: Orthopedic Surgery | Admitting: Orthopedic Surgery

## 2020-06-30 DIAGNOSIS — M545 Low back pain, unspecified: Secondary | ICD-10-CM

## 2020-06-30 DIAGNOSIS — M48061 Spinal stenosis, lumbar region without neurogenic claudication: Secondary | ICD-10-CM | POA: Diagnosis not present

## 2020-07-01 ENCOUNTER — Other Ambulatory Visit (HOSPITAL_COMMUNITY): Payer: PPO

## 2020-07-01 DIAGNOSIS — M545 Low back pain, unspecified: Secondary | ICD-10-CM | POA: Diagnosis not present

## 2020-07-11 DIAGNOSIS — M5416 Radiculopathy, lumbar region: Secondary | ICD-10-CM | POA: Diagnosis not present

## 2020-07-20 DIAGNOSIS — M5416 Radiculopathy, lumbar region: Secondary | ICD-10-CM | POA: Diagnosis not present

## 2020-08-02 ENCOUNTER — Telehealth: Payer: Self-pay | Admitting: *Deleted

## 2020-08-02 NOTE — Telephone Encounter (Signed)
Patient called the office back and stated that the home covid test was negative. Patient wanted Dr. Einar Pheasant to recommend what she should do since she has been exposed to covid. Patient was given ER precautions and she verbalized understanding.

## 2020-08-02 NOTE — Telephone Encounter (Signed)
Would recommend the following  1) Isolate from Daughter if able  2) For 10 days after last exposure to daughter do the following: wear a mask around other people, watch for symptoms, consider retesting 5 days after last exposure to daughter or if you develop symptoms.   3) If symptoms or a positive covid test, call the clinic to schedule a virtual visit.

## 2020-08-02 NOTE — Telephone Encounter (Signed)
Patient called stating that her daughter tested positive for covid yesterday and she lives with her. Patient stated that she does not have any symptoms at this time. Patient stated that she has not done a covid test yet. Patient was advised that she should do a covid test. Patient was advised if a covid test is done too soon after exposure you can get a false negative. Patient stated that she is going to do a home test now and will call the office back with the results.

## 2020-08-02 NOTE — Telephone Encounter (Signed)
Spoke with pt and relayed info per Dr. Einar Pheasant

## 2020-08-04 ENCOUNTER — Other Ambulatory Visit: Payer: PPO

## 2020-08-05 ENCOUNTER — Ambulatory Visit: Payer: PPO | Attending: Critical Care Medicine

## 2020-08-05 DIAGNOSIS — Z20822 Contact with and (suspected) exposure to covid-19: Secondary | ICD-10-CM

## 2020-08-06 LAB — SARS-COV-2, NAA 2 DAY TAT

## 2020-08-06 LAB — NOVEL CORONAVIRUS, NAA: SARS-CoV-2, NAA: NOT DETECTED

## 2020-08-12 DIAGNOSIS — I480 Paroxysmal atrial fibrillation: Secondary | ICD-10-CM | POA: Diagnosis not present

## 2020-08-12 DIAGNOSIS — I2510 Atherosclerotic heart disease of native coronary artery without angina pectoris: Secondary | ICD-10-CM | POA: Diagnosis not present

## 2020-08-12 DIAGNOSIS — I10 Essential (primary) hypertension: Secondary | ICD-10-CM | POA: Diagnosis not present

## 2020-08-12 DIAGNOSIS — K559 Vascular disorder of intestine, unspecified: Secondary | ICD-10-CM | POA: Diagnosis not present

## 2020-08-12 DIAGNOSIS — R42 Dizziness and giddiness: Secondary | ICD-10-CM | POA: Diagnosis not present

## 2020-08-12 DIAGNOSIS — E785 Hyperlipidemia, unspecified: Secondary | ICD-10-CM | POA: Diagnosis not present

## 2020-08-24 ENCOUNTER — Other Ambulatory Visit: Payer: Self-pay | Admitting: Orthopedic Surgery

## 2020-08-29 DIAGNOSIS — M1711 Unilateral primary osteoarthritis, right knee: Secondary | ICD-10-CM | POA: Diagnosis not present

## 2020-09-06 NOTE — Progress Notes (Signed)
DUE TO COVID-19 ONLY ONE VISITOR IS ALLOWED TO COME WITH YOU AND STAY IN THE WAITING ROOM ONLY DURING PRE OP AND PROCEDURE DAY OF SURGERY. THE 1 VISITOR  MAY VISIT WITH YOU AFTER SURGERY IN YOUR PRIVATE ROOM DURING VISITING HOURS ONLY!  YOU NEED TO HAVE A COVID 19 TEST ON_______ @_______ , THIS TEST MUST BE DO7/15/2022 NE BEFORE SURGERY,  COVID TESTING SITE [facility] [place] [id number], IT IS ON THE RIGHT GOING OUT [address] APPROXIMATELY  2 MINUTES PAST [employer] ON THE RIGHT. ONCE YOUR COVID TEST IS COMPLETED,  PLEASE BEGIN THE QUARANTINE INSTRUCTIONS AS OUTLINED IN YOUR HANDOUT.                Wanda Green  09/06/2020   Your procedure is scheduled on:  09/20/2020   Report to [facility] Main  Entrance   Report to admitting at    [place] AM     Call this number if you have problems the morning of surgery [phone]    REMEMBER: NO  SOLID FOOD CANDY OR GUM AFTER MIDNIGHT. CLEAR LIQUIDS UNTIL  0430am        . NOTHING BY MOUTH EXCEPT CLEAR LIQUIDS UNTIL    0430am   . PLEASE FINISH ENSURE DRINK PER SURGEON ORDER  WHICH NEEDS TO BE COMPLETED AT  0430am     .      CLEAR LIQUID DIET   Foods Allowed                                                                    Coffee and tea, regular and decaf                            Fruit ices (not with fruit pulp)                                      Iced Popsicles                                    Carbonated beverages, regular and diet                                    Cranberry, grape and apple juices Sports drinks like Gatorade Lightly seasoned clear broth or consume(fat free) Sugar, honey syrup ___________________________________________________________________      BRUSH YOUR TEETH MORNING OF SURGERY AND RINSE YOUR MOUTH OUT, NO CHEWING GUM CANDY OR MINTS.     Take these medicines the morning of surgery with A SIP OF WATER: ditropan, omeprazole, claritin, toprol   DO NOT TAKE ANY DIABETIC  MEDICATIONS DAY OF YOUR SURGERY                               You may not have any metal on your body including hair pins and  piercings  Do not wear jewelry, make-up, lotions, powders or perfumes, deodorant             Do not wear nail polish on your fingernails.  Do not shave  48 hours prior to surgery.              Men may shave face and neck.   Do not bring valuables to the hospital. [place].  Contacts, dentures or bridgework may not be worn into surgery.  Leave suitcase in the car. After surgery it may be brought to your room.     Patients discharged the day of surgery will not be allowed to drive home. IF YOU ARE HAVING SURGERY AND GOING HOME THE SAME DAY, YOU MUST HAVE AN ADULT TO DRIVE YOU HOME AND BE WITH YOU FOR 24 HOURS. YOU MAY GO HOME BY TAXI OR UBER OR ORTHERWISE, BUT AN ADULT MUST ACCOMPANY YOU HOME AND STAY WITH YOU FOR 24 HOURS.  Name and phone number of your driver:  Special Instructions: N/A              Please read over the following fact sheets you were given: _____________________________________________________________________  [facility] - Preparing for Surgery Before surgery, you can play an important role.  Because skin is not sterile, your skin needs to be as free of germs as possible.  You can reduce the number of germs on your skin by washing with CHG (chlorahexidine gluconate) soap before surgery.  CHG is an antiseptic cleaner which kills germs and bonds with the skin to continue killing germs even after washing. Please DO NOT use if you have an allergy to CHG or antibacterial soaps.  If your skin becomes reddened/irritated stop using the CHG and inform your nurse when you arrive at Short Stay. Do not shave (including legs and underarms) for at least 48 hours prior to the first CHG shower.  You may shave your face/neck. Please follow these instructions carefully:  1.  Shower with CHG Soap the night  before surgery and the  morning of Surgery.  2.  If you choose to wash your hair, wash your hair first as usual with your  normal  shampoo.  3.  After you shampoo, rinse your hair and body thoroughly to remove the  shampoo.                           4.  Use CHG as you would any other liquid soap.  You can apply chg directly  to the skin and wash                       Gently with a scrungie or clean washcloth.  5.  Apply the CHG Soap to your body ONLY FROM THE NECK DOWN.   Do not use on face/ open                           Wound or open sores. Avoid contact with eyes, ears mouth and genitals (private parts).                       Wash face,  Genitals (private parts) with your normal soap.             6.  Wash thoroughly, paying special attention to the area where your surgery  will be performed.  7.  Thoroughly rinse your body with warm water from the neck down.  8.  DO NOT shower/wash with your normal soap after using and rinsing off  the CHG Soap.                9.  Pat yourself dry with a clean towel.            10.  Wear clean pajamas.            11.  Place clean sheets on your bed the night of your first shower and do not  sleep with pets. Day of Surgery : Do not apply any lotions/deodorants the morning of surgery.  Please wear clean clothes to the hospital/surgery center.  FAILURE TO FOLLOW THESE INSTRUCTIONS MAY RESULT IN THE CANCELLATION OF YOUR SURGERY PATIENT SIGNATURE_________________________________  NURSE SIGNATURE__________________________________  ________________________________________________________________________

## 2020-09-09 ENCOUNTER — Other Ambulatory Visit: Payer: Self-pay

## 2020-09-09 ENCOUNTER — Encounter (HOSPITAL_COMMUNITY)
Admission: RE | Admit: 2020-09-09 | Discharge: 2020-09-09 | Disposition: A | Discharge status: Home / Self Care | Payer: PPO | Source: Ambulatory Visit | Attending: Orthopedic Surgery | Admitting: Orthopedic Surgery

## 2020-09-09 ENCOUNTER — Encounter (HOSPITAL_COMMUNITY): Payer: Self-pay

## 2020-09-09 DIAGNOSIS — Z01812 Encounter for preprocedural laboratory examination: Secondary | ICD-10-CM | POA: Insufficient documentation

## 2020-09-09 LAB — CBC
HCT: 34.9 % — ABNORMAL LOW (ref 36.0–46.0)
Hemoglobin: 11.5 g/dL — ABNORMAL LOW (ref 12.0–15.0)
MCH: 32.4 pg (ref 26.0–34.0)
MCHC: 33 g/dL (ref 30.0–36.0)
MCV: 98.3 fL (ref 80.0–100.0)
Platelets: 220 10*3/uL (ref 150–400)
RBC: 3.55 MIL/uL — ABNORMAL LOW (ref 3.87–5.11)
RDW: 13.6 % (ref 11.5–15.5)
WBC: 7.9 10*3/uL (ref 4.0–10.5)
nRBC: 0 % (ref 0.0–0.2)

## 2020-09-09 LAB — HEMOGLOBIN A1C
Hgb A1c MFr Bld: 6.2 % — ABNORMAL HIGH (ref 4.8–5.6)
Mean Plasma Glucose: 131.24 mg/dL

## 2020-09-09 LAB — BASIC METABOLIC PANEL
Anion gap: 6 (ref 5–15)
BUN: 34 mg/dL — ABNORMAL HIGH (ref 8–23)
CO2: 26 mmol/L (ref 22–32)
Calcium: 9.4 mg/dL (ref 8.9–10.3)
Chloride: 107 mmol/L (ref 98–111)
Creatinine, Ser: 1.25 mg/dL — ABNORMAL HIGH (ref 0.44–1.00)
GFR, Estimated: 43 mL/min — ABNORMAL LOW (ref >60)
Glucose, Bld: 103 mg/dL — ABNORMAL HIGH (ref 70–99)
Potassium: 4.5 mmol/L (ref 3.5–5.1)
Sodium: 139 mmol/L (ref 135–145)

## 2020-09-09 LAB — SURGICAL PCR SCREEN
MRSA, PCR: NEGATIVE
Staphylococcus aureus: POSITIVE — AB

## 2020-09-09 NOTE — Progress Notes (Addendum)
Anesthesia Review:  PCP: DR Waunita Schooner  clearance on chart dated 04/26/20  Cardiologist : DR Terrence Dupont LOV 03/16/2020  Have requested by fax most recent ov note, stress test and echo.. 08/12/2020 OV note on chart  02/08/2020 clearance on chart Pulm- Dr Elsworth Soho- 06/21/20  Chest x-ray : EKG :06/27/20  Echo : 11/13/19 on chart  Stress test: 2008 on [facility]  Cardiac Cath :  2016  Activity level: can do a flight of stairs without difficulty  Sleep Study/ CPAP : has cpap  Fasting Blood Sugar :      / Checks Blood Sugar -- times a day:   Blood Thinner/ Instructions /Last Dose: ASA / Instructions/ Last Dose :   Requested from surgeon office- clesarances from Dr Einar Pheasant and Dr Terrence Dupont.  Pt stated she had to be cleared by both MDs.

## 2020-09-12 NOTE — Progress Notes (Signed)
PCR: Positive STAPH.

## 2020-09-13 NOTE — Care Plan (Signed)
Ortho Bundle Case Management Note  Patient Details  Name: Wanda Green MRN: [id number] Date of Birth: 04-Apr-1934      Met with patient in the office prior to surgery. She will discharge to home with family to assist. Her other medical issues have resolved and she is ready for the TKR.  She has a walker at home. CPM ordered. HHPT referral to [facility] OPPT set up with [place].  Patient and MD in agreement with plan. Choice offered.               DME Arranged:  CPM DME Agency:  [employer]  HH Arranged:  PT [place] Agency:  [facility]  Additional Comments: Please contact me with any questions of if this plan should need to change.  Ladell Heads,  [facility]  [phone] 09/13/2020, 11:12 AM

## 2020-09-14 NOTE — H&P (Signed)
Chief complaint: Right knee pain. Planned procedure date: September 20, 2020.   Medical clearance by: Dr. Einar Pheasant. Cardiac clearance by: Dr. Terrence Dupont.  History of present illness: Patient is an 86 year-old female who presents for evaluation of right knee pain.  Patient has a history of pain and functional disability in the right knee due to osteoarthritis and has failed non-surgical conservative treatments for greater than 12 weeks to include NSAIDs and oral pain medications, Corticosteroid injections, activity modification, bracing, physical therapy and a right knee meniscectomy.  Onset of symptoms has been gradual starting about ten years ago with a gradually worsening course since that time.  Patient notes she has had prior bilateral arthroscopies and meniscectomies on both knees.  She currently rates her pain at 6/10 with activity.  She has night pain, worsening pain with activity and weight bearing and pain that significantly interferes with her ADLs.  She has evidence of joint narrowing, sclerosis and osteophytes to the right knee joint.  There is no active infection. Past surgical history: Tubal ligation, several vein ablations, bilateral wrist fractures and left total knee replacement. Chronic medical conditions: History of a heart attack in 2016, history of breast cancer in 2000, hypertension, GERD, ischemic colitis, obstructive sleep apnea and a heart murmur.   Allergies: Codeine, Percocet, Vicodin, Darvocet and Demerol all cause severe nausea and vomiting.  She is also unable to take any NSAIDs due to ischemic colitis and must stay away from things like Ibuprofen, Naproxen and Celebrex.   Current medications: Atorvastatin 20 mg once a day, Metoprolol 25 mg once in the morning, Metoprolol 50 mg once at nighttime, Amlodipine 2.5 mg once at night, Losartan 100 mg once at night, Omeprazole 15 mg two times a day, Aspirin 81 mg once a day, ferrous sulfate 325 mg once a day, allergy medication once a day and  align probiotic once a day.   Family history: Positive for heart disease, heart attack, high blood pressure and diabetes in her mother.  Positive for heart disease, heart attack and high blood pressure in her father.  High blood pressure in two brothers and four sisters, as well as in a son.  Cancer in a sister.   Social history: Does not smoke or drink.  She lives in a two story home with her daughter and granddaughter.  She is retired.   Review of systems: Patient currently denies lightheadedness, dizziness, fevers, chills, nausea, vomiting, chest pain or shortness of breath.  No personal history of DVT, PE or CVA.  History of heart catheterization and MI.  No dentures.  She does have one loose tooth that she is having removed in the next couple of days.  All other systems have been reviewed and are otherwise currently negative with the exception of those mentioned in the HPI as above.  Please see associated documentation for this clinic visit for further past medical, family, surgical and social history, review of systems, and exam findings as this was reviewed by me.  EXAMINATION: Height: 5?3?Marland Kitchen  Weight: 163.2 pounds.  Blood pressure: 137/99.  Pulse: 60.  Temperature: 89.0.  O2 SAT: 99% on room air.  She is a well appearing elderly female sitting on the exam table in no acute distress.  She is awake, alert and oriented to person, place, time and situation.  She is neurovascularly intact distally.  HEENT: EOMI.  Trachea is midline.  Head is normocephalic, atraumatic.  No pharyngeal erythema present.  Pulmonary: No increased work with breathing or accessory muscle  usage.  Lungs clear to auscultation bilaterally without wheezes, rales or rhonchi.  Heart: Regular rate and rhythm without rubs or gallops appreciated.  Murmur present.  Abdomen: Soft, non-distended, non-tender.  Bowel sounds in all four quadrants.  Neuro: Cranial nerves II-XII are grossly intact without focal defect.  Sensation is intact distally.   Skin: No lesions in the area of chief complaint.  Musculoskeletal: Pain with range of motion of the right knee.  Positive medial joint line tenderness.  Moderate effusion of the right knee joint.  Range of motion from 30-90 degrees.  Pain and instability with varus and valgus stress test.  Decreased strength.  Neurovascularly intact.    X-RAYS: Plain radiographs demonstrate severe degenerative joint disease of the right knee joint.  The overall alignment is varus.  Bone quality appears to be fair for reported age and activity level. Preoperative templating of the joint replacement has been completed, documented and submitted to operating room personnel in order to optimize intraoperative equipment management.   ASSESSMENT: Right knee osteoarthritis.  PLAN: Right total knee arthroplasty.  The patient's history, physical exam, clinical judgment of the provider and imaging studies are all consistent with end stage degenerative joint disease and total joint arthroplasty is deemed medically necessary.  Treatment options including medical management, injection therapy and arthroplasty were all discussed at length.  The risks and benefits of a total joint replacement were presented and reviewed.  The risk of non-operative treatment versus surgical intervention including, but not limited to, continued pain, aseptic loosening, stiffness, dislocation/subluxation, infection, bleeding, nerve injury, blood clots, leg length discrepancy, cardiopulmonary complications, morbidity, mortality, among others were discussed.  The patient verbalized understanding and wishes to proceed with the plan.  The patient will be admitted to [facility] for surgery, pain control, physical therapy evaluation, prophylactic antibiotics, DVT prophylaxis, progress with ambulation, assessment of ADLs and discharge planning.  She will spend the night in observation.  Dental prophylaxis discussed and recommended for two years  postoperatively.  The patient does meet criteria for Tranexamic acid which we will use perioperatively.  Aspirin 81 mg b.i.d. will be used postoperatively for DVT prophylaxis, in addition to SCDs and early ambulation.  Plan for Tramadol 100 mg every 6 hours, Tylenol and Gabapentin for pain control.  Robaxin for muscle spasms.  Zofran for nausea and vomiting.  She does not think in the past when she had nausea and vomiting as a reaction to codeine products that they gave her anything, so we are going to try the Tramadol with Zofran to see if she is able to tolerate it or not.  If not we may have to try and find something different to send her.  She is already on Omeprazole for gastric protection.  Patient would like her prescriptions sent to [facility] [place].  She is planning to be discharged home with home health physical therapy via [facility] and into the care of her daughter, Sharyn Lull, who can be reached at [phone].  Follow up appointment is scheduled for October 05, 2020 at 4:15 PM.

## 2020-09-16 ENCOUNTER — Other Ambulatory Visit (HOSPITAL_COMMUNITY)
Admission: RE | Admit: 2020-09-16 | Discharge: 2020-09-16 | Disposition: A | Discharge status: Home / Self Care | Payer: PPO | Source: Ambulatory Visit | Attending: Orthopedic Surgery | Admitting: Orthopedic Surgery

## 2020-09-16 DIAGNOSIS — Z01812 Encounter for preprocedural laboratory examination: Secondary | ICD-10-CM | POA: Diagnosis not present

## 2020-09-16 DIAGNOSIS — Z20822 Contact with and (suspected) exposure to covid-19: Secondary | ICD-10-CM | POA: Diagnosis not present

## 2020-09-16 LAB — SARS CORONAVIRUS 2 (TAT 6-24 HRS): SARS Coronavirus 2: NEGATIVE

## 2020-09-19 MED ORDER — BUPIVACAINE LIPOSOME 1.3 % IJ SUSP
10.0000 mL | Freq: Once | INTRAMUSCULAR | Status: DC
  Filled 2020-09-19: qty 10

## 2020-09-19 NOTE — Anesthesia Preprocedure Evaluation (Addendum)
Anesthesia Evaluation  Patient identified by MRN, date of birth, ID band Patient awake    Reviewed: Allergy & Precautions, H&P , NPO status , Patient's Chart, lab work & pertinent test results  History of Anesthesia Complications (+) PONV and history of anesthetic complications  Airway Mallampati: II  TM Distance: >3 FB Neck ROM: Full    Dental no notable dental hx. (+) Dental Advisory Given   Pulmonary sleep apnea and Continuous Positive Airway Pressure Ventilation , former smoker,    Pulmonary exam normal        Cardiovascular hypertension, Pt. on medications and Pt. on home beta blockers Normal cardiovascular exam+ dysrhythmias Atrial Fibrillation      Neuro/Psych PSYCHIATRIC DISORDERS Anxiety Depression negative neurological ROS     GI/Hepatic Neg liver ROS, GERD  Medicated,  Endo/Other  negative endocrine ROS  Renal/GU negative Renal ROS  negative genitourinary   Musculoskeletal negative musculoskeletal ROS (+)   Abdominal   Peds negative pediatric ROS (+)  Hematology negative hematology ROS (+)   Anesthesia Other Findings   Reproductive/Obstetrics negative OB ROS                            Anesthesia Physical  Anesthesia Plan  ASA: 2  Anesthesia Plan: Spinal   Post-op Pain Management:  Regional for Post-op pain   Induction: Intravenous  PONV Risk Score and Plan: 4 or greater and Ondansetron, Dexamethasone, Treatment may vary due to age or medical condition and Propofol infusion  Airway Management Planned: Natural Airway  Additional Equipment:   Intra-op Plan:   Post-operative Plan: Extubation in OR  Informed Consent: I have reviewed the patients History and Physical, chart, labs and discussed the procedure including the risks, benefits and alternatives for the proposed anesthesia with the patient or authorized representative who has indicated his/her understanding and  acceptance.     Dental advisory given  Plan Discussed with: Anesthesiologist and CRNA  Anesthesia Plan Comments:        Anesthesia Quick Evaluation

## 2020-09-20 ENCOUNTER — Ambulatory Visit (HOSPITAL_COMMUNITY): Payer: PPO | Admitting: Anesthesiology

## 2020-09-20 ENCOUNTER — Encounter (HOSPITAL_COMMUNITY)
Admission: RE | Disposition: A | Discharge status: Home / Self Care | Payer: Self-pay | Source: Home / Self Care | Attending: Orthopedic Surgery

## 2020-09-20 ENCOUNTER — Ambulatory Visit (HOSPITAL_COMMUNITY): Payer: PPO | Admitting: Physician Assistant

## 2020-09-20 ENCOUNTER — Encounter (HOSPITAL_COMMUNITY): Payer: Self-pay | Admitting: Orthopedic Surgery

## 2020-09-20 ENCOUNTER — Ambulatory Visit (HOSPITAL_COMMUNITY): Payer: PPO

## 2020-09-20 ENCOUNTER — Ambulatory Visit (HOSPITAL_COMMUNITY)
Admission: RE | Admit: 2020-09-20 | Discharge: 2020-09-20 | Disposition: A | Discharge status: Home / Self Care | Payer: PPO | Attending: Orthopedic Surgery | Admitting: Orthopedic Surgery

## 2020-09-20 DIAGNOSIS — Z881 Allergy status to other antibiotic agents status: Secondary | ICD-10-CM | POA: Insufficient documentation

## 2020-09-20 DIAGNOSIS — M1711 Unilateral primary osteoarthritis, right knee: Secondary | ICD-10-CM | POA: Diagnosis not present

## 2020-09-20 DIAGNOSIS — Z9221 Personal history of antineoplastic chemotherapy: Secondary | ICD-10-CM | POA: Diagnosis not present

## 2020-09-20 DIAGNOSIS — Z923 Personal history of irradiation: Secondary | ICD-10-CM | POA: Insufficient documentation

## 2020-09-20 DIAGNOSIS — Z471 Aftercare following joint replacement surgery: Secondary | ICD-10-CM | POA: Diagnosis not present

## 2020-09-20 DIAGNOSIS — D509 Iron deficiency anemia, unspecified: Secondary | ICD-10-CM | POA: Diagnosis not present

## 2020-09-20 DIAGNOSIS — Z886 Allergy status to analgesic agent status: Secondary | ICD-10-CM | POA: Insufficient documentation

## 2020-09-20 DIAGNOSIS — G8918 Other acute postprocedural pain: Secondary | ICD-10-CM | POA: Diagnosis not present

## 2020-09-20 DIAGNOSIS — Z853 Personal history of malignant neoplasm of breast: Secondary | ICD-10-CM | POA: Diagnosis not present

## 2020-09-20 DIAGNOSIS — Z7982 Long term (current) use of aspirin: Secondary | ICD-10-CM | POA: Diagnosis not present

## 2020-09-20 DIAGNOSIS — Z885 Allergy status to narcotic agent status: Secondary | ICD-10-CM | POA: Insufficient documentation

## 2020-09-20 DIAGNOSIS — I252 Old myocardial infarction: Secondary | ICD-10-CM | POA: Diagnosis not present

## 2020-09-20 DIAGNOSIS — Z96651 Presence of right artificial knee joint: Secondary | ICD-10-CM | POA: Diagnosis not present

## 2020-09-20 DIAGNOSIS — I10 Essential (primary) hypertension: Secondary | ICD-10-CM | POA: Diagnosis not present

## 2020-09-20 DIAGNOSIS — Z96659 Presence of unspecified artificial knee joint: Secondary | ICD-10-CM

## 2020-09-20 DIAGNOSIS — Z79899 Other long term (current) drug therapy: Secondary | ICD-10-CM | POA: Insufficient documentation

## 2020-09-20 HISTORY — PX: TOTAL KNEE ARTHROPLASTY: SHX125

## 2020-09-20 SURGERY — ARTHROPLASTY, KNEE, TOTAL
Anesthesia: Spinal | Site: Knee | Laterality: Right

## 2020-09-20 MED ORDER — ROPIVACAINE HCL 7.5 MG/ML IJ SOLN
INTRAMUSCULAR | Status: DC | PRN
  Administered 2020-09-20: 20 mL via PERINEURAL

## 2020-09-20 MED ORDER — PROPOFOL 500 MG/50ML IV EMUL
INTRAVENOUS | Status: DC | PRN
  Administered 2020-09-20: 75 ug/kg/min via INTRAVENOUS

## 2020-09-20 MED ORDER — ONDANSETRON HCL 4 MG/2ML IJ SOLN
INTRAMUSCULAR | Status: AC
  Filled 2020-09-20: qty 2

## 2020-09-20 MED ORDER — PHENYLEPHRINE HCL-NACL 10-0.9 MG/250ML-% IV SOLN
INTRAVENOUS | Status: DC | PRN
  Administered 2020-09-20: 25 ug/min via INTRAVENOUS

## 2020-09-20 MED ORDER — SODIUM CHLORIDE 0.9% FLUSH
INTRAVENOUS | Status: DC | PRN
  Administered 2020-09-20: 30 mL

## 2020-09-20 MED ORDER — CELECOXIB 200 MG PO CAPS
200.0000 mg | ORAL_CAPSULE | Freq: Once | ORAL | Status: AC
  Administered 2020-09-20: 200 mg via ORAL
  Filled 2020-09-20: qty 1

## 2020-09-20 MED ORDER — BUPIVACAINE LIPOSOME 1.3 % IJ SUSP
20.0000 mL | Freq: Once | INTRAMUSCULAR | Status: AC
  Administered 2020-09-20: 20 mL
  Filled 2020-09-20: qty 20

## 2020-09-20 MED ORDER — SODIUM CHLORIDE 0.9 % IV SOLN
2.0000 g | INTRAVENOUS | Status: AC
  Administered 2020-09-20: 2 g via INTRAVENOUS
  Filled 2020-09-20: qty 2

## 2020-09-20 MED ORDER — TRAMADOL HCL 50 MG PO TABS: 50.0000 mg | ORAL_TABLET | Freq: Four times a day (QID) | ORAL | 0 refills | Status: DC | PRN

## 2020-09-20 MED ORDER — PROMETHAZINE HCL 25 MG/ML IJ SOLN: 6.2500 mg | INTRAMUSCULAR | Status: DC | PRN

## 2020-09-20 MED ORDER — MIDAZOLAM HCL 2 MG/2ML IJ SOLN
INTRAMUSCULAR | Status: AC
  Filled 2020-09-20: qty 2

## 2020-09-20 MED ORDER — ONDANSETRON HCL 4 MG/2ML IJ SOLN
INTRAMUSCULAR | Status: DC | PRN
  Administered 2020-09-20: 4 mg via INTRAVENOUS

## 2020-09-20 MED ORDER — METHOCARBAMOL 500 MG PO TABS: 500.0000 mg | ORAL_TABLET | Freq: Four times a day (QID) | ORAL | 1 refills | Status: DC | PRN

## 2020-09-20 MED ORDER — 0.9 % SODIUM CHLORIDE (POUR BTL) OPTIME
TOPICAL | Status: DC | PRN
  Administered 2020-09-20: 1000 mL

## 2020-09-20 MED ORDER — ACETAMINOPHEN 500 MG PO TABS
1000.0000 mg | ORAL_TABLET | Freq: Once | ORAL | Status: AC
  Administered 2020-09-20: 1000 mg via ORAL
  Filled 2020-09-20: qty 2

## 2020-09-20 MED ORDER — SODIUM CHLORIDE 0.9 % IR SOLN
Status: DC | PRN
  Administered 2020-09-20: 1000 mL

## 2020-09-20 MED ORDER — TRANEXAMIC ACID-NACL 1000-0.7 MG/100ML-% IV SOLN
1000.0000 mg | INTRAVENOUS | Status: AC
  Administered 2020-09-20: 1000 mg via INTRAVENOUS
  Filled 2020-09-20: qty 100

## 2020-09-20 MED ORDER — ASPIRIN EC 81 MG PO TBEC
81.0000 mg | DELAYED_RELEASE_TABLET | Freq: Two times a day (BID) | ORAL | 1 refills | Status: AC
Start: 2020-09-20 — End: 2021-09-20

## 2020-09-20 MED ORDER — ONDANSETRON HCL 4 MG PO TABS: 4.0000 mg | ORAL_TABLET | Freq: Every day | ORAL | 1 refills | Status: DC | PRN

## 2020-09-20 MED ORDER — PHENYLEPHRINE HCL (PRESSORS) 10 MG/ML IV SOLN
INTRAVENOUS | Status: AC
  Filled 2020-09-20: qty 1

## 2020-09-20 MED ORDER — FENTANYL CITRATE (PF) 100 MCG/2ML IJ SOLN
INTRAMUSCULAR | Status: AC
  Filled 2020-09-20: qty 2

## 2020-09-20 MED ORDER — ORAL CARE MOUTH RINSE: 15.0000 mL | Freq: Once | OROMUCOSAL | Status: AC

## 2020-09-20 MED ORDER — FENTANYL CITRATE (PF) 100 MCG/2ML IJ SOLN
INTRAMUSCULAR | Status: DC | PRN
  Administered 2020-09-20 (×2): 50 ug via INTRAVENOUS

## 2020-09-20 MED ORDER — PROPOFOL 10 MG/ML IV BOLUS
INTRAVENOUS | Status: AC
  Filled 2020-09-20: qty 20

## 2020-09-20 MED ORDER — BUPIVACAINE-EPINEPHRINE (PF) 0.25% -1:200000 IJ SOLN
INTRAMUSCULAR | Status: AC
  Filled 2020-09-20: qty 30

## 2020-09-20 MED ORDER — SODIUM CHLORIDE (PF) 0.9 % IJ SOLN
INTRAMUSCULAR | Status: AC
  Filled 2020-09-20: qty 30

## 2020-09-20 MED ORDER — HYDROCODONE-ACETAMINOPHEN 5-325 MG PO TABS: 1.0000 | ORAL_TABLET | ORAL | 0 refills | Status: DC | PRN

## 2020-09-20 MED ORDER — FENTANYL CITRATE (PF) 100 MCG/2ML IJ SOLN: 25.0000 ug | INTRAMUSCULAR | Status: DC | PRN

## 2020-09-20 MED ORDER — MIDAZOLAM HCL 5 MG/5ML IJ SOLN
INTRAMUSCULAR | Status: DC | PRN
  Administered 2020-09-20: .5 mg via INTRAVENOUS

## 2020-09-20 MED ORDER — BUPIVACAINE IN DEXTROSE 0.75-8.25 % IT SOLN
INTRATHECAL | Status: DC | PRN
  Administered 2020-09-20: 1.8 mL via INTRATHECAL

## 2020-09-20 MED ORDER — TRAMADOL HCL 50 MG PO TABS
50.0000 mg | ORAL_TABLET | Freq: Four times a day (QID) | ORAL | Status: DC
Start: 2020-09-20 — End: 2020-09-20
  Administered 2020-09-20: 50 mg via ORAL

## 2020-09-20 MED ORDER — PROPOFOL 10 MG/ML IV BOLUS
INTRAVENOUS | Status: DC | PRN
Start: 2020-09-20 — End: 2020-09-20
  Administered 2020-09-20: 20 mg via INTRAVENOUS

## 2020-09-20 MED ORDER — WATER FOR IRRIGATION, STERILE IR SOLN
Status: DC | PRN
  Administered 2020-09-20: 2000 mL

## 2020-09-20 MED ORDER — AMISULPRIDE (ANTIEMETIC) 5 MG/2ML IV SOLN: 10.0000 mg | Freq: Once | INTRAVENOUS | Status: DC | PRN

## 2020-09-20 MED ORDER — CHLORHEXIDINE GLUCONATE 0.12 % MT SOLN
15.0000 mL | Freq: Once | OROMUCOSAL | Status: AC
  Administered 2020-09-20: 15 mL via OROMUCOSAL

## 2020-09-20 MED ORDER — ACETAMINOPHEN 500 MG PO TABS: 1000.0000 mg | ORAL_TABLET | Freq: Once | ORAL | Status: DC

## 2020-09-20 MED ORDER — TRAMADOL HCL 50 MG PO TABS
ORAL_TABLET | ORAL | Status: AC
  Filled 2020-09-20: qty 1

## 2020-09-20 MED ORDER — DEXAMETHASONE SODIUM PHOSPHATE 10 MG/ML IJ SOLN
INTRAMUSCULAR | Status: AC
  Filled 2020-09-20: qty 1

## 2020-09-20 MED ORDER — LACTATED RINGERS IV SOLN: INTRAVENOUS | Status: DC

## 2020-09-20 MED ORDER — POVIDONE-IODINE 10 % EX SWAB: 2.0000 "application " | Freq: Once | CUTANEOUS | Status: DC

## 2020-09-20 MED ORDER — DEXAMETHASONE SODIUM PHOSPHATE 10 MG/ML IJ SOLN
INTRAMUSCULAR | Status: DC | PRN
  Administered 2020-09-20: 5 mg

## 2020-09-20 MED ORDER — DEXAMETHASONE SODIUM PHOSPHATE 10 MG/ML IJ SOLN: 8.0000 mg | Freq: Once | INTRAMUSCULAR | Status: DC

## 2020-09-20 MED ORDER — BUPIVACAINE-EPINEPHRINE 0.25% -1:200000 IJ SOLN
INTRAMUSCULAR | Status: DC | PRN
  Administered 2020-09-20: 20 mL

## 2020-09-20 MED ORDER — PROPOFOL 1000 MG/100ML IV EMUL
INTRAVENOUS | Status: AC
  Filled 2020-09-20: qty 100

## 2020-09-20 MED ORDER — METHOCARBAMOL 500 MG IVPB - SIMPLE MED: 500.0000 mg | Freq: Four times a day (QID) | INTRAVENOUS | Status: DC | PRN

## 2020-09-20 MED ORDER — METHOCARBAMOL 500 MG IVPB - SIMPLE MED
INTRAVENOUS | Status: AC
  Administered 2020-09-20: 500 mg
  Filled 2020-09-20: qty 50

## 2020-09-20 SURGICAL SUPPLY — 53 items
BAG COUNTER SPONGE SURGICOUNT (BAG) IMPLANT
BAG SPNG CNTER NS LX DISP (BAG)
BLADE HEX COATED 2.75 (ELECTRODE) ×2 IMPLANT
BLADE SAG 18X100X1.27 (BLADE) ×2 IMPLANT
BLADE SAGITTAL 25.0X1.37X90 (BLADE) ×2 IMPLANT
BLADE SURG 15 STRL LF DISP TIS (BLADE) ×1 IMPLANT
BLADE SURG 15 STRL SS (BLADE) ×2
BLADE SURG SZ10 CARB STEEL (BLADE) ×4 IMPLANT
BNDG CMPR MED 10X6 ELC LF (GAUZE/BANDAGES/DRESSINGS) ×1
BNDG ELASTIC 4X5.8 VLCR STR LF (GAUZE/BANDAGES/DRESSINGS) ×1 IMPLANT
BNDG ELASTIC 6X10 VLCR STRL LF (GAUZE/BANDAGES/DRESSINGS) ×2 IMPLANT
BOWL SMART MIX CTS (DISPOSABLE) IMPLANT
BSPLAT TIB 4 KN TRITANIUM (Knees) ×1 IMPLANT
CLSR STERI-STRIP ANTIMIC 1/2X4 (GAUZE/BANDAGES/DRESSINGS) ×2 IMPLANT
COVER SURGICAL LIGHT HANDLE (MISCELLANEOUS) ×2 IMPLANT
CUFF TOURN SGL QUICK 34 (TOURNIQUET CUFF) ×2
CUFF TRNQT CYL 34X4.125X (TOURNIQUET CUFF) ×1 IMPLANT
DECANTER SPIKE VIAL GLASS SM (MISCELLANEOUS) ×2 IMPLANT
DRAPE U-SHAPE 47X51 STRL (DRAPES) ×2 IMPLANT
DRSG MEPILEX BORDER 4X12 (GAUZE/BANDAGES/DRESSINGS) ×2 IMPLANT
DURAPREP 26ML APPLICATOR (WOUND CARE) ×4 IMPLANT
GLOVE SRG 8 PF TXTR STRL LF DI (GLOVE) ×1 IMPLANT
GLOVE SURG ENC MOIS LTX SZ7.5 (GLOVE) ×2 IMPLANT
GLOVE SURG POLYISO LF SZ7.5 (GLOVE) ×2 IMPLANT
GLOVE SURG UNDER POLY LF SZ7.5 (GLOVE) ×2 IMPLANT
GLOVE SURG UNDER POLY LF SZ8 (GLOVE) ×2
GOWN STRL REUS W/TWL LRG LVL3 (GOWN DISPOSABLE) ×2 IMPLANT
GOWN STRL REUS W/TWL XL LVL3 (GOWN DISPOSABLE) ×2 IMPLANT
HANDPIECE INTERPULSE COAX TIP (DISPOSABLE) ×2
HOLDER FOLEY CATH W/STRAP (MISCELLANEOUS) IMPLANT
IMMOBILIZER KNEE 22 UNIV (SOFTGOODS) ×2 IMPLANT
INSERT TRIATH X3 SZ4 9 (Insert) ×1 IMPLANT
KIT TURNOVER KIT A (KITS) ×2 IMPLANT
KNEE FEMORAL COMP RT RETAIN (Knees) ×1 IMPLANT
KNEE PATELLA ASYMMETRIC 9X29 (Knees) ×1 IMPLANT
KNEE TIBIAL COMP TRI SZ4 (Knees) ×1 IMPLANT
MANIFOLD NEPTUNE II (INSTRUMENTS) ×2 IMPLANT
NS IRRIG 1000ML POUR BTL (IV SOLUTION) ×2 IMPLANT
PACK ICE MAXI GEL EZY WRAP (MISCELLANEOUS) ×2 IMPLANT
PACK TOTAL KNEE CUSTOM (KITS) ×2 IMPLANT
PENCIL SMOKE EVACUATOR (MISCELLANEOUS) IMPLANT
PIN FLUTED HEDLESS FIX 3.5X1/8 (PIN) ×1 IMPLANT
PROTECTOR NERVE ULNAR (MISCELLANEOUS) ×2 IMPLANT
SET HNDPC FAN SPRY TIP SCT (DISPOSABLE) ×1 IMPLANT
SUT MNCRL AB 3-0 PS2 18 (SUTURE) ×2 IMPLANT
SUT VIC AB 0 CT1 36 (SUTURE) ×2 IMPLANT
SUT VIC AB 1 CT1 36 (SUTURE) ×4 IMPLANT
SUT VIC AB 2-0 CT1 27 (SUTURE) ×2
SUT VIC AB 2-0 CT1 TAPERPNT 27 (SUTURE) ×1 IMPLANT
TRAY FOLEY MTR SLVR 14FR STAT (SET/KITS/TRAYS/PACK) IMPLANT
TRAY FOLEY MTR SLVR 16FR STAT (SET/KITS/TRAYS/PACK) IMPLANT
TUBE SUCTION HIGH CAP CLEAR NV (SUCTIONS) ×2 IMPLANT
WRAP KNEE MAXI GEL POST OP (GAUZE/BANDAGES/DRESSINGS) ×1 IMPLANT

## 2020-09-20 NOTE — Evaluation (Signed)
Physical Therapy Evaluation Patient Details Name: Wanda Green MRN: [id number] DOB: August 15, 1934 Today's Date: 09/20/2020   History of Present Illness  86 yo female s/p R TKA. PMH: L TKA, HTN, MI, breast CA  Clinical Impression  Patient evaluated by Physical Therapy with no further acute PT needs identified. All education has been completed and the patient has no further questions.  PT doing well with PT this date. Reviewed gait and transfer safety verbally after teach back. Pt will need hands on assist/min-guard to close supervision for safety and fall prevention until HHPT deems otherwise. Reviewed use of KI and precautions. Ok to d/c from PT standpoint with family assist. Pt tol well, see below for details   See below for any follow-up Physical Therapy or equipment needs. PT is signing off. Thank you for this referral.      Follow Up Recommendations Follow surgeon's recommendation for DC plan and follow-up therapies;Supervision for mobility/OOB (HHPT)    Equipment Recommendations  None recommended by PT    Recommendations for Other Services       Precautions / Restrictions Precautions Precautions: Fall;Knee Required Braces or Orthoses: Knee Immobilizer - Right Restrictions Weight Bearing Restrictions: No Other Position/Activity Restrictions: WBAT--?? no orders, no complications in surgery per op note      Mobility  Bed Mobility Overal bed mobility: Needs Assistance Bed Mobility: Supine to Sit     Supine to sit: Min guard     General bed mobility comments: for safety    Transfers Overall transfer level: Needs assistance   Transfers: Sit to/from Stand;Stand Pivot Transfers Sit to Stand: Min assist Stand pivot transfers: Min guard       General transfer comment: cues for hand placement, RLE position and RW position for SPT  Ambulation/Gait Ambulation/Gait assistance: Min assist;Min guard Gait Distance (Feet): 85 Feet Assistive device: Rolling walker (2  wheeled) Gait Pattern/deviations: Step-to pattern;Decreased stance time - right     General Gait Details: cues for trunk extension and RW position from self, to keep feet inside RW for turns  (pt tends to push RW too far ahead)  Stairs Stairs: Yes Stairs assistance: Min assist Stair Management: Step to pattern;Forwards;With walker Number of Stairs: 3 General stair comments: cues for sequence, RW position and technique  Wheelchair Mobility    Modified Rankin (Stroke Patients Only)       Balance Overall balance assessment: Mild deficits observed, not formally tested                                           Pertinent Vitals/Pain Pain Assessment: 0-10 Pain Score: 4  Pain Location: right knee Pain Descriptors / Indicators: Aching;Grimacing Pain Intervention(s): Limited activity within patient's tolerance;Monitored during session;Repositioned;Premedicated before session    Home Living Family/patient expects to be discharged to:: Private residence Living Arrangements: Children (dtr and grand-dtr) Available Help at Discharge: Family;Available 24 hours/day Type of Home: House Home Access: Stairs to enter Entrance Stairs-Rails: None Entrance Stairs-Number of Steps: 3 Home Layout: Two level;Able to live on main level with bedroom/bathroom Home Equipment: Gilford Rile - 2 wheels;Bedside commode      Prior Function Level of Independence: Independent;Independent with assistive device(s)               Hand Dominance        Extremity/Trunk Assessment   Upper Extremity Assessment Upper Extremity Assessment: Overall WFL for tasks assessed  Lower Extremity Assessment Lower Extremity Assessment: RLE deficits/detail RLE Deficits / Details: ankle WFL, knee and hip grossly 2+/5. AAROM knee flexion ~ 5 to 65 degrees       Communication   Communication: No difficulties  Cognition Arousal/Alertness: Awake/alert Behavior During Therapy: WFL for tasks  assessed/performed Overall Cognitive Status: Within Functional Limits for tasks assessed                                        General Comments      Exercises Total Joint Exercises Ankle Circles/Pumps: AROM;Both;10 reps Quad Sets: 5 reps;Both;AROM Heel Slides: AAROM;Right;5 reps Straight Leg Raises: AAROM;AROM;Right;10 reps   Assessment/Plan    PT Assessment    PT Problem List         PT Treatment Interventions      PT Goals (Current goals can be found in the Care Plan section)  Acute Rehab PT Goals PT Goal Formulation: All assessment and education complete, DC therapy    Frequency     Barriers to discharge        Co-evaluation               AM-PAC PT "6 Clicks" Mobility  Outcome Measure Help needed turning from your back to your side while in a flat bed without using bedrails?: A Little Help needed moving from lying on your back to sitting on the side of a flat bed without using bedrails?: A Little Help needed moving to and from a bed to a chair (including a wheelchair)?: A Little Help needed standing up from a chair using your arms (e.g., wheelchair or bedside chair)?: A Little Help needed to walk in hospital room?: A Little Help needed climbing 3-5 steps with a railing? : A Little 6 Click Score: 18    End of Session Equipment Utilized During Treatment: Gait belt;Right knee immobilizer Activity Tolerance: Patient tolerated treatment well Patient left: in bed;with call bell/phone within reach   PT Visit Diagnosis: Other abnormalities of gait and mobility (R26.89);Difficulty in walking, not elsewhere classified (R26.2)    Time: 1232-1300 PT Time Calculation (min) (ACUTE ONLY): 28 min   Charges:   PT Evaluation $PT Eval Low Complexity: 1 Low PT Treatments $Gait Training: 8-22 mins        Baxter Flattery, PT  Acute Rehab Dept ([facility]) [phone] Pager [phone]  09/20/2020   [facility] 09/20/2020, 2:04 PM

## 2020-09-20 NOTE — Anesthesia Postprocedure Evaluation (Signed)
Anesthesia Post Note  Patient: Wanda Green  Procedure(s) Performed: TOTAL KNEE ARTHROPLASTY (Right: Knee)     Patient location during evaluation: PACU Anesthesia Type: Spinal Level of consciousness: awake and alert Pain management: pain level controlled Vital Signs Assessment: post-procedure vital signs reviewed and stable Respiratory status: spontaneous breathing and respiratory function stable Cardiovascular status: blood pressure returned to baseline and stable Postop Assessment: spinal receding Anesthetic complications: no   No notable events documented.  Last Vitals:  Vitals:   09/20/20 0936 09/20/20 0945  BP: 104/61 111/66  Pulse: 82 80  Resp: 16 14  Temp: 36.4 C   SpO2: 96% 95%    Last Pain:  Vitals:   09/20/20 0945  TempSrc:   PainSc: 0-No pain                 Sherie Dobrowolski DANIEL

## 2020-09-20 NOTE — Op Note (Signed)
DATE OF SURGERY:  09/20/2020 TIME: 8:52 AM  PATIENT NAME:  Wanda Green   AGE: 86 y.o.    PRE-OPERATIVE DIAGNOSIS:  OA RIGHT KNEE  POST-OPERATIVE DIAGNOSIS:  Same  PROCEDURE:  Procedure(s): TOTAL KNEE ARTHROPLASTY   SURGEON:  [name], MD   ASSISTANT:  Aggie Moats, PA-C, he was present and scrubbed throughout the case, critical for completion in a timely fashion, and for retraction, instrumentation, and closure.    OPERATIVE IMPLANTS: [employer] Triathlon CR. Press fit knee  Femur size 4, Tibia size 4, Patella size 29 3-peg oval button, with a 9 mm polyethylene insert.   PREOPERATIVE INDICATIONS:  Wanda Green is a 86 y.o. year old female with end stage bone on bone degenerative arthritis of the knee who failed conservative treatment, including injections, antiinflammatories, activity modification, and assistive devices, and had significant impairment of their activities of daily living, and elected for Total Knee Arthroplasty.   The risks, benefits, and alternatives were discussed at length including but not limited to the risks of infection, bleeding, nerve injury, stiffness, blood clots, the need for revision surgery, cardiopulmonary complications, among others, and they were willing to proceed.   OPERATIVE DESCRIPTION:  The patient was brought to the operative room and placed in a supine position.  General anesthesia was administered.  IV antibiotics were given.  The lower extremity was prepped and draped in the usual sterile fashion.  Time out was performed.  The leg was elevated and exsanguinated and the tourniquet was inflated.  Anterior approach was performed.  The patella was everted and osteophytes were removed.  The anterior horn of the medial and lateral meniscus was removed.   The distal femur was opened with the drill and the intramedullary distal femoral cutting jig was utilized, set at 5 degrees resecting 10 mm off the distal femur.  Care was taken to  protect the collateral ligaments.  The distal femoral sizing jig was applied, taking care to avoid notching.  Then the 4-in-1 cutting jig was applied and the anterior and posterior femur was cut, along with the chamfer cuts.  All posterior osteophytes were removed.  The flexion gap was then measured and was symmetric with the extension gap.  Then the extramedullary tibial cutting jig was utilized making the appropriate cut using the anterior tibial crest as a reference building in appropriate posterior slope.  Care was taken during the cut to protect the medial and collateral ligaments.  The proximal tibia was removed along with the posterior horns of the menisci.  The PCL was sacrificed.    The extensor gap was measured and was approximately 17mm.    I completed the distal femoral preparation using the appropriate jig to prepare the box.  The patella was then measured, and cut with the saw.    The proximal tibia sized and prepared accordingly with the reamer and the punch, and then all components were trialed with the above sized poly insert.  The knee was found to have excellent balance and full motion.    The above named components were then impacted into place and Poly tibial piece and patella were inserted.  I was very happy with his stability and ROM  I performed a periarticular injection with marcaine and toradol  The knee was easily taken through a range of motion and the patella tracked well and the knee irrigated copiously and the parapatellar and subcutaneous tissue closed with vicryl, and monocryl with steri strips for the skin.  The  incision was dressed with sterile gauze and the tourniquet released and the patient was awakened and returned to the PACU in stable and satisfactory condition.  There were no complications.  Total tourniquet time was roughly 75 minutes.   POSTOPERATIVE PLAN: post op Abx, DVT px: SCD's, TED's, Early ambulation and chemical px

## 2020-09-20 NOTE — Anesthesia Procedure Notes (Signed)
Spinal  Patient location during procedure: OR Start time: 09/20/2020 7:19 AM End time: 09/20/2020 7:29 AM Reason for block: surgical anesthesia Staffing Performed: anesthesiologist  Anesthesiologist: Duane Boston, MD Preanesthetic Checklist Completed: patient identified, IV checked, risks and benefits discussed, surgical consent, monitors and equipment checked, pre-op evaluation and timeout performed Spinal Block Patient position: sitting Prep: DuraPrep Patient monitoring: cardiac monitor, continuous pulse ox and blood pressure Approach: midline Location: L2-3 Injection technique: single-shot Needle Needle type: Pencan  Needle gauge: 24 G Needle length: 9 cm Assessment Events: CSF return Additional Notes Functioning IV was confirmed and monitors were applied. Sterile prep and drape, including hand hygiene and sterile gloves were used. The patient was positioned and the spine was prepped. The skin was anesthetized with lidocaine.  Free flow of clear CSF was obtained prior to injecting local anesthetic into the CSF.  The spinal needle aspirated freely following injection.  The needle was carefully withdrawn.  The patient tolerated the procedure well.

## 2020-09-20 NOTE — Anesthesia Procedure Notes (Signed)
Anesthesia Regional Block: Adductor canal block   Pre-Anesthetic Checklist: , timeout performed,  Correct Patient, Correct Site, Correct Laterality,  Correct Procedure, Correct Position, site marked,  Risks and benefits discussed,  Surgical consent,  Pre-op evaluation,  At surgeon's request and post-op pain management  Laterality: Right  Prep: chloraprep       Needles:  Injection technique: Single-shot  Needle Type: Stimulator Needle - 80     Needle Length: 10cm  Needle Gauge: 21     Additional Needles:   Narrative:  Start time: 09/20/2020 6:51 AM End time: 09/20/2020 7:01 AM Injection made incrementally with aspirations every 5 mL.  Performed by: Personally  Anesthesiologist: Duane Boston, MD

## 2020-09-20 NOTE — Transfer of Care (Signed)
Immediate Anesthesia Transfer of Care Note  Patient: Wanda Green  Procedure(s) Performed: TOTAL KNEE ARTHROPLASTY (Right: Knee)  Patient Location: PACU  Anesthesia Type:Spinal  Level of Consciousness: awake, alert , oriented and patient cooperative  Airway & Oxygen Therapy: Patient Spontanous Breathing and Patient connected to face mask oxygen  Post-op Assessment: Report given to RN and Post -op Vital signs reviewed and stable  Post vital signs: stable  Last Vitals:  Vitals Value Taken Time  BP 113/64 09/20/20 0941  Temp 36.4 C 09/20/20 0936  Pulse 80 09/20/20 0942  Resp 12 09/20/20 0942  SpO2 98 % 09/20/20 0942  Vitals shown include unvalidated device data.  Last Pain:  Vitals:   09/20/20 0616  TempSrc: Oral         Complications: No notable events documented.

## 2020-09-20 NOTE — Discharge Summary (Signed)
Discharge Summary  Patient ID: Wanda Green MRN: [id number] DOB/AGE: 1934/10/28 86 y.o.  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 levy ([facility] in [place])   Chronic rhinitis    Chronic venous insufficiency    Claustrophobia    "SEVERE"   Claustrophobia    Complication of anesthesia    Depression    Dysrhythmia 2016   GERD (gastroesophageal reflux disease)    Heart murmur    sees dr Terrence Dupont q 3 months mild murmur per pt   Hiatal hernia    History of adenomatous polyp of colon    History of atrial fibrillation    cardiologist--- dr Terrence Dupont---  first dx 2014,  per pt when at time of recurrent flare-up ischemic colitis in 2016 she was in normal rhythm   History of cancer chemotherapy 2000   for breast cancer   History of cervical dysplasia age 68   s/p  cervical cone bx   History of external beam radiation therapy 2000   right breast cancer   History of ischemic colitis    followed by dr Cristina Gong  (w/ [facility])  pt hx recurrent ischemic or infectious colitis   History of ischemic colitis    History of lower GI bleeding    History of right breast cancer oncologist--- dr Beryle Beams, [facility] Clement in epic07-19-2016 no recurrence, released prn   dx 01/ 2000,  right breast cancer, Stage II, positive 2 nodes, ER+/  s/p  right breast lumpectomy w/ sln bx's in 2000;  completed chemo/ radiation in 2000   Hyperlipidemia    Hypertension    IDA (iron deficiency anemia) 2020   Myocardial infarction ([place]) 2016   Neuromuscular disorder ([place])    neropathy lt leg   OA (osteoarthritis)    knees   OSA on CPAP cpap set on 10   followed by dr Elsworth Soho (pulmonology) study in epic  09-01-2012  milds osa   Osteoporosis 06/2016   T score -3.0   Peripheral vascular disease ([place])    vericose veins   PONV (postoperative nausea and vomiting)    Pre-diabetes    Pt denies   Right wrist fracture 04/22/2020    Surgeries: Procedure(s): TOTAL KNEE ARTHROPLASTY on 09/20/2020   Consultants (if any):   Discharged Condition: Improved  Hospital Course: Wanda Green is an 86 y.o. female who was admitted 09/20/2020 with a diagnosis of   Osteoarthritis of right knee and went to the operating room on 09/20/2020 and underwent the above named procedures.     She was given perioperative antibiotics:  Anti-infectives (From admission, onward)    Start     Dose/Rate Route Frequency Ordered Stop   09/20/20 0600  ceFAZolin (ANCEF) 2 g in sodium chloride 0.9 % 100 mL IVPB        2 g 200 mL/hr over 30 Minutes Intravenous On call to O.R. 09/20/20 0543 09/20/20 0729     .  She was given sequential compression devices, early ambulation, and Asprin for DVT prophylaxis.  She benefited maximally from the hospital stay and there were no complications.    Recent vital signs:  Vitals:   09/20/20 0936 09/20/20  0945  BP: 104/61 111/66  Pulse: 82 80  Resp: 16 14  Temp: 97.6 F (36.4 C)   SpO2: 96% 95%    Recent laboratory studies:  Lab Results  Component Value Date   HGB 11.5 (L) 09/09/2020   HGB 12.2 06/27/2020   HGB 12.2 04/26/2020   Lab Results  Component Value Date   WBC 7.9 09/09/2020   PLT 220 09/09/2020   Lab Results  Component Value Date   INR 1.1 10/31/2018   Lab Results  Component Value Date   NA 139 09/09/2020   K 4.5 09/09/2020   CL 107 09/09/2020   CO2 26 09/09/2020   BUN 34 (H) 09/09/2020   CREATININE 1.25 (H) 09/09/2020   GLUCOSE 103 (H) 09/09/2020    Discharge Medications:   Allergies as of 09/20/2020       Reactions   Atorvastatin Other (See Comments)   Muscle and leg myalgias   Codeine Nausea And Vomiting   Doxycycline Nausea And Vomiting    Hydrocodone Nausea And Vomiting   Oxycodone-acetaminophen Nausea And Vomiting        Medication List     TAKE these medications    ALIGN PO Take 1 capsule by mouth daily.   amLODipine 2.5 MG tablet Commonly known as: NORVASC Take 2.5 mg by mouth at bedtime.   aspirin EC 81 MG tablet Take 1 tablet (81 mg total) by mouth 2 (two) times daily. Swallow whole. What changed:  when to take this additional instructions   atorvastatin 20 MG tablet Commonly known as: LIPITOR Take 20 mg by mouth every Monday, Wednesday, and Friday. qhs   ferrous sulfate 325 (65 FE) MG tablet Take 1 tablet (325 mg total) by mouth daily with breakfast.   HYDROcodone-acetaminophen 5-325 MG tablet Commonly known as: NORCO/VICODIN Take 1 tablet by mouth every 4 (four) hours as needed for moderate pain.   loratadine 10 MG tablet Commonly known as: CLARITIN Take 10 mg by mouth daily.   losartan 100 MG tablet Commonly known as: COZAAR Take 100 mg by mouth daily.   methocarbamol 500 MG tablet Commonly known as: Robaxin Take 1 tablet (500 mg total) by mouth every 6 (six) hours as needed for muscle spasms.   metoprolol succinate 25 MG 24 hr tablet Commonly known as: TOPROL-XL Take 25-50 mg by mouth See admin instructions. Take 25mg  in the morning and 50mg  at night.   multivitamin capsule Take 1 capsule by mouth daily.   omeprazole 20 MG capsule Commonly known as: PRILOSEC Take 20 mg by mouth 2 (two) times daily.   ondansetron 4 MG tablet Commonly known as: Zofran Take 1 tablet (4 mg total) by mouth daily as needed for nausea or vomiting.   oxybutynin 5 MG tablet Commonly known as: DITROPAN TAKE 1 TABLET BY MOUTH THREE TIMES A DAY   traMADol 50 MG tablet Commonly known as: Ultram Take 1 tablet (50 mg total) by mouth every 6 (six) hours as needed for up to 7 days.   vitamin C 500 MG tablet Commonly known as: ASCORBIC ACID Take 500 mg by mouth daily.               Discharge  Care Instructions  (From admission, onward)           Start     Ordered   09/20/20 0000  Change dressing       Comments: Change the dressing daily with sterile 4 x 4 inch gauze dressing and apply TED  hose.  You may clean the incision with alcohol prior to redressing.   09/20/20 1042            Diagnostic Studies: No results found.  Disposition: Discharge disposition: 01-Home or Self Care       Discharge Instructions     CPM   Complete by: As directed    Continuous passive motion machine (CPM):      Use the CPM from 0 to 60 for 6-8 hours per day.      You may increase by 5-10 per day.  You may break it up into 2 or 3 sessions per day.      Use CPM for 3-4 weeks or until you are told to stop.   Call MD / Call 911   Complete by: As directed    If you experience chest pain or shortness of breath, CALL 911 and be transported to the hospital emergency room.  If you develope a fever above 101 F, pus (white drainage) or increased drainage or redness at the wound, or calf pain, call your surgeon's office.   Change dressing   Complete by: As directed    Change the dressing daily with sterile 4 x 4 inch gauze dressing and apply TED hose.  You may clean the incision with alcohol prior to redressing.   Constipation Prevention   Complete by: As directed    Drink plenty of fluids.  Prune juice and/or coffee may be helpful.  You may use a stool softener, such as Colace (over the counter) 100 mg twice a day.  Use MiraLax (over the counter) for constipation as needed but this may take several days to work.  Mag Citrate --OR-- Milk of Magnesia may also be used but follow directions on the label.   Diet - low sodium heart healthy   Complete by: As directed    Do not put a pillow under the knee. Place it under the heel.   Complete by: As directed    Place yellow block under heel at all times except when up walking or in CPM.  You must sleep in it at night   Increase activity slowly as  tolerated   Complete by: As directed    Patient may shower   Complete by: As directed    You may shower over the brown dressing.  Once the dressing is removed you may shower without a dressing once there is no drainage.  Do not wash over the wound.  If drainage remains, cover wound with plastic wrap and then shower   Post-operative opioid taper instructions:   Complete by: As directed    POST-OPERATIVE OPIOID TAPER INSTRUCTIONS: It is important to wean off of your opioid medication as soon as possible. If you do not need pain medication after your surgery it is ok to stop day one. Opioids include: Codeine, Hydrocodone(Norco, Vicodin), Oxycodone(Percocet, oxycontin) and hydromorphone amongst others.  Long term and even short term use of opiods can cause: Increased pain response Dependence Constipation Depression Respiratory depression And more.  Withdrawal symptoms can include Flu like symptoms Nausea, vomiting And more Techniques to manage these symptoms Hydrate well Eat regular healthy meals Stay active Use relaxation techniques(deep breathing, meditating, yoga) Do Not substitute Alcohol to help with tapering If you have been on opioids for less than two weeks and do not have pain than it is ok to stop all together.  Plan to wean off of opioids This plan should start within one week  post op of your joint replacement. Maintain the same interval or time between taking each dose and first decrease the dose.  Cut the total daily intake of opioids by one tablet each day Next start to increase the time between doses. The last dose that should be eliminated is the evening dose.      TED hose   Complete by: As directed    Use stockings (TED hose) for 2 weeks on both leg(s).  You may remove them at night for sleeping.        Follow-up Information     [name], MD. Go on 10/05/2020.   Specialty: Orthopedic Surgery Why: Your appointment is scheduled for 4:15 Contact  information: [address] [place] [address] [phone]         [facility] Follow up.   Specialty: [place] Why: HHPT will provide 5 home visits prior to starting outpatient physical therapy  Contact information: [address] [place] [address] [phone]         [facility], [place]. Go on 10/05/2020.   Why: Your physical therapy appointment is scheduled for 1:00. Please arrive at 12:45 to complete your paperwork Contact information: [facility] [address] [place] [address] [phone]                  Signed: Nehemiah Massed PA-C 09/20/2020, 10:46 AM

## 2020-09-20 NOTE — Discharge Instructions (Signed)
You may bear weight as tolerated. Keep your dressing on and dry until follow up. Take medicine to prevent blood clots as directed. Take pain medicine as needed with the goal of transitioning to over the counter medicines.  If needed, you may increase breakthrough pain medication (oxycodone ) for the first few days post op - up to 2 tablets every 4 hours.  Stop this medication as soon as you are able.  INSTRUCTIONS AFTER JOINT REPLACEMENT   Remove items at home which could result in a fall. This includes throw rugs or furniture in walking pathways ICE to the affected joint every three hours while awake for 30 minutes at a time, for at least the first 3-5 days, and then as needed for pain and swelling.  Continue to use ice for pain and swelling. You may notice swelling that will progress down to the foot and ankle.  This is normal after surgery.  Elevate your leg when you are not up walking on it.   Continue to use the breathing machine you got in the hospital (incentive spirometer) which will help keep your temperature down.  It is common for your temperature to cycle up and down following surgery, especially at night when you are not up moving around and exerting yourself.  The breathing machine keeps your lungs expanded and your temperature down.   DIET:  As you were doing prior to hospitalization, we recommend a well-balanced diet.  DRESSING / WOUND CARE / SHOWERING  You may shower 3 days after surgery, but keep the wounds dry during showering.  You may use an occlusive plastic wrap (Press'n Seal for example) with blue painter's tape at edges, NO SOAKING/SUBMERGING IN THE BATHTUB.  If the bandage gets wet, change with a clean dry gauze.  If the incision gets wet, pat the wound dry with a clean towel.  ACTIVITY  Increase activity slowly as tolerated, but follow the weight bearing instructions below.   No driving for 6 weeks or until further direction given by your physician.  You cannot drive  while taking narcotics.  No lifting or carrying greater than 10 lbs. until further directed by your surgeon. Avoid periods of inactivity such as sitting longer than an hour when not asleep. This helps prevent blood clots.  You may return to work once you are authorized by your doctor.     WEIGHT BEARING   Weight bearing as tolerated with assist device (walker, cane, etc) as directed, use it as long as suggested by your surgeon or therapist, typically at least 4-6 weeks.   EXERCISES  Results after joint replacement surgery are often greatly improved when you follow the exercise, range of motion and muscle strengthening exercises prescribed by your doctor. Safety measures are also important to protect the joint from further injury. Any time any of these exercises cause you to have increased pain or swelling, decrease what you are doing until you are comfortable again and then slowly increase them. If you have problems or questions, call your caregiver or physical therapist for advice.   Rehabilitation is important following a joint replacement. After just a few days of immobilization, the muscles of the leg can become weakened and shrink (atrophy).  These exercises are designed to build up the tone and strength of the thigh and leg muscles and to improve motion. Often times heat used for twenty to thirty minutes before working out will loosen up your tissues and help with improving the range of motion but do  not use heat for the first two weeks following surgery (sometimes heat can increase post-operative swelling).   These exercises can be done on a training (exercise) mat, on the floor, on a table or on a bed. Use whatever works the best and is most comfortable for you.    Use music or television while you are exercising so that the exercises are a pleasant break in your day. This will make your life better with the exercises acting as a break in your routine that you can look forward to.   Perform  all exercises about fifteen times, three times per day or as directed.  You should exercise both the operative leg and the other leg as well.  Exercises include:   Quad Sets - Tighten up the muscle on the front of the thigh (Quad) and hold for 5-10 seconds.   Straight Leg Raises - With your knee straight (if you were given a brace, keep it on), lift the leg to 60 degrees, hold for 3 seconds, and slowly lower the leg.  Perform this exercise against resistance later as your leg gets stronger.  Leg Slides: Lying on your back, slowly slide your foot toward your buttocks, bending your knee up off the floor (only go as far as is comfortable). Then slowly slide your foot back down until your leg is flat on the floor again.  Angel Wings: Lying on your back spread your legs to the side as far apart as you can without causing discomfort.  Hamstring Strength:  Lying on your back, push your heel against the floor with your leg straight by tightening up the muscles of your buttocks.  Repeat, but this time bend your knee to a comfortable angle, and push your heel against the floor.  You may put a pillow under the heel to make it more comfortable if necessary.   A rehabilitation program following joint replacement surgery can speed recovery and prevent re-injury in the future due to weakened muscles. Contact your doctor or a physical therapist for more information on knee rehabilitation.    CONSTIPATION  Constipation is defined medically as fewer than three stools per week and severe constipation as less than one stool per week.  Even if you have a regular bowel pattern at home, your normal regimen is likely to be disrupted due to multiple reasons following surgery.  Combination of anesthesia, postoperative narcotics, change in appetite and fluid intake all can affect your bowels.   YOU MUST use at least one of the following options; they are listed in order of increasing strength to get the job done.  They are  all available over the counter, and you may need to use some, POSSIBLY even all of these options:    Drink plenty of fluids (prune juice may be helpful) and high fiber foods Colace 100 mg by mouth twice a day  Senokot for constipation as directed and as needed Dulcolax (bisacodyl), take with full glass of water  Miralax (polyethylene glycol) once or twice a day as needed.  If you have tried all these things and are unable to have a bowel movement in the first 3-4 days after surgery call either your surgeon or your primary doctor.    If you experience loose stools or diarrhea, hold the medications until you stool forms back up.  If your symptoms do not get better within 1 week or if they get worse, check with your doctor.  If you experience "the worst abdominal pain  ever" or develop nausea or vomiting, please contact the office immediately for further recommendations for treatment.   ITCHING:  If you experience itching with your medications, try taking only a single pain pill, or even half a pain pill at a time.  You can also use Benadryl over the counter for itching or also to help with sleep.   TED HOSE STOCKINGS:  Use stockings on both legs until for at least 2 weeks or as directed by physician office. They may be removed at night for sleeping.  MEDICATIONS:  See your medication summary on the "After Visit Summary" that nursing will review with you.  You may have some home medications which will be placed on hold until you complete the course of blood thinner medication.  It is important for you to complete the blood thinner medication as prescribed.  PRECAUTIONS:  If you experience chest pain or shortness of breath - call 911 immediately for transfer to the hospital emergency department.   If you develop a fever greater that 101 F, purulent drainage from wound, increased redness or drainage from wound, foul odor from the wound/dressing, or calf pain - CONTACT YOUR SURGEON.                                                    FOLLOW-UP APPOINTMENTS:  If you do not already have a post-op appointment, please call the office for an appointment to be seen by your surgeon.  Guidelines for how soon to be seen are listed in your "After Visit Summary", but are typically between 1-4 weeks after surgery.  OTHER INSTRUCTIONS:  Dental Antibiotics:  In most cases prophylactic antibiotics for Dental procdeures after total joint surgery are not necessary.  Exceptions are as follows:  1. History of prior total joint infection  2. Severely immunocompromised (Organ Transplant, cancer chemotherapy, Rheumatoid biologic meds such as Hoffman)  3. Poorly controlled diabetes (A1C &gt; 8.0, blood glucose over 200)  If you have one of these conditions, contact your surgeon for an antibiotic prescription, prior to your dental procedure.   MAKE SURE YOU:  Understand these instructions.  Get help right away if you are not doing well or get worse.    Thank you for letting us be a part of your medical care team.  It is a privilege we respect greatly.  We hope these instructions will help you stay on track for a fast and full recovery!

## 2020-09-20 NOTE — Anesthesia Procedure Notes (Signed)
Procedure Name: MAC Date/Time: 09/20/2020 7:25 AM Performed by: Lissa Morales, CRNA Pre-anesthesia Checklist: Emergency Drugs available, Patient identified, Suction available and Patient being monitored Patient Re-evaluated:Patient Re-evaluated prior to induction Oxygen Delivery Method: Simple face mask Placement Confirmation: positive ETCO2

## 2020-09-20 NOTE — Interval H&P Note (Signed)
History and Physical Interval Note:  09/20/2020 7:07 AM  Wanda Green  has presented today for surgery, with the diagnosis of OA RIGHT KNEE.  The various methods of treatment have been discussed with the patient and family. After consideration of risks, benefits and other options for treatment, the patient has consented to  Procedure(s): TOTAL KNEE ARTHROPLASTY (Right) as a surgical intervention.  The patient's history has been reviewed, patient examined, no change in status, stable for surgery.  I have reviewed the patient's chart and labs.  Questions were answered to the patient's satisfaction.     [name]

## 2020-09-21 ENCOUNTER — Encounter (HOSPITAL_COMMUNITY): Payer: Self-pay | Admitting: Orthopedic Surgery

## 2020-09-22 ENCOUNTER — Emergency Department (HOSPITAL_COMMUNITY): Payer: PPO

## 2020-09-22 ENCOUNTER — Encounter (HOSPITAL_COMMUNITY): Payer: Self-pay | Admitting: Radiology

## 2020-09-22 ENCOUNTER — Observation Stay (HOSPITAL_COMMUNITY)
Admission: EM | Admit: 2020-09-22 | Discharge: 2020-09-23 | Disposition: A | Discharge status: Home / Self Care | Payer: PPO | Attending: Emergency Medicine | Admitting: Emergency Medicine

## 2020-09-22 ENCOUNTER — Other Ambulatory Visit: Payer: Self-pay

## 2020-09-22 ENCOUNTER — Encounter: Payer: Self-pay | Admitting: Physician Assistant

## 2020-09-22 DIAGNOSIS — R079 Chest pain, unspecified: Secondary | ICD-10-CM | POA: Diagnosis not present

## 2020-09-22 DIAGNOSIS — Z7982 Long term (current) use of aspirin: Secondary | ICD-10-CM | POA: Diagnosis not present

## 2020-09-22 DIAGNOSIS — K922 Gastrointestinal hemorrhage, unspecified: Secondary | ICD-10-CM | POA: Diagnosis not present

## 2020-09-22 DIAGNOSIS — G4733 Obstructive sleep apnea (adult) (pediatric): Secondary | ICD-10-CM | POA: Diagnosis present

## 2020-09-22 DIAGNOSIS — D509 Iron deficiency anemia, unspecified: Secondary | ICD-10-CM | POA: Diagnosis present

## 2020-09-22 DIAGNOSIS — C50911 Malignant neoplasm of unspecified site of right female breast: Secondary | ICD-10-CM | POA: Insufficient documentation

## 2020-09-22 DIAGNOSIS — Z853 Personal history of malignant neoplasm of breast: Secondary | ICD-10-CM

## 2020-09-22 DIAGNOSIS — Z8679 Personal history of other diseases of the circulatory system: Secondary | ICD-10-CM | POA: Insufficient documentation

## 2020-09-22 DIAGNOSIS — Z96653 Presence of artificial knee joint, bilateral: Secondary | ICD-10-CM | POA: Insufficient documentation

## 2020-09-22 DIAGNOSIS — Z87891 Personal history of nicotine dependence: Secondary | ICD-10-CM | POA: Diagnosis not present

## 2020-09-22 DIAGNOSIS — Z20822 Contact with and (suspected) exposure to covid-19: Secondary | ICD-10-CM | POA: Diagnosis not present

## 2020-09-22 DIAGNOSIS — E669 Obesity, unspecified: Secondary | ICD-10-CM | POA: Diagnosis present

## 2020-09-22 DIAGNOSIS — R0789 Other chest pain: Principal | ICD-10-CM | POA: Insufficient documentation

## 2020-09-22 DIAGNOSIS — R110 Nausea: Secondary | ICD-10-CM | POA: Diagnosis not present

## 2020-09-22 DIAGNOSIS — K559 Vascular disorder of intestine, unspecified: Secondary | ICD-10-CM

## 2020-09-22 DIAGNOSIS — Z79899 Other long term (current) drug therapy: Secondary | ICD-10-CM | POA: Insufficient documentation

## 2020-09-22 DIAGNOSIS — K449 Diaphragmatic hernia without obstruction or gangrene: Secondary | ICD-10-CM | POA: Diagnosis not present

## 2020-09-22 DIAGNOSIS — I517 Cardiomegaly: Secondary | ICD-10-CM | POA: Diagnosis not present

## 2020-09-22 DIAGNOSIS — I252 Old myocardial infarction: Secondary | ICD-10-CM

## 2020-09-22 DIAGNOSIS — J9811 Atelectasis: Secondary | ICD-10-CM | POA: Diagnosis not present

## 2020-09-22 DIAGNOSIS — D500 Iron deficiency anemia secondary to blood loss (chronic): Secondary | ICD-10-CM | POA: Diagnosis not present

## 2020-09-22 DIAGNOSIS — R7303 Prediabetes: Secondary | ICD-10-CM | POA: Diagnosis not present

## 2020-09-22 DIAGNOSIS — R0902 Hypoxemia: Secondary | ICD-10-CM | POA: Diagnosis not present

## 2020-09-22 DIAGNOSIS — R0602 Shortness of breath: Secondary | ICD-10-CM | POA: Diagnosis not present

## 2020-09-22 DIAGNOSIS — E66811 Obesity, class 1: Secondary | ICD-10-CM | POA: Diagnosis present

## 2020-09-22 DIAGNOSIS — I10 Essential (primary) hypertension: Secondary | ICD-10-CM | POA: Diagnosis not present

## 2020-09-22 DIAGNOSIS — I4891 Unspecified atrial fibrillation: Secondary | ICD-10-CM | POA: Diagnosis not present

## 2020-09-22 HISTORY — DX: Gastrointestinal hemorrhage, unspecified: K92.2

## 2020-09-22 LAB — HEPATIC FUNCTION PANEL
ALT: 17 U/L (ref 0–44)
AST: 19 U/L (ref 15–41)
Albumin: 3.4 g/dL — ABNORMAL LOW (ref 3.5–5.0)
Alkaline Phosphatase: 39 U/L (ref 38–126)
Bilirubin, Direct: 0.2 mg/dL (ref 0.0–0.2)
Indirect Bilirubin: 0.6 mg/dL (ref 0.3–0.9)
Total Bilirubin: 0.8 mg/dL (ref 0.3–1.2)
Total Protein: 6.4 g/dL — ABNORMAL LOW (ref 6.5–8.1)

## 2020-09-22 LAB — CBC
HCT: 24.4 % — ABNORMAL LOW (ref 36.0–46.0)
HCT: 26.3 % — ABNORMAL LOW (ref 36.0–46.0)
Hemoglobin: 8.1 g/dL — ABNORMAL LOW (ref 12.0–15.0)
Hemoglobin: 8.6 g/dL — ABNORMAL LOW (ref 12.0–15.0)
MCH: 32.9 pg (ref 26.0–34.0)
MCH: 32 pg (ref 26.0–34.0)
MCHC: 33.2 g/dL (ref 30.0–36.0)
MCHC: 32.7 g/dL (ref 30.0–36.0)
MCV: 99.2 fL (ref 80.0–100.0)
MCV: 97.8 fL (ref 80.0–100.0)
Platelets: 165 10*3/uL (ref 150–400)
Platelets: 173 10*3/uL (ref 150–400)
RBC: 2.46 MIL/uL — ABNORMAL LOW (ref 3.87–5.11)
RBC: 2.69 MIL/uL — ABNORMAL LOW (ref 3.87–5.11)
RDW: 13.8 % (ref 11.5–15.5)
RDW: 13.8 % (ref 11.5–15.5)
WBC: 11.1 10*3/uL — ABNORMAL HIGH (ref 4.0–10.5)
WBC: 10.2 10*3/uL (ref 4.0–10.5)
nRBC: 0 % (ref 0.0–0.2)
nRBC: 0 % (ref 0.0–0.2)

## 2020-09-22 LAB — FERRITIN: Ferritin: 147 ng/mL (ref 11–307)

## 2020-09-22 LAB — BASIC METABOLIC PANEL
Anion gap: 6 (ref 5–15)
BUN: 21 mg/dL (ref 8–23)
CO2: 24 mmol/L (ref 22–32)
Calcium: 9.1 mg/dL (ref 8.9–10.3)
Chloride: 107 mmol/L (ref 98–111)
Creatinine, Ser: 1.17 mg/dL — ABNORMAL HIGH (ref 0.44–1.00)
GFR, Estimated: 46 mL/min — ABNORMAL LOW (ref >60)
Glucose, Bld: 124 mg/dL — ABNORMAL HIGH (ref 70–99)
Potassium: 3.8 mmol/L (ref 3.5–5.1)
Sodium: 137 mmol/L (ref 135–145)

## 2020-09-22 LAB — LIPASE, BLOOD: Lipase: 25 U/L (ref 11–51)

## 2020-09-22 LAB — DIFFERENTIAL
Abs Immature Granulocytes: 0.05 10*3/uL (ref 0.00–0.07)
Basophils Absolute: 0 10*3/uL (ref 0.0–0.1)
Basophils Relative: 0 %
Eosinophils Absolute: 0 10*3/uL (ref 0.0–0.5)
Eosinophils Relative: 0 %
Immature Granulocytes: 1 %
Lymphocytes Relative: 8 %
Lymphs Abs: 0.9 10*3/uL (ref 0.7–4.0)
Monocytes Absolute: 1.3 10*3/uL — ABNORMAL HIGH (ref 0.1–1.0)
Monocytes Relative: 12 %
Neutro Abs: 8.9 10*3/uL — ABNORMAL HIGH (ref 1.7–7.7)
Neutrophils Relative %: 79 %

## 2020-09-22 LAB — RETICULOCYTES
Immature Retic Fract: 13.9 % (ref 2.3–15.9)
RBC.: 2.61 MIL/uL — ABNORMAL LOW (ref 3.87–5.11)
Retic Count, Absolute: 53.8 10*3/uL (ref 19.0–186.0)
Retic Ct Pct: 2.1 % (ref 0.4–3.1)

## 2020-09-22 LAB — IRON AND TIBC
Iron: 12 ug/dL — ABNORMAL LOW (ref 28–170)
Saturation Ratios: 5 % — ABNORMAL LOW (ref 10.4–31.8)
TIBC: 231 ug/dL — ABNORMAL LOW (ref 250–450)
UIBC: 219 ug/dL

## 2020-09-22 LAB — TROPONIN I (HIGH SENSITIVITY)
Troponin I (High Sensitivity): 11 ng/L (ref <18)
Troponin I (High Sensitivity): 11 ng/L (ref <18)

## 2020-09-22 LAB — VITAMIN B12: Vitamin B-12: 440 pg/mL (ref 180–914)

## 2020-09-22 LAB — POC OCCULT BLOOD, ED: Fecal Occult Bld: POSITIVE — AB

## 2020-09-22 LAB — FOLATE: Folate: 19.4 ng/mL (ref >5.9)

## 2020-09-22 MED ORDER — ACETAMINOPHEN 325 MG PO TABS
650.0000 mg | ORAL_TABLET | Freq: Four times a day (QID) | ORAL | Status: DC | PRN
  Administered 2020-09-23: 650 mg via ORAL
  Filled 2020-09-22: qty 2

## 2020-09-22 MED ORDER — SODIUM CHLORIDE 0.9 % IV SOLN: INTRAVENOUS | Status: DC

## 2020-09-22 MED ORDER — ONDANSETRON HCL 4 MG/2ML IJ SOLN: 4.0000 mg | Freq: Once | INTRAMUSCULAR | Status: DC

## 2020-09-22 MED ORDER — HYDROMORPHONE HCL 1 MG/ML IJ SOLN
0.5000 mg | Freq: Once | INTRAMUSCULAR | Status: AC
  Administered 2020-09-22: 0.5 mg via INTRAMUSCULAR
  Filled 2020-09-22: qty 1

## 2020-09-22 MED ORDER — METOPROLOL SUCCINATE ER 25 MG PO TB24
25.0000 mg | ORAL_TABLET | Freq: Every day | ORAL | Status: DC
  Administered 2020-09-23: 25 mg via ORAL
  Filled 2020-09-22: qty 1

## 2020-09-22 MED ORDER — FERROUS SULFATE 325 (65 FE) MG PO TABS
325.0000 mg | ORAL_TABLET | Freq: Every day | ORAL | Status: DC
  Administered 2020-09-23: 325 mg via ORAL
  Filled 2020-09-22: qty 1

## 2020-09-22 MED ORDER — LOSARTAN POTASSIUM 50 MG PO TABS
100.0000 mg | ORAL_TABLET | Freq: Every day | ORAL | Status: DC
  Administered 2020-09-23: 100 mg via ORAL
  Filled 2020-09-22: qty 2

## 2020-09-22 MED ORDER — METHOCARBAMOL 500 MG PO TABS
500.0000 mg | ORAL_TABLET | Freq: Four times a day (QID) | ORAL | Status: DC | PRN
  Administered 2020-09-23: 500 mg via ORAL
  Filled 2020-09-22: qty 1

## 2020-09-22 MED ORDER — LORAZEPAM 1 MG PO TABS
1.0000 mg | ORAL_TABLET | Freq: Once | ORAL | Status: AC
  Administered 2020-09-22: 1 mg via ORAL
  Filled 2020-09-22: qty 1

## 2020-09-22 MED ORDER — ATORVASTATIN CALCIUM 10 MG PO TABS
20.0000 mg | ORAL_TABLET | ORAL | Status: DC
  Administered 2020-09-23: 20 mg via ORAL
  Filled 2020-09-22: qty 2

## 2020-09-22 MED ORDER — TRAMADOL HCL 50 MG PO TABS
50.0000 mg | ORAL_TABLET | Freq: Four times a day (QID) | ORAL | Status: DC | PRN
  Administered 2020-09-23: 50 mg via ORAL
  Filled 2020-09-22: qty 1

## 2020-09-22 MED ORDER — METOPROLOL SUCCINATE ER 25 MG PO TB24
50.0000 mg | ORAL_TABLET | Freq: Every day | ORAL | Status: DC
  Administered 2020-09-23: 50 mg via ORAL
  Filled 2020-09-22: qty 2

## 2020-09-22 MED ORDER — ACETAMINOPHEN 650 MG RE SUPP: 650.0000 mg | Freq: Four times a day (QID) | RECTAL | Status: DC | PRN

## 2020-09-22 MED ORDER — HYDROMORPHONE HCL 1 MG/ML IJ SOLN: 0.5000 mg | Freq: Once | INTRAMUSCULAR | Status: DC

## 2020-09-22 MED ORDER — AMLODIPINE BESYLATE 5 MG PO TABS
2.5000 mg | ORAL_TABLET | Freq: Every day | ORAL | Status: DC
  Administered 2020-09-23: 2.5 mg via ORAL
  Filled 2020-09-22: qty 1

## 2020-09-22 MED ORDER — IOHEXOL 350 MG/ML SOLN
80.0000 mL | Freq: Once | INTRAVENOUS | Status: AC | PRN
  Administered 2020-09-22: 80 mL via INTRAVENOUS

## 2020-09-22 MED ORDER — ONDANSETRON 4 MG PO TBDP
4.0000 mg | ORAL_TABLET | Freq: Once | ORAL | Status: AC
  Administered 2020-09-22: 4 mg via ORAL
  Filled 2020-09-22: qty 1

## 2020-09-22 MED ORDER — METOPROLOL SUCCINATE ER 25 MG PO TB24: 25.0000 mg | ORAL_TABLET | ORAL | Status: DC

## 2020-09-22 MED ORDER — PANTOPRAZOLE SODIUM 40 MG IV SOLR
40.0000 mg | Freq: Two times a day (BID) | INTRAVENOUS | Status: DC
  Administered 2020-09-23 (×2): 40 mg via INTRAVENOUS
  Filled 2020-09-22 (×2): qty 40

## 2020-09-22 NOTE — ED Triage Notes (Signed)
Pt here via EMS with c/o sudden onset of CP with nausea . History of MI, pt reports feels same. Surgery on Tuesday. Has not been active.  SpO2 88% on EMS arrival. Placed on 2 liter [place]  Given 324 ASA 1X nitro with some relief.  112/63 HR 80 RR 18

## 2020-09-22 NOTE — ED Provider Notes (Signed)
[facility] EMERGENCY DEPARTMENT Provider Note   CSN: [id number] Arrival date & time: 09/22/20  0840     History Chief Complaint  Patient presents with   Chest Pain    Wanda Green is a 86 y.o. female.   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  Pain was sudden onset, at 0600 this morning, located midsternal with posterior radiation, sharp quality, resolved after NTG x1 and ASA 324 mg.  Did not have any improvement with omeprazole at home.  Has completely resolved at this point.  Associated symptoms include nausea and SOB (briefly at time of onset thinks a/w anxiety).  Similar to prior MI.  Noted to be satting in the upper 80s on arrival, started on 2 LPM [place].  Of note, discharge s/p knee replacement 2 days ago after which she states history required transfusion x2, although I was unable to find record of this.  Additionally notes that she has not had a bowel movement or pass gas is appreciated, although has not had any bowel distention or urge to pass gas.  She is currently on aspirin 162 mg, no other AP/AC.  No further medical concerns at this time including fevers, sweats, sore throat, rhinorrhea, cough, shortness of breath, palpitations, lower extremity edema, abdominal pain, urinary issues, focal paresthesias/weakness, headache, audiovisual change.  History obtained from patient and chart review.    Past Medical History:  Diagnosis Date   Cancer [facility]) 2000   right breast stage 2 chemo and radiation   Celiac artery stenosis (HCC)    followed by vascular--- dr Mamie Nick. levy ([facility] in [place])   Chronic rhinitis    Chronic venous insufficiency    Claustrophobia    "SEVERE"   Claustrophobia    Complication of anesthesia    Depression    Dysrhythmia 2016   GERD (gastroesophageal reflux disease)    GI  bleed 09/22/2020   Heart murmur    sees dr Terrence Dupont q 3 months mild murmur per pt   Hiatal hernia    History of adenomatous polyp of colon    History of atrial fibrillation    cardiologist--- dr Terrence Dupont---  first dx 2014,  per pt when at time of recurrent flare-up ischemic colitis in 2016 she was in normal rhythm   History of cancer chemotherapy 2000   for breast cancer   History of cervical dysplasia age 61   s/p  cervical cone bx   History of external beam radiation therapy 2000   right breast cancer   History of ischemic colitis    followed by dr Cristina Gong  (w/ Sadie Haber)  pt hx recurrent ischemic or infectious colitis   History of ischemic colitis    History of lower GI bleeding    History of right breast cancer oncologist--- dr Beryle Beams, [place]ell Clement in epic07-19-2016 no recurrence, released prn   dx 01/ 2000,  right breast cancer, Stage II, positive 2 nodes, ER+/  s/p  right breast lumpectomy w/ sln bx's in 2000;  completed chemo/ radiation in 2000   Hyperlipidemia    Hypertension    IDA (iron deficiency anemia) 2020   Myocardial infarction ([place]) 2016   Neuromuscular disorder ([place])    neropathy lt leg   OA (osteoarthritis)    knees   OSA on CPAP cpap set on 10   followed by  dr Elsworth Soho (pulmonology) study in epic 09-01-2012  milds osa   Osteoporosis 06/2016   T score -3.0   Peripheral vascular disease ([place])    vericose veins   PONV (postoperative nausea and vomiting)    Pre-diabetes    Pt denies   Right wrist fracture 04/22/2020    Patient Active Problem List   Diagnosis Date Noted   Malignant neoplasm of right breast, stage 2 ([place]) 09/22/2020   GI bleed 09/22/2020   History of atrial fibrillation 09/22/2020   Acute GI bleeding 09/22/2020   Cardiac murmur 06/21/2020   Acute cystitis 01/12/2020   Prediabetes 09/03/2019   Iron deficiency anemia 09/03/2019   Osteoarthritis of right knee 04/09/2017   Colitis, ischemic ([place]) 03/18/2017   Primary osteoarthritis of left knee  01/28/2017   History of heart attack 11/24/2014   Colitis 08/31/2014   Acute colitis 08/31/2014   Obesity (BMI 30.0-34.9) 03/02/2013   OSA (obstructive sleep apnea) 08/12/2012   Atrial fibrillation ([place]) 06/24/2012   HTN (hypertension) 06/24/2012   Other and unspecified hyperlipidemia 06/24/2012   Allergic rhinitis due to pollen 06/24/2012   Hay fever 06/24/2012   History of breast cancer 06/05/2011    Past Surgical History:  Procedure Laterality Date   BREAST BIOPSY Right 2000   BREAST LUMPECTOMY WITH AXILLARY LYMPH NODE BIOPSY Right 2000   CARDIAC CATHETERIZATION N/A 11/25/2014   Procedure: Left Heart Cath and Coronary Angiography;  Surgeon: Dixie Dials, MD;  Location: [place] [facility];  Service: Cardiovascular;  Laterality: N/A;   CATARACT EXTRACTION W/ INTRAOCULAR LENS  IMPLANT, BILATERAL Bilateral 2018   CERVICAL CONE BIOPSY  age 75   COLONOSCOPY WITH PROPOFOL N/A 09/01/2014   Procedure: COLONOSCOPY WITH PROPOFOL;  Surgeon: Ronald Lobo, MD;  Location: [facility] ENDOSCOPY;  Service: Endoscopy;  Laterality: N/A;  this will be done unprepped   KNEE ARTHROSCOPY Bilateral right 02/ 2003;  left 07/ 2005  '@[facility]'$    NASAL SINUS SURGERY  02/2013   including deviated septum repair   OPEN REDUCTION INTERNAL FIXATION (ORIF) DISTAL RADIAL FRACTURE Right 04/26/2020   Procedure: OPEN REDUCTION INTERNAL FIXATION (ORIF) RIGHT  DISTAL RADIAL FRACTURE;  Surgeon: [name], MD;  Location: [place];  Service: Orthopedics;  Laterality: Right;   ORIF WRIST FRACTURE Left 2001  '@[facility]'$    TOTAL KNEE ARTHROPLASTY Left 04/09/2017   Procedure: LEFT TOTAL KNEE ARTHROPLASTY;  Surgeon: [name], MD;  Location: [place];  Service: Orthopedics;  Laterality: Left;   TOTAL KNEE ARTHROPLASTY Right 09/20/2020   Procedure: TOTAL KNEE ARTHROPLASTY;  Surgeon: [name], MD;  Location: [facility];  Service: Orthopedics;  Laterality: Right;   TUBAL LIGATION Bilateral 1975   VARICOSE VEIN SURGERY   05/2019   WRIST FRACTURE SURGERY Left ~ 2006     OB History     Gravida  3   Para  3   Term  3   Preterm      AB      Living  2      SAB      IAB      Ectopic      Multiple      Live Births              Family History  Problem Relation Age of Onset   Hypertension Mother    Heart failure Mother    Heart disease Mother    Hypertension Father    Heart disease Father    Hypertension Sister  Uterine cancer Sister    Hypertension Brother    Heart disease Brother     Social History   Tobacco Use   Smoking status: Former    Packs/day: 0.10    Years: 3.00    Pack years: 0.30    Types: Cigarettes    Quit date: 03/05/1968    Years since quitting: 52.5   Smokeless tobacco: Never  Vaping Use   Vaping Use: Never used  Substance Use Topics   Alcohol use: No    Alcohol/week: 0.0 standard drinks   Drug use: No    Home Medications Prior to Admission medications   Medication Sig Start Date End Date Taking? Authorizing Provider  amLODipine (NORVASC) 2.5 MG tablet Take 2.5 mg by mouth daily. 11/15/14  Yes [provider]  aspirin EC 81 MG tablet Take 1 tablet (81 mg total) by mouth 2 (two) times daily. Swallow whole. 09/20/20 09/20/21 Yes Benedetto Goad, PA-C  atorvastatin (LIPITOR) 20 MG tablet Take 20 mg by mouth every Monday, Wednesday, and Friday.   Yes [provider]  ferrous sulfate 325 (65 FE) MG tablet Take 1 tablet (325 mg total) by mouth daily with breakfast. 11/01/18  Yes Vann, Jessica U, DO  loratadine (CLARITIN) 10 MG tablet Take 10 mg by mouth daily.   Yes [provider]  losartan (COZAAR) 100 MG tablet Take 100 mg by mouth daily.   Yes [provider]  methocarbamol (ROBAXIN) 500 MG tablet Take 1 tablet (500 mg total) by mouth every 6 (six) hours as needed for muscle spasms. 09/20/20  Yes Benedetto Goad, PA-C  metoprolol succinate (TOPROL-XL) 25 MG 24 hr tablet Take 25-50 mg by mouth See admin instructions. Take  '25mg'$  in the morning and '50mg'$  at night.   Yes [provider]  Multiple Vitamin (MULTIVITAMIN) capsule Take 1 capsule by mouth daily.   Yes [provider]  omeprazole (PRILOSEC) 20 MG capsule Take 20 mg by mouth 2 (two) times daily. 05/15/19  Yes [provider]  ondansetron (ZOFRAN) 4 MG tablet Take 1 tablet (4 mg total) by mouth daily as needed for nausea or vomiting. 09/20/20 09/20/21 Yes Benedetto Goad, PA-C  oxybutynin (DITROPAN) 5 MG tablet TAKE 1 TABLET BY MOUTH THREE TIMES A DAY Patient taking differently: Take 5 mg by mouth 3 (three) times daily. 12/22/19  Yes Joseph Pierini, MD  Probiotic Product (ALIGN PO) Take 1 capsule by mouth daily.   Yes [provider]  traMADol (ULTRAM) 50 MG tablet Take 1 tablet (50 mg total) by mouth every 6 (six) hours as needed for up to 7 days. 09/20/20 09/27/20 Yes Benedetto Goad, PA-C  vitamin C (ASCORBIC ACID) 500 MG tablet Take 500 mg by mouth daily.   Yes [provider]  HYDROcodone-acetaminophen (NORCO/VICODIN) 5-325 MG tablet Take 1 tablet by mouth every 4 (four) hours as needed for moderate pain. Patient not taking: Reported on 09/22/2020 09/20/20 09/20/21  Benedetto Goad, PA-C    Allergies    Atorvastatin, Codeine, Doxycycline, Hydrocodone, and Oxycodone-acetaminophen  Review of Systems   Review of Systems  Cardiovascular:  Positive for chest pain.  All other systems reviewed and are negative.  Physical Exam Updated Vital Signs BP (!) 139/91   Pulse (!) 101   Temp 98.6 F (37 C)   Resp 16   Ht '5\' 3"'$  (1.6 m)   Wt 72.6 kg   LMP 07/22/2015   SpO2 100%   BMI 28.34 kg/m  Physical Exam Vitals and nursing note reviewed.  Constitutional:      General: She is not in acute distress.    Appearance: She is well-developed.  HENT:     Head: Normocephalic and atraumatic.  Eyes:     Extraocular Movements: Extraocular movements intact.     Conjunctiva/sclera: Conjunctivae normal.     Pupils:  Pupils are equal, round, and reactive to light.  Cardiovascular:     Rate and Rhythm: Normal rate and regular rhythm.     Heart sounds: Heart sounds not distant. Murmur heard.  Pulmonary:     Effort: Pulmonary effort is normal. No respiratory distress.     Breath sounds: Normal breath sounds.  Abdominal:     Palpations: Abdomen is soft.     Tenderness: There is abdominal tenderness (rlq- minimal). There is no guarding or rebound.  Genitourinary:    Rectum: Guaiac result positive.     Comments: No hemorrhoid noted on rectal exam.  Stool appeared brown, nonmellitus, not grossly bloody Musculoskeletal:     Cervical back: Neck supple.     Right lower leg: Tenderness (over postop site, which was dressed) present. No edema.     Left lower leg: No tenderness. No edema.     Comments: RLE neurovascularly intact distally  Skin:    General: Skin is warm and dry.  Neurological:     General: No focal deficit present.     Mental Status: She is alert and oriented to person, place, and time.  Psychiatric:        Mood and Affect: Mood normal.        Behavior: Behavior normal.    ED Results / Procedures / Treatments   Labs (all labs ordered are listed, but only abnormal results are displayed) Labs Reviewed  BASIC METABOLIC PANEL - Abnormal; Notable for the following components:      Result Value   Glucose, Bld 124 (*)    Creatinine, Ser 1.17 (*)    GFR, Estimated 46 (*)    All other components within normal limits  CBC - Abnormal; Notable for the following components:   WBC 11.1 (*)    RBC 2.46 (*)    Hemoglobin 8.1 (*)    HCT 24.4 (*)    All other components within normal limits  HEPATIC FUNCTION PANEL - Abnormal; Notable for the following components:   Total Protein 6.4 (*)    Albumin 3.4 (*)    All other components within normal limits  DIFFERENTIAL - Abnormal; Notable for the following components:   Neutro Abs 8.9 (*)    Monocytes Absolute 1.3 (*)    All other components within  normal limits  CBC - Abnormal; Notable for the following components:   RBC 2.69 (*)    Hemoglobin 8.6 (*)    HCT 26.3 (*)    All other components within normal limits  IRON AND TIBC - Abnormal; Notable for the following components:   Iron 12 (*)    TIBC 231 (*)    Saturation Ratios 5 (*)    All other components within normal limits  RETICULOCYTES - Abnormal; Notable for the following components:   RBC. 2.61 (*)    All other components within normal limits  POC OCCULT BLOOD, ED - Abnormal; Notable for the following components:   Fecal Occult Bld POSITIVE (*)    All other components within normal limits  SARS CORONAVIRUS 2 (TAT 6-24 HRS)  LIPASE, BLOOD  VITAMIN B12  FOLATE  FERRITIN  CBC  BASIC METABOLIC PANEL  TROPONIN I (HIGH SENSITIVITY)  TROPONIN I (HIGH SENSITIVITY)    EKG EKG Interpretation  Date/Time:  Thursday September 22 2020 08:53:54 EDT Ventricular Rate:  76 PR Interval:  146 QRS Duration: 88 QT Interval:  382 QTC Calculation: 429 R Axis:   36 Text Interpretation: Normal sinus rhythm Normal ECG Confirmed by Madalyn Rob [phone]) on 09/22/2020 4:08:27 PM  Radiology DG Chest 2 View  Result Date: 09/22/2020 CLINICAL DATA:  Sudden onset chest pain with nausea EXAM: CHEST - 2 VIEW COMPARISON:  03/25/2016 FINDINGS: Normal heart size. Prominent ascending aortic contour which is likely rotational. Both lungs are clear. The visualized skeletal structures are unremarkable. Postoperative right axilla IMPRESSION: No active cardiopulmonary disease. Electronically Signed   By: Monte Fantasia M.D.   On: 09/22/2020 10:18   CT Angio Chest PE W and/or Wo Contrast  Result Date: 09/22/2020 CLINICAL DATA:  Chest pain and shortness of breath. Recent knee replacement. EXAM: CT ANGIOGRAPHY CHEST WITH CONTRAST TECHNIQUE: Multidetector CT imaging of the chest was performed using the standard protocol during bolus administration of intravenous contrast. Multiplanar CT image  reconstructions and MIPs were obtained to evaluate the vascular anatomy. CONTRAST:  27m OMNIPAQUE IOHEXOL 350 MG/ML SOLN COMPARISON:  Chest x-ray from same day. FINDINGS: Cardiovascular: Satisfactory opacification of the pulmonary arteries to the segmental level. No evidence of pulmonary embolism. Unchanged borderline cardiomegaly with mild left atrial enlargement. No pericardial effusion. No thoracic aortic aneurysm or dissection. Coronary, aortic arch, and branch vessel atherosclerotic vascular disease. Mediastinum/Nodes: No enlarged mediastinal, hilar, or axillary lymph nodes. Prior right axillary lymph node dissection. Thyroid gland, trachea, and esophagus demonstrate no significant findings. Lungs/Pleura: Mild subsegmental atelectasis in both lungs. No focal consolidation, pleural effusion, or pneumothorax. Upper Abdomen: No acute abnormality.  Small hiatal hernia. Musculoskeletal: No chest wall abnormality. No acute or significant osseous findings. Review of the MIP images confirms the above findings. IMPRESSION: 1. No evidence of pulmonary embolism. No acute intrathoracic process. 2. Aortic Atherosclerosis (ICD10-I70.0). Electronically Signed   By: WTitus DubinM.D.   On: 09/22/2020 12:41    Procedures Procedures   Medications Ordered in ED Medications  traMADol (ULTRAM) tablet 50 mg (has no administration in time range)  amLODipine (NORVASC) tablet 2.5 mg (has no administration in time range)  atorvastatin (LIPITOR) tablet 20 mg (has no administration in time range)  losartan (COZAAR) tablet 100 mg (has no administration in time range)  ferrous sulfate tablet 325 mg (has no administration in time range)  methocarbamol (ROBAXIN) tablet 500 mg (has no administration in time range)  0.9 %  sodium chloride infusion ( Intravenous New Bag/Given 09/23/20 0007)  acetaminophen (TYLENOL) tablet 650 mg (has no administration in time range)    Or  acetaminophen (TYLENOL) suppository 650 mg (has no  administration in time range)  pantoprazole (PROTONIX) injection 40 mg (40 mg Intravenous Given 09/23/20 0002)  metoprolol succinate (TOPROL-XL) 24 hr tablet 25 mg (has no administration in time range)    And  metoprolol succinate (TOPROL-XL) 24 hr tablet 50 mg (50 mg Oral Given 09/23/20 0003)  LORazepam (ATIVAN) tablet 1 mg (1 mg Oral Given 09/22/20 1119)  iohexol (OMNIPAQUE) 350 MG/ML injection 80 mL (80 mLs Intravenous Contrast Given 09/22/20 1200)  HYDROmorphone (DILAUDID) injection 0.5 mg (0.5 mg Intramuscular Given 09/22/20 1928)  ondansetron (ZOFRAN-ODT) disintegrating tablet 4 mg (4 mg Oral Given 09/22/20 1928)    ED Course  I have reviewed the triage vital signs and the nursing notes.  Pertinent labs & imaging results that were available during my care of the patient were reviewed by me and considered in my medical decision making (see chart for details).    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  On exam, tenderness over right knee surgery incision sites, as well as minimal RLQ tenderness inconsistent with acute abdomen.  She additionally notes that she has not had a bowel movement or flatus since her procedure 2 days ago, but is not felt any discomfort or urge.  Initial interventions: Patient was originally placed on nasal cannula, which was weaned off without any issues.  Dilaudid and Zofran given for pain or nausea with some improvement.  All studies independently reviewed by myself, d/w the attending physician, factored into my MDM. -EKG: NSR 76 bpm, normal axis, normal intervals, no acute ST-T changes; essentially unchanged compared to prior from 06/2020 -CBC: Hgb 8.1 (11.5 2 weeks ago), WBC 11.1 -Hemoccult  positive -Unremarkable: CXR, CTA PE study, BMP, troponin x2, LFTs, lipase  Presentation appears most consistent with noncardiac chest pain, and anemia in postoperative setting versus context of mild GI bleed.  Serial benign abdominal exams, suggests against severe intra-abdominal pathology such as appendicitis, volvulus, obstruction, torsion, ruptured cyst, perforation.  No recent injuries to suggest traumatic etiology.  RLE neurovascularly intact distally.  Discussed with GI (Dr. Michail Sermon), do not feel that patient requires admission from their standpoint.  Discussed with orthopedic surgery (Dr. Percell Miller), who recommend admission in context of the anemia.  Discussed with medicine team agreed to admit.  Plans discussed with patient and family understand and agree.  Patient HDS on reevaluation, nontoxic appearing.  Course of care subsequently transferred to admitting service.  Final Clinical Impression(s) / ED Diagnoses Final diagnoses:  Atypical chest pain  Iron deficiency anemia due to chronic blood loss    Rx / DC Orders ED Discharge Orders     None        Levin Bacon, MD 09/23/20 [id number]    Lucrezia Starch, MD 09/25/20 1017

## 2020-09-22 NOTE — H&P (Signed)
History and Physical    Wanda Green DOB: March 05, 1935 DOA: 09/22/2020  PCP: Lesleigh Noe, MD  Patient coming from: Home.  Chief Complaint: Chest pain.  HPI: Wanda Green is a 86 y.o. female with history of hypertension, hyperlipidemia history of ischemic colitis who has recently underwent right total knee replacement was discharged home 2 days ago started experiencing chest pain retrosternal burning sensation radiating to back since waking up this morning.  Denies any associated shortness of breath nausea vomiting or diarrhea.  Since surgery patient has been finding it difficult to ambulate.    Patient has had a unremarkable cardiac cath in 2016.  2D echo done in 2014 showed EF of 55 to 60% with grade 1 diastolic dysfunction.  Colonoscopy done in 8 by Dr. Cristina Gong showed features concerning for ischemic colitis.  Patient has a just noticed no blood in stools.  ED Course: In the ER patient chest pain resolved with pain medication.  CT angiogram of the chest was done which was negative for pulmonary embolism.  Patient's hemoglobin dropped from 11.5-8.1 in 2 weeks.  Stool for occult blood was positive.  ER physician discussed with Dr. Michail Sermon on-call gastroenterologist and also Dr. Percell Miller patient's surgeon who replaced the right knee.  Patient admitted for further observation.  COVID test is pending.  Review of Systems: As per HPI, rest all negative.   Past Medical History:  Diagnosis Date   Cancer [facility]) 2000   right breast stage 2 chemo and radiation   Celiac artery stenosis (HCC)    followed by vascular--- dr Mamie Nick. levy ([facility] in [place])   Chronic rhinitis    Chronic venous insufficiency    Claustrophobia    "SEVERE"   Claustrophobia    Complication of anesthesia    Depression    Dysrhythmia 2016   GERD (gastroesophageal reflux disease)    GI bleed 09/22/2020   Heart murmur    sees dr Terrence Dupont q 3 months mild murmur per pt   Hiatal hernia    History of  adenomatous polyp of colon    History of atrial fibrillation    cardiologist--- dr Terrence Dupont---  first dx 2014,  per pt when at time of recurrent flare-up ischemic colitis in 2016 she was in normal rhythm   History of cancer chemotherapy 2000   for breast cancer   History of cervical dysplasia age 20   s/p  cervical cone bx   History of external beam radiation therapy 2000   right breast cancer   History of ischemic colitis    followed by dr Cristina Gong  (w/ [facility]ie Haber)  pt hx recurrent ischemic or infectious colitis   History of ischemic colitis    History of lower GI bleeding    History of right breast cancer oncologist--- dr Beryle Beams, Cassell Clement in epic07-19-2016 no recurrence, released prn   dx 01/ 2000,  right breast cancer, Stage II, positive 2 nodes, ER+/  s/p  right breast lumpectomy w/ sln bx's in 2000;  completed chemo/ radiation in 2000   Hyperlipidemia    Hypertension    IDA (iron deficiency anemia) 2020   Myocardial infarction ([place]) 2016   Neuromuscular disorder ([place])    neropathy lt leg   OA (osteoarthritis)    knees   OSA on CPAP cpap set on 10   followed by dr Elsworth Soho (pulmonology) study in epic 09-01-2012  milds osa   Osteoporosis 06/2016   T score -3.0   Peripheral vascular disease ([place])  vericose veins   PONV (postoperative nausea and vomiting)    Pre-diabetes    Pt denies   Right wrist fracture 04/22/2020    Past Surgical History:  Procedure Laterality Date   BREAST BIOPSY Right 2000   BREAST LUMPECTOMY WITH AXILLARY LYMPH NODE BIOPSY Right 2000   CARDIAC CATHETERIZATION N/A 11/25/2014   Procedure: Left Heart Cath and Coronary Angiography;  Surgeon: Dixie Dials, MD;  Location: [facility];  Service: Cardiovascular;  Laterality: N/A;   CATARACT EXTRACTION W/ INTRAOCULAR LENS  IMPLANT, BILATERAL Bilateral 2018   CERVICAL CONE BIOPSY  age 18   COLONOSCOPY WITH PROPOFOL N/A 09/01/2014   Procedure: COLONOSCOPY WITH PROPOFOL;  Surgeon: Ronald Lobo, MD;  Location: [facility];  Service: Endoscopy;  Laterality: N/A;  this will be done unprepped   KNEE ARTHROSCOPY Bilateral right 02/ 2003;  left 07/ 2005  @[facility]    NASAL SINUS SURGERY  02/2013   including deviated septum repair   OPEN REDUCTION INTERNAL FIXATION (ORIF) DISTAL RADIAL FRACTURE Right 04/26/2020   Procedure: OPEN REDUCTION INTERNAL FIXATION (ORIF) RIGHT  DISTAL RADIAL FRACTURE;  Surgeon: [name], MD;  Location: [place];  Service: Orthopedics;  Laterality: Right;   ORIF WRIST FRACTURE Left 2001  @[facility]    TOTAL KNEE ARTHROPLASTY Left 04/09/2017   Procedure: LEFT TOTAL KNEE ARTHROPLASTY;  Surgeon: [name], MD;  Location: [place];  Service: Orthopedics;  Laterality: Left;   TOTAL KNEE ARTHROPLASTY Right 09/20/2020   Procedure: TOTAL KNEE ARTHROPLASTY;  Surgeon: [name], MD;  Location: [facility];  Service: Orthopedics;  Laterality: Right;   TUBAL LIGATION Bilateral 1975   VARICOSE VEIN SURGERY  05/2019   WRIST FRACTURE SURGERY Left ~ 2006     reports that she quit smoking about 52 years ago. Her smoking use included cigarettes. She has a 0.30 pack-year smoking history. She has never used smokeless tobacco. She reports that she does not drink alcohol and does not use drugs.  Allergies  Allergen Reactions   Atorvastatin Other (See Comments)    Muscle and leg myalgias   Codeine Nausea And Vomiting   Doxycycline Nausea And Vomiting   Hydrocodone Nausea And Vomiting   Oxycodone-Acetaminophen Nausea And Vomiting    Family History  Problem Relation Age of Onset   Hypertension Mother    Heart failure Mother    Heart disease Mother    Hypertension Father    Heart disease Father    Hypertension Sister    Uterine cancer Sister    Hypertension Brother    Heart disease Brother     Prior to Admission medications   Medication Sig Start Date End Date Taking? Authorizing Provider  amLODipine (NORVASC) 2.5 MG tablet Take 2.5 mg by mouth daily. 11/15/14  Yes  [provider]  aspirin EC 81 MG tablet Take 1 tablet (81 mg total) by mouth 2 (two) times daily. Swallow whole. 09/20/20 09/20/21 Yes Benedetto Goad, PA-C  atorvastatin (LIPITOR) 20 MG tablet Take 20 mg by mouth every Monday, Wednesday, and Friday.   Yes [provider]  ferrous sulfate 325 (65 FE) MG tablet Take 1 tablet (325 mg total) by mouth daily with breakfast. 11/01/18  Yes Vann, Jessica U, DO  loratadine (CLARITIN) 10 MG tablet Take 10 mg by mouth daily.   Yes [provider]  losartan (COZAAR) 100 MG tablet Take 100 mg by mouth daily.   Yes [provider]  methocarbamol (ROBAXIN) 500 MG tablet Take 1  tablet (500 mg total) by mouth every 6 (six) hours as needed for muscle spasms. 09/20/20  Yes Benedetto Goad, PA-C  metoprolol succinate (TOPROL-XL) 25 MG 24 hr tablet Take 25-50 mg by mouth See admin instructions. Take 25mg  in the morning and 50mg  at night.   Yes [provider]  Multiple Vitamin (MULTIVITAMIN) capsule Take 1 capsule by mouth daily.   Yes [provider]  omeprazole (PRILOSEC) 20 MG capsule Take 20 mg by mouth 2 (two) times daily. 05/15/19  Yes [provider]  ondansetron (ZOFRAN) 4 MG tablet Take 1 tablet (4 mg total) by mouth daily as needed for nausea or vomiting. 09/20/20 09/20/21 Yes Benedetto Goad, PA-C  oxybutynin (DITROPAN) 5 MG tablet TAKE 1 TABLET BY MOUTH THREE TIMES A DAY Patient taking differently: Take 5 mg by mouth 3 (three) times daily. 12/22/19  Yes Joseph Pierini, MD  Probiotic Product (ALIGN PO) Take 1 capsule by mouth daily.   Yes [provider]  traMADol (ULTRAM) 50 MG tablet Take 1 tablet (50 mg total) by mouth every 6 (six) hours as needed for up to 7 days. 09/20/20 09/27/20 Yes Benedetto Goad, PA-C  vitamin C (ASCORBIC ACID) 500 MG tablet Take 500 mg by mouth daily.   Yes [provider]  HYDROcodone-acetaminophen (NORCO/VICODIN) 5-325 MG tablet Take 1 tablet by mouth  every 4 (four) hours as needed for moderate pain. Patient not taking: Reported on 09/22/2020 09/20/20 09/20/21  Prudencio Burly    Physical Exam: Constitutional: Moderately built and nourished. Vitals:   09/22/20 1355 09/22/20 1655 09/22/20 1730 09/22/20 1756  BP: 127/70 (!) 131/59 (!) 141/72   Pulse: 79 (!) 103 (!) 107   Resp: 20 (!) 22 (!) 22   Temp:      TempSrc:      SpO2: 100% 93% 92%   Weight:    72.6 kg  Height:    5\' 3"  (1.6 m)   Eyes: Anicteric no pallor. ENMT: No discharge from the ears eyes nose and mouth. Neck: No mass felt.  No neck rigidity. Respiratory: No rhonchi or crepitations. Cardiovascular: S1-S2 heard. Abdomen: Soft nontender bowel sound present. Musculoskeletal: Right knee on Ace wrap. Skin: No rash.   Neurologic: Alert awake oriented to time place and person.  Moves all extremities. Psychiatric: Appears normal.  Normal affect.   Labs on Admission: I have personally reviewed following labs and imaging studies  CBC: Recent Labs  Lab 09/22/20 0902  WBC 11.1*  NEUTROABS 8.9*  HGB 8.1*  HCT 24.4*  MCV 99.2  PLT 154   Basic Metabolic Panel: Recent Labs  Lab 09/22/20 0902  NA 137  K 3.8  CL 107  CO2 24  GLUCOSE 124*  BUN 21  CREATININE 1.17*  CALCIUM 9.1   GFR: Estimated Creatinine Clearance: 34.8 mL/min (A) (by C-G formula based on SCr of 1.17 mg/dL (H)). Liver Function Tests: Recent Labs  Lab 09/22/20 0902  AST 19  ALT 17  ALKPHOS 39  BILITOT 0.8  PROT 6.4*  ALBUMIN 3.4*   Recent Labs  Lab 09/22/20 0902  LIPASE 25   No results for input(s): AMMONIA in the last 168 hours. Coagulation Profile: No results for input(s): INR, PROTIME in the last 168 hours. Cardiac Enzymes: No results for input(s): CKTOTAL, CKMB, CKMBINDEX, TROPONINI in the last 168 hours. BNP (last 3 results) No results for input(s): PROBNP in the last 8760 hours. HbA1C: No results for input(s): HGBA1C in the last 72 hours.  CBG: No results for  input(s): GLUCAP in the last 168 hours. Lipid Profile: No results for input(s): CHOL, HDL, LDLCALC, TRIG, CHOLHDL, LDLDIRECT in the last 72 hours. Thyroid Function Tests: No results for input(s): TSH, T4TOTAL, FREET4, T3FREE, THYROIDAB in the last 72 hours. Anemia Panel: No results for input(s): VITAMINB12, FOLATE, FERRITIN, TIBC, IRON, RETICCTPCT in the last 72 hours. Urine analysis:    Component Value Date/Time   COLORURINE STRAW (A) 10/30/2018 2349   APPEARANCEUR CLEAR 10/30/2018 2349   LABSPEC 1.006 10/30/2018 2349   PHURINE 5.0 10/30/2018 2349   GLUCOSEU NEGATIVE 10/30/2018 2349   HGBUR NEGATIVE 10/30/2018 2349   BILIRUBINUR Negative 01/12/2020 1607   KETONESUR NEGATIVE 10/30/2018 2349   PROTEINUR Positive (A) 01/12/2020 1607   PROTEINUR NEGATIVE 10/30/2018 2349   UROBILINOGEN 0.2 01/12/2020 1607   UROBILINOGEN 1.0 11/30/2014 0448   NITRITE Positive 01/12/2020 1607   NITRITE NEGATIVE 10/30/2018 2349   LEUKOCYTESUR Large (3+) (A) 01/12/2020 1607   LEUKOCYTESUR NEGATIVE 10/30/2018 2349   Sepsis Labs: @LABRCNTIP (procalcitonin:4,lacticidven:4) ) Recent Results (from the past 240 hour(s))  SARS CORONAVIRUS 2 (TAT 6-24 HRS) Nasopharyngeal Nasopharyngeal Swab     Status: None   Collection Time: 09/16/20  1:06 PM   Specimen: Nasopharyngeal Swab  Result Value Ref Range Status   SARS Coronavirus 2 NEGATIVE NEGATIVE Final    Comment: (NOTE) SARS-CoV-2 target nucleic acids are NOT DETECTED.  The SARS-CoV-2 RNA is generally detectable in upper and lower respiratory specimens during the acute phase of infection. Negative results do not preclude SARS-CoV-2 infection, do not rule out co-infections with other pathogens, and should not be used as the sole basis for treatment or other patient management decisions. Negative results must be combined with clinical observations, patient history, and epidemiological information. The expected result is Negative.  Fact Sheet for  Patients: SugarRoll.be  Fact Sheet for Healthcare Providers: https://www.woods-mathews.com/  This test is not yet approved or cleared by the [place] FDA and  has been authorized for detection and/or diagnosis of SARS-CoV-2 by FDA under an Emergency Use Authorization (EUA). This EUA will remain  in effect (meaning this test can be used) for the duration of the COVID-19 declaration under Se ction 564(b)(1) of the Act, 21 U.S.C. section 360bbb-3(b)(1), unless the authorization is terminated or revoked sooner.  Performed at [facility], [place] [address], [place], [place] [address]      Radiological Exams on Admission: DG Chest 2 View  Result Date: 09/22/2020 CLINICAL DATA:  Sudden onset chest pain with nausea EXAM: CHEST - 2 VIEW COMPARISON:  03/25/2016 FINDINGS: Normal heart size. Prominent ascending aortic contour which is likely rotational. Both lungs are clear. The visualized skeletal structures are unremarkable. Postoperative right axilla IMPRESSION: No active cardiopulmonary disease. Electronically Signed   By: Monte Fantasia M.D.   On: 09/22/2020 10:18   CT Angio Chest PE W and/or Wo Contrast  Result Date: 09/22/2020 CLINICAL DATA:  Chest pain and shortness of breath. Recent knee replacement. EXAM: CT ANGIOGRAPHY CHEST WITH CONTRAST TECHNIQUE: Multidetector CT imaging of the chest was performed using the standard protocol during bolus administration of intravenous contrast. Multiplanar CT image reconstructions and MIPs were obtained to evaluate the vascular anatomy. CONTRAST:  68mL OMNIPAQUE IOHEXOL 350 MG/ML SOLN COMPARISON:  Chest x-ray from same day. FINDINGS: Cardiovascular: Satisfactory opacification of the pulmonary arteries to the segmental level. No evidence of pulmonary embolism. Unchanged borderline cardiomegaly with mild left atrial enlargement. No pericardial effusion. No thoracic aortic aneurysm or dissection. Coronary,  aortic arch, and branch vessel atherosclerotic vascular disease. Mediastinum/Nodes: No enlarged mediastinal, hilar, or axillary lymph nodes. Prior right axillary lymph node dissection. Thyroid gland, trachea, and esophagus demonstrate no significant findings. Lungs/Pleura: Mild subsegmental atelectasis in both lungs. No focal consolidation, pleural effusion, or pneumothorax. Upper Abdomen: No acute abnormality.  Small hiatal hernia. Musculoskeletal: No chest wall abnormality. No acute or significant osseous findings. Review of the MIP images confirms the above findings. IMPRESSION: 1. No evidence of pulmonary embolism. No acute intrathoracic process. 2. Aortic Atherosclerosis (ICD10-I70.0). Electronically Signed   By: Titus Dubin M.D.   On: 09/22/2020 12:41    EKG: Independently reviewed.  Normal sinus rhythm.  Assessment/Plan Principal Problem:   Acute GI bleeding Active Problems:   History of breast cancer   Atrial fibrillation (HCC)   HTN (hypertension)   OSA (obstructive sleep apnea)   History of heart attack   Obesity (BMI 30.0-34.9)   Iron deficiency anemia   GI bleed    Acute GI bleeding -patient has not noticed any overt bleeding.  Stool for occult blood is positive and patient did have 3 g drop in hemoglobin.  Patient has had a history of ischemic colitis.  Patient is taking aspirin after surgery.  Denies any NSAID use.  For now we will keep patient on clear liquid diet and check serial CBC Protonix IV and follow GI consult.  Gently hydrate. Chest pain -chest pain-free at this time.  Given history of ischemic colitis and history of hypertension we will consult cardiology.  Patient is known to Dr. Terrence Dupont.  Troponins were negative.   Acute blood loss anemia could be from GI bleed and also recent surgery.  Follow CBC.  Patient has given consent if hemoglobin falls less than 7 for transfusion.  Check anemia panel. Hypertension on amlodipine and losartan and Eliquis. Chronic kidney  disease stage III creatinine appears to be at baseline. History of breast cancer. History of hyperlipidemia on statins.   DVT prophylaxis: SCDs. Code Status: Full code. Family Communication: Daughter at the bedside. Disposition Plan: Home. Consults called: Cardiology and GI.  Orthopedics. Admission status: Observation.   Rise Patience MD [facility] Pager [phone].  If 7PM-7AM, please contact night-coverage [email or web address] Password [facility]  09/22/2020, 8:44 PM

## 2020-09-22 NOTE — Discharge Instructions (Signed)
You were evaluated in the ER for your chest pain.  Our work-up suggested against any significant cardiovascular disease at this time.  However you were noted to have a new hemoglobin drop, with blood noted on our rectal examination.  We discussed with the GI team who did not feel that this required admission or any emergent interventions.  Therefore, we feel that you are safe for discharge home.  However, you should call your gastroenterologist as well as your cardiologist to set up an appointment for reevaluation and further management.  Please return to the ER for any worsening.

## 2020-09-22 NOTE — Consult Note (Signed)
Reason for Consult:recent left total knee by Dr Fredonia Highland Referring Physician: ER  TOYE ROUILLARD is an 86 y.o. female.  HPI: 86 yo female presented to the ER this am with chest pain and nausea follow total knee replacement by Dr Percell Miller 2 days ago on 09/20/2020.  She has baseline iron deficiency anemia and a history of a lower GI bleed in the past.  Last hemoccult a year ago was negative.  Today it is positive.  GI has been consulted.  Medicine is admitting to make sure her hemoglobin stabilizes, GI to address is HGB continues to decline.  Ortho to follow for therapy and postoperative orthopedic issues  Past Medical History:  Diagnosis Date   Cancer [facility]) 2000   right breast stage 2 chemo and radiation   Celiac artery stenosis (HCC)    followed by vascular--- dr Mamie Nick. [place]vy ([facility] in [place])   Chronic rhinitis    Chronic venous insufficiency    Claustrophobia    "SEVERE"   Claustrophobia    Complication of anesthesia    Depression    Dysrhythmia 2016   GERD (gastroesophageal reflux disease)    GI bleed 09/22/2020   Heart murmur    sees dr Terrence Dupont q 3 months mild murmur per pt   Hiatal hernia    History of adenomatous polyp of colon    History of atrial fibrillation    cardiologist--- dr Terrence Dupont---  first dx 2014,  per pt when at time of recurrent flare-up ischemic colitis in 2016 she was in normal rhythm   History of cancer chemotherapy 2000   for breast cancer   History of cervical dysplasia age 54   s/p  cervical cone bx   History of external beam radiation therapy 2000   right breast cancer   History of ischemic colitis    followed by dr Cristina Gong  (w/ Sadie Haber)  pt hx recurrent ischemic or infectious colitis   History of ischemic colitis    History of lower GI bleeding    History of right breast cancer oncologist--- dr Beryle Beams, Cassell Clement in epic07-19-2016 no recurrence, released prn   dx 01/ 2000,  right breast cancer, Stage II, positive 2 nodes, ER+/  s/p  right breast lumpectomy  w/ sln bx's in 2000;  completed chemo/ radiation in 2000   Hyperlipidemia    Hypertension    IDA (iron deficiency anemia) 2020   Myocardial infarction ([place]) 2016   Neuromuscular disorder ([place])    neropathy lt leg   OA (osteoarthritis)    knees   OSA on CPAP cpap set on 10   followed by dr Elsworth Soho (pulmonology) study in epic 09-01-2012  milds osa   Osteoporosis 06/2016   T score -3.0   Peripheral vascular disease ([place])    vericose veins   PONV (postoperative nausea and vomiting)    Pre-diabetes    Pt denies   Right wrist fracture 04/22/2020    Past Surgical History:  Procedure Laterality Date   BREAST BIOPSY Right 2000   BREAST LUMPECTOMY WITH AXILLARY LYMPH NODE BIOPSY Right 2000   CARDIAC CATHETERIZATION N/A 11/25/2014   Procedure: Left Heart Cath and Coronary Angiography;  Surgeon: Dixie Dials, MD;  Location: [facility];  Service: Cardiovascular;  Laterality: N/A;   CATARACT EXTRACTION W/ INTRAOCULAR LENS  IMPLANT, BILATERAL Bilateral 2018   CERVICAL CONE BIOPSY  age 68   COLONOSCOPY WITH PROPOFOL N/A 09/01/2014   Procedure: COLONOSCOPY WITH PROPOFOL;  Surgeon: Ronald Lobo, MD;  Location: [facility];  Service: Endoscopy;  Laterality: N/A;  this will be done unprepped   KNEE ARTHROSCOPY Bilateral right 02/ 2003;  left 07/ 2005  @[facility]    NASAL SINUS SURGERY  02/2013   including deviated septum repair   OPEN REDUCTION INTERNAL FIXATION (ORIF) DISTAL RADIAL FRACTURE Right 04/26/2020   Procedure: OPEN REDUCTION INTERNAL FIXATION (ORIF) RIGHT  DISTAL RADIAL FRACTURE;  Surgeon: [name], MD;  Location: [facility];  Service: Orthopedics;  Laterality: Right;   ORIF WRIST FRACTURE Left 2001  @[facility]    TOTAL KNEE ARTHROPLASTY Left 04/09/2017   Procedure: LEFT TOTAL KNEE ARTHROPLASTY;  Surgeon: [name], MD;  Location: [facility];  Service: Orthopedics;  Laterality: Left;   TOTAL KNEE ARTHROPLASTY Right 09/20/2020   Procedure: TOTAL KNEE ARTHROPLASTY;   Surgeon: [name], MD;  Location: [facility];  Service: Orthopedics;  Laterality: Right;   TUBAL LIGATION Bilateral 1975   VARICOSE VEIN SURGERY  05/2019   WRIST FRACTURE SURGERY Left ~ 2006    Family History  Problem Relation Age of Onset   Hypertension Mother    Heart failure Mother    Heart disease Mother    Hypertension Father    Heart disease Father    Hypertension Sister    Uterine cancer Sister    Hypertension Brother    Heart disease Brother     Social History:  reports that she quit smoking about 52 years ago. Her smoking use included cigarettes. She has a 0.30 pack-year smoking history. She has never used smokeless tobacco. She reports that she does not drink alcohol and does not use drugs.  Allergies:  Allergies  Allergen Reactions   Atorvastatin Other (See Comments)    Muscle and leg myalgias   Codeine Nausea And Vomiting   Doxycycline Nausea And Vomiting   Hydrocodone Nausea And Vomiting   Oxycodone-Acetaminophen Nausea And Vomiting    Medications: Prior to Admission:  No current facility-administered medications on file prior to encounter.   Current Outpatient Medications on File Prior to Encounter  Medication Sig Dispense Refill   amLODipine (NORVASC) 2.5 MG tablet Take 2.5 mg by mouth daily.  3   aspirin EC 81 MG tablet Take 1 tablet (81 mg total) by mouth 2 (two) times daily. Swallow whole. 60 tablet 1   atorvastatin (LIPITOR) 20 MG tablet Take 20 mg by mouth every Monday, Wednesday, and Friday.     ferrous sulfate 325 (65 FE) MG tablet Take 1 tablet (325 mg total) by mouth daily with breakfast. 30 tablet 1   loratadine (CLARITIN) 10 MG tablet Take 10 mg by mouth daily.     losartan (COZAAR) 100 MG tablet Take 100 mg by mouth daily.     methocarbamol (ROBAXIN) 500 MG tablet Take 1 tablet (500 mg total) by mouth every 6 (six) hours as needed for muscle spasms. 75 tablet 1   metoprolol succinate (TOPROL-XL) 25 MG 24 hr tablet Take 25-50 mg by mouth See  admin instructions. Take 25mg  in the morning and 50mg  at night.     Multiple Vitamin (MULTIVITAMIN) capsule Take 1 capsule by mouth daily.     omeprazole (PRILOSEC) 20 MG capsule Take 20 mg by mouth 2 (two) times daily.     ondansetron (ZOFRAN) 4 MG tablet Take 1 tablet (4 mg total) by mouth daily as needed for nausea or vomiting. 30 tablet 1   oxybutynin (DITROPAN) 5 MG tablet TAKE 1 TABLET BY MOUTH THREE TIMES A DAY (Patient  taking differently: Take 5 mg by mouth 3 (three) times daily.) 270 tablet 3   Probiotic Product (ALIGN PO) Take 1 capsule by mouth daily.     traMADol (ULTRAM) 50 MG tablet Take 1 tablet (50 mg total) by mouth every 6 (six) hours as needed for up to 7 days. 28 tablet 0   vitamin C (ASCORBIC ACID) 500 MG tablet Take 500 mg by mouth daily.     HYDROcodone-acetaminophen (NORCO/VICODIN) 5-325 MG tablet Take 1 tablet by mouth every 4 (four) hours as needed for moderate pain. (Patient not taking: Reported on 09/22/2020) 20 tablet 0     Results for orders placed or performed during the hospital encounter of 09/22/20 (from the past 48 hour(s))  Basic metabolic panel     Status: Abnormal   Collection Time: 09/22/20  9:02 AM  Result Value Ref Range   Sodium 137 135 - 145 mmol/L   Potassium 3.8 3.5 - 5.1 mmol/L   Chloride 107 98 - 111 mmol/L   CO2 24 22 - 32 mmol/L   Glucose, Bld 124 (H) 70 - 99 mg/dL    Comment: Glucose reference range applies only to samples taken after fasting for at least 8 hours.   BUN 21 8 - 23 mg/dL   Creatinine, Ser 1.17 (H) 0.44 - 1.00 mg/dL   Calcium 9.1 8.9 - 10.3 mg/dL   GFR, Estimated 46 (L) >60 mL/min    Comment: (NOTE) Calculated using the CKD-EPI Creatinine Equation (2021)    Anion gap 6 5 - 15    Comment: Performed at [facility] [address], [place], [place] [address]  CBC     Status: Abnormal   Collection Time: 09/22/20  9:02 AM  Result Value Ref Range   WBC 11.1 (H) 4.0 - 10.5 K/uL   RBC 2.46 (L) 3.87 - 5.11 MIL/uL    Hemoglobin 8.1 (L) 12.0 - 15.0 g/dL   HCT 24.4 (L) 36.0 - 46.0 %   MCV 99.2 80.0 - 100.0 fL   MCH 32.9 26.0 - 34.0 pg   MCHC 33.2 30.0 - 36.0 g/dL   RDW 13.8 11.5 - 15.5 %   Platelets 165 150 - 400 K/uL   nRBC 0.0 0.0 - 0.2 %    Comment: Performed at [facility], [place] [address] [place], [place] [address]  Troponin I (High Sensitivity)     Status: None   Collection Time: 09/22/20  9:02 AM  Result Value Ref Range   Troponin I (High Sensitivity) 11 <18 ng/L    Comment: (NOTE) Elevated high sensitivity troponin I (hsTnI) values and significant  changes across serial measurements may suggest ACS but many other  chronic and acute conditions are known to elevate hsTnI results.  Refer to the "Links" section for chest pain algorithms and additional  guidance. Performed at [facility], [place] [address], [place], [place] [address]   Hepatic function panel     Status: Abnormal   Collection Time: 09/22/20  9:02 AM  Result Value Ref Range   Total Protein 6.4 (L) 6.5 - 8.1 g/dL   Albumin 3.4 (L) 3.5 - 5.0 g/dL   AST 19 15 - 41 U/L   ALT 17 0 - 44 U/L   Alkaline Phosphatase 39 38 - 126 U/L   Total Bilirubin 0.8 0.3 - 1.2 mg/dL   Bilirubin, Direct 0.2 0.0 - 0.2 mg/dL   Indirect Bilirubin 0.6 0.3 - 0.9 mg/dL    Comment: Performed  at [facility], [place] [address], [place], [place] [address]  Differential     Status: Abnormal   Collection Time: 09/22/20  9:02 AM  Result Value Ref Range   Neutrophils Relative % 79 %   Neutro Abs 8.9 (H) 1.7 - 7.7 K/uL   Lymphocytes Relative 8 %   Lymphs Abs 0.9 0.7 - 4.0 K/uL   Monocytes Relative 12 %   Monocytes Absolute 1.3 (H) 0.1 - 1.0 K/uL   Eosinophils Relative 0 %   Eosinophils Absolute 0.0 0.0 - 0.5 K/uL   Basophils Relative 0 %   Basophils Absolute 0.0 0.0 - 0.1 K/uL   Immature Granulocytes 1 %   Abs Immature Granulocytes 0.05 0.00 - 0.07 K/uL    Comment: Performed at [facility] [address] [place], [place]  [address]  Lipase, blood     Status: None   Collection Time: 09/22/20  9:02 AM  Result Value Ref Range   Lipase 25 11 - 51 U/L    Comment: Performed at [facility] [address] [place], [place] [address]  Troponin I (High Sensitivity)     Status: None   Collection Time: 09/22/20  2:07 PM  Result Value Ref Range   Troponin I (High Sensitivity) 11 <18 ng/L    Comment: (NOTE) Elevated high sensitivity troponin I (hsTnI) values and significant  changes across serial measurements may suggest ACS but many other  chronic and acute conditions are known to elevate hsTnI results.  Refer to the "Links" section for chest pain algorithms and additional  guidance. Performed at [facility], [place] [address], [place], [place] [address]   POC occult blood, ED     Status: Abnormal   Collection Time: 09/22/20  5:07 PM  Result Value Ref Range   Fecal Occult Bld POSITIVE (A) NEGATIVE    DG Chest 2 View  Result Date: 09/22/2020 CLINICAL DATA:  Sudden onset chest pain with nausea EXAM: CHEST - 2 VIEW COMPARISON:  03/25/2016 FINDINGS: Normal heart size. Prominent ascending aortic contour which is likely rotational. Both lungs are clear. The visualized skeletal structures are unremarkable. Postoperative right axilla IMPRESSION: No active cardiopulmonary disease. Electronically Signed   By: Monte Fantasia M.D.   On: 09/22/2020 10:18   CT Angio Chest PE W and/or Wo Contrast  Result Date: 09/22/2020 CLINICAL DATA:  Chest pain and shortness of breath. Recent knee replacement. EXAM: CT ANGIOGRAPHY CHEST WITH CONTRAST TECHNIQUE: Multidetector CT imaging of the chest was performed using the standard protocol during bolus administration of intravenous contrast. Multiplanar CT image reconstructions and MIPs were obtained to evaluate the vascular anatomy. CONTRAST:  57mL OMNIPAQUE IOHEXOL 350 MG/ML SOLN COMPARISON:  Chest x-ray from same day. FINDINGS: Cardiovascular: Satisfactory opacification of the  pulmonary arteries to the segmental level. No evidence of pulmonary embolism. Unchanged borderline cardiomegaly with mild left atrial enlargement. No pericardial effusion. No thoracic aortic aneurysm or dissection. Coronary, aortic arch, and branch vessel atherosclerotic vascular disease. Mediastinum/Nodes: No enlarged mediastinal, hilar, or axillary lymph nodes. Prior right axillary lymph node dissection. Thyroid gland, trachea, and esophagus demonstrate no significant findings. Lungs/Pleura: Mild subsegmental atelectasis in both lungs. No focal consolidation, pleural effusion, or pneumothorax. Upper Abdomen: No acute abnormality.  Small hiatal hernia. Musculoskeletal: No chest wall abnormality. No acute or significant osseous findings. Review of the MIP images confirms the above findings. IMPRESSION: 1. No evidence of pulmonary embolism. No acute intrathoracic process. 2. Aortic Atherosclerosis (ICD10-I70.0). Electronically Signed  By: Titus Dubin M.D.   On: 09/22/2020 12:41    Review of Systems  Cardiovascular:  Positive for chest pain.  Musculoskeletal:  Positive for back pain, gait problem and joint swelling.  Blood pressure (!) 141/72, pulse (!) 107, temperature 98.5 F (36.9 C), temperature source Oral, resp. rate (!) 22, height 5\' 3"  (1.6 m), weight 72.6 kg, last menstrual period 07/22/2015, SpO2 92 %. Physical Exam Musculoskeletal:     Comments: Left knee expectantly swollen in post operative dressing.      Assessment Principal Problem:   GI bleed Active Problems:   History of breast cancer   Atrial fibrillation (HCC)   HTN (hypertension)   OSA (obstructive sleep apnea)   History of heart attack   Obesity (BMI 30.0-34.9)   Iron deficiency anemia  Plan: Medicine to admit and follow for GI bleed with underlying iron deficiency anemia, and post operative blood loss anemia.  Ortho will continue to follow.  Will order CPM and physical therapy when patient gets to the floor.  Will  see patient tomorrow.    Alohilani Levenhagen J Lee Kalt 09/22/2020, 7:52 PM

## 2020-09-22 NOTE — ED Provider Notes (Signed)
Emergency Medicine Provider Triage Evaluation Note  Wanda Green , a 86 y.o. female  was evaluated in triage.  Pt complains of sudden onset of chest pain approximately 600 this morning.  Pain was midsternal and radiated to her back.  Patient describes pain as sharp.  Patient had associated shortness of breath and nausea.  Patient reports the pain resolved after receiving 324 mg of aspirin and 1 sublingual nitro with EMS.  No relief of symptoms after taking omeprazole at home.  Patient has history of previous MI and states that pain today felt similar to past MI.  Patient states that she had knee replacement last week.  No  Review of Systems  Positive: Chest pain, shortness of breath, nausea Negative: Palpitations, abdominal pain  Physical Exam  BP (!) 113/59 (BP Location: Left Arm)   Pulse 73   Temp 98.3 F (36.8 C) (Oral)   Resp 16   LMP 07/22/2015   SpO2 100%  Gen:   Awake, no distress   Resp:  Normal effort, lungs clear to auscultation bilaterally MSK:   Moves extremities without difficulty  Other:  +2 radial pulse bilaterally  Medical Decision Making  Medically screening exam initiated at 9:16 AM.  Appropriate orders placed.  Wanda Green was informed that the remainder of the evaluation will be completed by another provider, this initial triage assessment does not replace that evaluation, and the importance of remaining in the ED until their evaluation is complete.  The patient appears stable so that the remainder of the work up may be completed by another provider.      Loni Beckwith, PA-C 09/22/20 [id number]    Tegeler, Gwenyth Allegra, MD 09/22/20 [phone]

## 2020-09-23 DIAGNOSIS — K922 Gastrointestinal hemorrhage, unspecified: Secondary | ICD-10-CM | POA: Diagnosis not present

## 2020-09-23 DIAGNOSIS — Z96651 Presence of right artificial knee joint: Secondary | ICD-10-CM | POA: Diagnosis not present

## 2020-09-23 DIAGNOSIS — M1711 Unilateral primary osteoarthritis, right knee: Secondary | ICD-10-CM | POA: Diagnosis not present

## 2020-09-23 LAB — CBC
HCT: 25.2 % — ABNORMAL LOW (ref 36.0–46.0)
Hemoglobin: 8.5 g/dL — ABNORMAL LOW (ref 12.0–15.0)
MCH: 32.6 pg (ref 26.0–34.0)
MCHC: 33.7 g/dL (ref 30.0–36.0)
MCV: 96.6 fL (ref 80.0–100.0)
Platelets: 161 10*3/uL (ref 150–400)
RBC: 2.61 MIL/uL — ABNORMAL LOW (ref 3.87–5.11)
RDW: 13.5 % (ref 11.5–15.5)
WBC: 9.3 10*3/uL (ref 4.0–10.5)
nRBC: 0 % (ref 0.0–0.2)

## 2020-09-23 LAB — BASIC METABOLIC PANEL
Anion gap: 8 (ref 5–15)
BUN: 13 mg/dL (ref 8–23)
CO2: 25 mmol/L (ref 22–32)
Calcium: 9 mg/dL (ref 8.9–10.3)
Chloride: 105 mmol/L (ref 98–111)
Creatinine, Ser: 1.01 mg/dL — ABNORMAL HIGH (ref 0.44–1.00)
GFR, Estimated: 55 mL/min — ABNORMAL LOW (ref >60)
Glucose, Bld: 105 mg/dL — ABNORMAL HIGH (ref 70–99)
Potassium: 3.5 mmol/L (ref 3.5–5.1)
Sodium: 138 mmol/L (ref 135–145)

## 2020-09-23 LAB — SARS CORONAVIRUS 2 (TAT 6-24 HRS): SARS Coronavirus 2: NEGATIVE

## 2020-09-23 MED ORDER — TRAMADOL HCL 50 MG PO TABS: 100.0000 mg | ORAL_TABLET | Freq: Four times a day (QID) | ORAL | 0 refills | Status: DC | PRN

## 2020-09-23 MED ORDER — SODIUM CHLORIDE 0.9 % IV SOLN
250.0000 mg | Freq: Every day | INTRAVENOUS | Status: DC
  Administered 2020-09-23: 250 mg via INTRAVENOUS
  Filled 2020-09-23: qty 20

## 2020-09-23 MED ORDER — SODIUM CHLORIDE 0.9 % IV SOLN: INTRAVENOUS | Status: DC

## 2020-09-23 MED ORDER — TRAMADOL HCL 50 MG PO TABS
100.0000 mg | ORAL_TABLET | Freq: Four times a day (QID) | ORAL | Status: DC | PRN
  Administered 2020-09-23: 100 mg via ORAL
  Filled 2020-09-23: qty 2

## 2020-09-23 NOTE — ED Notes (Signed)
Admitting at [facility]

## 2020-09-23 NOTE — Discharge Summary (Addendum)
Physician Discharge Summary  Wanda Green [id number] DOB: [birth year] DOA: 09/22/2020  PCP: Lesleigh Noe, MD  Admit date: 09/22/2020 Discharge date: 09/23/2020  Time spent: 37 minutes  Recommendations for Outpatient Follow-up:  Needs outpatient follow-up with primary care physician regarding screening interval etc. and possible need for consideration of scope?-This is unlikely Outpatient follow-up Dr. Percell Miller orthopedics for routine postop knee care Med changes this admission are increase in tramadol-this was called [facility] the [facility]  Discharge Diagnoses:  MAIN problem for hospitalization   Leg pain, difficulty ambulating and mild postoperative anemia and heme positive stool (on iron) without any concern for overt GI bleed  Please see below for itemized issues addressed in [place]- refer to other progress notes for clarity if needed  Discharge Condition: Fair  Diet recommendation: Heart healthy  Filed Weights   09/22/20 1756  Weight: 72.6 kg    History of present illness:  86 year old white female community dwelling A. fib diagnosed 2014 follows with Dr. Terrence Dupont Remote breast cancer OSA/CPAP ?  Ischemic colitis followed in the past by Dr. Cristina Gong of [facility]-last colonoscopy 09/02/2014 = left-sided colitis?  Ischemic Readmission 10/2018 with 3 bleed-transfused 2 units at that time and followed in the outpatient again [facility] Dr. Fredonia Highland  also sees PMR Dr. Thedore Mins for lumbar radiculopathy   Recent admission R TKR 09/20/2020 Dr. Percell Miller-?  2 units PRBC at that time-DC on 162 mg ASA readmit 7/21 to [facility] c SOB N, V, difficulty ambulating in addition to retrosternal chest pain with back radiation  ED work-up = stool occult blood positive hemoglobin down from 11-->8, CT chest negative for pulmonary embolism Rx Protonix IV [facility] consulted, troponins noted to be [facility] Course:  Expected blood loss anemia from total knee  repair Heme positive stool however is on iron -2 labs have stabilized over the course of the past 12 hours and remain at a hemoglobin of 8.5 and 8.6s- - Long discussion with Dr. Michail Sermon on telephone along with Dr. Percell Miller of orthopedics-this is expected postoperative blood loss anemia with a total knee repair and given no overt melena dark tarry stool nausea vomiting it would be relatively low yield to perform any type of colon evaluation  Expected blood loss anemia Iron and TSAT are low-given IV iron today can continue ferrous sulfate in the outpatient setting  Recent R total knee repair 7/19 - Augment tramadol to 100 every 6 continue Robaxin continue Tylenol for breakthrough - Continue aspirin 81 twice daily  A. fib, aortic stenosis, diastolic dysfunction  All stable continued on meds on discharge    Discharge Exam: Vitals:   09/23/20 0845 09/23/20 0900  BP: (!) 142/68 133/69  Pulse: 90 81  Resp:    Temp:    SpO2: 95% 91%    Subj on day of d/c   Alert coherent no distress EOMI NCAT no focal deficit she seems to be in some pain with some antalgia with moving the knee   General Exam on discharge  EOMI NCAT no focal deficit coherent moderate dentition  Chest clear no added sound no rales no rhonchi Abdomen soft no rebound no guarding Right knee is wrapped in a heavy dressing Neuro intact no focal deficit ROM intact but limited by pain  Discharge Instructions   Discharge Instructions     Diet - low sodium heart healthy   Complete by: As directed    Discharge instructions   Complete by: As directed    Follow postoperative  instructions as per Dr. Percell Miller continue aspirin at this time as per him continue your iron and we have not changed any of your other medications other than doubling up on your tramadol which will be given at 100 mg every 6 hours for pain You may take Tylenol as you were taking it at home for breakthrough pain please follow-up with your regular  specialist in the outpatient setting   Increase activity slowly   Complete by: As directed       Allergies as of 09/23/2020       Reactions   Atorvastatin Other (See Comments)   Muscle and leg myalgias   Codeine Nausea And Vomiting   Doxycycline Nausea And Vomiting   Hydrocodone Nausea And Vomiting   Oxycodone-acetaminophen Nausea And Vomiting        Medication List     STOP taking these medications    ALIGN PO   HYDROcodone-acetaminophen 5-325 MG tablet Commonly known as: NORCO/VICODIN   ondansetron 4 MG tablet Commonly known as: Zofran       TAKE these medications    amLODipine 2.5 MG tablet Commonly known as: NORVASC Take 2.5 mg by mouth daily.   aspirin EC 81 MG tablet Take 1 tablet (81 mg total) by mouth 2 (two) times daily. Swallow whole.   atorvastatin 20 MG tablet Commonly known as: LIPITOR Take 20 mg by mouth every Monday, Wednesday, and Friday.   ferrous sulfate 325 (65 FE) MG tablet Take 1 tablet (325 mg total) by mouth daily with breakfast.   loratadine 10 MG tablet Commonly known as: CLARITIN Take 10 mg by mouth daily.   losartan 100 MG tablet Commonly known as: COZAAR Take 100 mg by mouth daily.   methocarbamol 500 MG tablet Commonly known as: Robaxin Take 1 tablet (500 mg total) by mouth every 6 (six) hours as needed for muscle spasms.   metoprolol succinate 25 MG 24 hr tablet Commonly known as: TOPROL-XL Take 25-50 mg by mouth See admin instructions. Take '25mg'$  in the morning and '50mg'$  at night.   multivitamin capsule Take 1 capsule by mouth daily.   omeprazole 20 MG capsule Commonly known as: PRILOSEC Take 20 mg by mouth 2 (two) times daily.   oxybutynin 5 MG tablet Commonly known as: DITROPAN TAKE 1 TABLET BY MOUTH THREE TIMES A DAY   traMADol 50 MG tablet Commonly known as: Ultram Take 2 tablets (100 mg total) by mouth every 6 (six) hours as needed for severe pain. What changed:  how much to take reasons to take this    vitamin C 500 MG tablet Commonly known as: ASCORBIC ACID Take 500 mg by mouth daily.       Allergies  Allergen Reactions   Atorvastatin Other (See Comments)    Muscle and leg myalgias   Codeine Nausea And Vomiting   Doxycycline Nausea And Vomiting   Hydrocodone Nausea And Vomiting   Oxycodone-Acetaminophen Nausea And Vomiting    Follow-up Information     Lesleigh Noe, MD. Schedule an appointment as soon as possible for a visit in 2 days.   Specialty: Family Medicine Contact information: [place] [address] [phone]         Ronald Lobo, MD. Schedule an appointment as soon as possible for a visit in 2 days.   Specialty: Gastroenterology Contact information: [id number] [place] [address] [phone]         Dixie Dials, MD. Schedule an appointment  as soon as possible for a visit in 2 days.   Specialty: Cardiology Contact information: [place] [address] [phone]                  The results of significant diagnostics from this hospitalization (including imaging, microbiology, ancillary and laboratory) are listed below for reference.    Significant Diagnostic Studies: DG Chest 2 View  Result Date: 09/22/2020 CLINICAL DATA:  Sudden onset chest pain with nausea EXAM: CHEST - 2 VIEW COMPARISON:  03/25/2016 FINDINGS: Normal heart size. Prominent ascending aortic contour which is likely rotational. Both lungs are clear. The visualized skeletal structures are unremarkable. Postoperative right axilla IMPRESSION: No active cardiopulmonary disease. Electronically Signed   By: Monte Fantasia M.D.   On: 09/22/2020 10:18   DG Knee 1-2 Views Right  Result Date: 09/20/2020 CLINICAL DATA:  Status post right knee replacement EXAM: RIGHT KNEE - 2 VIEW COMPARISON:  None. FINDINGS: Right knee prosthesis is seen in satisfactory position. No acute bony or soft tissue abnormality is noted.  IMPRESSION: Status post right knee replacement Electronically Signed   By: Inez Catalina M.D.   On: 09/20/2020 13:11   CT Angio Chest PE W and/or Wo Contrast  Result Date: 09/22/2020 CLINICAL DATA:  Chest pain and shortness of breath. Recent knee replacement. EXAM: CT ANGIOGRAPHY CHEST WITH CONTRAST TECHNIQUE: Multidetector CT imaging of the chest was performed using the standard protocol during bolus administration of intravenous contrast. Multiplanar CT image reconstructions and MIPs were obtained to evaluate the vascular anatomy. CONTRAST:  72m OMNIPAQUE IOHEXOL 350 MG/ML SOLN COMPARISON:  Chest x-ray from same day. FINDINGS: Cardiovascular: Satisfactory opacification of the pulmonary arteries to the segmental level. No evidence of pulmonary embolism. Unchanged borderline cardiomegaly with mild left atrial enlargement. No pericardial effusion. No thoracic aortic aneurysm or dissection. Coronary, aortic arch, and branch vessel atherosclerotic vascular disease. Mediastinum/Nodes: No enlarged mediastinal, hilar, or axillary lymph nodes. Prior right axillary lymph node dissection. Thyroid gland, trachea, and esophagus demonstrate no significant findings. Lungs/Pleura: Mild subsegmental atelectasis in both lungs. No focal consolidation, pleural effusion, or pneumothorax. Upper Abdomen: No acute abnormality.  Small hiatal hernia. Musculoskeletal: No chest wall abnormality. No acute or significant osseous findings. Review of the MIP images confirms the above findings. IMPRESSION: 1. No evidence of pulmonary embolism. No acute intrathoracic process. 2. Aortic Atherosclerosis (ICD10-I70.0). Electronically Signed   By: WTitus DubinM.D.   On: 09/22/2020 12:41    Microbiology: Recent Results (from the past 240 hour(s))  SARS CORONAVIRUS 2 (TAT 6-24 HRS) Nasopharyngeal Nasopharyngeal Swab     Status: None   Collection Time: 09/16/20  1:06 PM   Specimen: Nasopharyngeal Swab  Result Value Ref Range Status   SARS  Coronavirus 2 NEGATIVE NEGATIVE Final    Comment: (NOTE) SARS-CoV-2 target nucleic acids are NOT DETECTED.  The SARS-CoV-2 RNA is generally detectable in upper and lower respiratory specimens during the acute phase of infection. Negative results do not preclude SARS-CoV-2 infection, do not rule out co-infections with other pathogens, and should not be used as the sole basis for treatment or other patient management decisions. Negative results must be combined with clinical observations, patient history, and epidemiological information. The expected result is Negative.  Fact Sheet for Patients: hSugarRoll.be Fact Sheet for Healthcare Providers: hhttps://www.woods-mathews.com/ This test is not yet approved or cleared by the [place] and  has been authorized for detection and/or diagnosis of SARS-CoV-2 by FDA under an Emergency Use  Authorization (EUA). This EUA will remain  in effect (meaning this test can be used) for the duration of the COVID-19 declaration under Se ction 564(b)(1) of the Act, 21 U.S.C. section 360bbb-3(b)(1), unless the authorization is terminated or revoked sooner.  Performed at [facility], [place] [address], [place], [place] [address]   SARS CORONAVIRUS 2 (TAT 6-24 HRS) Nasopharyngeal Nasopharyngeal Swab     Status: None   Collection Time: 09/22/20 10:55 PM   Specimen: Nasopharyngeal Swab  Result Value Ref Range Status   SARS Coronavirus 2 NEGATIVE NEGATIVE Final    Comment: (NOTE) SARS-CoV-2 target nucleic acids are NOT DETECTED.  The SARS-CoV-2 RNA is generally detectable in upper and lower respiratory specimens during the acute phase of infection. Negative results do not preclude SARS-CoV-2 infection, do not rule out co-infections with other pathogens, and should not be used as the sole basis for treatment or other patient management decisions. Negative results must be combined with clinical  observations, patient history, and epidemiological information. The expected result is Negative.  Fact Sheet for Patients: SugarRoll.be  Fact Sheet for Healthcare Providers: https://www.woods-mathews.com/  This test is not yet approved or cleared by the [place] FDA and  has been authorized for detection and/or diagnosis of SARS-CoV-2 by FDA under an Emergency Use Authorization (EUA). This EUA will remain  in effect (meaning this test can be used) for the duration of the COVID-19 declaration under Se ction 564(b)(1) of the Act, 21 U.S.C. section 360bbb-3(b)(1), unless the authorization is terminated or revoked sooner.  Performed at [facility], [place] [address] [place], [place] [address]      Labs: Basic Metabolic Panel: Recent Labs  Lab 09/22/20 0902 09/23/20 0346  NA 137 138  K 3.8 3.5  CL 107 105  CO2 24 25  GLUCOSE 124* 105*  BUN 21 13  CREATININE 1.17* 1.01*  CALCIUM 9.1 9.0   Liver Function Tests: Recent Labs  Lab 09/22/20 0902  AST 19  ALT 17  ALKPHOS 39  BILITOT 0.8  PROT 6.4*  ALBUMIN 3.4*   Recent Labs  Lab 09/22/20 0902  LIPASE 25   No results for input(s): AMMONIA in the last 168 hours. CBC: Recent Labs  Lab 09/22/20 0902 09/22/20 2226 09/23/20 0346  WBC 11.1* 10.2 9.3  NEUTROABS 8.9*  --   --   HGB 8.1* 8.6* 8.5*  HCT 24.4* 26.3* 25.2*  MCV 99.2 97.8 96.6  PLT 165 173 161   Cardiac Enzymes: No results for input(s): CKTOTAL, CKMB, CKMBINDEX, TROPONINI in the last 168 hours. BNP: BNP (last 3 results) No results for input(s): BNP in the last 8760 hours.  ProBNP (last 3 results) No results for input(s): PROBNP in the last 8760 hours.  CBG: No results for input(s): GLUCAP in the last 168 hours.     Signed:  Nita Sells MD   [facility] 09/23/2020, 10:39 AM

## 2020-09-23 NOTE — ED Notes (Signed)
PT working with pt.

## 2020-09-23 NOTE — Progress Notes (Addendum)
Physical Therapy Evaluation Patient Details Name: NARMEEN TENBRINK MRN: [id number] DOB: 11-30-1934 Today's Date: 09/23/2020   History of Present Illness  Pt is an 86yo female presenting to [facility] ED on 7/21 with chief complaint of chest pain. Found to have anemia likely from GI bleed and post op anemia. Pt is s/p recent R TKA on 7/19. CT imaging no significant findings. PMH: breast cancer, ischemic colitis, OSA w/CPAP, a fib, HTN, Anemia, CKDiii, PVD, and MI, L TKA 2019.  Clinical Impression  Pt presents with problem list above and impairments below. Pt tolerated treatment well. Required min to min guard A with use of RW. Educated about knee precautions, stair navigation with use of RW and HEP. Per PA (who was present during session) anticipate discharge home today. Should patient remain in hospital, we will continue to follow acutely to promote independence with functional mobility.    Follow Up Recommendations Follow surgeon's recommendation for DC plan and follow-up therapies;Supervision for mobility/OOB (resume HHPT)    Equipment Recommendations  None recommended by PT    Recommendations for Other Services       Precautions / Restrictions Precautions Precautions: Fall;Knee Precaution Booklet Issued: No Precaution Comments: Reviewed knee precuations with pt Restrictions Weight Bearing Restrictions: Yes RLE Weight Bearing: Weight bearing as tolerated      Mobility  Bed Mobility Overal bed mobility: Needs Assistance Bed Mobility: Supine to Sit;Sit to Supine     Supine to sit: Min assist Sit to supine: Supervision   General bed mobility comments: Pt required min assist for RLE movement secondary to pain    Transfers Overall transfer level: Needs assistance Equipment used: Rolling walker (2 wheeled) Transfers: Sit to/from Stand Sit to Stand: Min assist;+2 safety/equipment         General transfer comment: Cues for hand placement, RLE position under body, powering through  both LEs. Cues for upright posture. Min A for steadying  Ambulation/Gait Ambulation/Gait assistance: Min guard;+2 safety/equipment Gait Distance (Feet): 100 Feet Assistive device: Rolling walker (2 wheeled) Gait Pattern/deviations: Step-to pattern;Decreased stance time - right;Decreased stride length;Decreased weight shift to right;Narrow base of support;Step-through pattern Gait velocity: decreased   General Gait Details: Cues for step through pattern, R terminal knee extension in stance, increased weightbearing on R, proximity to device, turning towards non-operative limb for increased safety. Min guard for safety  Stairs         General stair comments: Verbally reviewed safe stair navigation using RW  Wheelchair Mobility    Modified Rankin (Stroke Patients Only)       Balance Overall balance assessment: Needs assistance Sitting-balance support: Feet supported Sitting balance-Leahy Scale: Fair     Standing balance support: Bilateral upper extremity supported Standing balance-Leahy Scale: Poor Standing balance comment: Reliant on RW during standing                             Pertinent Vitals/Pain Pain Assessment: 0-10 Pain Score: 10-Worst pain ever Pain Location: right knee with movement Pain Descriptors / Indicators: Aching;Grimacing;Guarding Pain Intervention(s): Limited activity within patient's tolerance;Monitored during session;Premedicated before session;Repositioned (Per nursing tylenol and muscle relaxer)    Home Living Family/patient expects to be discharged to:: Private residence Living Arrangements: Children Available Help at Discharge: Family;Available 24 hours/day Type of Home: House Home Access: Stairs to enter Entrance Stairs-Rails: None Entrance Stairs-Number of Steps: 3 Home Layout: Two level;Able to live on main level with bedroom/bathroom Home Equipment: Gilford Rile - 2 wheels;Bedside commode  Prior Function Level of Independence:  Needs assistance   Gait / Transfers Assistance Needed: Pt reports trouble walking after surgery "unable to get up". Required assist to ambulate using RW.  ADL's / Homemaking Assistance Needed: trouble getting to bathroom and bathing. Daughter having to assist        Hand Dominance        Extremity/Trunk Assessment   Upper Extremity Assessment Upper Extremity Assessment: Overall WFL for tasks assessed    Lower Extremity Assessment Lower Extremity Assessment: RLE deficits/detail RLE Deficits / Details: R knee flexion to approximately 60deg and lacking terminal knee extension per visual inspection, generalized weakness secondary to pain    Cervical / Trunk Assessment Cervical / Trunk Assessment: Kyphotic  Communication   Communication: No difficulties  Cognition Arousal/Alertness: Awake/alert Behavior During Therapy: WFL for tasks assessed/performed Overall Cognitive Status: Within Functional Limits for tasks assessed                                        General Comments General comments (skin integrity, edema, etc.): Pt's daughter in room    Exercises Total Joint Exercises Ankle Circles/Pumps: AROM;Right;10 reps;Supine Quad Sets: Right;5 reps;Supine;AROM (Manual cues (hand under knee) for knee extension) Heel Slides: AAROM;Right;10 reps;Supine;Limitations Heel Slides Limitations: Pt reported pain which limited knee flexion   Assessment/Plan    PT Assessment Patient needs continued PT services  PT Problem List Decreased strength;Decreased range of motion;Decreased activity tolerance;Decreased balance;Decreased mobility;Decreased knowledge of precautions;Pain;Decreased coordination       PT Treatment Interventions DME instruction;Gait training;Stair training;Functional mobility training;Therapeutic activities;Therapeutic exercise;Balance training;Patient/family education    PT Goals (Current goals can be found in the Care Plan section)  Acute Rehab  PT Goals Patient Stated Goal: to decrease pain PT Goal Formulation: With patient/family Time For Goal Achievement: 10/07/20 Potential to Achieve Goals: Good    Frequency Min 3X/week   Barriers to discharge        Co-evaluation               AM-PAC PT "6 Clicks" Mobility  Outcome Measure Help needed turning from your back to your side while in a flat bed without using bedrails?: A Little Help needed moving from lying on your back to sitting on the side of a flat bed without using bedrails?: A Little Help needed moving to and from a bed to a chair (including a wheelchair)?: A Little Help needed standing up from a chair using your arms (e.g., wheelchair or bedside chair)?: A Little Help needed to walk in hospital room?: A Little Help needed climbing 3-5 steps with a railing? : A Lot 6 Click Score: 17    End of Session Equipment Utilized During Treatment: Gait belt Activity Tolerance: Patient tolerated treatment well Patient left: in bed;with call bell/phone within reach;with family/visitor present (on stretcher in ED) Nurse Communication: Mobility status PT Visit Diagnosis: Other abnormalities of gait and mobility (R26.89);Difficulty in walking, not elsewhere classified (R26.2);Pain Pain - Right/Left: Right Pain - part of body: Knee    Time: [id number] PT Time Calculation (min) (ACUTE ONLY): 26 min   Charges:   PT Evaluation $PT Eval Low Complexity: 1 Low PT Treatments $Gait Training: 8-22 mins        Dawayne Cirri, SPT   Dawayne Cirri 09/23/2020, 11:11 AM

## 2020-09-23 NOTE — ED Notes (Signed)
Pt alert, NAD, calm, interactive, resps e/u, speaking in clear complete sentences, VSS. C/o R knee pain, and general back pain from stretcher. Denies CP, sob, NV, dizziness. Breakfast eaten. Daughter at [facility]. Pending PT eval and inpt bed assignment.

## 2020-09-24 DIAGNOSIS — I739 Peripheral vascular disease, unspecified: Secondary | ICD-10-CM | POA: Diagnosis not present

## 2020-09-24 DIAGNOSIS — I252 Old myocardial infarction: Secondary | ICD-10-CM | POA: Diagnosis not present

## 2020-09-24 DIAGNOSIS — K219 Gastro-esophageal reflux disease without esophagitis: Secondary | ICD-10-CM | POA: Diagnosis not present

## 2020-09-24 DIAGNOSIS — R7303 Prediabetes: Secondary | ICD-10-CM | POA: Diagnosis not present

## 2020-09-24 DIAGNOSIS — I4891 Unspecified atrial fibrillation: Secondary | ICD-10-CM | POA: Diagnosis not present

## 2020-09-24 DIAGNOSIS — Z96653 Presence of artificial knee joint, bilateral: Secondary | ICD-10-CM | POA: Diagnosis not present

## 2020-09-24 DIAGNOSIS — Z471 Aftercare following joint replacement surgery: Secondary | ICD-10-CM | POA: Diagnosis not present

## 2020-09-24 DIAGNOSIS — E669 Obesity, unspecified: Secondary | ICD-10-CM | POA: Diagnosis not present

## 2020-09-24 DIAGNOSIS — Z87891 Personal history of nicotine dependence: Secondary | ICD-10-CM | POA: Diagnosis not present

## 2020-09-24 DIAGNOSIS — Z6830 Body mass index (BMI) 30.0-30.9, adult: Secondary | ICD-10-CM | POA: Diagnosis not present

## 2020-09-24 DIAGNOSIS — K449 Diaphragmatic hernia without obstruction or gangrene: Secondary | ICD-10-CM | POA: Diagnosis not present

## 2020-09-24 DIAGNOSIS — E785 Hyperlipidemia, unspecified: Secondary | ICD-10-CM | POA: Diagnosis not present

## 2020-09-24 DIAGNOSIS — D509 Iron deficiency anemia, unspecified: Secondary | ICD-10-CM | POA: Diagnosis not present

## 2020-09-24 DIAGNOSIS — Z7982 Long term (current) use of aspirin: Secondary | ICD-10-CM | POA: Diagnosis not present

## 2020-09-24 DIAGNOSIS — F32A Depression, unspecified: Secondary | ICD-10-CM | POA: Diagnosis not present

## 2020-09-24 DIAGNOSIS — C50911 Malignant neoplasm of unspecified site of right female breast: Secondary | ICD-10-CM | POA: Diagnosis not present

## 2020-09-24 DIAGNOSIS — M810 Age-related osteoporosis without current pathological fracture: Secondary | ICD-10-CM | POA: Diagnosis not present

## 2020-09-24 DIAGNOSIS — G4733 Obstructive sleep apnea (adult) (pediatric): Secondary | ICD-10-CM | POA: Diagnosis not present

## 2020-09-24 DIAGNOSIS — J310 Chronic rhinitis: Secondary | ICD-10-CM | POA: Diagnosis not present

## 2020-09-24 DIAGNOSIS — I872 Venous insufficiency (chronic) (peripheral): Secondary | ICD-10-CM | POA: Diagnosis not present

## 2020-09-27 ENCOUNTER — Other Ambulatory Visit: Payer: Self-pay

## 2020-09-27 ENCOUNTER — Ambulatory Visit
Admission: RE | Admit: 2020-09-27 | Discharge: 2020-09-27 | Disposition: A | Discharge status: Home / Self Care | Payer: PPO | Source: Ambulatory Visit | Attending: Orthopedic Surgery | Admitting: Orthopedic Surgery

## 2020-09-27 ENCOUNTER — Other Ambulatory Visit: Payer: Self-pay | Admitting: Orthopedic Surgery

## 2020-09-27 DIAGNOSIS — R609 Edema, unspecified: Secondary | ICD-10-CM | POA: Diagnosis not present

## 2020-09-27 DIAGNOSIS — M1711 Unilateral primary osteoarthritis, right knee: Secondary | ICD-10-CM | POA: Diagnosis not present

## 2020-09-27 DIAGNOSIS — M7989 Other specified soft tissue disorders: Secondary | ICD-10-CM | POA: Diagnosis not present

## 2020-10-05 ENCOUNTER — Telehealth: Payer: Self-pay | Admitting: Family Medicine

## 2020-10-05 DIAGNOSIS — M1711 Unilateral primary osteoarthritis, right knee: Secondary | ICD-10-CM | POA: Diagnosis not present

## 2020-10-05 DIAGNOSIS — M25561 Pain in right knee: Secondary | ICD-10-CM | POA: Diagnosis not present

## 2020-10-05 DIAGNOSIS — M6281 Muscle weakness (generalized): Secondary | ICD-10-CM | POA: Diagnosis not present

## 2020-10-05 DIAGNOSIS — M25661 Stiffness of right knee, not elsewhere classified: Secondary | ICD-10-CM | POA: Diagnosis not present

## 2020-10-05 NOTE — Chronic Care Management (AMB) (Signed)
  Chronic Care Management   Outreach Note  10/05/2020 Name: MIMMIE ELVIN MRN: [id number] DOB: February 23, 1935  Referred by: Lesleigh Noe, MD Reason for referral : No chief complaint on file.   An unsuccessful telephone outreach was attempted today. The patient was referred to the pharmacist for assistance with care management and care coordination.   Follow Up Plan:   Tatjana Dellinger Upstream Scheduler

## 2020-10-12 ENCOUNTER — Telehealth: Payer: Self-pay | Admitting: Family Medicine

## 2020-10-12 NOTE — Progress Notes (Signed)
  Chronic Care Management   Outreach Note  10/12/2020 Name: [name] MRN: [id number] DOB: [birth year]  Referred by: Lesleigh Noe, MD Reason for referral : No chief complaint on file.   A second unsuccessful telephone outreach was attempted today. The patient was referred to pharmacist for assistance with care management and care coordination.  Follow Up Plan:   Tatjana Dellinger Upstream Scheduler

## 2020-10-17 DIAGNOSIS — M25661 Stiffness of right knee, not elsewhere classified: Secondary | ICD-10-CM | POA: Diagnosis not present

## 2020-10-17 DIAGNOSIS — M1711 Unilateral primary osteoarthritis, right knee: Secondary | ICD-10-CM | POA: Diagnosis not present

## 2020-10-17 DIAGNOSIS — M6281 Muscle weakness (generalized): Secondary | ICD-10-CM | POA: Diagnosis not present

## 2020-10-17 DIAGNOSIS — M25561 Pain in right knee: Secondary | ICD-10-CM | POA: Diagnosis not present

## 2020-10-19 ENCOUNTER — Telehealth: Payer: Self-pay | Admitting: Family Medicine

## 2020-10-19 NOTE — Chronic Care Management (AMB) (Signed)
  Chronic Care Management   Outreach Note  10/19/2020 Name: Wanda Green MRN: [id number] DOB: 06-07-1934  Referred by: Lesleigh Noe, MD Reason for referral : No chief complaint on file.   Third unsuccessful telephone outreach was attempted today. The patient was referred to the pharmacist for assistance with care management and care coordination.   Follow Up Plan:   Tatjana Dellinger Upstream Scheduler

## 2020-10-20 DIAGNOSIS — M25561 Pain in right knee: Secondary | ICD-10-CM | POA: Diagnosis not present

## 2020-10-20 DIAGNOSIS — M6281 Muscle weakness (generalized): Secondary | ICD-10-CM | POA: Diagnosis not present

## 2020-10-20 DIAGNOSIS — M1711 Unilateral primary osteoarthritis, right knee: Secondary | ICD-10-CM | POA: Diagnosis not present

## 2020-10-20 DIAGNOSIS — M25661 Stiffness of right knee, not elsewhere classified: Secondary | ICD-10-CM | POA: Diagnosis not present

## 2020-10-24 DIAGNOSIS — M25561 Pain in right knee: Secondary | ICD-10-CM | POA: Diagnosis not present

## 2020-10-24 DIAGNOSIS — M25661 Stiffness of right knee, not elsewhere classified: Secondary | ICD-10-CM | POA: Diagnosis not present

## 2020-10-24 DIAGNOSIS — M1711 Unilateral primary osteoarthritis, right knee: Secondary | ICD-10-CM | POA: Diagnosis not present

## 2020-10-24 DIAGNOSIS — M6281 Muscle weakness (generalized): Secondary | ICD-10-CM | POA: Diagnosis not present

## 2020-10-25 ENCOUNTER — Ambulatory Visit (INDEPENDENT_AMBULATORY_CARE_PROVIDER_SITE_OTHER): Payer: PPO | Admitting: Family Medicine

## 2020-10-25 ENCOUNTER — Other Ambulatory Visit: Payer: Self-pay

## 2020-10-25 VITALS — BP 118/70 | HR 87 | Temp 97.9°F | Ht 63.5 in | Wt 159.5 lb

## 2020-10-25 DIAGNOSIS — Z789 Other specified health status: Secondary | ICD-10-CM | POA: Diagnosis not present

## 2020-10-25 DIAGNOSIS — K449 Diaphragmatic hernia without obstruction or gangrene: Secondary | ICD-10-CM | POA: Insufficient documentation

## 2020-10-25 DIAGNOSIS — N3281 Overactive bladder: Secondary | ICD-10-CM | POA: Diagnosis not present

## 2020-10-25 DIAGNOSIS — D509 Iron deficiency anemia, unspecified: Secondary | ICD-10-CM | POA: Diagnosis not present

## 2020-10-25 DIAGNOSIS — D62 Acute posthemorrhagic anemia: Secondary | ICD-10-CM

## 2020-10-25 MED ORDER — OXYBUTYNIN CHLORIDE 5 MG PO TABS: 5.0000 mg | ORAL_TABLET | Freq: Three times a day (TID) | ORAL | 3 refills | Status: DC

## 2020-10-25 MED ORDER — OMEPRAZOLE 20 MG PO CPDR: 20.0000 mg | DELAYED_RELEASE_CAPSULE | Freq: Two times a day (BID) | ORAL | 3 refills | Status: DC

## 2020-10-25 NOTE — Assessment & Plan Note (Signed)
Stable. Pt was advised to do bid omeprazole. Will continue dose. If anemia remaining stable may consider decreasing to once daily

## 2020-10-25 NOTE — Patient Instructions (Signed)
Monitor for blood in the stool or for new fatigue, fast heart rate, or breathing difficulty

## 2020-10-25 NOTE — Assessment & Plan Note (Addendum)
Reviewed hospital stay - likely etiology 2/2 to surgery but also blood in stool. Denies current symptoms. Recheck labs. Discussed blood in stool. If anemia improving would recommend continuing iron. If worsening would recommend GI referral (previous has retired) to discuss further work-up options - may be limited/conservative due to age and this was discussed. Cont iron. F/u labs

## 2020-10-25 NOTE — Progress Notes (Signed)
Subjective:     Wanda Green is a 86 y.o. female presenting for Hospitalization Follow-up (Knee replacement and then ED for chest pain )     HPI  #Knee replacement - the first few weeks were bad - was taking tramadol but has been off for this for a few week - had nausea/vomiting/diarrhea  #Anemia - no dark or tarry stools - though taking iron pills - no blood in stool - energy is good - no sob, tachycardia - all her doctors are retiring - GI told her she didn't need any more colonoscopies   #CP has resolved - no further episodes since hospital visit  Review of Systems  7/21-7/22/2022: Admission - Heme positive stool and difficulty ambulating - Anemia - Hgb 11>8. Expected anemia from total knee, stable labs. Treated with IV Iron. Increased tramadol for knee pain  Social History   Tobacco Use  Smoking Status Former   Packs/day: 0.10   Years: 3.00   Pack years: 0.30   Types: Cigarettes   Quit date: 03/05/1968   Years since quitting: 52.6  Smokeless Tobacco Never        Objective:    BP Readings from Last 3 Encounters:  10/25/20 118/70  09/23/20 (!) 142/66  09/20/20 129/69   Wt Readings from Last 3 Encounters:  10/25/20 159 lb 8 oz (72.3 kg)  09/22/20 160 lb (72.6 kg)  09/20/20 166 lb 9.6 oz (75.6 kg)    BP 118/70   Pulse 87   Temp 97.9 F (36.6 C) (Temporal)   Ht 5' 3.5" (1.613 m)   Wt 159 lb 8 oz (72.3 kg)   LMP 07/22/2015   SpO2 100%   BMI 27.81 kg/m    Physical Exam Constitutional:      General: She is not in acute distress.    Appearance: She is well-developed. She is not diaphoretic.  HENT:     Right Ear: External ear normal.     Left Ear: External ear normal.  Eyes:     Comments: Conjunctival pallor  Cardiovascular:     Rate and Rhythm: Normal rate and regular rhythm.     Heart sounds: Murmur heard.  Pulmonary:     Effort: Pulmonary effort is normal. No respiratory distress.     Breath sounds: Normal breath sounds. No wheezing.   Musculoskeletal:     Cervical back: Neck supple.  Skin:    General: Skin is warm and dry.     Capillary Refill: Capillary refill takes less than 2 seconds.  Neurological:     Mental Status: She is alert. Mental status is at baseline.  Psychiatric:        Mood and Affect: Mood normal.        Behavior: Behavior normal.          Assessment & Plan:   Problem List Items Addressed This Visit       Genitourinary   Overactive bladder    Symptoms well controlled. Cont oxybutynin 5 mg tid      Relevant Medications   oxybutynin (DITROPAN) 5 MG tablet     Other   Iron deficiency anemia    Reviewed hospital stay - likely etiology 2/2 to surgery but also blood in stool. Denies current symptoms. Recheck labs. Discussed blood in stool. If anemia improving would recommend continuing iron. If worsening would recommend GI referral (previous has retired) to discuss further work-up options - may be limited/conservative due to age and this was discussed. Cont iron.  F/u labs      Hiatal hernia    Stable. Pt was advised to do bid omeprazole. Will continue dose. If anemia remaining stable may consider decreasing to once daily      Relevant Medications   omeprazole (PRILOSEC) 20 MG capsule   Other Visit Diagnoses     Acute blood loss anemia    -  Primary   Relevant Orders   CBC   Ferritin   IBC panel   Unknown status of immunity to COVID-19 virus       Relevant Orders   SARS-CoV-2 Antibodies        Return in about 1 year (around 10/25/2021) for for annual exam or sooner as needed.  Lesleigh Noe, MD  This visit occurred during the SARS-CoV-2 public health emergency.  Safety protocols were in place, including screening questions prior to the visit, additional usage of staff PPE, and extensive cleaning of exam room while observing appropriate contact time as indicated for disinfecting solutions.

## 2020-10-25 NOTE — Assessment & Plan Note (Signed)
Symptoms well controlled. Cont oxybutynin 5 mg tid

## 2020-10-26 DIAGNOSIS — G4733 Obstructive sleep apnea (adult) (pediatric): Secondary | ICD-10-CM | POA: Diagnosis not present

## 2020-10-26 LAB — CBC
HCT: 31.7 % — ABNORMAL LOW (ref 36.0–46.0)
Hemoglobin: 10.4 g/dL — ABNORMAL LOW (ref 12.0–15.0)
MCHC: 32.9 g/dL (ref 30.0–36.0)
MCV: 97.7 fl (ref 78.0–100.0)
Platelets: 253 10*3/uL (ref 150.0–400.0)
RBC: 3.24 Mil/uL — ABNORMAL LOW (ref 3.87–5.11)
RDW: 14.4 % (ref 11.5–15.5)
WBC: 8 10*3/uL (ref 4.0–10.5)

## 2020-10-26 LAB — FERRITIN: Ferritin: 107.1 ng/mL (ref 10.0–291.0)

## 2020-10-26 LAB — IBC PANEL
Iron: 38 ug/dL — ABNORMAL LOW (ref 42–145)
Saturation Ratios: 14.4 % — ABNORMAL LOW (ref 20.0–50.0)
TIBC: 264.6 ug/dL (ref 250.0–450.0)
Transferrin: 189 mg/dL — ABNORMAL LOW (ref 212.0–360.0)

## 2020-10-26 LAB — SARS-COV-2 ANTIBODIES: SARS-CoV-2 Antibodies: NEGATIVE

## 2020-10-27 DIAGNOSIS — M1711 Unilateral primary osteoarthritis, right knee: Secondary | ICD-10-CM | POA: Diagnosis not present

## 2020-10-27 DIAGNOSIS — M25661 Stiffness of right knee, not elsewhere classified: Secondary | ICD-10-CM | POA: Diagnosis not present

## 2020-10-27 DIAGNOSIS — M6281 Muscle weakness (generalized): Secondary | ICD-10-CM | POA: Diagnosis not present

## 2020-10-27 DIAGNOSIS — M25561 Pain in right knee: Secondary | ICD-10-CM | POA: Diagnosis not present

## 2020-11-01 DIAGNOSIS — M6281 Muscle weakness (generalized): Secondary | ICD-10-CM | POA: Diagnosis not present

## 2020-11-01 DIAGNOSIS — M1711 Unilateral primary osteoarthritis, right knee: Secondary | ICD-10-CM | POA: Diagnosis not present

## 2020-11-01 DIAGNOSIS — M25561 Pain in right knee: Secondary | ICD-10-CM | POA: Diagnosis not present

## 2020-11-01 DIAGNOSIS — M25661 Stiffness of right knee, not elsewhere classified: Secondary | ICD-10-CM | POA: Diagnosis not present

## 2020-11-04 DIAGNOSIS — M25661 Stiffness of right knee, not elsewhere classified: Secondary | ICD-10-CM | POA: Diagnosis not present

## 2020-11-04 DIAGNOSIS — M25561 Pain in right knee: Secondary | ICD-10-CM | POA: Diagnosis not present

## 2020-11-04 DIAGNOSIS — M1711 Unilateral primary osteoarthritis, right knee: Secondary | ICD-10-CM | POA: Diagnosis not present

## 2020-11-04 DIAGNOSIS — M6281 Muscle weakness (generalized): Secondary | ICD-10-CM | POA: Diagnosis not present

## 2020-11-10 DIAGNOSIS — M25661 Stiffness of right knee, not elsewhere classified: Secondary | ICD-10-CM | POA: Diagnosis not present

## 2020-11-10 DIAGNOSIS — M1711 Unilateral primary osteoarthritis, right knee: Secondary | ICD-10-CM | POA: Diagnosis not present

## 2020-11-10 DIAGNOSIS — M25561 Pain in right knee: Secondary | ICD-10-CM | POA: Diagnosis not present

## 2020-11-10 DIAGNOSIS — M6281 Muscle weakness (generalized): Secondary | ICD-10-CM | POA: Diagnosis not present

## 2020-11-16 DIAGNOSIS — M1711 Unilateral primary osteoarthritis, right knee: Secondary | ICD-10-CM | POA: Diagnosis not present

## 2020-11-16 DIAGNOSIS — M6281 Muscle weakness (generalized): Secondary | ICD-10-CM | POA: Diagnosis not present

## 2020-11-16 DIAGNOSIS — M25661 Stiffness of right knee, not elsewhere classified: Secondary | ICD-10-CM | POA: Diagnosis not present

## 2020-11-16 DIAGNOSIS — M25561 Pain in right knee: Secondary | ICD-10-CM | POA: Diagnosis not present

## 2020-11-25 DIAGNOSIS — E785 Hyperlipidemia, unspecified: Secondary | ICD-10-CM | POA: Diagnosis not present

## 2020-11-25 DIAGNOSIS — I10 Essential (primary) hypertension: Secondary | ICD-10-CM | POA: Diagnosis not present

## 2020-11-25 DIAGNOSIS — I2510 Atherosclerotic heart disease of native coronary artery without angina pectoris: Secondary | ICD-10-CM | POA: Diagnosis not present

## 2020-11-25 DIAGNOSIS — I480 Paroxysmal atrial fibrillation: Secondary | ICD-10-CM | POA: Diagnosis not present

## 2020-11-29 DIAGNOSIS — M25661 Stiffness of right knee, not elsewhere classified: Secondary | ICD-10-CM | POA: Diagnosis not present

## 2020-11-29 DIAGNOSIS — M1711 Unilateral primary osteoarthritis, right knee: Secondary | ICD-10-CM | POA: Diagnosis not present

## 2020-11-29 DIAGNOSIS — M25561 Pain in right knee: Secondary | ICD-10-CM | POA: Diagnosis not present

## 2020-11-29 DIAGNOSIS — M6281 Muscle weakness (generalized): Secondary | ICD-10-CM | POA: Diagnosis not present

## 2020-12-02 DIAGNOSIS — M6281 Muscle weakness (generalized): Secondary | ICD-10-CM | POA: Diagnosis not present

## 2020-12-02 DIAGNOSIS — M25561 Pain in right knee: Secondary | ICD-10-CM | POA: Diagnosis not present

## 2020-12-02 DIAGNOSIS — M1711 Unilateral primary osteoarthritis, right knee: Secondary | ICD-10-CM | POA: Diagnosis not present

## 2020-12-02 DIAGNOSIS — M25661 Stiffness of right knee, not elsewhere classified: Secondary | ICD-10-CM | POA: Diagnosis not present

## 2020-12-06 DIAGNOSIS — M25661 Stiffness of right knee, not elsewhere classified: Secondary | ICD-10-CM | POA: Diagnosis not present

## 2020-12-06 DIAGNOSIS — M25561 Pain in right knee: Secondary | ICD-10-CM | POA: Diagnosis not present

## 2020-12-06 DIAGNOSIS — M6281 Muscle weakness (generalized): Secondary | ICD-10-CM | POA: Diagnosis not present

## 2020-12-06 DIAGNOSIS — M1711 Unilateral primary osteoarthritis, right knee: Secondary | ICD-10-CM | POA: Diagnosis not present

## 2020-12-16 DIAGNOSIS — M1711 Unilateral primary osteoarthritis, right knee: Secondary | ICD-10-CM | POA: Diagnosis not present

## 2020-12-16 DIAGNOSIS — M6281 Muscle weakness (generalized): Secondary | ICD-10-CM | POA: Diagnosis not present

## 2020-12-16 DIAGNOSIS — M25661 Stiffness of right knee, not elsewhere classified: Secondary | ICD-10-CM | POA: Diagnosis not present

## 2020-12-26 DIAGNOSIS — M1711 Unilateral primary osteoarthritis, right knee: Secondary | ICD-10-CM | POA: Diagnosis not present

## 2021-01-04 DIAGNOSIS — R42 Dizziness and giddiness: Secondary | ICD-10-CM | POA: Diagnosis not present

## 2021-01-06 DIAGNOSIS — I10 Essential (primary) hypertension: Secondary | ICD-10-CM | POA: Diagnosis not present

## 2021-01-06 DIAGNOSIS — E785 Hyperlipidemia, unspecified: Secondary | ICD-10-CM | POA: Diagnosis not present

## 2021-01-06 DIAGNOSIS — D649 Anemia, unspecified: Secondary | ICD-10-CM | POA: Diagnosis not present

## 2021-02-09 DIAGNOSIS — Z1231 Encounter for screening mammogram for malignant neoplasm of breast: Secondary | ICD-10-CM | POA: Diagnosis not present

## 2021-02-09 LAB — HM MAMMOGRAPHY

## 2021-02-10 ENCOUNTER — Encounter: Payer: Self-pay | Admitting: Obstetrics & Gynecology

## 2021-02-13 ENCOUNTER — Encounter: Payer: Self-pay | Admitting: Family Medicine

## 2021-02-15 DIAGNOSIS — M545 Low back pain, unspecified: Secondary | ICD-10-CM | POA: Diagnosis not present

## 2021-02-20 DIAGNOSIS — M5416 Radiculopathy, lumbar region: Secondary | ICD-10-CM | POA: Diagnosis not present

## 2021-03-13 DIAGNOSIS — M5416 Radiculopathy, lumbar region: Secondary | ICD-10-CM | POA: Diagnosis not present

## 2021-03-21 DIAGNOSIS — M5416 Radiculopathy, lumbar region: Secondary | ICD-10-CM | POA: Diagnosis not present

## 2021-04-06 DIAGNOSIS — M5416 Radiculopathy, lumbar region: Secondary | ICD-10-CM | POA: Diagnosis not present

## 2021-04-11 DIAGNOSIS — I10 Essential (primary) hypertension: Secondary | ICD-10-CM | POA: Diagnosis not present

## 2021-04-11 DIAGNOSIS — E785 Hyperlipidemia, unspecified: Secondary | ICD-10-CM | POA: Diagnosis not present

## 2021-04-11 DIAGNOSIS — D649 Anemia, unspecified: Secondary | ICD-10-CM | POA: Diagnosis not present

## 2021-04-11 DIAGNOSIS — I2510 Atherosclerotic heart disease of native coronary artery without angina pectoris: Secondary | ICD-10-CM | POA: Diagnosis not present

## 2021-04-11 DIAGNOSIS — K219 Gastro-esophageal reflux disease without esophagitis: Secondary | ICD-10-CM | POA: Diagnosis not present

## 2021-04-21 DIAGNOSIS — G4733 Obstructive sleep apnea (adult) (pediatric): Secondary | ICD-10-CM | POA: Diagnosis not present

## 2021-06-07 ENCOUNTER — Other Ambulatory Visit: Payer: Self-pay | Admitting: Family Medicine

## 2021-06-07 NOTE — Telephone Encounter (Signed)
Please verify the current dose.  ?

## 2021-06-12 ENCOUNTER — Telehealth: Payer: Self-pay

## 2021-06-12 NOTE — Telephone Encounter (Signed)
Tried to call patien tconfirm what dosage of metoprolol you are taking? I got a refill request for it but it doesn't look like Dr. Einar Pheasant has ever filled it so we just want to make sure we fill the right dose.  ?  ?

## 2021-06-15 ENCOUNTER — Ambulatory Visit: Payer: PPO | Admitting: Adult Health

## 2021-06-20 ENCOUNTER — Ambulatory Visit: Payer: PPO | Admitting: Adult Health

## 2021-06-20 ENCOUNTER — Encounter: Payer: Self-pay | Admitting: Adult Health

## 2021-06-20 DIAGNOSIS — G4733 Obstructive sleep apnea (adult) (pediatric): Secondary | ICD-10-CM | POA: Diagnosis not present

## 2021-06-20 NOTE — Progress Notes (Signed)
? ?'@Patient'$  ID: Wanda Green, female    DOB: May 26, 1934, 87 y.o.   MRN: [id number] ? ?Chief Complaint  ?Patient presents with  ? Follow-up  ? ? ?Referring provider: ?Lesleigh Noe, MD ? ?HPI: ?87 year old female followed for obstructive sleep apnea ?Medical history significant for coronary artery disease and hypertension ? ?TEST/EVENTS :  ?08/2012 HST AHI 7/h ?  ?03/2015 CPAP titration 11 cm ? ?06/20/2021 Follow up: OSA ?Patient presents for a 1 year follow-up visit for sleep apnea.  Patient says overall she is doing okay on CPAP.  She tries to wear her CPAP every single night.  Does miss an occasional night here and there.  She says she typically wears her sleep Pap for about 5 to 6 hours.  Patient says she feels that CPAP does help her.  CPAP download shows 57% compliance with daily average usage at 5 hours.  Patient is on CPAP 11 cm H2O.  AHI is 1.7/hour.  Positive mask leaks. ?We discussed CPAP compliance. Says she falls asleep sometimes and forgets  ?Uses a nasal mask, this is most comfortable for her.  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  ?Had knee replacement last year, so much better.  ?Remains very active . Does yard work.  ? ?Allergies  ?Allergen Reactions  ? Atorvastatin Other (See Comments)  ?  Muscle and leg myalgias  ? Tramadol Nausea And Vomiting  ? Codeine Nausea And Vomiting  ? Doxycycline Nausea And Vomiting  ? Hydrocodone Nausea And Vomiting  ? Oxycodone-Acetaminophen Nausea And Vomiting  ? ? ?Immunization History  ?Administered Date(s) Administered  ? Fluad Quad(high Dose 65+) 01/06/2019  ? Influenza-Unspecified 01/22/2020  ? PFIZER(Purple Top)SARS-COV-2 Vaccination 04/06/2019, 05/03/2019, 02/17/2020  ? ? ?Past Medical History:  ?Diagnosis Date  ? Cancer ([place]) 2000  ? right breast stage 2 chemo and radiation  ? 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 followed by vascular--- dr p. levy ([facility] in [place])  ? Chronic rhinitis   ? Chronic venous insufficiency   ? Claustrophobia   ? "SEVERE"  ?  Claustrophobia   ? Complication of anesthesia   ? Depression   ? Dysrhythmia 2016  ? GERD (gastroesophageal reflux disease)   ? GI bleed 09/22/2020  ? Heart murmur   ? sees dr Terrence Dupont q 3 months mild murmur per pt  ? Hiatal hernia   ? History of adenomatous polyp of colon   ? History of atrial fibrillation   ? cardiologist--- dr Terrence Dupont---  first dx 2014,  per pt when at time of recurrent flare-up ischemic colitis in 2016 she was in normal rhythm  ? History of cancer chemotherapy 2000  ? for breast cancer  ? History of cervical dysplasia age 81  ? s/p  cervical cone bx  ? History of external beam radiation therapy 2000  ? right breast cancer  ? History of ischemic colitis   ? followed by dr Cristina Gong  ([facility])  pt hx recurrent ischemic or infectious colitis  ? History of ischemic colitis   ? History of lower GI bleeding   ? History of right breast cancer oncologist--- dr Beryle Beams, [facility] Clement in epic07-19-2016 no recurrence, released prn  ? dx 01/ 2000,  right breast cancer, Stage II, positive 2 nodes, ER+/  s/p  right breast lumpectomy w/ sln bx's in 2000;  completed chemo/ radiation in 2000  ? Hyperlipidemia   ? Hypertension   ? IDA (iron deficiency anemia) 2020  ? Myocardial infarction [facility]) 2016  ?  Neuromuscular disorder ([place])   ? neropathy lt leg  ? OA (osteoarthritis)   ? knees  ? OSA on CPAP cpap set on 10  ? followed by dr Elsworth Soho (pulmonology) study in epic 09-01-2012  milds osa  ? Osteoporosis 06/2016  ? T score -3.0  ? Peripheral vascular disease ([place])   ? vericose veins  ? PONV (postoperative nausea and vomiting)   ? Pre-diabetes   ? Pt denies  ? Right wrist fracture 04/22/2020  ? ? ?Tobacco History: ?Social History  ? ?Tobacco Use  ?Smoking Status Former  ? Packs/day: 0.10  ? Years: 3.00  ? Pack years: 0.30  ? Types: Cigarettes  ? Quit date: 03/05/1968  ? Years since quitting: 53.3  ?Smokeless Tobacco Never  ? ?Counseling given: Not Answered ? ? ?Outpatient Medications Prior to Visit  ?Medication Sig Dispense  Refill  ? amLODipine (NORVASC) 2.5 MG tablet Take 2.5 mg by mouth daily.  3  ? aspirin EC 81 MG tablet Take 1 tablet (81 mg total) by mouth 2 (two) times daily. Swallow whole. 60 tablet 1  ? atorvastatin (LIPITOR) 20 MG tablet Take 20 mg by mouth every Monday, Wednesday, and Friday.    ? ferrous sulfate 325 (65 FE) MG tablet Take 1 tablet (325 mg total) by mouth daily with breakfast. 30 tablet 1  ? loratadine (CLARITIN) 10 MG tablet Take 10 mg by mouth daily.    ? losartan (COZAAR) 100 MG tablet Take 100 mg by mouth daily.    ? metoprolol succinate (TOPROL-XL) 25 MG 24 hr tablet Take 25-50 mg by mouth See admin instructions. Take '25mg'$  in the morning and '50mg'$  at night.    ? Multiple Vitamin (MULTIVITAMIN) capsule Take 1 capsule by mouth daily.    ? omeprazole (PRILOSEC) 20 MG capsule Take 1 capsule (20 mg total) by mouth 2 (two) times daily. 180 capsule 3  ? oxybutynin (DITROPAN) 5 MG tablet Take 1 tablet (5 mg total) by mouth 3 (three) times daily. 270 tablet 3  ? vitamin C (ASCORBIC ACID) 500 MG tablet Take 500 mg by mouth daily.    ? ?No facility-administered medications prior to visit.  ? ? ? ?Review of Systems:  ? ?Constitutional:   No  weight loss, night sweats,  Fevers, chills, fatigue, or  lassitude. ? ?HEENT:   No headaches,  Difficulty swallowing,  Tooth/dental problems, or  Sore throat,  ?              No sneezing, itching, ear ache, nasal congestion, post nasal drip,  ? ?CV:  No chest pain,  Orthopnea, PND, swelling in lower extremities, anasarca, dizziness, palpitations, syncope.  ? ?GI  No heartburn, indigestion, abdominal pain, nausea, vomiting, diarrhea, change in bowel habits, loss of appetite, bloody stools.  ? ?Resp: No shortness of breath with exertion or at rest.  No excess mucus, no productive cough,  No non-productive cough,  No coughing up of blood.  No change in color of mucus.  No wheezing.  No chest wall deformity ? ?Skin: no rash or lesions. ? ?GU: no dysuria, change in color of urine, no  urgency or frequency.  No flank pain, no hematuria  ? ?MS:  No joint pain or swelling.  No decreased range of motion.  No back pain. ? ? ? ?Physical Exam ? ?BP 140/60 (BP Location: Left Arm, Patient Position: Sitting, Cuff Size: Normal)   Pulse 85   Temp 97.9 ?F (36.6 ?C) (Oral)   Ht 5' 3.5" (  1.613 m)   Wt 165 lb (74.8 kg)   LMP 07/22/2015   SpO2 99%   BMI 28.77 kg/m?  ? ?GEN: A/Ox3; pleasant , NAD, well nourished  ?  ?HEENT:  Dent/AT,  NOSE-clear, THROAT-clear, no lesions, no postnasal drip or exudate noted.  Class II-III MP airway ? ?NECK:  Supple w/ fair ROM; no JVD; normal carotid impulses w/o bruits; no thyromegaly or nodules palpated; no lymphadenopathy.   ? ?RESP  Clear  P & A; w/o, wheezes/ rales/ or rhonchi. no accessory muscle use, no dullness to percussion ? ?CARD:  RRR, no m/r/g, no peripheral edema, pulses intact, no cyanosis or clubbing. ? ?GI:   Soft & nt; nml bowel sounds; no organomegaly or masses detected.  ? ?Musco: Warm bil, no deformities or joint swelling noted.  ? ?Neuro: alert, no focal deficits noted.   ? ?Skin: Warm, no lesions or rashes ? ? ? ?Lab Results: ? ?CBC ? ? ? ?BNP ?No results found for: BNP ? ?ProBNP ?No results found for: PROBNP ? ?Imaging: ?No results found. ? ? ? ?   ? View : No data to display.  ?  ?  ?  ? ? ?No results found for: NITRICOXIDE ? ? ? ? ? ?Assessment & Plan:  ? ?OSA (obstructive sleep apnea) ?Encouraged on CPAP compliance-  ?- discussed how weight can impact sleep and risk for sleep disordered breathing ?- discussed options to assist with weight loss: combination of diet modification, cardiovascular and strength training exercises ?  ?- had an extensive discussion regarding the adverse health consequences related to untreated sleep disordered breathing ?- specifically discussed the risks for hypertension, coronary artery disease, cardiac dysrhythmias, cerebrovascular disease, and diabetes ?- lifestyle modification discussed ?  ?- discussed how sleep  disruption can increase risk of accidents, particularly when driving ?- safe driving practices were discussed ?  ? ?Plan  ?Patient Instructions  ?Try to wear CPAP each night for at least 6  hours ?Do not drive if sleepy ?Work

## 2021-06-20 NOTE — Assessment & Plan Note (Signed)
Encouraged on CPAP compliance-  ?- discussed how weight can impact sleep and risk for sleep disordered breathing ?- discussed options to assist with weight loss: combination of diet modification, cardiovascular and strength training exercises ?  ?- had an extensive discussion regarding the adverse health consequences related to untreated sleep disordered breathing ?- specifically discussed the risks for hypertension, coronary artery disease, cardiac dysrhythmias, cerebrovascular disease, and diabetes ?- lifestyle modification discussed ?  ?- discussed how sleep disruption can increase risk of accidents, particularly when driving ?- safe driving practices were discussed ?  ? ?Plan  ?Patient Instructions  ?Try to wear CPAP each night for at least 6  hours ?Do not drive if sleepy ?Work on healthy weight ?Remain active ?Follow-up in 1 year with Dr. Elsworth Soho and as needed ?  ? ?

## 2021-06-20 NOTE — Patient Instructions (Signed)
Try to wear CPAP each night for at least 6  hours ?Do not drive if sleepy ?Work on healthy weight ?Remain active ?Follow-up in 1 year with Dr. Elsworth Soho and as needed ?

## 2021-06-21 NOTE — Telephone Encounter (Signed)
error 

## 2021-07-21 ENCOUNTER — Ambulatory Visit (INDEPENDENT_AMBULATORY_CARE_PROVIDER_SITE_OTHER): Payer: PPO

## 2021-07-21 VITALS — Ht 63.5 in | Wt 155.0 lb

## 2021-07-21 DIAGNOSIS — Z0000 Encounter for general adult medical examination without abnormal findings: Secondary | ICD-10-CM | POA: Diagnosis not present

## 2021-07-21 NOTE — Patient Instructions (Signed)
[name] , Thank you for taking time to come for your Medicare Wellness Visit. I appreciate your ongoing commitment to your health goals. Please review the following plan we discussed and let me know if I can assist you in the future.   Screening recommendations/referrals: Colonoscopy: not required Mammogram: completed 02/10/2021, due 02/11/2022 Bone Density: complete 07/05/2016 Recommended yearly ophthalmology/optometry visit for glaucoma screening and checkup Recommended yearly dental visit for hygiene and checkup  Vaccinations: Influenza vaccine: due 10/03/2021 Pneumococcal vaccine: due Tdap vaccine: due Shingles vaccine: discussed    Covid-19:02/17/2020, 05/03/2019, 04/06/2019  Advanced directives: Advance directive discussed with you today. Even though you declined this today please call our office should you change your mind and we can give you the proper paperwork for you to fill out.  Conditions/risks identified: none  Next appointment: Follow up in one year for your annual wellness visit    Preventive Care 65 Years and Older, Female Preventive care refers to lifestyle choices and visits with your health care provider that can promote health and wellness. What does preventive care include? A yearly physical exam. This is also called an annual well check. Dental exams once or twice a year. Routine eye exams. Ask your health care provider how often you should have your eyes checked. Personal lifestyle choices, including: Daily care of your teeth and gums. Regular physical activity. Eating a healthy diet. Avoiding tobacco and drug use. Limiting alcohol use. Practicing safe sex. Taking low-dose aspirin every day. Taking vitamin and mineral supplements as recommended by your health care provider. What happens during an annual well check? The services and screenings done by your health care provider during your annual well check will depend on your age, overall health, lifestyle  risk factors, and family history of disease. Counseling  Your health care provider may ask you questions about your: Alcohol use. Tobacco use. Drug use. Emotional well-being. Home and relationship well-being. Sexual activity. Eating habits. History of falls. Memory and ability to understand (cognition). Work and work Statistician. Reproductive health. Screening  You may have the following tests or measurements: Height, weight, and BMI. Blood pressure. Lipid and cholesterol levels. These may be checked every 5 years, or more frequently if you are over 37 years old. Skin check. Lung cancer screening. You may have this screening every year starting at age 50 if you have a 30-pack-year history of smoking and currently smoke or have quit within the past 15 years. Fecal occult blood test (FOBT) of the stool. You may have this test every year starting at age 83. Flexible sigmoidoscopy or colonoscopy. You may have a sigmoidoscopy every 5 years or a colonoscopy every 10 years starting at age 82. Hepatitis C blood test. Hepatitis B blood test. Sexually transmitted disease (STD) testing. Diabetes screening. This is done by checking your blood sugar (glucose) after you have not eaten for a while (fasting). You may have this done every 1-3 years. Bone density scan. This is done to screen for osteoporosis. You may have this done starting at age 71. Mammogram. This may be done every 1-2 years. Talk to your health care provider about how often you should have regular mammograms. Talk with your health care provider about your test results, treatment options, and if necessary, the need for more tests. Vaccines  Your health care provider may recommend certain vaccines, such as: Influenza vaccine. This is recommended every year. Tetanus, diphtheria, and acellular pertussis (Tdap, Td) vaccine. You may need a Td booster every 10 years. Zoster vaccine.  You may need this after age 50. Pneumococcal  13-valent conjugate (PCV13) vaccine. One dose is recommended after age 55. Pneumococcal polysaccharide (PPSV23) vaccine. One dose is recommended after age 44. Talk to your health care provider about which screenings and vaccines you need and how often you need them. This information is not intended to replace advice given to you by your health care provider. Make sure you discuss any questions you have with your health care provider. Document Released: 03/18/2015 Document Revised: 11/09/2015 Document Reviewed: 12/21/2014 Elsevier Interactive Patient Education  2017 [employer] Prevention in the Home Falls can cause injuries. They can happen to people of all ages. There are many things you can do to make your home safe and to help prevent falls. What can I do on the outside of my home? Regularly fix the edges of walkways and driveways and fix any cracks. Remove anything that might make you trip as you walk through a door, such as a raised step or threshold. Trim any bushes or trees on the path to your home. Use bright outdoor lighting. Clear any walking paths of anything that might make someone trip, such as rocks or tools. Regularly check to see if handrails are loose or broken. Make sure that both sides of any steps have handrails. Any raised decks and porches should have guardrails on the edges. Have any leaves, snow, or ice cleared regularly. Use sand or salt on walking paths during winter. Clean up any spills in your garage right away. This includes oil or grease spills. What can I do in the bathroom? Use night lights. Install grab bars by the toilet and in the tub and shower. Do not use towel bars as grab bars. Use non-skid mats or decals in the tub or shower. If you need to sit down in the shower, use a plastic, non-slip stool. Keep the floor dry. Clean up any water that spills on the floor as soon as it happens. Remove soap buildup in the tub or shower regularly. Attach  bath mats securely with double-sided non-slip rug tape. Do not have throw rugs and other things on the floor that can make you trip. What can I do in the bedroom? Use night lights. Make sure that you have a light by your bed that is easy to reach. Do not use any sheets or blankets that are too big for your bed. They should not hang down onto the floor. Have a firm chair that has side arms. You can use this for support while you get dressed. Do not have throw rugs and other things on the floor that can make you trip. What can I do in the kitchen? Clean up any spills right away. Avoid walking on wet floors. Keep items that you use a lot in easy-to-reach places. If you need to reach something above you, use a strong step stool that has a grab bar. Keep electrical cords out of the way. Do not use floor polish or wax that makes floors slippery. If you must use wax, use non-skid floor wax. Do not have throw rugs and other things on the floor that can make you trip. What can I do with my stairs? Do not leave any items on the stairs. Make sure that there are handrails on both sides of the stairs and use them. Fix handrails that are broken or loose. Make sure that handrails are as long as the stairways. Check any carpeting to make sure that it is  firmly attached to the stairs. Fix any carpet that is loose or worn. Avoid having throw rugs at the top or bottom of the stairs. If you do have throw rugs, attach them to the floor with carpet tape. Make sure that you have a light switch at the top of the stairs and the bottom of the stairs. If you do not have them, ask someone to add them for you. What else can I do to help prevent falls? Wear shoes that: Do not have high heels. Have rubber bottoms. Are comfortable and fit you well. Are closed at the toe. Do not wear sandals. If you use a stepladder: Make sure that it is fully opened. Do not climb a closed stepladder. Make sure that both sides of the  stepladder are locked into place. Ask someone to hold it for you, if possible. Clearly mark and make sure that you can see: Any grab bars or handrails. First and last steps. Where the edge of each step is. Use tools that help you move around (mobility aids) if they are needed. These include: Canes. Walkers. Scooters. Crutches. Turn on the lights when you go into a dark area. Replace any light bulbs as soon as they burn out. Set up your furniture so you have a clear path. Avoid moving your furniture around. If any of your floors are uneven, fix them. If there are any pets around you, be aware of where they are. Review your medicines with your doctor. Some medicines can make you feel dizzy. This can increase your chance of falling. Ask your doctor what other things that you can do to help prevent falls. This information is not intended to replace advice given to you by your health care provider. Make sure you discuss any questions you have with your health care provider. Document Released: 12/16/2008 Document Revised: 07/28/2015 Document Reviewed: 03/26/2014 Elsevier Interactive Patient Education  2017 [employer].

## 2021-07-21 NOTE — Progress Notes (Signed)
I connected with Wanda Green today by telephone and verified that I am speaking with the correct person using two identifiers. Location patient: home Location provider: work Persons participating in the virtual visit: Donelle, Hise LPN.   I discussed the limitations, risks, security and privacy concerns of performing an evaluation and management service by telephone and the availability of in person appointments. I also discussed with the patient that there may be a patient responsible charge related to this service. The patient expressed understanding and verbally consented to this telephonic visit.    Interactive audio and video telecommunications were attempted between this provider and patient, however failed, due to patient having technical difficulties OR patient did not have access to video capability.  We continued and completed visit with audio only.     Vital signs may be patient reported or missing.  Subjective:   Wanda Green is a 87 y.o. female who presents for an Initial Medicare Annual Wellness Visit.  Review of Systems     Cardiac Risk Factors include: advanced age (>78mn, >>31women);hypertension     Objective:    Today's Vitals   07/21/21 1146  Weight: 155 lb (70.3 kg)  Height: 5' 3.5" (1.613 m)   Body mass index is 27.03 kg/m.     07/21/2021   11:54 AM 09/22/2020    9:25 AM 09/20/2020    6:03 AM 09/09/2020    2:13 PM 06/27/2020    2:30 PM 04/26/2020   12:43 PM 07/15/2019    7:41 PM  Advanced Directives  Does Patient Have a Medical Advance Directive? No No No No No No;Yes No  Type of Advance Directive      [place] Chart?      No - copy requested   Would patient like information on creating a medical advance directive?  No - Guardian declined No - Patient declined  No - Patient declined  No - Patient declined    Current Medications (verified) Outpatient Encounter Medications  as of 07/21/2021  Medication Sig   amLODipine (NORVASC) 2.5 MG tablet Take 2.5 mg by mouth daily.   aspirin EC 81 MG tablet Take 1 tablet (81 mg total) by mouth 2 (two) times daily. Swallow whole.   atorvastatin (LIPITOR) 20 MG tablet Take 20 mg by mouth every Monday, Wednesday, and Friday.   losartan (COZAAR) 100 MG tablet Take 100 mg by mouth daily.   metoprolol succinate (TOPROL-XL) 25 MG 24 hr tablet Take 25-50 mg by mouth See admin instructions. Take '25mg'$  in the morning and '50mg'$  at night.   Multiple Vitamin (MULTIVITAMIN) capsule Take 1 capsule by mouth daily.   omeprazole (PRILOSEC) 20 MG capsule Take 1 capsule (20 mg total) by mouth 2 (two) times daily.   oxybutynin (DITROPAN) 5 MG tablet Take 1 tablet (5 mg total) by mouth 3 (three) times daily.   Probiotic Product (PROBIOTIC DAILY PO) Take 1 tablet by mouth daily.   ferrous sulfate 325 (65 FE) MG tablet Take 1 tablet (325 mg total) by mouth daily with breakfast. (Patient not taking: Reported on 07/21/2021)   loratadine (CLARITIN) 10 MG tablet Take 10 mg by mouth daily. (Patient not taking: Reported on 07/21/2021)   vitamin C (ASCORBIC ACID) 500 MG tablet Take 500 mg by mouth daily. (Patient not taking: Reported on 07/21/2021)   No facility-administered encounter medications on file as of 07/21/2021.    Allergies (verified) Atorvastatin, Tramadol, Codeine,  Doxycycline, Hydrocodone, and Oxycodone-acetaminophen   History: Past Medical History:  Diagnosis Date   Cancer ([place]) 2000   right breast stage 2 chemo and radiation   Celiac artery stenosis (HCC)    followed by vascular--- dr Mamie Nick. [place] ([facility] in [place])   Chronic rhinitis    Chronic venous insufficiency    Claustrophobia    "SEVERE"   Claustrophobia    Complication of anesthesia    Depression    Dysrhythmia 2016   GERD (gastroesophageal reflux disease)    GI bleed 09/22/2020   Heart murmur    sees dr Terrence Dupont q 3 months mild murmur per pt   Hiatal hernia    History of  adenomatous polyp of colon    History of atrial fibrillation    cardiologist--- dr Terrence Dupont---  first dx 2014,  per pt when at time of recurrent flare-up ischemic colitis in 2016 she was in normal rhythm   History of cancer chemotherapy 2000   for breast cancer   History of cervical dysplasia age 81   s/p  cervical [facility] bx   History of external beam radiation therapy 2000   right breast cancer   History of ischemic colitis    followed by dr Cristina Gong  (w/ Sadie Haber)  pt hx recurrent ischemic or infectious colitis   History of ischemic colitis    History of lower GI bleeding    History of right breast cancer oncologist--- dr Beryle Beams, Cassell Clement in epic07-19-2016 no recurrence, released prn   dx 01/ 2000,  right breast cancer, Stage II, positive 2 nodes, ER+/  s/p  right breast lumpectomy w/ sln bx's in 2000;  completed chemo/ radiation in 2000   Hyperlipidemia    Hypertension    IDA (iron deficiency anemia) 2020   Myocardial infarction ([place]) 2016   Neuromuscular disorder ([place])    neropathy lt leg   OA (osteoarthritis)    knees   OSA on CPAP cpap set on 10   followed by dr Elsworth Soho (pulmonology) study in epic 09-01-2012  milds osa   Osteoporosis 06/2016   T score -3.0   Peripheral vascular disease ([place])    vericose veins   PONV (postoperative nausea and vomiting)    Pre-diabetes    Pt denies   Right wrist fracture 04/22/2020   Past Surgical History:  Procedure Laterality Date   BREAST BIOPSY Right 2000   BREAST LUMPECTOMY WITH AXILLARY LYMPH NODE BIOPSY Right 2000   CARDIAC CATHETERIZATION N/A 11/25/2014   Procedure: Left Heart Cath and Coronary Angiography;  Surgeon: Dixie Dials, MD;  Location: [facility];  Service: Cardiovascular;  Laterality: N/A;   CATARACT EXTRACTION W/ INTRAOCULAR LENS  IMPLANT, BILATERAL Bilateral 2018   CERVICAL CONE BIOPSY  age 19   COLONOSCOPY WITH PROPOFOL N/A 09/01/2014   Procedure: COLONOSCOPY WITH PROPOFOL;  Surgeon: Ronald Lobo, MD;  Location[facility]  ENDOSCOPY;  Service: Endoscopy;  Laterality: N/A;  this will be done unprepped   KNEE ARTHROSCOPY Bilateral right 02/ 2003;  left 07/ 2005  '@[facility]'$    NASAL SINUS SURGERY  02/2013   including deviated septum repair   OPEN REDUCTION INTERNAL FIXATION (ORIF) DISTAL RADIAL FRACTURE Right 04/26/2020   Procedure: OPEN REDUCTION INTERNAL FIXATION (ORIF) RIGHT  DISTAL RADIAL FRACTURE;  Surgeon: [name], MD;  Location: [facility];  Service: Orthopedics;  Laterality: Right;   ORIF WRIST FRACTURE Left 2001  '@[facility]'$    TOTAL KNEE ARTHROPLASTY Left 04/09/2017   Procedure: LEFT TOTAL KNEE ARTHROPLASTY;  Surgeon: [name], MD;  Location: [facility];  Service: Orthopedics;  Laterality: Left;   TOTAL KNEE ARTHROPLASTY Right 09/20/2020   Procedure: TOTAL KNEE ARTHROPLASTY;  Surgeon: [name], MD;  Location: [facility];  Service: Orthopedics;  Laterality: Right;   TUBAL LIGATION Bilateral 1975   VARICOSE VEIN SURGERY  05/2019   WRIST FRACTURE SURGERY Left ~ 2006   Family History  Problem Relation Age of Onset   Hypertension Mother    Heart failure Mother    Heart disease Mother    Hypertension Father    Heart disease Father    Hypertension Sister    Uterine cancer Sister    Hypertension Brother    Heart disease Brother    Social History   Socioeconomic History   Marital status: Widowed    Spouse name: Not on file   Number of children: 2   Years of education: High school   Highest education level: Not on file  Occupational History   Occupation: part time    Employer: [employer]  Tobacco Use   Smoking status: Former    Packs/day: 0.10    Years: 3.00    Pack years: 0.30    Types: Cigarettes    Quit date: 03/05/1968    Years since quitting: 53.4   Smokeless tobacco: Never  Vaping Use   Vaping Use: Never used  Substance and Sexual Activity   Alcohol use: No    Alcohol/week: 0.0 standard drinks   Drug use: No   Sexual activity: Not Currently    Birth  control/protection: Post-menopausal  Other Topics Concern   Not on file  Social History Narrative   09/03/19   From: [place] originally, here since 1980    Living: with daughter Sharyn Lull and granddaughter   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, 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      Family: Sharyn Lull and Margreta Journey (lives in [place]) - one granddaughter      Enjoys: mow the yard, walking      Exercise: walking and exercise bike   Diet: healthy - no fats      Safety   Seat belts: Yes    Guns: No   Safe in relationships: Yes    Social Determinants of Radio broadcast assistant Strain: Low Risk    Difficulty of Paying Living Expenses: Not hard at all  Food Insecurity: No Food Insecurity   Worried About Charity fundraiser in the Last Year: Never true   Arboriculturist in the Last Year: Never true  Transportation Needs: No Transportation Needs   Lack of Transportation (Medical): No   Lack of Transportation (Non-Medical): No  Physical Activity: Insufficiently Active   Days of Exercise per Week: 4 days   Minutes of Exercise per Session: 10 min  Stress: No Stress Concern Present   Feeling of Stress : Not at all  Social Connections: Not on file    Tobacco Counseling Counseling given: Not Answered   Clinical Intake:  Pre-visit preparation completed: Yes  Pain : No/denies pain     Nutritional Status: BMI 25 -29 Overweight Nutritional Risks: None Diabetes: No  How often do you need to have someone help you when you read instructions, pamphlets, or other written materials from your doctor or pharmacy?: 1 - Never What is the last grade level you completed in school?: 69yr college  Diabetic? no  Interpreter Needed?: No  Information entered  by :: NAllen LPN   Activities of Daily Living    07/21/2021   11:56 AM 09/09/2020    2:15 PM  In your present state of health, do you have any difficulty performing the following activities:   Hearing? 0   Vision? 0   Difficulty concentrating or making decisions? 0   Walking or climbing stairs? 1   Dressing or bathing? 0   Doing errands, shopping? 0 0  Preparing Food and eating ? N   Using the Toilet? N   In the past six months, have you accidently leaked urine? Y   Do you have problems with loss of bowel control? N   Managing your Medications? N   Managing your Finances? N   Housekeeping or managing your Housekeeping? N     Patient Care Team: Lesleigh Noe, MD as PCP - General (Family Medicine) Annia Belt, MD as Consulting Physician (Oncology) Ronald Lobo, MD as Consulting Physician (Gastroenterology) Charolette Forward, MD as Consulting Physician (Cardiology)  Indicate any recent Medical Services you may have received from other than [facility] providers in the past year (date may be approximate).     Assessment:   This is a routine wellness examination for [place].  Hearing/Vision screen Vision Screening - Comments:: Regular eye exams, Dr. Sabra Heck  Dietary issues and exercise activities discussed: Current Exercise Habits: Home exercise routine, Type of exercise: Other - see comments (stationary bike), Time (Minutes): 10, Frequency (Times/Week): 4, Weekly Exercise (Minutes/Week): 40   Goals Addressed             This Visit's Progress    Patient Stated       07/21/2021, wants to lose weight       Depression Screen    07/21/2021   11:55 AM 04/28/2018   11:01 AM 08/02/2017   11:30 AM 06/10/2017    5:50 PM 08/06/2016    5:33 PM 04/04/2016    5:33 PM 09/27/2015    2:41 PM  PHQ 2/9 Scores  PHQ - 2 Score 0 0 0 0 0 0 0    Fall Risk    07/21/2021   11:55 AM 04/28/2018   11:02 AM 08/02/2017   11:30 AM 06/10/2017    5:50 PM 08/06/2016    5:33 PM  Fall Risk   Falls in the past year? 0 0 No No No  Number falls in past yr: 0      Injury with Fall? 0      Risk for fall due to : Medication side effect      Follow up Falls evaluation completed;Education  provided;Falls prevention discussed Falls prevention discussed       FALL RISK PREVENTION PERTAINING TO THE HOME:  Any stairs in or around the home? Yes  If so, are there any without handrails? No  Home free of loose throw rugs in walkways, pet beds, electrical cords, etc? Yes  Adequate lighting in your home to reduce risk of falls? Yes   ASSISTIVE DEVICES UTILIZED TO PREVENT FALLS:  Life alert? No  Use of a cane, walker or w/c? No  Grab bars in the bathroom? Yes  Shower chair or bench in shower? No  Elevated toilet seat or a handicapped toilet? Yes   TIMED UP AND GO:  Was the test performed? No .      Cognitive Function:        07/21/2021   11:58 AM  6CIT Screen  What Year? 0 points  What month? 0  points  What time? 0 points  Count back from 20 0 points  Months in reverse 2 points  Repeat phrase 0 points  Total Score 2 points    Immunizations Immunization History  Administered Date(s) Administered   Fluad Quad(high Dose 65+) 01/06/2019   Influenza-Unspecified 01/22/2020   PFIZER(Purple Top)SARS-COV-2 Vaccination 04/06/2019, 05/03/2019, 02/17/2020    TDAP status: Due, Education has been provided regarding the importance of this vaccine. Advised may receive this vaccine at local pharmacy or Health Dept. Aware to provide a copy of the vaccination record if obtained from local pharmacy or Health Dept. Verbalized acceptance and understanding.  Flu Vaccine status: Up to date  Pneumococcal vaccine status: Due, Education has been provided regarding the importance of this vaccine. Advised may receive this vaccine at local pharmacy or Health Dept. Aware to provide a copy of the vaccination record if obtained from local pharmacy or Health Dept. Verbalized acceptance and understanding.  Covid-19 vaccine status: Completed vaccines  Qualifies for Shingles Vaccine? Yes   Zostavax completed No   Shingrix Completed?: No.    Education has been provided regarding the  importance of this vaccine. Patient has been advised to call insurance company to determine out of pocket expense if they have not yet received this vaccine. Advised may also receive vaccine at local pharmacy or Health Dept. Verbalized acceptance and understanding.  Screening Tests Health Maintenance  Topic Date Due   Pneumonia Vaccine 74+ Years old (1 - PCV) Never done   TETANUS/TDAP  Never done   Zoster Vaccines- Shingrix (1 of 2) Never done   COVID-19 Vaccine (4 - Booster for [employer] series) 04/13/2020   INFLUENZA VACCINE  10/03/2021   DEXA SCAN  Completed   HPV VACCINES  Aged Out    Health Maintenance  Health Maintenance Due  Topic Date Due   Pneumonia Vaccine 23+ Years old (1 - PCV) Never done   TETANUS/TDAP  Never done   Zoster Vaccines- Shingrix (1 of 2) Never done   COVID-19 Vaccine (4 - Booster for [place] series) 04/13/2020    Colorectal cancer screening: No longer required.   Mammogram status: Completed 02/10/2021. Repeat every year  Bone Density status: Completed 07/05/2016.   Lung Cancer Screening: (Low Dose CT Chest recommended if Age 54-80 years, 30 pack-year currently smoking OR have quit w/in 15years.) does not qualify.   Lung Cancer Screening Referral: no  Additional Screening:  Hepatitis C Screening: does not qualify;   Vision Screening: Recommended annual ophthalmology exams for early detection of glaucoma and other disorders of the eye. Is the patient up to date with their annual eye exam?  Yes  Who is the provider or what is the name of the office in which the patient attends annual eye exams? Dr. Sabra Heck If pt is not established with a provider, would they like to be referred to a provider to establish care? No .   Dental Screening: Recommended annual dental exams for proper oral hygiene  Community Resource Referral / Chronic Care Management: CRR required this visit?  No   CCM required this visit?  No      Plan:     I have personally reviewed  and noted the following in the patient's chart:   Medical and social history Use of alcohol, tobacco or illicit drugs  Current medications and supplements including opioid prescriptions. Patient is not currently taking opioid prescriptions. Functional ability and status Nutritional status Physical activity Advanced directives List of other physicians Hospitalizations, surgeries, and ER visits  in previous 12 months Vitals Screenings to include cognitive, depression, and falls Referrals and appointments  In addition, I have reviewed and discussed with patient certain preventive protocols, quality metrics, and best practice recommendations. A written personalized care plan for preventive services as well as general preventive health recommendations were provided to patient.     Kellie Simmering, LPN   08/31/6379   Nurse Notes: none  Due to this being a virtual visit, the after visit summary with patients personalized plan was offered to patient via mail or my-chart. Patient would like to access on my-chart

## 2021-08-03 DIAGNOSIS — S61411A Laceration without foreign body of right hand, initial encounter: Secondary | ICD-10-CM | POA: Diagnosis not present

## 2021-08-03 DIAGNOSIS — Z23 Encounter for immunization: Secondary | ICD-10-CM | POA: Diagnosis not present

## 2021-08-26 DIAGNOSIS — S8991XA Unspecified injury of right lower leg, initial encounter: Secondary | ICD-10-CM | POA: Diagnosis not present

## 2021-08-26 DIAGNOSIS — L03115 Cellulitis of right lower limb: Secondary | ICD-10-CM | POA: Diagnosis not present

## 2021-08-26 DIAGNOSIS — M79661 Pain in right lower leg: Secondary | ICD-10-CM | POA: Diagnosis not present

## 2021-08-26 DIAGNOSIS — S80811A Abrasion, right lower leg, initial encounter: Secondary | ICD-10-CM | POA: Diagnosis not present

## 2021-09-15 DIAGNOSIS — I2510 Atherosclerotic heart disease of native coronary artery without angina pectoris: Secondary | ICD-10-CM | POA: Diagnosis not present

## 2021-09-15 DIAGNOSIS — E785 Hyperlipidemia, unspecified: Secondary | ICD-10-CM | POA: Diagnosis not present

## 2021-09-15 DIAGNOSIS — D649 Anemia, unspecified: Secondary | ICD-10-CM | POA: Diagnosis not present

## 2021-09-15 DIAGNOSIS — I10 Essential (primary) hypertension: Secondary | ICD-10-CM | POA: Diagnosis not present

## 2021-10-28 ENCOUNTER — Other Ambulatory Visit: Payer: Self-pay | Admitting: Family Medicine

## 2021-10-28 DIAGNOSIS — N3281 Overactive bladder: Secondary | ICD-10-CM

## 2021-11-10 DIAGNOSIS — G4733 Obstructive sleep apnea (adult) (pediatric): Secondary | ICD-10-CM | POA: Diagnosis not present

## 2021-11-11 ENCOUNTER — Other Ambulatory Visit: Payer: Self-pay | Admitting: Family Medicine

## 2021-11-11 DIAGNOSIS — K449 Diaphragmatic hernia without obstruction or gangrene: Secondary | ICD-10-CM

## 2021-11-14 ENCOUNTER — Ambulatory Visit (INDEPENDENT_AMBULATORY_CARE_PROVIDER_SITE_OTHER): Payer: PPO | Admitting: Family Medicine

## 2021-11-14 VITALS — BP 120/60 | HR 66 | Temp 97.5°F | Ht 61.5 in | Wt 159.0 lb

## 2021-11-14 DIAGNOSIS — R7303 Prediabetes: Secondary | ICD-10-CM

## 2021-11-14 DIAGNOSIS — I10 Essential (primary) hypertension: Secondary | ICD-10-CM

## 2021-11-14 DIAGNOSIS — K449 Diaphragmatic hernia without obstruction or gangrene: Secondary | ICD-10-CM

## 2021-11-14 DIAGNOSIS — Z0000 Encounter for general adult medical examination without abnormal findings: Secondary | ICD-10-CM

## 2021-11-14 DIAGNOSIS — Z23 Encounter for immunization: Secondary | ICD-10-CM | POA: Diagnosis not present

## 2021-11-14 DIAGNOSIS — N3281 Overactive bladder: Secondary | ICD-10-CM | POA: Diagnosis not present

## 2021-11-14 DIAGNOSIS — D509 Iron deficiency anemia, unspecified: Secondary | ICD-10-CM

## 2021-11-14 DIAGNOSIS — Z13220 Encounter for screening for lipoid disorders: Secondary | ICD-10-CM

## 2021-11-14 LAB — CBC
HCT: 32.8 % — ABNORMAL LOW (ref 36.0–46.0)
Hemoglobin: 11.1 g/dL — ABNORMAL LOW (ref 12.0–15.0)
MCHC: 34 g/dL (ref 30.0–36.0)
MCV: 92.8 fl (ref 78.0–100.0)
Platelets: 194 10*3/uL (ref 150.0–400.0)
RBC: 3.54 Mil/uL — ABNORMAL LOW (ref 3.87–5.11)
RDW: 13.2 % (ref 11.5–15.5)
WBC: 5.3 10*3/uL (ref 4.0–10.5)

## 2021-11-14 LAB — COMPREHENSIVE METABOLIC PANEL
ALT: 13 U/L (ref 0–35)
AST: 17 U/L (ref 0–37)
Albumin: 4.4 g/dL (ref 3.5–5.2)
Alkaline Phosphatase: 51 U/L (ref 39–117)
BUN: 25 mg/dL — ABNORMAL HIGH (ref 6–23)
CO2: 28 mEq/L (ref 19–32)
Calcium: 10 mg/dL (ref 8.4–10.5)
Chloride: 103 mEq/L (ref 96–112)
Creatinine, Ser: 1.13 mg/dL (ref 0.40–1.20)
GFR: 44.57 mL/min — ABNORMAL LOW (ref >60.00)
Glucose, Bld: 98 mg/dL (ref 70–99)
Potassium: 4.4 mEq/L (ref 3.5–5.1)
Sodium: 140 mEq/L (ref 135–145)
Total Bilirubin: 0.6 mg/dL (ref 0.2–1.2)
Total Protein: 7.5 g/dL (ref 6.0–8.3)

## 2021-11-14 LAB — LIPID PANEL
Cholesterol: 110 mg/dL (ref 0–200)
HDL: 51.6 mg/dL (ref >39.00)
LDL Cholesterol: 44 mg/dL (ref 0–99)
NonHDL: 58.52
Total CHOL/HDL Ratio: 2
Triglycerides: 72 mg/dL (ref 0.0–149.0)
VLDL: 14.4 mg/dL (ref 0.0–40.0)

## 2021-11-14 LAB — IBC + FERRITIN
Ferritin: 24.2 ng/mL (ref 10.0–291.0)
Iron: 63 ug/dL (ref 42–145)
Saturation Ratios: 22.8 % (ref 20.0–50.0)
TIBC: 275.8 ug/dL (ref 250.0–450.0)
Transferrin: 197 mg/dL — ABNORMAL LOW (ref 212.0–360.0)

## 2021-11-14 LAB — HEMOGLOBIN A1C: Hgb A1c MFr Bld: 6.1 % (ref 4.6–6.5)

## 2021-11-14 MED ORDER — SOLIFENACIN SUCCINATE 5 MG PO TABS: 5.0000 mg | ORAL_TABLET | Freq: Every day | ORAL | 0 refills | Status: DC

## 2021-11-14 MED ORDER — OMEPRAZOLE 20 MG PO CPDR: 20.0000 mg | DELAYED_RELEASE_CAPSULE | Freq: Two times a day (BID) | ORAL | 1 refills | Status: DC

## 2021-11-14 NOTE — Assessment & Plan Note (Signed)
She would like to try vesicare was too expensive in the past. Start vesicare 5 mg increase to 10 mg if tolerated. Stop oxybutynin. Update with response.

## 2021-11-14 NOTE — Patient Instructions (Signed)
Stop oxybutynin  Start Vesicare 5 mg - if tolerating and not working can increase to 10 mg (2 pills) after 1 week  Update in 2-3 weeks with effect and what you need refilled

## 2021-11-14 NOTE — Progress Notes (Signed)
Annual Exam   Chief Complaint:  Chief Complaint  Patient presents with   Medicare Wellness    Part 2     History of Present Illness:  [name] is a 87 y.o. [id number] who LMP was Patient's last menstrual period was 07/22/2015., presents today for her annual examination.    #Overactive bladder - would like to try vesicare - tolerating oxybutynin - stopped vesicare due to cost   Nutrition She does get adequate calcium and Vitamin D in her diet. 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     The patient feels safe at home and in their relationships: yes.   Menstrual:  Symptoms of menopause: no  GYN She is not sexually active.   Breast Cancer Screening (Age 62-74):  There is no FH of breast cancer. There is no FH of ovarian cancer. BRCA screening Not Indicated.  Last Mammogram: 2022 The patient does want a mammogram this year.    Colon Cancer Screening:  Age 51-75 yo - benefits outweigh the risk. Adults 76-85 yo who have never been screened benefit.  Benefits: 134000 people in 2016 will be diagnosed and 49,000 will die - early detection helps [place]: Complications 2/2 to colonoscopy High Risk (Colonoscopy): genetic disorder (Lynch syndrome or familial adenomatous polyposis), personal hx of IBD, previous adenomatous polyp, or previous colorectal cancer, FamHx start 10 years before the age at diagnosis, increased in males and  black race  Options:  FIT - looks for hemoglobin (blood in the stool) - specific and fairly sensitive - must be done annually Cologuard - looks for DNA and blood - more sensitive - therefore can have more false positives, every 3 years Colonoscopy - every 10 years if normal - sedation, bowl prep, must have someone drive you  Shared decision making and the patient had decided to do has aged out.   Social History   Tobacco Use  Smoking Status Former   Packs/day: 0.10   Years: 3.00   Total pack years: 0.30   Types: Cigarettes   Quit date: 03/05/1968   Years since quitting: 53.7  Smokeless Tobacco Never     Weight Wt Readings from Last 3 Encounters:  11/14/21 159 lb (72.1 kg)  07/21/21 155 lb (70.3 kg)  06/20/21 165 lb (74.8 kg)   Patient has high BMI  BMI Readings from Last 1 Encounters:  11/14/21 29.56 kg/m     Chronic disease screening Blood pressure monitoring:  BP Readings from Last 3 Encounters:  11/14/21 120/60  06/20/21 140/60  10/25/20 118/70    Lipid Monitoring: Indication for screening: age >56, obesity, diabetes, family hx, CV risk factors.  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 [place] ([facility] in [place])   Chronic rhinitis    Chronic venous insufficiency    Claustrophobia    "SEVERE"   Claustrophobia    Complication of anesthesia    Depression    Dysrhythmia 2016   GERD (gastroesophageal reflux disease)    GI bleed 09/22/2020   Heart  murmur    sees dr Terrence Dupont q 3 months mild murmur per pt   Hiatal hernia    History of adenomatous polyp of colon    History of atrial fibrillation    cardiologist--- dr Terrence Dupont---  first dx 2014,  per pt when at time of recurrent flare-up ischemic colitis in 2016 she was in normal rhythm   History of cancer chemotherapy 2000   for breast cancer   History of cervical dysplasia age 71   s/p  cervical cone bx   History of external beam radiation therapy 2000   right breast cancer   History of ischemic colitis    followed by dr Cristina Gong  (w/ Sadie Haber)  pt hx recurrent ischemic or infectious colitis   History of ischemic colitis    History of lower GI bleeding    History of right breast cancer oncologist--- dr Beryle Beams, Cassell Clement in epic07-19-2016 no recurrence, released prn   dx 01/ 2000,  right breast cancer, Stage II, positive 2 nodes, ER+/  s/p  right breast lumpectomy w/ sln bx's in 2000;  completed chemo/ radiation in 2000   Hyperlipidemia    Hypertension    IDA (iron deficiency anemia) 2020   Myocardial infarction ([place]) 2016   Neuromuscular disorder ([place])    neropathy lt leg   OA (osteoarthritis)    knees   OSA on CPAP cpap set on 10   followed by dr Elsworth Soho (pulmonology) study in epic 09-01-2012  milds osa   Osteoporosis 06/2016   T score -3.0   Peripheral vascular disease ([place])    vericose veins   PONV (postoperative nausea and vomiting)    Pre-diabetes    Pt denies   Right wrist fracture 04/22/2020    Past Surgical History:  Procedure Laterality Date   BREAST BIOPSY Right 2000   BREAST LUMPECTOMY WITH AXILLARY LYMPH NODE BIOPSY Right 2000   CARDIAC CATHETERIZATION N/A 11/25/2014   Procedure: Left Heart Cath and Coronary Angiography;  Surgeon: Dixie Dials, MD;  Location: [facility];  Service: Cardiovascular;  Laterality: N/A;   CATARACT EXTRACTION W/ INTRAOCULAR LENS  IMPLANT, BILATERAL Bilateral 2018   CERVICAL CONE BIOPSY  age 62   COLONOSCOPY WITH PROPOFOL N/A  09/01/2014   Procedure: COLONOSCOPY WITH PROPOFOL;  Surgeon: Ronald Lobo, MD;  [facility];  Service: Endoscopy;  Laterality: N/A;  this will be done unprepped   KNEE ARTHROSCOPY Bilateral right 02/ 2003;  left 07/ 2005  $Rem'@[id number]'hkCD$    NASAL SINUS SURGERY  02/2013   including deviated septum repair   OPEN REDUCTION INTERNAL FIXATION (ORIF) DISTAL RADIAL FRACTURE Right 04/26/2020   Procedure: OPEN REDUCTION INTERNAL FIXATION (ORIF) RIGHT  DISTAL RADIAL FRACTURE;  Surgeon: [name], MD;  Location: [facility];  Service: Orthopedics;  Laterality: Right;   ORIF WRIST FRACTURE Left 2001  @[facility]    TOTAL KNEE ARTHROPLASTY Left 04/09/2017   Procedure: LEFT TOTAL KNEE ARTHROPLASTY;  Surgeon: [name], MD;  Location: [facility];  Service: Orthopedics;  Laterality: Left;   TOTAL KNEE ARTHROPLASTY Right 09/20/2020   Procedure: TOTAL KNEE ARTHROPLASTY;  Surgeon: [name], MD;  Location: [facility];  Service: Orthopedics;  Laterality: Right;   TUBAL LIGATION Bilateral 1975   VARICOSE VEIN SURGERY  05/2019   WRIST FRACTURE SURGERY Left ~ 2006    Prior to Admission medications   Medication Sig Start Date End Date Taking? Authorizing Provider  amLODipine (NORVASC) 2.5 MG tablet Take 2.5 mg by mouth daily. 11/15/14  Yes [provider]  atorvastatin (LIPITOR) 20 MG tablet Take 20 mg by mouth every Monday, Wednesday, and Friday.   Yes [provider]  ferrous sulfate 325 (65 FE) MG tablet Take 1 tablet (325 mg total) by mouth daily with breakfast. 11/01/18  Yes Vann, Keithan Dileonardo U, DO  loratadine (CLARITIN) 10 MG tablet Take 10 mg by mouth daily.   Yes [provider]  losartan (COZAAR) 100 MG tablet Take 100 mg by mouth daily.   Yes [provider]  metoprolol succinate (TOPROL-XL) 25 MG 24 hr tablet Take 25-50 mg by mouth See admin instructions. Take 91m in the morning and 572mat night.   Yes [provider]  Multiple Vitamin  (MULTIVITAMIN) capsule Take 1 capsule by mouth daily.   Yes [provider]  omeprazole (PRILOSEC) 20 MG capsule Take 1 capsule (20 mg total) by mouth 2 (two) times daily. 10/25/20  Yes CoLesleigh NoeMD  oxybutynin (DITROPAN) 5 MG tablet TAKE 1 TABLET BY MOUTH THREE TIMES A DAY 10/30/21  Yes CoLesleigh NoeMD  Probiotic Product (PROBIOTIC DAILY PO) Take 1 tablet by mouth daily.   Yes [provider]  vitamin C (ASCORBIC ACID) 500 MG tablet Take 500 mg by mouth daily.   Yes [provider]    Allergies  Allergen Reactions   Atorvastatin Other (See Comments)    Muscle and leg myalgias   Tramadol Nausea And Vomiting   Codeine Nausea And Vomiting   Doxycycline Nausea And Vomiting   Hydrocodone Nausea And Vomiting   Oxycodone-Acetaminophen Nausea And Vomiting    Gynecologic History: Patient's last menstrual period was 07/22/2015.  Obstetric History: [id number] History   Socioeconomic History   Marital status: Widowed    Spouse name: Not on file   Number of children: 2   Years of education: High school   Highest education level: Not on file  Occupational History   Occupation: part time    Employer: [employer]  Tobacco Use   Smoking status: Former    Packs/day: 0.10    Years: 3.00    Total pack years: 0.30    Types: Cigarettes    Quit date: 03/05/1968    Years since quitting: 53.7   Smokeless tobacco: Never  Vaping Use   Vaping Use: Never used  Substance and Sexual Activity   Alcohol use: No    Alcohol/week: 0.0 standard drinks of alcohol   Drug use: No   Sexual activity: Not Currently    Birth control/protection: Post-menopausal  Other Topics Concern   Not on file  Social History Narrative   09/03/19   [place]    Living: with daughter MiSharyn Lullnd granddaughter  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, 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      Family: [place] (lives in [place]) - one granddaughter      Enjoys: mow the yard, walking      Exercise: walking and exercise bike   Diet: healthy - no fats      Safety   Seat belts: Yes    Guns: No   Safe in relationships: Yes    Social Determinants of Radio broadcast assistant Strain: Low Risk  (07/21/2021)   Overall Financial Resource Strain (CARDIA)    Difficulty of Paying Living Expenses: Not hard at all  Food Insecurity: No Food Insecurity (07/21/2021)   Hunger Vital Sign    Worried About Running Out of Food in the Last Year: Never true    [place] in the Last Year: Never true  Transportation Needs: No Transportation Needs (07/21/2021)   PRAPARE - Hydrologist (Medical): No    Lack of Transportation (Non-Medical): No  Physical Activity: Insufficiently Active (07/21/2021)   Exercise Vital Sign    Days of Exercise per Week: 4 days    Minutes of Exercise per Session: 10 min  Stress: No Stress Concern Present (07/21/2021)   [place]    Feeling of Stress : Not at all  Social Connections: Not on file  Intimate Partner Violence: Not on file    Family History  Problem Relation Age of Onset   Hypertension Mother    Heart failure Mother    Heart disease Mother    Hypertension Father    Heart disease Father    Hypertension Sister    Uterine cancer Sister    Hypertension Brother    Heart disease Brother     Review of Systems  Constitutional:  Negative for chills and fever.  HENT:  Negative for congestion and sore throat.   Eyes:  Negative for blurred vision and double vision.  Respiratory:  Negative for shortness of breath.   Cardiovascular:  Negative for chest pain.  Gastrointestinal:  Negative for heartburn, nausea and vomiting.  Genitourinary: Negative.   Musculoskeletal: Negative.  Negative for myalgias.  Skin:  Negative for rash.  Neurological:  Negative for  dizziness and headaches.  Endo/Heme/Allergies:  Does not bruise/bleed easily.  Psychiatric/Behavioral:  Negative for depression. The patient is not nervous/anxious.      Physical Exam BP 120/60   Pulse 66   Temp (!) 97.5 F (36.4 C) (Temporal)   Ht 5' 1.5" (1.562 m)   Wt 159 lb (72.1 kg)   LMP 07/22/2015   SpO2 97%   BMI 29.56 kg/m    BP Readings from Last 3 Encounters:  11/14/21 120/60  06/20/21 140/60  10/25/20 118/70      Physical Exam Constitutional:      General: She is not in acute distress.    Appearance: She is well-developed. She is not diaphoretic.  HENT:     Head: Normocephalic and atraumatic.     Right Ear: External ear normal.     Left Ear: External ear normal.     Nose: Nose normal.  Eyes:     General: No scleral icterus.    Extraocular Movements: Extraocular movements intact.     Conjunctiva/sclera: Conjunctivae normal.  Cardiovascular:     Rate and Rhythm: Normal rate and regular rhythm.     Heart sounds:  No murmur heard. Pulmonary:     Effort: Pulmonary effort is normal. No respiratory distress.     Breath sounds: Normal breath sounds. No wheezing.  Abdominal:     General: Bowel sounds are normal. There is no distension.     Palpations: Abdomen is soft. There is no mass.     Tenderness: There is no abdominal tenderness. There is no guarding or rebound.  Musculoskeletal:        General: Normal range of motion.     Cervical back: Neck supple.  Lymphadenopathy:     Cervical: No cervical adenopathy.  Skin:    General: Skin is warm and dry.     Capillary Refill: Capillary refill takes less than 2 seconds.  Neurological:     Mental Status: She is alert and oriented to person, place, and time.     Deep Tendon Reflexes: Reflexes normal.  Psychiatric:        Mood and Affect: Mood normal.        Behavior: Behavior normal.     Results:  PHQ-9:     07/21/2021   11:55 AM 04/28/2018   11:01 AM 08/02/2017   11:30 AM  Depression screen PHQ 2/9   Decreased Interest 0 0 0  Down, Depressed, Hopeless 0 0 0  PHQ - 2 Score 0 0 0       Assessment: 87 y.o. [id number] female here for routine annual physical examination.  Plan: Problem List Items Addressed This Visit       Cardiovascular and Mediastinum   HTN (hypertension)   Relevant Orders   Comprehensive metabolic panel   CBC     Respiratory   Hiatal hernia   Relevant Medications   omeprazole (PRILOSEC) 20 MG capsule   Other Relevant Orders   CBC   IBC + Ferritin     Genitourinary   Overactive bladder    She would like to try vesicare was too expensive in the past. Start vesicare 5 mg increase to 10 mg if tolerated. Stop oxybutynin. Update with response.       Relevant Medications   solifenacin (VESICARE) 5 MG tablet     Other   Prediabetes   Relevant Orders   Hemoglobin A1c   Iron deficiency anemia   Other Visit Diagnoses     Annual physical exam    -  Primary   Need for influenza vaccination       Relevant Orders   Flu Vaccine QUAD High Dose(Fluad) (Completed)   Screening for hyperlipidemia       Relevant Orders   Lipid panel       Screening: -- Blood pressure screen normal -- cholesterol screening: will obtain -- Weight screening: overweight: continue to monitor -- Diabetes Screening: will obtain -- Nutrition: Encouraged healthy diet  The ASCVD Risk score (Arnett DK, et al., 2019) failed to calculate for the following reasons:   The 2019 ASCVD risk score is only valid for ages 22 to 41   The patient has a prior MI or stroke diagnosis  -- Statin therapy for Age 65-75 with CVD risk >7.5%  Psych -- Depression screening (PHQ-9):     Safety -- tobacco screening: not using -- alcohol screening:  low-risk usage. -- no evidence of domestic violence or intimate partner violence.   Cancer Screening -- pap smear not collected per ASCCP guidelines -- family history of breast cancer screening: done. not at high risk. -- Mammogram -  up to date  - hx of  breast cancer -- Colon cancer (age 47+)--  aged out  Immunizations Immunization History  Administered Date(s) Administered   Fluad Quad(high Dose 65+) 01/06/2019, 11/14/2021   Influenza-Unspecified 01/22/2020   PFIZER(Purple Top)SARS-COV-2 Vaccination 04/06/2019, 05/03/2019, 02/17/2020   Tdap 08/03/2021    -- flu vaccine up to date -- TDAP q10 years up to date -- Shingles (age >26) not up to date - encouraged getting -- PPSV-23 (19-64 with chronic disease or smoking) declined -- PCV-13 (age >63) - one dose followed by PPSV-23 1 year later declined -- Covid-19 Vaccine up to date   Encouraged healthy diet and exercise. Encouraged regular vision and dental care.    Lesleigh Noe, MD

## 2021-12-04 ENCOUNTER — Telehealth: Payer: Self-pay | Admitting: Family Medicine

## 2021-12-04 NOTE — Telephone Encounter (Signed)
Patient came in and dropped off a form on behalf of her mom,and on behalf of her foster home that she has. Form was placed up front in Cod[facility] folder. She would like a phone call once completed,to pick up.

## 2021-12-06 DIAGNOSIS — M5416 Radiculopathy, lumbar region: Secondary | ICD-10-CM | POA: Diagnosis not present

## 2021-12-07 ENCOUNTER — Other Ambulatory Visit: Payer: Self-pay | Admitting: Family Medicine

## 2021-12-07 DIAGNOSIS — N3281 Overactive bladder: Secondary | ICD-10-CM

## 2021-12-08 NOTE — Telephone Encounter (Signed)
Awaiting call back regarding TB screening questions.   If negative, patient is healthy.

## 2021-12-11 NOTE — Telephone Encounter (Signed)
Pt answered no to all TB questions. Form with Alyse Low until informed who will be signing off on paperwork.

## 2021-12-11 NOTE — Telephone Encounter (Signed)
Paperwork given to and signed by Dr. Glori Bickers. Pt notified that its ready for pick up.

## 2021-12-15 DIAGNOSIS — I10 Essential (primary) hypertension: Secondary | ICD-10-CM | POA: Diagnosis not present

## 2021-12-15 DIAGNOSIS — E785 Hyperlipidemia, unspecified: Secondary | ICD-10-CM | POA: Diagnosis not present

## 2021-12-15 DIAGNOSIS — K219 Gastro-esophageal reflux disease without esophagitis: Secondary | ICD-10-CM | POA: Diagnosis not present

## 2021-12-15 DIAGNOSIS — I2510 Atherosclerotic heart disease of native coronary artery without angina pectoris: Secondary | ICD-10-CM | POA: Diagnosis not present

## 2022-01-10 DIAGNOSIS — E785 Hyperlipidemia, unspecified: Secondary | ICD-10-CM | POA: Diagnosis not present

## 2022-01-10 DIAGNOSIS — I10 Essential (primary) hypertension: Secondary | ICD-10-CM | POA: Diagnosis not present

## 2022-02-04 ENCOUNTER — Telehealth: Payer: Self-pay

## 2022-02-04 NOTE — Telephone Encounter (Signed)
Received refill request for Oxybutynin. Please call patient to set up [facility] appointment. Once scheduled let me know I can submit for approval on refill.

## 2022-02-05 ENCOUNTER — Other Ambulatory Visit: Payer: Self-pay | Admitting: Family

## 2022-02-05 MED ORDER — OXYBUTYNIN CHLORIDE ER 5 MG PO TB24: 5.0000 mg | ORAL_TABLET | Freq: Every day | ORAL | 1 refills | Status: DC

## 2022-02-05 NOTE — Telephone Encounter (Signed)
Toc scheduled with Dugal on 04/19/22

## 2022-02-06 DIAGNOSIS — M5416 Radiculopathy, lumbar region: Secondary | ICD-10-CM | POA: Diagnosis not present

## 2022-03-02 DIAGNOSIS — M5416 Radiculopathy, lumbar region: Secondary | ICD-10-CM | POA: Diagnosis not present

## 2022-03-22 ENCOUNTER — Emergency Department (HOSPITAL_COMMUNITY): Payer: PPO

## 2022-03-22 ENCOUNTER — Emergency Department (HOSPITAL_COMMUNITY)
Admission: EM | Admit: 2022-03-22 | Discharge: 2022-03-22 | Disposition: A | Discharge status: Home / Self Care | Payer: PPO | Attending: Emergency Medicine | Admitting: Emergency Medicine

## 2022-03-22 ENCOUNTER — Other Ambulatory Visit: Payer: Self-pay

## 2022-03-22 DIAGNOSIS — Z79899 Other long term (current) drug therapy: Secondary | ICD-10-CM | POA: Insufficient documentation

## 2022-03-22 DIAGNOSIS — R509 Fever, unspecified: Secondary | ICD-10-CM | POA: Diagnosis not present

## 2022-03-22 DIAGNOSIS — I10 Essential (primary) hypertension: Secondary | ICD-10-CM | POA: Insufficient documentation

## 2022-03-22 DIAGNOSIS — R112 Nausea with vomiting, unspecified: Secondary | ICD-10-CM | POA: Diagnosis present

## 2022-03-22 DIAGNOSIS — Z853 Personal history of malignant neoplasm of breast: Secondary | ICD-10-CM | POA: Insufficient documentation

## 2022-03-22 DIAGNOSIS — U071 COVID-19: Secondary | ICD-10-CM | POA: Diagnosis not present

## 2022-03-22 DIAGNOSIS — R1110 Vomiting, unspecified: Secondary | ICD-10-CM | POA: Diagnosis not present

## 2022-03-22 DIAGNOSIS — A419 Sepsis, unspecified organism: Secondary | ICD-10-CM | POA: Diagnosis not present

## 2022-03-22 DIAGNOSIS — C50911 Malignant neoplasm of unspecified site of right female breast: Secondary | ICD-10-CM

## 2022-03-22 DIAGNOSIS — R000 Tachycardia, unspecified: Secondary | ICD-10-CM | POA: Diagnosis not present

## 2022-03-22 LAB — CBC WITH DIFFERENTIAL/PLATELET
Abs Immature Granulocytes: 0.03 10*3/uL (ref 0.00–0.07)
Basophils Absolute: 0 10*3/uL (ref 0.0–0.1)
Basophils Relative: 0 %
Eosinophils Absolute: 0 10*3/uL (ref 0.0–0.5)
Eosinophils Relative: 0 %
HCT: 34.5 % — ABNORMAL LOW (ref 36.0–46.0)
Hemoglobin: 11.7 g/dL — ABNORMAL LOW (ref 12.0–15.0)
Immature Granulocytes: 0 %
Lymphocytes Relative: 8 %
Lymphs Abs: 0.5 10*3/uL — ABNORMAL LOW (ref 0.7–4.0)
MCH: 32.6 pg (ref 26.0–34.0)
MCHC: 33.9 g/dL (ref 30.0–36.0)
MCV: 96.1 fL (ref 80.0–100.0)
Monocytes Absolute: 0.9 10*3/uL (ref 0.1–1.0)
Monocytes Relative: 13 %
Neutro Abs: 5.5 10*3/uL (ref 1.7–7.7)
Neutrophils Relative %: 79 %
Platelets: 176 10*3/uL (ref 150–400)
RBC: 3.59 MIL/uL — ABNORMAL LOW (ref 3.87–5.11)
RDW: 13.2 % (ref 11.5–15.5)
WBC: 7 10*3/uL (ref 4.0–10.5)
nRBC: 0 % (ref 0.0–0.2)

## 2022-03-22 LAB — COMPREHENSIVE METABOLIC PANEL
ALT: 16 U/L (ref 0–44)
AST: 21 U/L (ref 15–41)
Albumin: 4.4 g/dL (ref 3.5–5.0)
Alkaline Phosphatase: 38 U/L (ref 38–126)
Anion gap: 11 (ref 5–15)
BUN: 18 mg/dL (ref 8–23)
CO2: 22 mmol/L (ref 22–32)
Calcium: 9.4 mg/dL (ref 8.9–10.3)
Chloride: 103 mmol/L (ref 98–111)
Creatinine, Ser: 1.02 mg/dL — ABNORMAL HIGH (ref 0.44–1.00)
GFR, Estimated: 54 mL/min — ABNORMAL LOW (ref >60)
Glucose, Bld: 118 mg/dL — ABNORMAL HIGH (ref 70–99)
Potassium: 3.8 mmol/L (ref 3.5–5.1)
Sodium: 136 mmol/L (ref 135–145)
Total Bilirubin: 0.5 mg/dL (ref 0.3–1.2)
Total Protein: 7.7 g/dL (ref 6.5–8.1)

## 2022-03-22 LAB — RESP PANEL BY RT-PCR (RSV, FLU A&B, COVID)  RVPGX2
Influenza A by PCR: NEGATIVE
Influenza B by PCR: NEGATIVE
Resp Syncytial Virus by PCR: NEGATIVE
SARS Coronavirus 2 by RT PCR: POSITIVE — AB

## 2022-03-22 LAB — LACTIC ACID, PLASMA: Lactic Acid, Venous: 0.8 mmol/L (ref 0.5–1.9)

## 2022-03-22 LAB — LIPASE, BLOOD: Lipase: 30 U/L (ref 11–51)

## 2022-03-22 MED ORDER — LACTATED RINGERS IV BOLUS
1000.0000 mL | Freq: Once | INTRAVENOUS | Status: AC
  Administered 2022-03-22: 1000 mL via INTRAVENOUS

## 2022-03-22 MED ORDER — ACETAMINOPHEN 325 MG PO TABS
650.0000 mg | ORAL_TABLET | Freq: Once | ORAL | Status: AC | PRN
  Administered 2022-03-22: 650 mg via ORAL

## 2022-03-22 MED ORDER — ONDANSETRON 4 MG PO TBDP
4.0000 mg | ORAL_TABLET | Freq: Once | ORAL | Status: AC | PRN
  Administered 2022-03-22: 4 mg via ORAL
  Filled 2022-03-22: qty 1

## 2022-03-22 MED ORDER — ACETAMINOPHEN 325 MG PO TABS
650.0000 mg | ORAL_TABLET | Freq: Once | ORAL | Status: AC
  Administered 2022-03-22: 650 mg via ORAL
  Filled 2022-03-22: qty 2

## 2022-03-22 MED ORDER — PAXLOVID (150/100) 10 X 150 MG & 10 X 100MG PO TBPK: 2.0000 | ORAL_TABLET | Freq: Two times a day (BID) | ORAL | 0 refills | Status: AC

## 2022-03-22 MED ORDER — ONDANSETRON HCL 4 MG/2ML IJ SOLN
4.0000 mg | Freq: Once | INTRAMUSCULAR | Status: AC
  Administered 2022-03-22: 4 mg via INTRAVENOUS
  Filled 2022-03-22: qty 2

## 2022-03-22 MED ORDER — ACETAMINOPHEN 325 MG PO TABS
650.0000 mg | ORAL_TABLET | Freq: Once | ORAL | Status: DC | PRN
  Filled 2022-03-22: qty 2

## 2022-03-22 NOTE — ED Provider Notes (Signed)
[facility] DEPARTMENT Provider Note   CSN: [id number] Arrival date & time: 03/22/22  1215     History  Chief Complaint  Patient presents with   Weakness   Emesis      Wanda Green is a 88 year old female past medical history of breast cancer, atrial fibrillation, hypertension not currently on blood thinners secondary to side effects patient presents to the emergency department after 2 days of nausea and emesis.  She states that she was in her usual state of health however she had 2 grandchildren that were recently diagnosed with COVID-19.  She states that she is normally ambulatory but since the vomiting began she is felt increasingly weak.  Denies loss of consciousness, chest pain or shortness of breath.  She denies any cough patient she has been compliant with her medications and been tolerating p.o. intake however she feels that her urine output is slightly decreased.    Weakness Associated symptoms: vomiting   Emesis      Home Medications Prior to Admission medications   Medication Sig Start Date End Date Taking? Authorizing Provider  acetaminophen (TYLENOL) 500 MG tablet Take 1,000 mg by mouth daily as needed for headache, fever, moderate pain or mild pain.   Yes [provider]  amLODipine (NORVASC) 2.5 MG tablet Take 2.5 mg by mouth at bedtime. 11/15/14  Yes [provider]  atorvastatin (LIPITOR) 20 MG tablet Take 20 mg by mouth at bedtime.   Yes [provider]  losartan (COZAAR) 100 MG tablet Take 100 mg by mouth in the morning.   Yes [provider]  metoprolol succinate (TOPROL-XL) 25 MG 24 hr tablet Take 25 mg by mouth in the morning. Take '25mg'$  in the morning and '50mg'$  at night.   Yes [provider]  metoprolol succinate (TOPROL-XL) 50 MG 24 hr tablet Take 50 mg by mouth at bedtime. Take with or immediately following a meal.   Yes [provider]  Multiple Vitamin (MULTIVITAMIN)  tablet Take 1 tablet by mouth in the morning.   Yes [provider]  nirmatrelvir & ritonavir (PAXLOVID, 150/100,) 10 x 150 MG & 10 x '100MG'$  TBPK Take 2 tablets by mouth 2 (two) times daily for 5 days. 03/22/22 03/27/22 Yes Donzetta Matters, MD  omeprazole (PRILOSEC) 20 MG capsule Take 1 capsule (20 mg total) by mouth 2 (two) times daily. Patient taking differently: Take 20 mg by mouth daily as needed (heartburn). 11/14/21  Yes Waunita Schooner, MD  oxybutynin (DITROPAN-XL) 5 MG 24 hr tablet Take 1 tablet (5 mg total) by mouth at bedtime. Patient taking differently: Take 5 mg by mouth 3 (three) times daily. 02/05/22  Yes Dutch Quint B, FNP  Probiotic Product (PROBIOTIC DAILY PO) Take 1 tablet by mouth in the morning.   Yes [provider]  solifenacin (VESICARE) 5 MG tablet Take 1 tablet (5 mg total) by mouth daily. Patient not taking: Reported on 03/22/2022 11/14/21   Waunita Schooner, MD      Allergies    Lipitor [atorvastatin], Ultram [tramadol], Percocet [oxycodone-acetaminophen], Codeine, Hydrocodone, and Vibra-tab [doxycycline]    Review of Systems   Review of Systems  Gastrointestinal:  Positive for vomiting.  Neurological:  Positive for weakness.    Physical Exam Updated Vital Signs BP (!) 105/94   Pulse 83   Temp 98.5 F (36.9 C) (Oral)   Resp 18   Wt 72 kg   LMP 07/22/2015   SpO2 100%   BMI 29.51 kg/m  Physical Exam Vitals and nursing note reviewed.  Constitutional:      General: She is not in acute distress.    Appearance: She is well-developed.  HENT:     Head: Normocephalic and atraumatic.     Nose: No congestion or rhinorrhea.     Mouth/Throat:     Mouth: Mucous membranes are moist.  Eyes:     Conjunctiva/sclera: Conjunctivae normal.  Cardiovascular:     Rate and Rhythm: Normal rate and regular rhythm.     Heart sounds: No murmur heard. Pulmonary:     Effort: Pulmonary effort is normal. No respiratory distress.     Breath sounds: Normal breath  sounds.  Abdominal:     General: There is no distension.     Palpations: Abdomen is soft.     Tenderness: There is no abdominal tenderness. There is no right CVA tenderness or left CVA tenderness.  Musculoskeletal:        General: No swelling.     Cervical back: Neck supple.  Skin:    General: Skin is warm and dry.     Capillary Refill: Capillary refill takes less than 2 seconds.  Neurological:     Mental Status: She is alert.  Psychiatric:        Mood and Affect: Mood normal.     ED Results / Procedures / Treatments   Labs (all labs ordered are listed, but only abnormal results are displayed) Labs Reviewed  RESP PANEL BY RT-PCR (RSV, FLU A&B, COVID)  RVPGX2 - Abnormal; Notable for the following components:      Result Value   SARS Coronavirus 2 by RT PCR POSITIVE (*)    All other components within normal limits  COMPREHENSIVE METABOLIC PANEL - Abnormal; Notable for the following components:   Glucose, Bld 118 (*)    Creatinine, Ser 1.02 (*)    GFR, Estimated 54 (*)    All other components within normal limits  CBC WITH DIFFERENTIAL/PLATELET - Abnormal; Notable for the following components:   RBC 3.59 (*)    Hemoglobin 11.7 (*)    HCT 34.5 (*)    Lymphs Abs 0.5 (*)    All other components within normal limits  CULTURE, BLOOD (ROUTINE X 2)  CULTURE, BLOOD (ROUTINE X 2)  LACTIC ACID, PLASMA  LIPASE, BLOOD  LACTIC ACID, PLASMA  URINALYSIS, ROUTINE W REFLEX MICROSCOPIC    EKG EKG Interpretation  Date/Time:  Thursday March 22 2022 12:47:31 EST Ventricular Rate:  96 PR Interval:  140 QRS Duration: 72 QT Interval:  344 QTC Calculation: 434 R Axis:   38 Text Interpretation: Normal sinus rhythm Minimal voltage criteria for LVH, may be normal variant ( R in aVL ) Borderline ECG When compared with ECG of 22-Sep-2020 08:53, PREVIOUS ECG IS PRESENT when comapred to prior, faste rate and new t wave inversion in lead 3. ?S1Q3T3 pattern. No STEMI Confirmed by Antony Blackbird  [phone]) on 03/22/2022 5:27:18 PM  Radiology DG Chest 2 View  Result Date: 03/22/2022 CLINICAL DATA:  Body aches, fever, runny nose, nasal congestion and 2 episodes of vomiting over past few days, sepsis EXAM: CHEST - 2 VIEW COMPARISON:  09/22/2020 FINDINGS: Normal heart size, mediastinal contours, and pulmonary vascularity. Atherosclerotic calcification aorta. Chronic peribronchial thickening. No pulmonary infiltrate, pleural effusion, or pneumothorax. Osseous demineralization. Surgical clips RIGHT axilla. IMPRESSION: No acute abnormalities. Aortic Atherosclerosis (ICD10-I70.0). Electronically Signed   By: Lavonia Dana M.D.   On: 03/22/2022 14:04    Procedures Procedures  Medications Ordered in ED Medications  ondansetron (ZOFRAN-ODT) disintegrating tablet 4 mg (4 mg Oral Given 03/22/22 1302)  acetaminophen (TYLENOL) tablet 650 mg (650 mg Oral Given 03/22/22 1302)  acetaminophen (TYLENOL) tablet 650 mg (650 mg Oral Given 03/22/22 1853)  lactated ringers bolus 1,000 mL (0 mLs Intravenous Stopped 03/22/22 2121)  ondansetron (ZOFRAN) injection 4 mg (4 mg Intravenous Given 03/22/22 1852)    ED Course/ Medical Decision Making/ A&P                             Medical Decision Making IKEA DEMICCO is an 88 yo F PMH as documented above presenting to the emergency department for nausea and emesis as well as weakness in the setting of recent illness.  On arrival to the emergency department patient's blood pressure 140/80, temperature 102.3, pulse 94 respirations 20 satting 96% on room air.  Initial differential included pneumonia, sepsis, electrolyte abnormalities, syncope.  Most likely etiology is a viral illness in the setting of her myalgias arthralgias nausea and vomiting with known sick contacts however given the patient's age she is at risk for more serious underlying issues therefore on arrival to the emergency department extensive lab workup was obtained.  CBC shows no elevation of white  blood cell count with WBCs of 7.0, hemoglobin stable 11.7 platelets 176 no elevation in [place] CMP grossly unremarkable patient's creatinine is near baseline.  Respiratory viral panel did show that patient is positive for COVID-19.  Lipase was obtained given her vomiting and abdominal tenderness however it was not elevated making pancreatitis less likely patient's lactic acid was 0.8.  Patient's EKG shows borderline sinus tachycardia no elevation in ST segment or other gross interval abnormalities.  Given her lack of chest pain we did not obtain a BNP or troponin.  Patient's chest x-ray shows no evidence of focal consolidation.  Given the patient's lab workup showing no acute electrolyte abnormalities and a normal EKG and chest x-ray without concerning findings for an acute pneumonia I felt that the patient's etiology of symptoms is most likely in line with COVID-19.  And her underlying medical comorbidities she is a suitable candidate for Paxlovid pharmacy was consulted for medication recommendation management given her multiple other medications.  Patient is Norvasc, atorvastatin and Vesicare were held.  In the emergency department patient received a 1 L bolus of LR along with 2 doses of Tylenol and Zofran.  After these interventions patient's symptoms greatly improved she was able to tolerate p.o. intake given the fact that she remained hemodynamically stable with grossly nonconcerning labs in the emergency department I feel that she is suitable for outpatient management she will continue to monitor symptoms and take Paxlovid in the outpatient setting.       Problems Addressed: COVID: acute illness or injury  Amount and/or Complexity of Data Reviewed Labs: ordered.  Risk OTC drugs. Prescription drug management.           Final Clinical Impression(s) / ED Diagnoses Final diagnoses:  COVID    Rx / DC Orders ED Discharge Orders          Ordered    nirmatrelvir & ritonavir  (PAXLOVID, 150/100,) 10 x 150 MG & 10 x '100MG'$  TBPK  2 times daily        03/22/22 2250              Donzetta Matters, MD 03/23/22 0048    Tegeler, Gwenyth Allegra, MD 03/23/22  1108  

## 2022-03-22 NOTE — ED Notes (Signed)
Report given to oncoming RN.

## 2022-03-22 NOTE — ED Triage Notes (Signed)
Vomiting, weakness x 2 days. Pt lives with daughter and daughter states pt has not been acting herself. PT is A&O x 4

## 2022-03-22 NOTE — Discharge Instructions (Addendum)
You were seen in the emergency department today for your COVID-19 infection while in the emergency department completely full history and physical exam.  Based off of our findings we feel that you have COVID-19.    We are discharging you with a medication called Paxlovid he will take this medication 2 times daily for the next 5 days.  While on this medication please hold your atorvastatin, Norvasc, Vesicare.  All of your other medications have no interactions.  You develop worsening symptoms especially with loss of consciousness inability to breathe or vomiting that makes you unable to tolerate oral intake please return to the emergency department as soon as possible.  Sincerely, Clydie Braun, PGY-2

## 2022-03-22 NOTE — ED Provider Triage Note (Signed)
Emergency Medicine Provider Triage Evaluation Note  Wanda Green , a 88 y.o. female  was evaluated in triage.  Pt complains of bodyaches, subjective fever, runny nose, nasal congestion, and 2 episodes of vomiting over the past few days.  Daughter reports that she is just been having diffuse bodyaches and has not really been acting herself.  Took some Tylenol yesterday.  She denies any abdominal pain or any shortness of breath or chest pain.  Denies any pain with urination.  Review of Systems  Positive:  Negative:   Physical Exam  BP (!) 140/80 (BP Location: Left Arm)   Pulse 94   Temp (!) 102.3 F (39.1 C)   Resp 20   Wt 72 kg   LMP 07/22/2015   SpO2 96%   BMI 29.51 kg/m  Gen:   Awake, no distress   Resp:  Normal effort  MSK:   Moves extremities without difficulty  Other:  Abdomen soft and nontender.  Patient is alert.  Medical Decision Making  Medically screening exam initiated at 1:27 PM.  Appropriate orders placed.  Wanda Green was informed that the remainder of the evaluation will be completed by another provider, this initial triage assessment does not replace that evaluation, and the importance of remaining in the ED until their evaluation is complete.  Patient febrile at 102.3, pulse of 94, blood pressure is 140/80.  The patient was given Zofran and Tylenol for symptoms.  Sepsis orders placed. Charge alerted.    Sherrell Puller, PA-C 03/22/22 1329

## 2022-03-27 LAB — CULTURE, BLOOD (ROUTINE X 2)
Culture: NO GROWTH
Culture: NO GROWTH
Special Requests: ADEQUATE
Special Requests: ADEQUATE

## 2022-03-28 ENCOUNTER — Telehealth: Payer: Self-pay

## 2022-03-28 DIAGNOSIS — N3281 Overactive bladder: Secondary | ICD-10-CM

## 2022-03-28 NOTE — Telephone Encounter (Signed)
Can we clarify how pt is taking? Says once daily prescribed but was changed to three times daily just want to clarify before refilling.

## 2022-03-28 NOTE — Telephone Encounter (Signed)
Left message to return call to our office.  

## 2022-03-28 NOTE — Telephone Encounter (Signed)
Received rx refill fax [facility] for oxybutynin 5 mg tabs. LOV was with Dr. Einar Pheasant om 11/14/21 and has TOC with Eugenia Pancoast, FNP on 04/19/22.

## 2022-03-30 ENCOUNTER — Telehealth: Payer: Self-pay | Admitting: Family Medicine

## 2022-03-30 NOTE — Telephone Encounter (Signed)
Prescription Request  03/30/2022  Is this a "Controlled Substance" medicine? No  LOV: 11/14/2021  What is the name of the medication or equipment? oxybutynin (DITROPAN-XL) 5 MG 24 hr tablet   Have you contacted your pharmacy to request a refill? Yes   Which pharmacy would you like this sent to?  [name] [address] [place] [address]Phone: [phone]: [phone]   Patient notified that their request is being sent to the clinical staff for review and that they should receive a response within 2 business days.   Please advise at Mobile [phone](mobile)

## 2022-04-01 ENCOUNTER — Other Ambulatory Visit: Payer: Self-pay | Admitting: Family

## 2022-04-01 MED ORDER — OXYBUTYNIN CHLORIDE ER 5 MG PO TB24: 5.0000 mg | ORAL_TABLET | Freq: Every day | ORAL | 1 refills | Status: DC

## 2022-04-02 NOTE — Telephone Encounter (Signed)
Left message to return call to our office.  

## 2022-04-02 NOTE — Telephone Encounter (Signed)
Patient called back in and stated she is taking oxybutynin '5mg'$  three times daily.

## 2022-04-02 NOTE — Telephone Encounter (Signed)
Unable to reach patient. Left voicemail to return call to our office.   

## 2022-04-03 ENCOUNTER — Other Ambulatory Visit: Payer: Self-pay | Admitting: Family

## 2022-04-03 DIAGNOSIS — N3281 Overactive bladder: Secondary | ICD-10-CM

## 2022-04-03 MED ORDER — OXYBUTYNIN CHLORIDE 5 MG PO TABS: 5.0000 mg | ORAL_TABLET | Freq: Three times a day (TID) | ORAL | 5 refills | Status: DC

## 2022-04-03 NOTE — Addendum Note (Signed)
Addended by: Eugenia Pancoast on: 04/03/2022 12:29 PM   Modules accepted: Orders

## 2022-04-03 NOTE — Telephone Encounter (Signed)
Thanks for the clarification.  [facility].

## 2022-04-03 NOTE — Telephone Encounter (Signed)
Can we also see if she is still taking vesicare? Does she see a urology?

## 2022-04-03 NOTE — Telephone Encounter (Signed)
Pt stated that she is not taking vesicare. She said she tired it for 1 month and then went back to oxybutynin. Also she has not seen  urology in a long time.

## 2022-04-04 NOTE — Telephone Encounter (Signed)
You had addressed a message on this patient. She reported taking the oxybutynin tid. Wanted to make sure that is right message from P.Justin Mend wanted [place] to verify she is only taking one daily.

## 2022-04-05 NOTE — Telephone Encounter (Signed)
This has already been addressed.  Please advise padonda to disregard

## 2022-04-18 ENCOUNTER — Telehealth: Payer: Self-pay

## 2022-04-18 NOTE — Telephone Encounter (Signed)
[facility] Night - Client Nonclinical Telephone Record  [facility] Client [facility] Night - Client Client Site [facility] - Night Provider Renford Dills - MD Contact Type Call Who Is Calling Patient / Member / Family / Caregiver Caller Name [name] Phone Number [phone] Call Type Message Only Information Provided Reason for Call Returning a Call from the Office Initial [place] states she is returning a call from the office regarding her appt tomorrow. Additional Comment Provided office hours. Disp. Time Disposition Final User 04/18/2022 1:56:33 PM General Information Provided Yes [name], [name] Call Closed By: Marlou Sa Transaction Date/Time: 04/18/2022 1:55:00 PM (ET   Sending to [facility] support

## 2022-04-19 ENCOUNTER — Encounter: Payer: Self-pay | Admitting: Family

## 2022-04-19 ENCOUNTER — Ambulatory Visit (INDEPENDENT_AMBULATORY_CARE_PROVIDER_SITE_OTHER): Payer: PPO | Admitting: Family

## 2022-04-19 ENCOUNTER — Ambulatory Visit (INDEPENDENT_AMBULATORY_CARE_PROVIDER_SITE_OTHER)
Admission: RE | Admit: 2022-04-19 | Discharge: 2022-04-19 | Disposition: A | Discharge status: Home / Self Care | Payer: PPO | Source: Ambulatory Visit | Attending: Family | Admitting: Family

## 2022-04-19 VITALS — BP 150/78 | HR 78 | Temp 98.2°F | Ht 61.5 in | Wt 153.0 lb

## 2022-04-19 DIAGNOSIS — N3281 Overactive bladder: Secondary | ICD-10-CM | POA: Diagnosis not present

## 2022-04-19 DIAGNOSIS — R296 Repeated falls: Secondary | ICD-10-CM | POA: Diagnosis not present

## 2022-04-19 DIAGNOSIS — I10 Essential (primary) hypertension: Secondary | ICD-10-CM

## 2022-04-19 DIAGNOSIS — L03115 Cellulitis of right lower limb: Secondary | ICD-10-CM

## 2022-04-19 DIAGNOSIS — S8001XA Contusion of right knee, initial encounter: Secondary | ICD-10-CM | POA: Diagnosis not present

## 2022-04-19 DIAGNOSIS — S8991XA Unspecified injury of right lower leg, initial encounter: Secondary | ICD-10-CM

## 2022-04-19 DIAGNOSIS — N1831 Chronic kidney disease, stage 3a: Secondary | ICD-10-CM

## 2022-04-19 DIAGNOSIS — M7989 Other specified soft tissue disorders: Secondary | ICD-10-CM | POA: Diagnosis not present

## 2022-04-19 LAB — MICROALBUMIN / CREATININE URINE RATIO
Creatinine,U: 79.2 mg/dL
Microalb Creat Ratio: 1.3 mg/g (ref 0.0–30.0)
Microalb, Ur: 1 mg/dL (ref 0.0–1.9)

## 2022-04-19 MED ORDER — CEPHALEXIN 500 MG PO CAPS: 500.0000 mg | ORAL_CAPSULE | Freq: Three times a day (TID) | ORAL | 0 refills | Status: AC

## 2022-04-19 NOTE — Assessment & Plan Note (Signed)
Continue medications as prescribed Continue f/u with cardiologist as scheduled. Continue to monitor blood pressure periodically.

## 2022-04-19 NOTE — Assessment & Plan Note (Signed)
Xray ordered for today  Elevate leg for swelling ice/heat to site

## 2022-04-19 NOTE — Progress Notes (Signed)
Negative findings. Addressed in results of other same imaging

## 2022-04-19 NOTE — Assessment & Plan Note (Signed)
Pt advised to:  Work on decreasing fall risk factors at home to include throw rugs, hand bars in shower, mobile animals/pets

## 2022-04-19 NOTE — Assessment & Plan Note (Signed)
Continue medications as prescribed.  Stable.

## 2022-04-19 NOTE — Progress Notes (Addendum)
Established Patient Office Visit  Subjective:  Patient ID: Wanda Green, female    DOB: [birth year]  Age: 88 y.o. MRN: [id number]  CC:  Chief Complaint  Patient presents with   Establish Care    HPI Wanda Green is here for a transition of care visit.  Prior provider was: Dr. Waunita Schooner   Pt is with acute concerns.  [place] recently in the home, lives with her daughter. She states she went to get out of the bed, went around the corner and fell over the dog. She states   chronic concerns:  HLD taking losartan 100 mg once daily . Doing well, states blood pressure within range. Sees cardiologist every three months. Dr. Terrence Dupont . Also on metoprolol 25 mg in am 50 mg at night.   HLD: on atorvastatin 20 mg once daily, tolerating well. denies myalgias.  Lab Results  Component Value Date   CHOL 110 11/14/2021   HDL 51.60 11/14/2021   LDLCALC 44 11/14/2021   TRIG 72.0 11/14/2021   CHOLHDL 2 11/14/2021   Urine incontinence: on oxybutynin, stable. Controlled. Does not see a urologist.   Mild anemia, stable.  Lab Results  Component Value Date   WBC 7.0 03/22/2022   HGB 11.7 (L) 03/22/2022   HCT 34.5 (L) 03/22/2022   MCV 96.1 03/22/2022   PLT 176 03/22/2022   Lab Results  Component Value Date   IRON 63 11/14/2021   TIBC 275.8 11/14/2021   FERRITIN 24.2 11/14/2021   Prediabetes:  Lab Results  Component Value Date   HGBA1C 6.1 11/14/2021   Stable over the years.   CKD: Last metabolic panel, stable.  Lab Results  Component Value Date   GLUCOSE 118 (H) 03/22/2022   NA 136 03/22/2022   K 3.8 03/22/2022   CL 103 03/22/2022   CO2 22 03/22/2022   BUN 18 03/22/2022   CREATININE 1.02 (H) 03/22/2022   GFRNONAA 54 (L) 03/22/2022   CALCIUM 9.4 03/22/2022   PHOS 3.4 10/31/2018   PROT 7.7 03/22/2022   ALBUMIN 4.4 03/22/2022   LABGLOB 2.9 09/12/2015   AGRATIO 1.5 09/12/2015   BILITOT 0.5 03/22/2022   ALKPHOS 38 03/22/2022   AST 21 03/22/2022   ALT 16 03/22/2022    ANIONGAP 11 03/22/2022   H/o breast cancer: still sees oncologist every year for f/u.   Past Medical History:  Diagnosis Date   Cancer [facility]) 2000   right breast stage 2 chemo and radiation   Celiac artery stenosis (HCC)    followed by vascular--- dr Mamie Nick. [place] ([facility] in [place])   Chronic rhinitis    Chronic venous insufficiency    Claustrophobia    "SEVERE"   Claustrophobia    Complication of anesthesia    Depression    Dysrhythmia 2016   GERD (gastroesophageal reflux disease)    GI bleed 09/22/2020   Heart murmur    sees dr Terrence Dupont q 3 months mild murmur per pt   Hiatal hernia    History of adenomatous polyp of colon    History of atrial fibrillation    cardiologist--- dr Terrence Dupont---  first dx 2014,  per pt when at time of recurrent flare-up ischemic colitis in 2016 she was in normal rhythm   History of cancer chemotherapy 2000   for breast cancer   History of cervical dysplasia age 5   s/p  cervical cone bx   History of external beam radiation therapy 2000   right breast cancer  History of ischemic colitis    followed by dr Cristina Gong  (w/ [facility]ie Haber)  pt hx recurrent ischemic or infectious colitis   History of ischemic colitis    History of lower GI bleeding    History of right breast cancer oncologist--- dr Beryle Beams, Cassell Clement in epic07-19-2016 no recurrence, released prn   dx 01/ 2000,  right breast cancer, Stage II, positive 2 nodes, ER+/  s/p  right breast lumpectomy w/ sln bx's in 2000;  completed chemo/ radiation in 2000   Hyperlipidemia    Hypertension    IDA (iron deficiency anemia) 2020   Myocardial infarction ([place]) 2016   Neuromuscular disorder ([place])    neropathy lt leg   OA (osteoarthritis)    knees   OSA on CPAP cpap set on 10   followed by dr Elsworth Soho (pulmonology) study in epic 09-01-2012  milds osa   Osteoporosis 06/2016   T score -3.0   Peripheral vascular disease ([place])    vericose veins   PONV (postoperative nausea and vomiting)    Pre-diabetes    Pt denies    Right wrist fracture 04/22/2020    Past Surgical History:  Procedure Laterality Date   BREAST BIOPSY Right 2000   BREAST LUMPECTOMY WITH AXILLARY LYMPH NODE BIOPSY Right 2000   CARDIAC CATHETERIZATION N/A 11/25/2014   Procedure: Left Heart Cath and Coronary Angiography;  Surgeon: Dixie Dials, MD;  Location: [place] [facility];  Service: Cardiovascular;  Laterality: N/A;   CATARACT EXTRACTION W/ INTRAOCULAR LENS  IMPLANT, BILATERAL Bilateral 2018   CERVICAL CONE BIOPSY  age 69   COLONOSCOPY WITH PROPOFOL N/A 09/01/2014   Procedure: COLONOSCOPY WITH PROPOFOL;  Surgeon: Ronald Lobo, MD;  Location: [facility] END[facility];  Service: Endoscopy;  Laterality: N/A;  this will be done unprepped   KNEE ARTHROSCOPY Bilateral right 02/ 2003;  left 07/ 2005  @[facility]$    NASAL SINUS SURGERY  02/2013   including deviated septum repair   OPEN REDUCTION INTERNAL FIXATION (ORIF) DISTAL RADIAL FRACTURE Right 04/26/2020   Procedure: OPEN REDUCTION INTERNAL FIXATION (ORIF) RIGHT  DISTAL RADIAL FRACTURE;  Surgeon: [name], MD;  Location: [place];  Service: Orthopedics;  Laterality: Right;   ORIF WRIST FRACTURE Left 2001  @[facility]    TOTAL KNEE ARTHROPLASTY Left 04/09/2017   Procedure: LEFT TOTAL KNEE ARTHROPLASTY;  Surgeon: [name], MD;  Location: [place];  Service: Orthopedics;  Laterality: Left;   TOTAL KNEE ARTHROPLASTY Right 09/20/2020   Procedure: TOTAL KNEE ARTHROPLASTY;  Surgeon: [name], MD;  Location: [facility];  Service: Orthopedics;  Laterality: Right;   TUBAL LIGATION Bilateral 1975   VARICOSE VEIN SURGERY  05/2019   WRIST FRACTURE SURGERY Left ~ 2006    Family History  Problem Relation Age of Onset   Hypertension Mother    Heart failure Mother    Heart disease Mother    Hypertension Father    Heart disease Father    Hypertension Sister    Uterine cancer Sister    Hypertension Brother    Heart disease Brother     Social History   Socioeconomic History    Marital status: Widowed    Spouse name: Not on file   Number of children: 2   Years of education: High school   Highest education level: Not on file  Occupational History   Occupation: part time    Employer: [employer]  Tobacco Use   Smoking status: Former    Packs/day: 0.10    Years:  3.00    Total pack years: 0.30    Types: Cigarettes    Quit date: 03/05/1968    Years since quitting: 54.1   Smokeless tobacco: Never  Vaping Use   Vaping Use: Never used  Substance and Sexual Activity   Alcohol use: No    Alcohol/week: 0.0 standard drinks of alcohol   Drug use: No   Sexual activity: Not Currently    Birth control/protection: Post-menopausal  Other Topics Concern   Not on file  Social History Narrative   09/03/19   From: [place] originally, here since 1980    Living: with daughter Sharyn Lull and granddaughter   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, 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      Family: Arts development officer and Margreta Journey (lives in [place]) - one granddaughter      Enjoys: mow the yard, walking      Exercise: walking and exercise bike   Diet: healthy - no fats      Safety   Seat belts: Yes    Guns: No   Safe in relationships: Yes    Social Determinants of Radio broadcast assistant Strain: Low Risk  (07/21/2021)   Overall Financial Resource Strain (CARDIA)    Difficulty of Paying Living Expenses: Not hard at all  Food Insecurity: No Food Insecurity (07/21/2021)   Hunger Vital Sign    Worried About Running Out of Food in the Last Year: Never true    [place] in the Last Year: Never true  Transportation Needs: No Transportation Needs (07/21/2021)   PRAPARE - Hydrologist (Medical): No    Lack of Transportation (Non-Medical): No  Physical Activity: Insufficiently Active (07/21/2021)   Exercise Vital Sign    Days of Exercise per Week: 4 days    Minutes of Exercise per Session: 10 min  Stress: No  Stress Concern Present (07/21/2021)   [place]    Feeling of Stress : Not at all  Social Connections: Not on file  Intimate Partner Violence: Not on file    Outpatient Medications Prior to Visit  Medication Sig Dispense Refill   acetaminophen (TYLENOL) 500 MG tablet Take 1,000 mg by mouth daily as needed for headache, fever, moderate pain or mild pain.     amLODipine (NORVASC) 2.5 MG tablet Take 2.5 mg by mouth at bedtime.  3   atorvastatin (LIPITOR) 20 MG tablet Take 20 mg by mouth at bedtime.     losartan (COZAAR) 100 MG tablet Take 100 mg by mouth in the morning.     metoprolol succinate (TOPROL-XL) 25 MG 24 hr tablet Take 25 mg by mouth in the morning. Take 88m in the morning and 540mat night.     metoprolol succinate (TOPROL-XL) 50 MG 24 hr tablet Take 50 mg by mouth at bedtime. Take with or immediately following a meal.     Multiple Vitamin (MULTIVITAMIN) tablet Take 1 tablet by mouth in the morning.     omeprazole (PRILOSEC) 20 MG capsule Take 1 capsule (20 mg total) by mouth 2 (two) times daily. (Patient taking differently: Take 20 mg by mouth daily as needed (heartburn).) 180 capsule 1   oxybutynin (DITROPAN) 5 MG tablet Take 1 tablet (5 mg total) by mouth 3 (three) times daily. 90 tablet 5   Probiotic Product (PROBIOTIC DAILY PO) Take 1 tablet by mouth in the morning.  No facility-administered medications prior to visit.    Allergies  Allergen Reactions   Ultram [Tramadol] Nausea And Vomiting   Percocet [Oxycodone-Acetaminophen] Nausea And Vomiting   Codeine Nausea And Vomiting   Hydrocodone Nausea And Vomiting   Vibra-Tab [Doxycycline] Nausea And Vomiting     ROS: Pertinent symptoms negative unless otherwise noted in HPI       Objective:    Physical Exam Vitals reviewed.  Constitutional:      Appearance: Normal appearance. She is normal weight.  Eyes:     General:        Right eye: No  discharge.        Left eye: No discharge.     Conjunctiva/sclera: Conjunctivae normal.  Cardiovascular:     Rate and Rhythm: Normal rate.  Pulmonary:     Effort: Pulmonary effort is normal. No respiratory distress.  Musculoskeletal:     Cervical back: Normal range of motion.     Right knee: Swelling, effusion and erythema (right lateral knee) present. Decreased range of motion (due to pain hard to hyperextend or with flexion). Tenderness (point tenderness with patella and surrounding patella) present.     Right lower leg: Swelling (right lateral shin with ecchymosis), tenderness and bony tenderness present.  Skin:    Findings: Erythema (right lateral knee with effusion, tenderness to touch at patella and surrounding patella.) present.     Comments: Laceration with dried blood with surrounding erythema right upper patellar aspect  Neurological:     General: No focal deficit present.     Mental Status: She is alert and oriented to person, place, and time. Mental status is at baseline.  Psychiatric:        Mood and Affect: Mood normal.        Behavior: Behavior normal.        Thought Content: Thought content normal.        Judgment: Judgment normal.     3   BP (!) 150/78 Comment: right  Pulse 78   Temp 98.2 F (36.8 C) (Temporal)   Ht 5' 1.5" (1.562 m)   Wt 153 lb (69.4 kg)   LMP 07/22/2015   SpO2 99%   BMI 28.44 kg/m  Wt Readings from Last 3 Encounters:  04/19/22 153 lb (69.4 kg)  03/22/22 158 lb 11.7 oz (72 kg)  11/14/21 159 lb (72.1 kg)     There are no preventive care reminders to display for this patient.  There are no preventive care reminders to display for this patient.  Lab Results  Component Value Date   TSH 1.000 04/09/2012   Lab Results  Component Value Date   WBC 7.0 03/22/2022   HGB 11.7 (L) 03/22/2022   HCT 34.5 (L) 03/22/2022   MCV 96.1 03/22/2022   PLT 176 03/22/2022   Lab Results  Component Value Date   NA 136 03/22/2022   K 3.8  03/22/2022   CO2 22 03/22/2022   GLUCOSE 118 (H) 03/22/2022   BUN 18 03/22/2022   CREATININE 1.02 (H) 03/22/2022   BILITOT 0.5 03/22/2022   ALKPHOS 38 03/22/2022   AST 21 03/22/2022   ALT 16 03/22/2022   PROT 7.7 03/22/2022   ALBUMIN 4.4 03/22/2022   CALCIUM 9.4 03/22/2022   ANIONGAP 11 03/22/2022   GFR 44.57 (L) 11/14/2021   Lab Results  Component Value Date   CHOL 110 11/14/2021   Lab Results  Component Value Date   HDL 51.60 11/14/2021   Lab Results  Component  Value Date   LDLCALC 44 11/14/2021   Lab Results  Component Value Date   TRIG 72.0 11/14/2021   Lab Results  Component Value Date   CHOLHDL 2 11/14/2021   Lab Results  Component Value Date   HGBA1C 6.1 11/14/2021      Assessment & Plan:   Primary hypertension Assessment & Plan: Continue medications as prescribed Continue f/u with cardiologist as scheduled. Continue to monitor blood pressure periodically.    Orders: -     Microalbumin / creatinine urine ratio  Stage 3a chronic kidney disease ([place])  Multiple falls Assessment & Plan: Pt advised to:  Work on decreasing fall risk factors at home to include throw rugs, hand bars in shower, mobile animals/pets   Orders: -     DG Knee Complete 4 Views Right; Future -     DG Tibia/Fibula Right; Future  Right knee injury, initial encounter  Right leg injury, initial encounter Assessment & Plan: Xray ordered for today  Elevate leg for swelling ice/heat to site    Orders: -     DG Tibia/Fibula Right; Future -     WOUND CULTURE  Cellulitis of right knee Assessment & Plan: Pt advised to:  Please monitor site for worsening signs/symptoms of infection to include: increasing redness, increasing tenderness, increase in size, and or pustulant drainage from site. If this is to occur please let me know immediately.     Orders: -     Cephalexin; Take 1 capsule (500 mg total) by mouth 3 (three) times daily for 10 days.  Dispense: 30 capsule;  Refill: 0 -     WOUND CULTURE  Overactive bladder Assessment & Plan: Continue medications as prescribed.  Stable.       Meds ordered this encounter  Medications   cephALEXin (KEFLEX) 500 MG capsule    Sig: Take 1 capsule (500 mg total) by mouth 3 (three) times daily for 10 days.    Dispense:  30 capsule    Refill:  0    Order Specific Question:   Supervising Provider    Answer:   BEDSOLE, AMY E [id number]    Follow-up: Return in about 3 months (around 07/18/2022) for f/u blood pressure.    Eugenia Pancoast, FNP

## 2022-04-19 NOTE — Patient Instructions (Signed)
  Call orthopedist to get appointment.  Voltaren gel as needed and tylenol as needed Heat/ice to sit.  Elevate the leg at night time.   Welcome to our clinic, I am happy to have you as my new patient. I am excited to continue on this healthcare journey with you.  ------------------------------------  Stop by the lab prior to leaving today. I will notify you of your results once received.   Please keep in mind Any my chart messages you send have up to a three business day turnaround for a response.  Phone calls may take up to a one full business day turnaround for a  response.   If you need a medication refill I recommend you request it through the pharmacy as this is easiest for [place] rather than sending a message and or phone call.   Due to recent changes in healthcare laws, you may see results of your imaging and/or laboratory studies on MyChart before I have had a chance to review them.  I understand that in some cases there may be results that are confusing or concerning to you. Please understand that not all results are received at the same time and often I may need to interpret multiple results in order to provide you with the best plan of care or course of treatment. Therefore, I ask that you please give me 2 business days to thoroughly review all your results before contacting my office for clarification. Should we see a critical lab result, you will be contacted sooner.   It was a pleasure seeing you today! Please do not hesitate to reach out with any questions and or concerns.  Regards,   Eugenia Pancoast FNP-C

## 2022-04-19 NOTE — Assessment & Plan Note (Signed)
Pt advised to:  Please monitor site for worsening signs/symptoms of infection to include: increasing redness, increasing tenderness, increase in size, and or pustulant drainage from site. If this is to occur please let me know immediately.

## 2022-04-20 NOTE — Progress Notes (Signed)
Negative findings however was not a great discharge sample.

## 2022-04-23 ENCOUNTER — Other Ambulatory Visit: Payer: Self-pay | Admitting: Family

## 2022-04-23 DIAGNOSIS — S8991XA Unspecified injury of right lower leg, initial encounter: Secondary | ICD-10-CM

## 2022-04-23 DIAGNOSIS — Z22322 Carrier or suspected carrier of Methicillin resistant Staphylococcus aureus: Secondary | ICD-10-CM

## 2022-04-23 DIAGNOSIS — L03115 Cellulitis of right lower limb: Secondary | ICD-10-CM

## 2022-04-23 LAB — WOUND CULTURE
MICRO NUMBER:: 14570358
SPECIMEN QUALITY:: ADEQUATE

## 2022-04-23 MED ORDER — SULFAMETHOXAZOLE-TRIMETHOPRIM 800-160 MG PO TABS: 1.0000 | ORAL_TABLET | Freq: Two times a day (BID) | ORAL | 0 refills | Status: DC

## 2022-04-23 NOTE — Progress Notes (Signed)
Culture came back positive for MRSA Stop current antbx cephalexin as needs to change for coverage Start bactrim and take twice daily for seven days. How is knee worsening? Redness around knee? Have her schedule a f/u appt with me Friday is able otherwise early next week.

## 2022-04-24 ENCOUNTER — Telehealth: Payer: Self-pay | Admitting: Family

## 2022-04-24 NOTE — Telephone Encounter (Signed)
Patient called and said the generic for keflex made her extremely dizzy so she stopped, She said she had talked to the pharmacist and they said that wasn't a normal symptom. She stopped the medication and she hasn't had a problem since so she asked if something different can be called in. Call back [phone]

## 2022-04-24 NOTE — Telephone Encounter (Signed)
Spoke with patient and advised that there was a different medication sent for her to the pharmacy.

## 2022-04-26 DIAGNOSIS — M5416 Radiculopathy, lumbar region: Secondary | ICD-10-CM | POA: Diagnosis not present

## 2022-04-30 ENCOUNTER — Ambulatory Visit (INDEPENDENT_AMBULATORY_CARE_PROVIDER_SITE_OTHER): Payer: PPO | Admitting: Nurse Practitioner

## 2022-04-30 ENCOUNTER — Ambulatory Visit: Payer: PPO | Admitting: Family

## 2022-04-30 VITALS — BP 120/60 | HR 72 | Temp 97.6°F | Ht 61.5 in | Wt 153.0 lb

## 2022-04-30 DIAGNOSIS — Z22322 Carrier or suspected carrier of Methicillin resistant Staphylococcus aureus: Secondary | ICD-10-CM | POA: Diagnosis not present

## 2022-04-30 DIAGNOSIS — L03115 Cellulitis of right lower limb: Secondary | ICD-10-CM

## 2022-04-30 DIAGNOSIS — S8991XA Unspecified injury of right lower leg, initial encounter: Secondary | ICD-10-CM

## 2022-04-30 MED ORDER — SULFAMETHOXAZOLE-TRIMETHOPRIM 800-160 MG PO TABS: 1.0000 | ORAL_TABLET | Freq: Two times a day (BID) | ORAL | 0 refills | Status: AC

## 2022-04-30 NOTE — Patient Instructions (Signed)
Nice to see you today I have sent in 3 more days of the antibiotic See tabitha on Friday of this week

## 2022-04-30 NOTE — Assessment & Plan Note (Signed)
Patient ambulatory x-rays performed and were negative her primary care provider

## 2022-04-30 NOTE — Assessment & Plan Note (Signed)
Improvement with Septra.  Extend for 3 more days have patient follow-up with primary care in 4 days.  See clinical photo

## 2022-04-30 NOTE — Assessment & Plan Note (Signed)
Culture came back positive for MRSA.  Patient has been on Bactrim today is day 6 out of 7 we will extend for total of 10 days

## 2022-04-30 NOTE — Progress Notes (Signed)
Acute Office Visit  Subjective:     Patient ID: IMAAN RIPPER, female    DOB: [birth year], 88 y.o.   MRN: [id number]  Chief Complaint  Patient presents with   Knee Injury    Right knee doing little better     HPI Patient is in today for patient was seen in office on 04/19/2022 by Georgina Peer, NP.  Patient had complaints of multiple falls striking her knee per office note patient has some cellulitis of the right knee that was being treated with Keflex 500 mg 3 times daily pending wound culture.  Wound culture came back with resistance to Cipro, erythromycin, levofloxacin, oxacillin.  Patient was switched to Bactrim and is here today for a recheck.  States that she has been on the antibiotics for 6 days. States that she does feel like it is doing better. States that it feels fluid and it is still warm to the touch.   Review of Systems  Constitutional:  Negative for chills and fever.  Respiratory:  Negative for shortness of breath.   Cardiovascular:  Negative for chest pain.  Musculoskeletal:  Positive for joint pain.        Objective:    BP 120/60   Pulse 72   Temp 97.6 F (36.4 C) (Temporal)   Ht 5' 1.5" (1.562 m)   Wt 153 lb (69.4 kg)   LMP 07/22/2015   SpO2 99%   BMI 28.44 kg/m    Physical Exam Vitals and nursing note reviewed.  Constitutional:      Appearance: Normal appearance.  Cardiovascular:     Pulses:          Posterior tibial pulses are 1+ on the right side.  Musculoskeletal:        General: Tenderness present.       Legs:  Skin:    General: Skin is warm.     Findings: Erythema and lesion present.  Neurological:     Mental Status: She is alert.     No results found for any visits on 04/30/22.      Assessment & Plan:   Problem List Items Addressed This Visit       Other   Cellulitis of right knee - Primary    Improvement with Septra.  Extend for 3 more days have patient follow-up with primary care in 4 days.  See clinical photo       Relevant Medications   sulfamethoxazole-trimethoprim (BACTRIM DS) 800-160 MG tablet   Right leg injury, initial encounter    Patient ambulatory x-rays performed and were negative her primary care provider      Relevant Medications   sulfamethoxazole-trimethoprim (BACTRIM DS) 800-160 MG tablet   MRSA (methicillin resistant staph aureus) culture positive    Culture came back positive for MRSA.  Patient has been on Bactrim today is day 6 out of 7 we will extend for total of 10 days      Relevant Medications   sulfamethoxazole-trimethoprim (BACTRIM DS) 800-160 MG tablet    Meds ordered this encounter  Medications   sulfamethoxazole-trimethoprim (BACTRIM DS) 800-160 MG tablet    Sig: Take 1 tablet by mouth 2 (two) times daily for 3 days.    Dispense:  6 tablet    Refill:  0    Order Specific Question:   Supervising Provider    Answer:   Loura Pardon A [[id number]]    Return in about 4 days (around 05/04/2022) for wound recheck with tabithia.  Matt  Charmian Muff, NP

## 2022-05-04 ENCOUNTER — Ambulatory Visit (INDEPENDENT_AMBULATORY_CARE_PROVIDER_SITE_OTHER): Payer: PPO | Admitting: Family

## 2022-05-04 ENCOUNTER — Encounter: Payer: Self-pay | Admitting: Family

## 2022-05-04 VITALS — BP 128/64 | HR 64 | Temp 97.6°F | Ht 61.5 in | Wt 153.4 lb

## 2022-05-04 DIAGNOSIS — L03115 Cellulitis of right lower limb: Secondary | ICD-10-CM

## 2022-05-04 DIAGNOSIS — M25461 Effusion, right knee: Secondary | ICD-10-CM | POA: Diagnosis not present

## 2022-05-04 DIAGNOSIS — Z22322 Carrier or suspected carrier of Methicillin resistant Staphylococcus aureus: Secondary | ICD-10-CM

## 2022-05-04 LAB — CBC WITH DIFFERENTIAL/PLATELET
Basophils Absolute: 0 10*3/uL (ref 0.0–0.1)
Basophils Relative: 0.8 % (ref 0.0–3.0)
Eosinophils Absolute: 0.1 10*3/uL (ref 0.0–0.7)
Eosinophils Relative: 0.9 % (ref 0.0–5.0)
HCT: 30.2 % — ABNORMAL LOW (ref 36.0–46.0)
Hemoglobin: 10.2 g/dL — ABNORMAL LOW (ref 12.0–15.0)
Lymphocytes Relative: 15.2 % (ref 12.0–46.0)
Lymphs Abs: 0.9 10*3/uL (ref 0.7–4.0)
MCHC: 33.8 g/dL (ref 30.0–36.0)
MCV: 95.8 fl (ref 78.0–100.0)
Monocytes Absolute: 0.8 10*3/uL (ref 0.1–1.0)
Monocytes Relative: 13.4 % — ABNORMAL HIGH (ref 3.0–12.0)
Neutro Abs: 4.3 10*3/uL (ref 1.4–7.7)
Neutrophils Relative %: 69.7 % (ref 43.0–77.0)
Platelets: 209 10*3/uL (ref 150.0–400.0)
RBC: 3.16 Mil/uL — ABNORMAL LOW (ref 3.87–5.11)
RDW: 13.6 % (ref 11.5–15.5)
WBC: 6.1 10*3/uL (ref 4.0–10.5)

## 2022-05-04 MED ORDER — SULFAMETHOXAZOLE-TRIMETHOPRIM 800-160 MG PO TABS: 1.0000 | ORAL_TABLET | Freq: Two times a day (BID) | ORAL | 0 refills | Status: AC

## 2022-05-04 NOTE — Assessment & Plan Note (Signed)
Rx sent to patient's pharmacy for additional 4 days of Bactrim due to continued swelling and area being warm to touch.  CBC with diff ordered and pending. Will repeat imaging of right knee if symptoms do not improve or worsen.

## 2022-05-04 NOTE — Assessment & Plan Note (Signed)
Rx sent to patient's pharmacy for additional 4 days of Bactrim due to continued swelling and area being warm to touch.  CBC with diff ordered and pending. Patient to notify provider if there is no improvement, increased pain, swelling, redness, and warmth to area. Patient to also notify provider if she develops fever and/or chills.

## 2022-05-04 NOTE — Assessment & Plan Note (Addendum)
Rx sent to patient's pharmacy for additional 4 days of Bactrim due to continued swelling and area being warm to touch.  CBC with diff ordered and pending. Patient to notify provider if there is no improvement, increased pain, swelling, redness, and warmth to area. Patient to also notify provider if she develops fever and/or chills.

## 2022-05-04 NOTE — Progress Notes (Unsigned)
Established Patient Office Visit  Subjective:      CC:  Chief Complaint  Patient presents with   Knee Injury    Recheck knee wound    HPI: Wanda Green is a 88 y.o. female presenting on 05/04/2022 for Knee Injury (Recheck knee wound) . Right knee with wound as well as swelling as result of a fall. This was on 04/19/22. She returned to office 2/26 with Romilda Garret, FNP and she was evaluated as she was on 7 days of bactrim, and he extended it for three more days. Today is her last day of medication.  Wound culture 2/15 was positive for MRSA, we changed medication from keflex to bactrim which was also improved.   Knee xray on the 15th was neg for acute injury   New complaints: ***     Social history:  Relevant past medical, surgical, family and social history reviewed and updated as indicated. Interim medical history since our last visit reviewed.  Allergies and medications reviewed and updated.  DATA REVIEWED: CHART IN EPIC     ROS: Negative unless specifically indicated above in HPI.    Current Outpatient Medications:    acetaminophen (TYLENOL) 500 MG tablet, Take 1,000 mg by mouth daily as needed for headache, fever, moderate pain or mild pain., Disp: , Rfl:    amLODipine (NORVASC) 2.5 MG tablet, Take 2.5 mg by mouth at bedtime., Disp: , Rfl: 3   atorvastatin (LIPITOR) 20 MG tablet, Take 20 mg by mouth at bedtime., Disp: , Rfl:    losartan (COZAAR) 100 MG tablet, Take 100 mg by mouth in the morning., Disp: , Rfl:    metoprolol succinate (TOPROL-XL) 25 MG 24 hr tablet, Take 25 mg by mouth in the morning. Take '25mg'$  in the morning and '50mg'$  at night., Disp: , Rfl:    metoprolol succinate (TOPROL-XL) 50 MG 24 hr tablet, Take 50 mg by mouth at bedtime. Take with or immediately following a meal., Disp: , Rfl:    Multiple Vitamin (MULTIVITAMIN) tablet, Take 1 tablet by mouth in the morning., Disp: , Rfl:    omeprazole (PRILOSEC) 20 MG capsule, Take 1 capsule (20 mg total)  by mouth 2 (two) times daily. (Patient taking differently: Take 20 mg by mouth daily as needed (heartburn).), Disp: 180 capsule, Rfl: 1   oxybutynin (DITROPAN) 5 MG tablet, Take 1 tablet (5 mg total) by mouth 3 (three) times daily., Disp: 90 tablet, Rfl: 5   Probiotic Product (PROBIOTIC DAILY PO), Take 1 tablet by mouth in the morning., Disp: , Rfl:       Objective:    BP 128/64   Pulse 64   Temp 97.6 F (36.4 C) (Temporal)   Ht 5' 1.5" (1.562 m)   Wt 153 lb 6.4 oz (69.6 kg)   LMP 07/22/2015   SpO2 99%   BMI 28.52 kg/m   Wt Readings from Last 3 Encounters:  05/04/22 153 lb 6.4 oz (69.6 kg)  04/30/22 153 lb (69.4 kg)  04/19/22 153 lb (69.4 kg)    Physical Exam  Physical Exam Constitutional:      General: not in acute distress.    Appearance: Normal appearance. normal weight. is not ill-appearing, toxic-appearing or diaphoretic.  Cardiovascular:     Rate and Rhythm: Normal rate.  Pulmonary:     Effort: Pulmonary effort is normal.  Musculoskeletal:        General: Normal range of motion.  Neurological:     General: No focal deficit present.  Mental Status: alert and oriented to person, place, and time. Mental status is at baseline.  Psychiatric:        Mood and Affect: Mood normal.        Behavior: Behavior normal.        Thought Content: Thought content normal.        Judgment: Judgment normal.        Assessment & Plan:  Cellulitis of right knee     No follow-ups on file.  Eugenia Pancoast, MSN, APRN, FNP-C [place]

## 2022-05-04 NOTE — Progress Notes (Signed)
Established Patient Office Visit  Subjective:   Patient ID: Wanda Green, female    DOB: April 12, 1934  Age: 88 y.o. MRN: [id number]  CC:  Chief Complaint  Patient presents with   Knee Injury    Recheck knee wound    HPI: Wanda Green is a 88 y.o. female presenting today for follow-up for right knee cellulitis   Patient diagnosed with right knee cellulitis on 2/15 after multiple falls. Patient initially started on Keflex. Wound culture collected on 2/15 positive for MRSA. Antibiotic switched to Bactrim for 7 days. Patient seen in office on 2/26 for wound re-check. Per note on 2/26 wound improved with antibiotic and an additional 3 days of Bactrim was given. Patient's last day of antibiotic is today. Patient states improvement with right knee. States she has intermittent stabbing pain in knee that last a few seconds. Reports area continues to be warm to touch and have swelling, although swelling is improved.  Denies fever and chills.   Knee X-ray on 2/15 was negative.    ROS: Negative unless specifically indicated above in HPI.   Relevant past medical history reviewed and updated as indicated.   Allergies and medications reviewed and updated.   Current Outpatient Medications:    acetaminophen (TYLENOL) 500 MG tablet, Take 1,000 mg by mouth daily as needed for headache, fever, moderate pain or mild pain., Disp: , Rfl:    amLODipine (NORVASC) 2.5 MG tablet, Take 2.5 mg by mouth at bedtime., Disp: , Rfl: 3   atorvastatin (LIPITOR) 20 MG tablet, Take 20 mg by mouth at bedtime., Disp: , Rfl:    losartan (COZAAR) 100 MG tablet, Take 100 mg by mouth in the morning., Disp: , Rfl:    metoprolol succinate (TOPROL-XL) 25 MG 24 hr tablet, Take 25 mg by mouth in the morning. Take '25mg'$  in the morning and '50mg'$  at night., Disp: , Rfl:    metoprolol succinate (TOPROL-XL) 50 MG 24 hr tablet, Take 50 mg by mouth at bedtime. Take with or immediately following a meal., Disp: , Rfl:    Multiple  Vitamin (MULTIVITAMIN) tablet, Take 1 tablet by mouth in the morning., Disp: , Rfl:    omeprazole (PRILOSEC) 20 MG capsule, Take 1 capsule (20 mg total) by mouth 2 (two) times daily. (Patient taking differently: Take 20 mg by mouth daily as needed (heartburn).), Disp: 180 capsule, Rfl: 1   oxybutynin (DITROPAN) 5 MG tablet, Take 1 tablet (5 mg total) by mouth 3 (three) times daily., Disp: 90 tablet, Rfl: 5   Probiotic Product (PROBIOTIC DAILY PO), Take 1 tablet by mouth in the morning., Disp: , Rfl:    sulfamethoxazole-trimethoprim (BACTRIM DS) 800-160 MG tablet, Take 1 tablet by mouth 2 (two) times daily for 4 days., Disp: 8 tablet, Rfl: 0  Allergies  Allergen Reactions   Ultram [Tramadol] Nausea And Vomiting   Percocet [Oxycodone-Acetaminophen] Nausea And Vomiting   Codeine Nausea And Vomiting   Hydrocodone Nausea And Vomiting   Vibra-Tab [Doxycycline] Nausea And Vomiting    Objective:   BP 128/64   Pulse 64   Temp 97.6 F (36.4 C) (Temporal)   Ht 5' 1.5" (1.562 m)   Wt 153 lb 6.4 oz (69.6 kg)   LMP 07/22/2015   SpO2 99%   BMI 28.52 kg/m    Physical Exam Vitals reviewed.  Constitutional:      Appearance: Normal appearance. She is normal weight.  HENT:     Head: Normocephalic.  Cardiovascular:  Rate and Rhythm: Normal rate and regular rhythm.  Pulmonary:     Effort: Pulmonary effort is normal.     Breath sounds: Normal breath sounds.  Musculoskeletal:     Right knee: Swelling present. Decreased range of motion. Tenderness present.     Comments: Right knee is warm to touch with swelling on anterior and posterior knee. See photo on this date.   Neurological:     General: No focal deficit present.     Mental Status: She is alert and oriented to person, place, and time.  Psychiatric:        Mood and Affect: Mood normal.        Behavior: Behavior normal.        Thought Content: Thought content normal.        Judgment: Judgment normal.     Assessment & Plan:   Cellulitis of right knee Assessment & Plan: Rx sent to patient's pharmacy for additional 4 days of Bactrim due to continued swelling and area being warm to touch.  CBC with diff ordered and pending. Patient to notify provider if there is no improvement, increased pain, swelling, redness, and warmth to area. Patient to also notify provider if she develops fever and/or chills.   Orders: -     CBC with Differential/Platelet -     Sulfamethoxazole-Trimethoprim; Take 1 tablet by mouth 2 (two) times daily for 4 days.  Dispense: 8 tablet; Refill: 0  MRSA (methicillin resistant staph aureus) culture positive Assessment & Plan: Rx sent to patient's pharmacy for additional 4 days of Bactrim due to continued swelling and area being warm to touch.  CBC with diff ordered and pending. Patient to notify provider if there is no improvement, increased pain, swelling, redness, and warmth to area. Patient to also notify provider if she develops fever and/or chills.   Orders: -     CBC with Differential/Platelet -     Sulfamethoxazole-Trimethoprim; Take 1 tablet by mouth 2 (two) times daily for 4 days.  Dispense: 8 tablet; Refill: 0  Effusion of right knee Assessment & Plan: Rx sent to patient's pharmacy for additional 4 days of Bactrim due to continued swelling and area being warm to touch.  CBC with diff ordered and pending. Will repeat imaging of right knee if symptoms do not improve or worsen.   Orders: -     CBC with Differential/Platelet -     Sulfamethoxazole-Trimethoprim; Take 1 tablet by mouth 2 (two) times daily for 4 days.  Dispense: 8 tablet; Refill: 0     Follow up plan: Return in about 3 months (around 08/04/2022), or if symptoms worsen or fail to improve, for f/u blood pressure.  Johny Shock, RN AGNP-student

## 2022-05-15 ENCOUNTER — Other Ambulatory Visit: Payer: Self-pay

## 2022-05-15 DIAGNOSIS — K449 Diaphragmatic hernia without obstruction or gangrene: Secondary | ICD-10-CM

## 2022-05-15 NOTE — Telephone Encounter (Signed)
Received refill request last given by Dr. Einar Pheasant ok to refill as pended

## 2022-05-16 MED ORDER — OMEPRAZOLE 20 MG PO CPDR: 20.0000 mg | DELAYED_RELEASE_CAPSULE | Freq: Two times a day (BID) | ORAL | 1 refills | Status: DC

## 2022-06-08 DIAGNOSIS — Z1231 Encounter for screening mammogram for malignant neoplasm of breast: Secondary | ICD-10-CM | POA: Diagnosis not present

## 2022-06-08 LAB — HM MAMMOGRAPHY

## 2022-06-14 ENCOUNTER — Encounter: Payer: Self-pay | Admitting: Family

## 2022-06-14 ENCOUNTER — Ambulatory Visit (INDEPENDENT_AMBULATORY_CARE_PROVIDER_SITE_OTHER): Payer: PPO | Admitting: Family

## 2022-06-14 VITALS — BP 124/72 | HR 68 | Temp 97.9°F | Ht 61.5 in | Wt 151.2 lb

## 2022-06-14 DIAGNOSIS — M21611 Bunion of right foot: Secondary | ICD-10-CM | POA: Diagnosis not present

## 2022-06-14 DIAGNOSIS — L84 Corns and callosities: Secondary | ICD-10-CM

## 2022-06-14 DIAGNOSIS — N3941 Urge incontinence: Secondary | ICD-10-CM | POA: Diagnosis not present

## 2022-06-14 NOTE — Assessment & Plan Note (Signed)
Referral placed for podiatry as painful for eval/treat Did d/w her option to use otc callous/corn pads which may help temporarily

## 2022-06-14 NOTE — Assessment & Plan Note (Signed)
On oxybutynin three times daily, no longer working as well with worsening incontinence. Did see urology int he past but they retired. Referral placed for urology for further eval/treat Cautious to increase to QID due to age and CKD

## 2022-06-14 NOTE — Progress Notes (Signed)
Established Patient Office Visit  Subjective:   Patient ID: Wanda Green, female    DOB: 30-May-1934  Age: 88 y.o. MRN: [id number]  CC:  Chief Complaint  Patient presents with   Blister    Blister/Sore on bottom of the right foot. Also checking on right knee infection.    HPI: Wanda Green is a 88 y.o. female presenting on 06/14/2022 for Blister (Blister/Sore on bottom of the right foot. Also checking on right knee infection.)   HPI  Right lower foot has been troublesome causing some pain with walking, and it is a bunion on her left lateral foot of great toe. She has tried bunion pads without much relief.   She also has a corn on her base of right base of toe that has been causing her pain.    Urinary incontinence, worsening as of late. Having to wear daily pads to control it.  She states she has noticed this has worsened since prednisone. Worse especially over the past few months. At night time will feel the urge which wakes her up and has to get the bathroom before she has an accident, denies accidents at night.  She is on oxybutynin 5 mg tid.          ROS: Negative unless specifically indicated above in HPI.   Relevant past medical history reviewed and updated as indicated.   Allergies and medications reviewed and updated.   Current Outpatient Medications:    acetaminophen (TYLENOL) 500 MG tablet, Take 1,000 mg by mouth daily as needed for headache, fever, moderate pain or mild pain., Disp: , Rfl:    amLODipine (NORVASC) 2.5 MG tablet, Take 2.5 mg by mouth at bedtime., Disp: , Rfl: 3   atorvastatin (LIPITOR) 20 MG tablet, Take 20 mg by mouth at bedtime., Disp: , Rfl:    losartan (COZAAR) 100 MG tablet, Take 100 mg by mouth in the morning., Disp: , Rfl:    metoprolol succinate (TOPROL-XL) 25 MG 24 hr tablet, Take 25 mg by mouth in the morning. Take 25mg  in the morning and 50mg  at night., Disp: , Rfl:    metoprolol succinate (TOPROL-XL) 50 MG 24 hr tablet, Take  50 mg by mouth at bedtime. Take with or immediately following a meal., Disp: , Rfl:    Multiple Vitamin (MULTIVITAMIN) tablet, Take 1 tablet by mouth in the morning., Disp: , Rfl:    omeprazole (PRILOSEC) 20 MG capsule, Take 1 capsule (20 mg total) by mouth 2 (two) times daily., Disp: 180 capsule, Rfl: 1   oxybutynin (DITROPAN) 5 MG tablet, Take 1 tablet (5 mg total) by mouth 3 (three) times daily., Disp: 90 tablet, Rfl: 5   Probiotic Product (PROBIOTIC DAILY PO), Take 1 tablet by mouth in the morning., Disp: , Rfl:   Allergies  Allergen Reactions   Ultram [Tramadol] Nausea And Vomiting   Percocet [Oxycodone-Acetaminophen] Nausea And Vomiting   Codeine Nausea And Vomiting   Hydrocodone Nausea And Vomiting   Vibra-Tab [Doxycycline] Nausea And Vomiting    Objective:   BP 124/72 (BP Location: Left Arm)   Pulse 68   Temp 97.9 F (36.6 C) (Temporal)   Ht 5' 1.5" (1.562 m)   Wt 151 lb 3.2 oz (68.6 kg)   LMP 07/22/2015   SpO2 99%   BMI 28.11 kg/m    Physical Exam Constitutional:      General: She is not in acute distress.    Appearance: Normal appearance. She is normal weight. She is  not ill-appearing, toxic-appearing or diaphoretic.  HENT:     Head: Normocephalic.  Cardiovascular:     Rate and Rhythm: Normal rate.  Pulmonary:     Effort: Pulmonary effort is normal.  Musculoskeletal:        General: Normal range of motion.     Right foot: Bunion present.     Comments: Callous base of right great toe with tenderness when applying pressure  Neurological:     General: No focal deficit present.     Mental Status: She is alert and oriented to person, place, and time. Mental status is at baseline.  Psychiatric:        Mood and Affect: Mood normal.        Behavior: Behavior normal.        Thought Content: Thought content normal.        Judgment: Judgment normal.       Assessment & Plan:  Corns and callus Assessment & Plan: Referral placed for podiatry as painful for  eval/treat Did d/w her option to use otc callous/corn pads which may help temporarily    Orders: -     Ambulatory referral to Podiatry  Bunion of great toe of right foot -     Ambulatory referral to Podiatry  Urge incontinence of urine Assessment & Plan: On oxybutynin three times daily, no longer working as well with worsening incontinence. Did see urology int he past but they retired. Referral placed for urology for further eval/treat Cautious to increase to QID due to age and CKD  Orders: -     Ambulatory referral to Urology     Follow up plan: Return in about 6 months (around 12/14/2022) for f/u CPE.  Mort Sawyers, FNP

## 2022-06-14 NOTE — Patient Instructions (Addendum)
A referral was placed today for podiatry as well as urology.    Please let us know if you have not heard back within 2 weeks about the referral.   Regards,   Reinaldo Helt FNP-C

## 2022-06-20 ENCOUNTER — Ambulatory Visit: Payer: PPO | Admitting: Family

## 2022-07-03 ENCOUNTER — Encounter (HOSPITAL_BASED_OUTPATIENT_CLINIC_OR_DEPARTMENT_OTHER): Payer: Self-pay | Admitting: Pulmonary Disease

## 2022-07-03 ENCOUNTER — Ambulatory Visit (INDEPENDENT_AMBULATORY_CARE_PROVIDER_SITE_OTHER): Payer: PPO | Admitting: Pulmonary Disease

## 2022-07-03 VITALS — BP 160/80 | HR 70 | Ht 61.5 in | Wt 151.4 lb

## 2022-07-03 DIAGNOSIS — G4733 Obstructive sleep apnea (adult) (pediatric): Secondary | ICD-10-CM | POA: Diagnosis not present

## 2022-07-03 NOTE — Patient Instructions (Signed)
X change to auto CPAP 8 to 11 cm Trial of AirFit P10 nasal pillows for her

## 2022-07-03 NOTE — Progress Notes (Signed)
   Subjective:    Patient ID: RIELLE SCHLAUCH, female    DOB: 07-26-1934, 88 y.o.   MRN: [id number]  HPI  88 yo  for FU of  obstructive sleep apnea PMH - coronary artery disease and hypertension, AS  Chief Complaint  Patient presents with   Follow-up    No issues with machine takes off during night and doesn't put back on   Annual follow-up visit. She is maintained on CPAP 11 cm.  She occasionally takes her mask off at night and does not put it back on  Significant tests/ events reviewed 08/2012 HST AHI 7/h   03/2015 CPAP titration 11 cm   Review of Systems neg for any significant sore throat, dysphagia, itching, sneezing, nasal congestion or excess/ purulent secretions, fever, chills, sweats, unintended wt loss, pleuritic or exertional cp, hempoptysis, orthopnea pnd or change in chronic leg swelling. Also denies presyncope, palpitations, heartburn, abdominal pain, nausea, vomiting, diarrhea or change in bowel or urinary habits, dysuria,hematuria, rash, arthralgias, visual complaints, headache, numbness weakness or ataxia.     Objective:   Physical Exam  Gen. Pleasant, well-nourished, elderly in no distress ENT - no thrush, no pallor/icterus,no post nasal drip Neck: No JVD, no thyromegaly, no carotid bruits Lungs: no use of accessory muscles, no dullness to percussion, clear without rales or rhonchi  Cardiovascular: Rhythm regular, heart sounds  normal, no murmurs or gallops, no peripheral edema Musculoskeletal: No deformities, no cyanosis or clubbing         Assessment & Plan:

## 2022-07-03 NOTE — Assessment & Plan Note (Signed)
CPAP download was reviewed which shows good control of events 11 cm.  She has a large leak and compliance has decreased with occasional missed nights. We will change to auto settings 8 to 11 cm.  I discussed various mask interfaces with her and she may do better with nasal pillows.  We chose AirFit P10 nasal pillows for her. She will trial this interface and hopefully will be able to achieve better compliance  compliance with goal of at least 4-6 hrs every night is the expectation. Advised against medications with sedative side effects Cautioned against driving when sleepy - understanding that sleepiness will vary on a day to day basis

## 2022-07-11 ENCOUNTER — Ambulatory Visit: Payer: PPO | Admitting: Urology

## 2022-07-11 VITALS — BP 134/72 | HR 78 | Ht 61.5 in | Wt 152.2 lb

## 2022-07-11 DIAGNOSIS — N3941 Urge incontinence: Secondary | ICD-10-CM | POA: Diagnosis not present

## 2022-07-11 DIAGNOSIS — R339 Retention of urine, unspecified: Secondary | ICD-10-CM

## 2022-07-11 LAB — MICROSCOPIC EXAMINATION

## 2022-07-11 LAB — URINALYSIS, COMPLETE
Bilirubin, UA: NEGATIVE
Glucose, UA: NEGATIVE
Ketones, UA: NEGATIVE
Nitrite, UA: NEGATIVE
Protein,UA: NEGATIVE
RBC, UA: NEGATIVE
Specific Gravity, UA: 1.015 (ref 1.005–1.030)
Urobilinogen, Ur: 0.2 mg/dL (ref 0.2–1.0)
pH, UA: 6 (ref 5.0–7.5)

## 2022-07-11 LAB — BLADDER SCAN AMB NON-IMAGING: Scan Result: 191

## 2022-07-11 MED ORDER — MIRABEGRON ER 50 MG PO TB24: 50.0000 mg | ORAL_TABLET | Freq: Every day | ORAL | 11 refills | Status: DC

## 2022-07-11 NOTE — Progress Notes (Signed)
Marcelle Overlie Plume,acting as a scribe for Vanna Scotland, MD.,have documented all relevant documentation on the behalf of Vanna Scotland, MD,as directed by  Vanna Scotland, MD while in the presence of Vanna Scotland, MD.  07/11/2022 2:35 PM   Wanda Green 1934/03/25 [id number]  Referring provider: Mort Sawyers, FNP [address] [place],  [place] [address]  Chief Complaint  Patient presents with   Establish Care   Urinary Incontinence    HPI: 88 year-old female who is referred for further evaluation of urinary incontinence. She is currently on oxybutynin 5 mg, 3 times a day. Her symptoms seem to be worsening.  She has a history of breast cancer treated with chemotherapy in 2000, after which she began experiencing urinary leakage. She was informed that chemotherapy had deteriorated the bladder lining and was prescribed oxybutynin since.   Recently, her incontinence has worsened significantly, leading to the use of approximately eight incontinence pads daily. She also reports nocturia and occasional constipation, which is managed with prunes. The patient has tried Information systems manager without success and is seeking alternative treatments.  Her urinalysis today has 6-10 WBC's per high power field but is otherwise unremarkable.    Results for orders placed or performed in visit on 07/11/22  Bladder Scan (Post Void Residual) in office  Result Value Ref Range   Scan Result 191 ml       PMH: Past Medical History:  Diagnosis Date   Cancer (HCC) 2000   right breast stage 2 chemo and radiation   Celiac artery stenosis (HCC)    followed by vascular--- dr Demetrius Charity. levy ([facility] in [place])   Chronic rhinitis    Chronic venous insufficiency    Claustrophobia    "SEVERE"   Claustrophobia    Complication of anesthesia    Depression    Dysrhythmia 2016   GERD (gastroesophageal reflux disease)    GI bleed 09/22/2020   Heart murmur    sees dr Sharyn Lull q 3 months mild murmur per pt   Hiatal  hernia    History of adenomatous polyp of colon    History of atrial fibrillation    cardiologist--- dr Sharyn Lull---  first dx 2014,  per pt when at time of recurrent flare-up ischemic colitis in 2016 she was in normal rhythm   History of cancer chemotherapy 2000   for breast cancer   History of cervical dysplasia age 36   s/p  cervical cone bx   History of external beam radiation therapy 2000   right breast cancer   History of ischemic colitis    followed by dr Matthias Hughs  (w/ Deboraha Sprang)  pt hx recurrent ischemic or infectious colitis   History of ischemic colitis    History of lower GI bleeding    History of right breast cancer oncologist--- dr Cyndie Chime, Theron Arista in epic07-19-2016 no recurrence, released prn   dx 01/ 2000,  right breast cancer, Stage II, positive 2 nodes, ER+/  s/p  right breast lumpectomy w/ sln bx's in 2000;  completed chemo/ radiation in 2000   Hyperlipidemia    Hypertension    IDA (iron deficiency anemia) 2020   Myocardial infarction (HCC) 2016   Neuromuscular disorder (HCC)    neropathy lt leg   OA (osteoarthritis)    knees   OSA on CPAP cpap set on 10   followed by dr Vassie Loll (pulmonology) study in epic 09-01-2012  milds osa   Osteoporosis 06/2016   T score -3.0   Peripheral  vascular disease (HCC)    vericose veins   PONV (postoperative nausea and vomiting)    Pre-diabetes    Pt denies   Right wrist fracture 04/22/2020    Surgical History: Past Surgical History:  Procedure Laterality Date   BREAST BIOPSY Right 2000   BREAST LUMPECTOMY WITH AXILLARY LYMPH NODE BIOPSY Right 2000   CARDIAC CATHETERIZATION N/A 11/25/2014   Procedure: Left Heart Cath and Coronary Angiography;  Surgeon: Orpah Cobb, MD;  Location: [facility];  Service: Cardiovascular;  Laterality: N/A;   CATARACT EXTRACTION W/ INTRAOCULAR LENS  IMPLANT, BILATERAL Bilateral 2018   CERVICAL CONE BIOPSY  age 40   COLONOSCOPY WITH PROPOFOL N/A 09/01/2014   Procedure: COLONOSCOPY WITH PROPOFOL;   Surgeon: Bernette Redbird, MD;  [facility] ENDOSCOPY;  Service: Endoscopy;  Laterality: N/A;  this will be done unprepped   KNEE ARTHROSCOPY Bilateral right 02/ 2003;  left 07/ 2005  @[facility]    NASAL SINUS SURGERY  02/2013   including deviated septum repair   OPEN REDUCTION INTERNAL FIXATION (ORIF) DISTAL RADIAL FRACTURE Right 04/26/2020   Procedure: OPEN REDUCTION INTERNAL FIXATION (ORIF) RIGHT  DISTAL RADIAL FRACTURE;  Surgeon: Sheral Apley, MD;  Location[facility];  Service: Orthopedics;  Laterality: Right;   ORIF WRIST FRACTURE Left 2001  @[facility]    TOTAL KNEE ARTHROPLASTY Left 04/09/2017   Procedure: LEFT TOTAL KNEE ARTHROPLASTY;  Surgeon: Sheral Apley, MD;  Location: [facility];  Service: Orthopedics;  Laterality: Left;   TOTAL KNEE ARTHROPLASTY Right 09/20/2020   Procedure: TOTAL KNEE ARTHROPLASTY;  Surgeon: Sheral Apley, MD;  Location: [facility];  Service: Orthopedics;  Laterality: Right;   TUBAL LIGATION Bilateral 1975   VARICOSE VEIN SURGERY  05/2019   WRIST FRACTURE SURGERY Left ~ 2006    Home Medications:  Allergies as of 07/11/2022       Reactions   Ultram [tramadol] Nausea And Vomiting   Percocet [oxycodone-acetaminophen] Nausea And Vomiting   Codeine Nausea And Vomiting   Hydrocodone Nausea And Vomiting   Vibra-tab [doxycycline] Nausea And Vomiting        Medication List        Accurate as of Jul 11, 2022  2:35 PM. If you have any questions, ask your nurse or doctor.          STOP taking these medications    oxybutynin 5 MG tablet Commonly known as: DITROPAN       TAKE these medications    acetaminophen 500 MG tablet Commonly known as: TYLENOL Take 1,000 mg by mouth daily as needed for headache, fever, moderate pain or mild pain.   amLODipine 2.5 MG tablet Commonly known as: NORVASC Take 2.5 mg by mouth at bedtime.   atorvastatin 20 MG tablet Commonly known as: LIPITOR Take 20 mg by mouth at bedtime.   losartan 100 MG  tablet Commonly known as: COZAAR Take 100 mg by mouth in the morning.   metoprolol succinate 25 MG 24 hr tablet Commonly known as: TOPROL-XL Take 25 mg by mouth in the morning. Take 25mg  in the morning and 50mg  at night.   metoprolol succinate 50 MG 24 hr tablet Commonly known as: TOPROL-XL Take 50 mg by mouth at bedtime. Take with or immediately following a meal.   mirabegron ER 50 MG Tb24 tablet Commonly known as: MYRBETRIQ Take 1 tablet (50 mg total) by mouth daily.   multivitamin tablet Take 1 tablet by mouth in the morning.   omeprazole 20 MG capsule Commonly  known as: PRILOSEC Take 1 capsule (20 mg total) by mouth 2 (two) times daily.   PROBIOTIC DAILY PO Take 1 tablet by mouth in the morning.        Allergies:  Allergies  Allergen Reactions   Ultram [Tramadol] Nausea And Vomiting   Percocet [Oxycodone-Acetaminophen] Nausea And Vomiting   Codeine Nausea And Vomiting   Hydrocodone Nausea And Vomiting   Vibra-Tab [Doxycycline] Nausea And Vomiting    Family History: Family History  Problem Relation Age of Onset   Hypertension Mother    Heart failure Mother    Heart disease Mother    Hypertension Father    Heart disease Father    Hypertension Sister    Uterine cancer Sister    Hypertension Brother    Heart disease Brother     Social History:  reports that she quit smoking about 54 years ago. Her smoking use included cigarettes. She has a 0.30 pack-year smoking history. She has never used smokeless tobacco. She reports that she does not drink alcohol and does not use drugs.   Physical Exam: BP 134/72   Pulse 78   Ht 5' 1.5" (1.562 m)   Wt 152 lb 4 oz (69.1 kg)   LMP 07/22/2015   BMI 28.30 kg/m   Constitutional:  Alert and oriented, No acute distress. HEENT: [place] AT, moist mucus membranes.  Trachea midline, no masses. Neurologic: Grossly intact, no focal deficits, moving all 4 extremities. Psychiatric: Normal mood and affect.   Assessment & Plan:     1. Urinary incontinence - Severe with no evidence of infection as a contributing factor. She has no microscopic blood. - Chronic issue - No concern for underlying malignancy at this point in time - We discussed the OAB algorithm pathway - She has tried and failed the anti-cholinergics in the past and also with her advanced age, these are relatively contraindicated. - We will try Gemtesa and Myrbetric. Depending on her insurance formulary, we may have to adjust. We will start with Myrbetriq 50. - We also discussed second-line therapies such as Interstim, PTMS, and Botox today.  2. Incomplete bladder emptying - She does have  mildly elevated PVR and we will continue to monitor this   Return for medication management with PA and discuss response to treatment and next steps.  [facility] [address], [address] [place], [place] [address] [phone]

## 2022-07-19 IMAGING — DX DG KNEE 1-2V*R*
2 series · 2 of 2 positions shown · non-contrast
Comparison: None.

CLINICAL DATA: Status post right knee replacement

EXAM:
RIGHT KNEE - 2 VIEW

[knee ap]
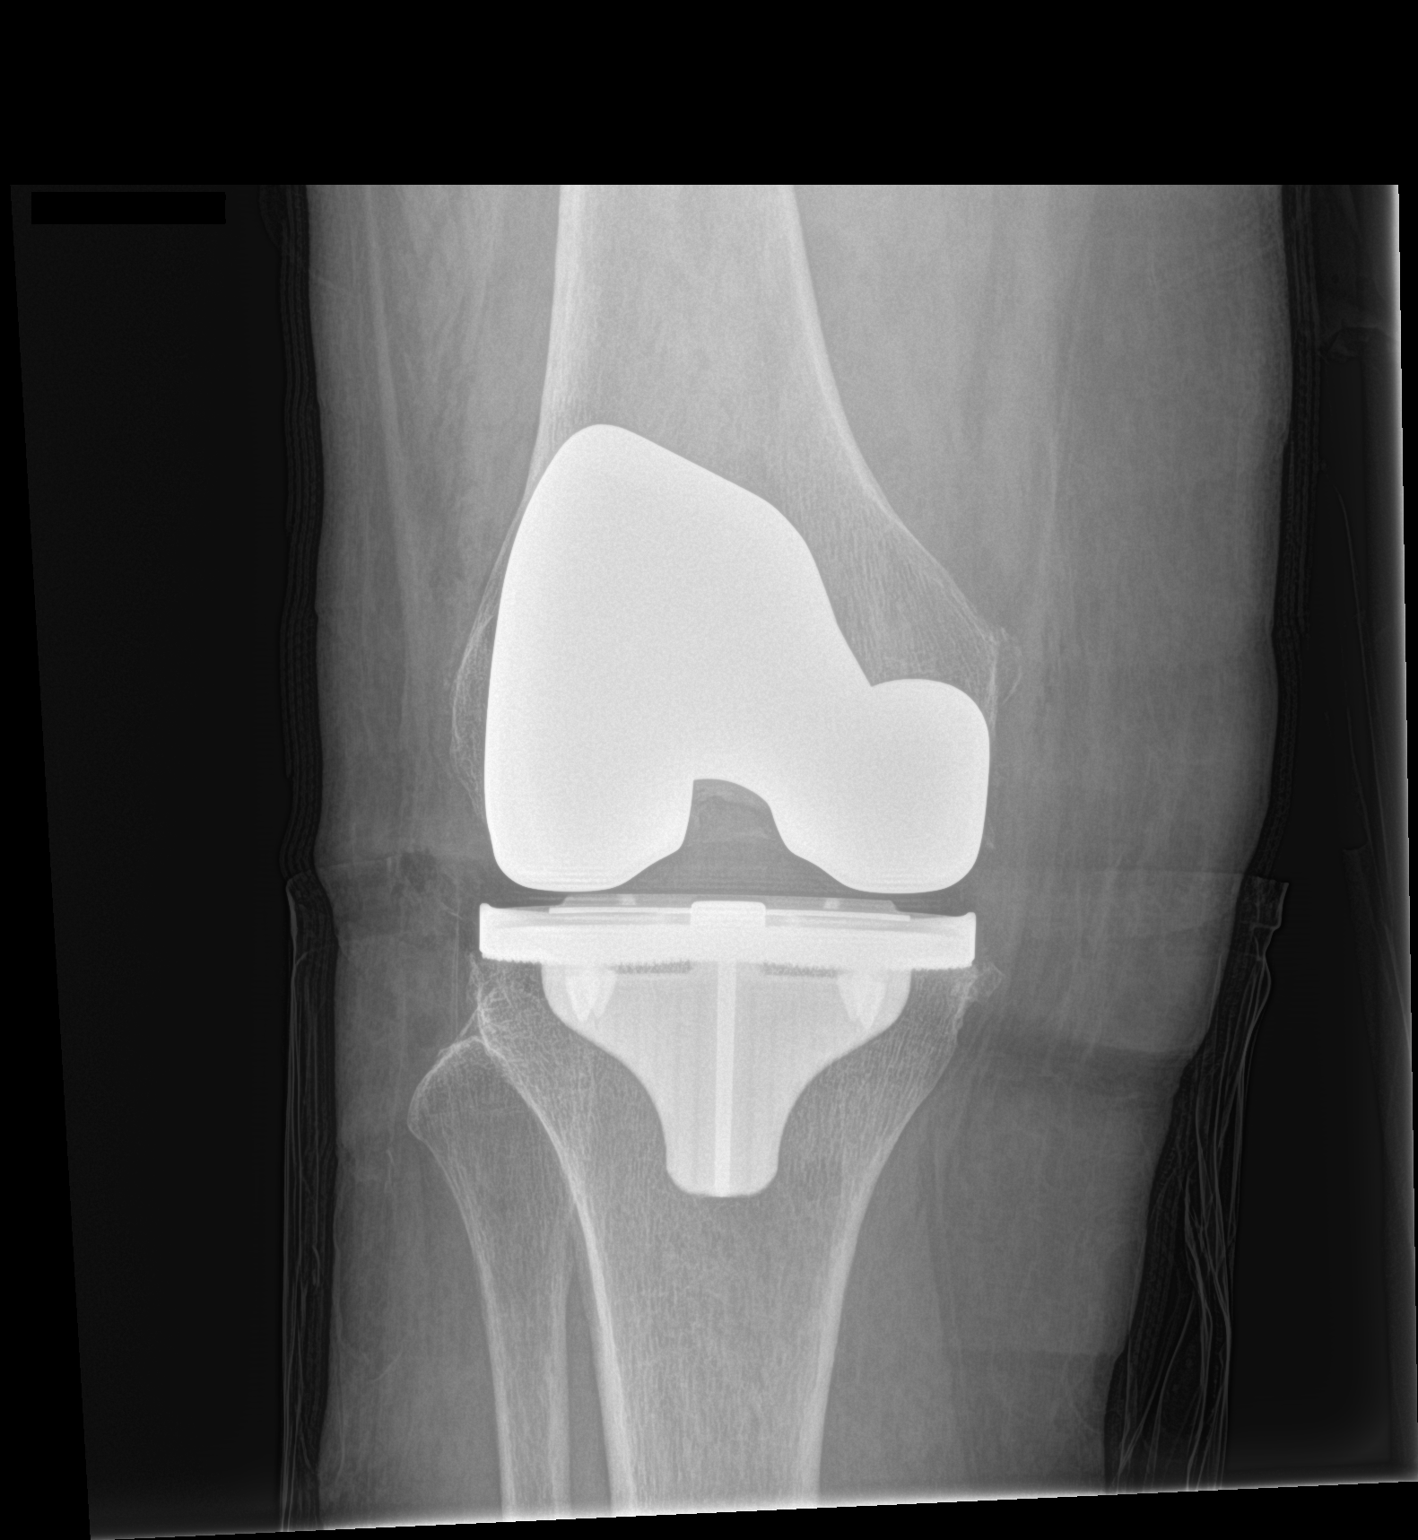

[knee lat]
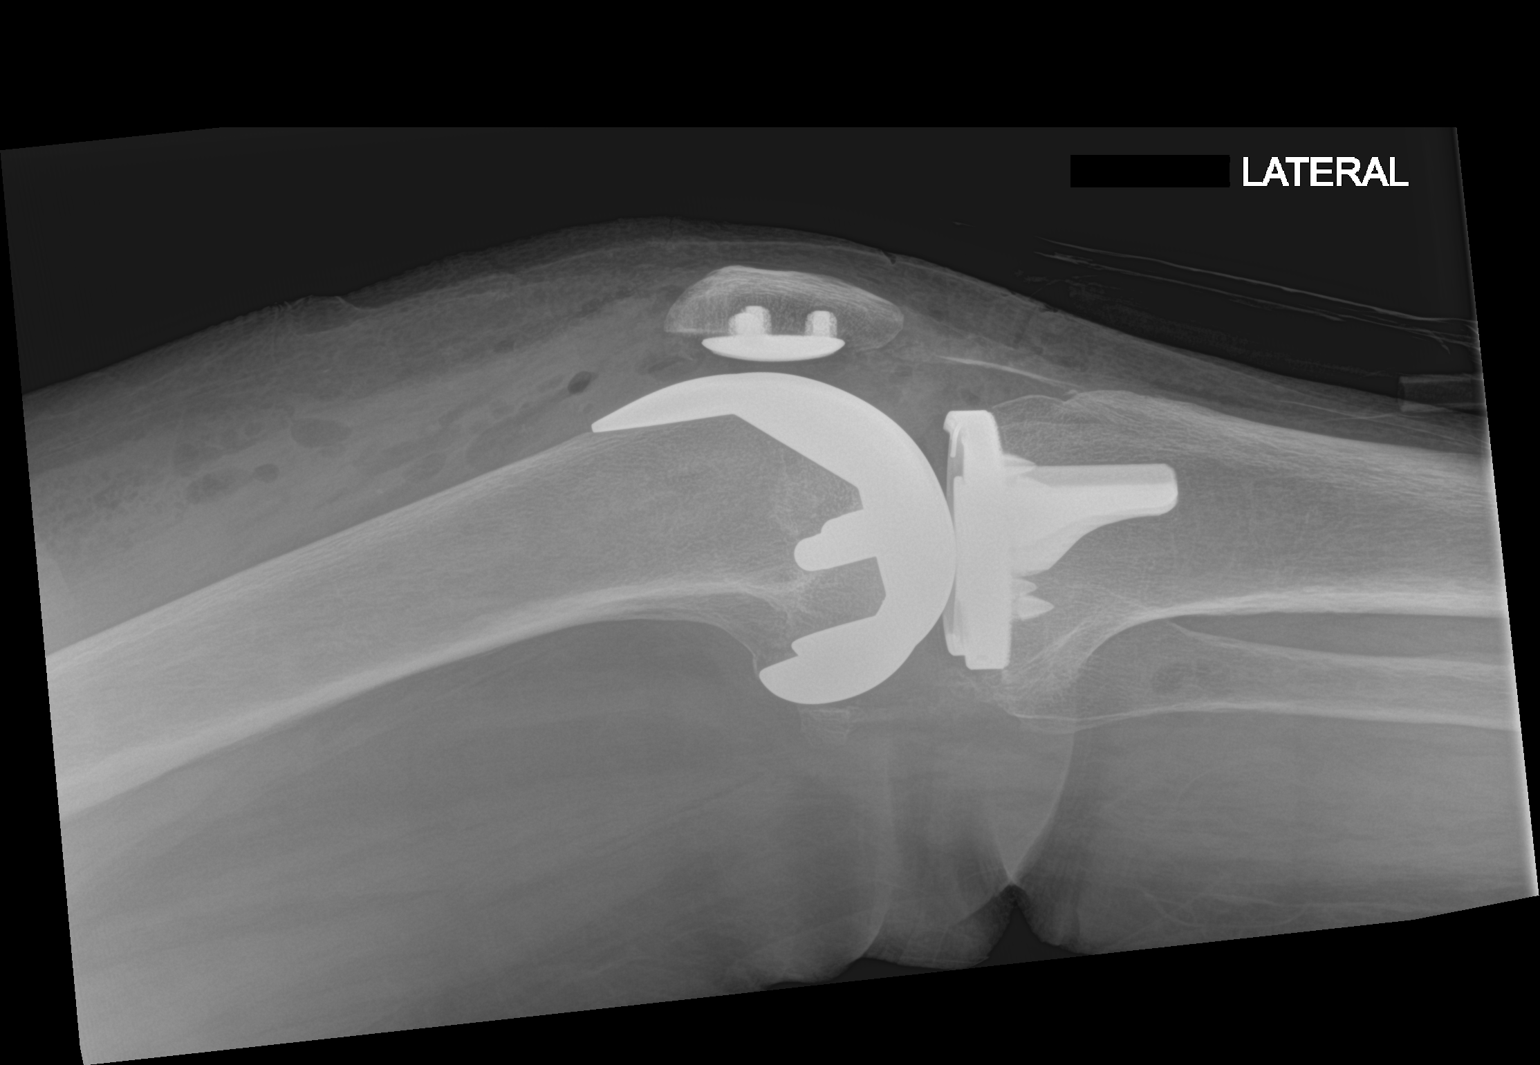

[2 of 2 positions shown; findings below may reference images not displayed]

FINDINGS: Right knee prosthesis is seen in satisfactory position. No acute
bony or soft tissue abnormality is noted.
IMPRESSION: Status post right knee replacement

## 2022-07-20 ENCOUNTER — Ambulatory Visit: Payer: PPO | Admitting: Podiatry

## 2022-07-20 DIAGNOSIS — D2371 Other benign neoplasm of skin of right lower limb, including hip: Secondary | ICD-10-CM | POA: Diagnosis not present

## 2022-07-20 NOTE — Progress Notes (Signed)
Chief Complaint  Patient presents with   Callouses    Right foot callus forefoot hallux, started month ago, rate of pain 10 out of 10,     Subjective: 88 y.o. female presenting to the office today for evaluation of a symptomatic skin lesion noted to the plantar aspect of the right forefoot underlying the first MTP.  Patient states that it is very painful to walk on.  She is unable to ambulate without pain.  Onset a few months ago.  Idiopathic.  She states that she has never had anything like this before. Patient states however that she does have a longstanding chronic history of right great toe pain secondary to her bunion.  She says that she is unable to wear most of her shoes because they seem to rub the great toe joint and elicit pain.  She has tried different shoe gear modifications with no relief.  She has contemplated surgery for several years.  She states that once the symptomatic callus to the plantar aspect of the great toe has resolved she would like to discuss possible correction for her right great toe pain   Past Medical History:  Diagnosis Date   Cancer (HCC) 2000   right breast stage 2 chemo and radiation   Celiac artery stenosis (HCC)    followed by vascular--- dr Demetrius Charity. levy ([facility] [place])   Chronic rhinitis    Chronic venous insufficiency    Claustrophobia    "SEVERE"   Claustrophobia    Complication of anesthesia    Depression    Dysrhythmia 2016   GERD (gastroesophageal reflux disease)    GI bleed 09/22/2020   Heart murmur    sees dr Sharyn Lull q 3 months mild murmur per pt   Hiatal hernia    History of adenomatous polyp of colon    History of atrial fibrillation    cardiologist--- dr Sharyn Lull---  first dx 2014,  per pt when at time of recurrent flare-up ischemic colitis in 2016 she was in normal rhythm   History of cancer chemotherapy 2000   for breast cancer   History of cervical dysplasia age 31   s/p  cervical cone bx   History of external beam radiation  therapy 2000   right breast cancer   History of ischemic colitis    followed by dr Matthias Hughs  (w/ Deboraha Sprang)  pt hx recurrent ischemic or infectious colitis   History of ischemic colitis    History of lower GI bleeding    History of right breast cancer oncologist--- dr Cyndie Chime, Theron Arista in epic07-19-2016 no recurrence, released prn   dx 01/ 2000,  right breast cancer, Stage II, positive 2 nodes, ER+/  s/p  right breast lumpectomy w/ sln bx's in 2000;  completed chemo/ radiation in 2000   Hyperlipidemia    Hypertension    IDA (iron deficiency anemia) 2020   Myocardial infarction (HCC) 2016   Neuromuscular disorder (HCC)    neropathy lt leg   OA (osteoarthritis)    knees   OSA on CPAP cpap set on 10   followed by dr Vassie Loll (pulmonology) study in epic 09-01-2012  milds osa   Osteoporosis 06/2016   T score -3.0   Peripheral vascular disease (HCC)    vericose veins   PONV (postoperative nausea and vomiting)    Pre-diabetes    Pt denies   Right wrist fracture 04/22/2020    Past Surgical History:  Procedure Laterality Date   BREAST BIOPSY Right 2000  BREAST LUMPECTOMY WITH AXILLARY LYMPH NODE BIOPSY Right 2000   CARDIAC CATHETERIZATION N/A 11/25/2014   Procedure: Left Heart Cath and Coronary Angiography;  Surgeon: Orpah Cobb, MD;  Location: [facility];  Service: Cardiovascular;  Laterality: N/A;   CATARACT EXTRACTION W/ INTRAOCULAR LENS  IMPLANT, BILATERAL Bilateral 2018   CERVICAL CONE BIOPSY  age 59   COLONOSCOPY WITH PROPOFOL N/A 09/01/2014   Procedure: COLONOSCOPY WITH PROPOFOL;  Surgeon: Bernette Redbird, MD;  Location[facility];  Service: Endoscopy;  Laterality: N/A;  this will be done unprepped   KNEE ARTHROSCOPY Bilateral right 02/ 2003;  left 07/ 2005  @[facility]    NASAL SINUS SURGERY  02/2013   including deviated septum repair   OPEN REDUCTION INTERNAL FIXATION (ORIF) DISTAL RADIAL FRACTURE Right 04/26/2020   Procedure: OPEN REDUCTION INTERNAL FIXATION (ORIF) RIGHT  DISTAL  RADIAL FRACTURE;  Surgeon: Sheral Apley, MD;  Location: [facility];  Service: Orthopedics;  Laterality: Right;   ORIF WRIST FRACTURE Left 2001  @[facility]    TOTAL KNEE ARTHROPLASTY Left 04/09/2017   Procedure: LEFT TOTAL KNEE ARTHROPLASTY;  Surgeon: Sheral Apley, MD;  Location: [facility];  Service: Orthopedics;  Laterality: Left;   TOTAL KNEE ARTHROPLASTY Right 09/20/2020   Procedure: TOTAL KNEE ARTHROPLASTY;  Surgeon: Sheral Apley, MD;  Location: [facility];  Service: Orthopedics;  Laterality: Right;   TUBAL LIGATION Bilateral 1975   VARICOSE VEIN SURGERY  05/2019   WRIST FRACTURE SURGERY Left ~ 2006    Allergies  Allergen Reactions   Ultram [Tramadol] Nausea And Vomiting   Percocet [Oxycodone-Acetaminophen] Nausea And Vomiting   Codeine Nausea And Vomiting   Hydrocodone Nausea And Vomiting   Vibra-Tab [Doxycycline] Nausea And Vomiting     Objective:  Physical Exam General: Alert and oriented x3 in no acute distress  Dermatology: Hyperkeratotic lesion(s) present on the plantar aspect of the first MTP of the right foot. Pain on palpation with a central nucleated core noted. Skin is warm, dry and supple bilateral lower extremities. Negative for open lesions or macerations.  Vascular: Palpable pedal pulses bilaterally. No edema or erythema noted. Capillary refill within normal limits.  Neurological: Grossly intact via light touch  Musculoskeletal Exam: Pain on palpation at the keratotic lesion(s) noted. Range of motion within normal limits bilateral. Muscle strength 5/5 in all groups bilateral.  Assessment: 1.  Symptomatic benign skin lesion plantar aspect of the first MTP right   Plan of Care:  1. Patient evaluated 2. Excisional debridement of keratoic lesion(s) using a chisel blade was performed without incident.  Salicylic acid applied 3. Dressed area with light dressing. 4.  Advised against going barefoot 5.  Return to clinic 4 weeks.  If she feels significant  improvement and reduction of her pain we will continue conservative care.  If she continues to have pain and tenderness to the right great toe we will take x-rays and discussed possible surgical treatment options for the right great toe joint which may include arthroplasty with implant.  X-rays next visit  Felecia Shelling, DPM [facility]  Dr. Felecia Shelling, DPM    [address], [place] [address]                Office [phone]  [phone]  Fax [phone]

## 2022-07-21 IMAGING — CT CT ANGIO CHEST
2 of 6 series · 19 of 36 positions shown · IV contrast (omnipaque)
Comparison: Chest x-ray from same day.

CLINICAL DATA: Chest pain and shortness of breath. Recent knee
replacement.

EXAM:
CT ANGIOGRAPHY CHEST WITH CONTRAST
TECHNIQUE: Multidetector CT imaging of the chest was performed using the
standard protocol during bolus administration of intravenous
contrast. Multiplanar CT image reconstructions and MIPs were
obtained to evaluate the vascular anatomy.
CONTRAST:  80mL OMNIPAQUE IOHEXOL 350 MG/ML SOLN

[Series 6: pe thins · axial · 0.75mm/px · z∈[+1261,+1490]mm · 18 of 365 slices shown]
[im 19/365  lung]
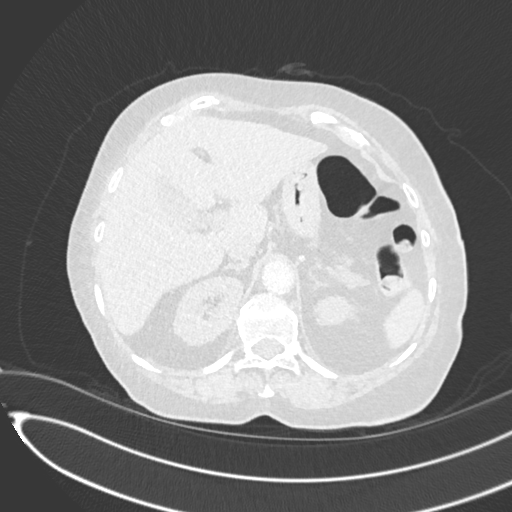
[im 37/365  mediastinal]
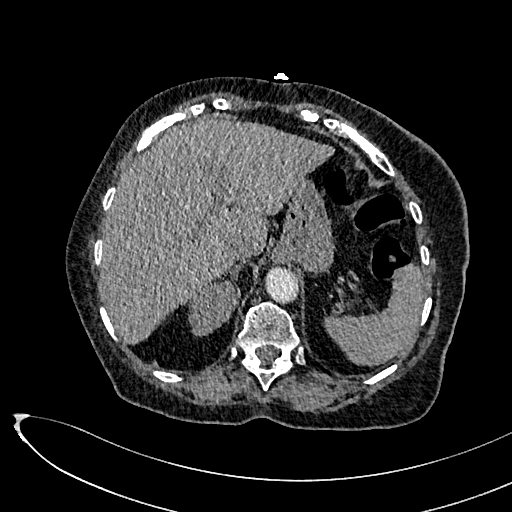
[im 55/365  lung]
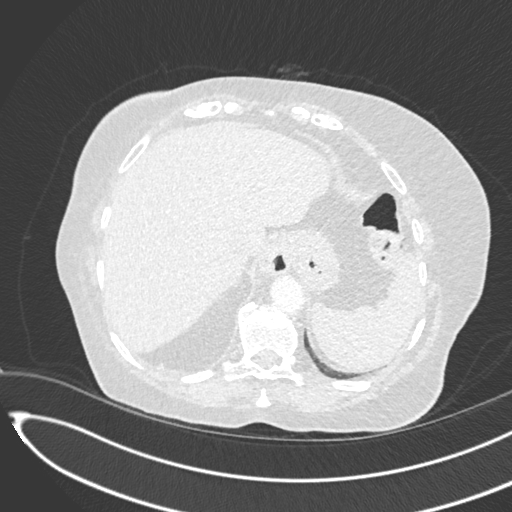
[im 73/365  mediastinal]
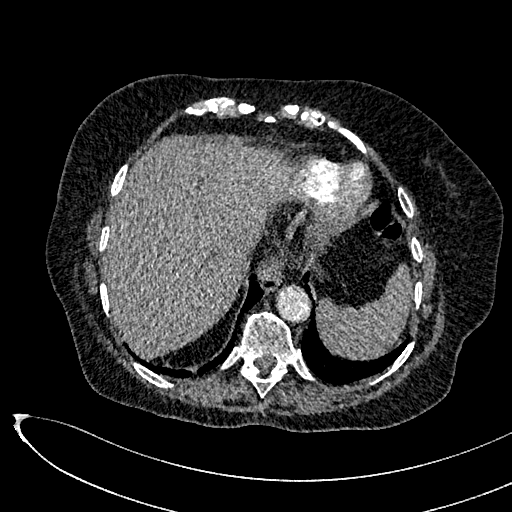
[im 92/365  lung]
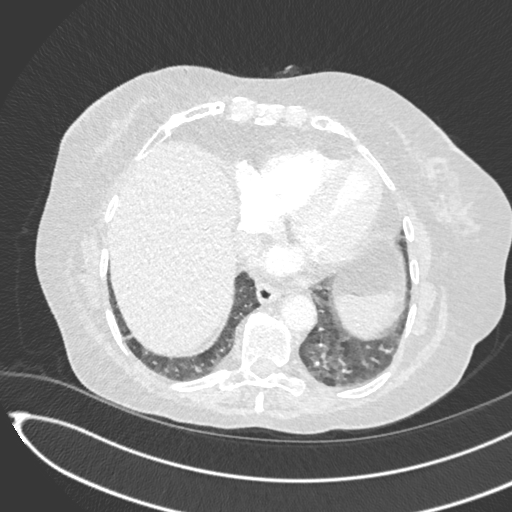
[im 110/365  mediastinal]
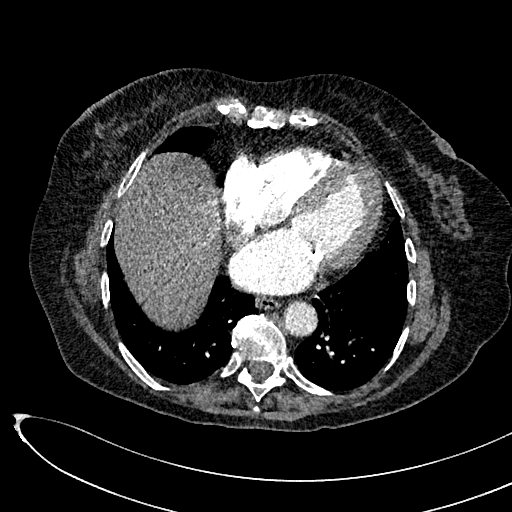
[im 128/365  lung]
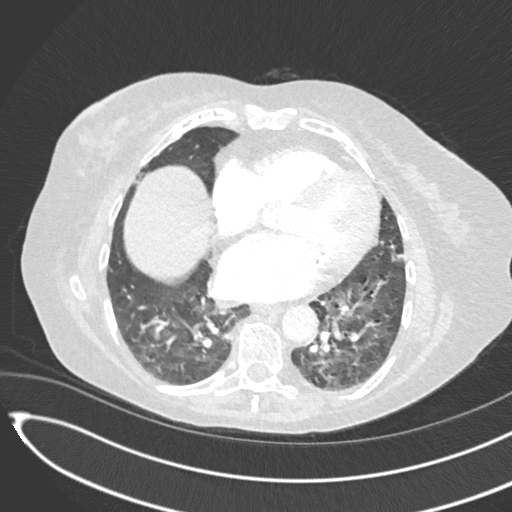
[im 146/365  mediastinal]
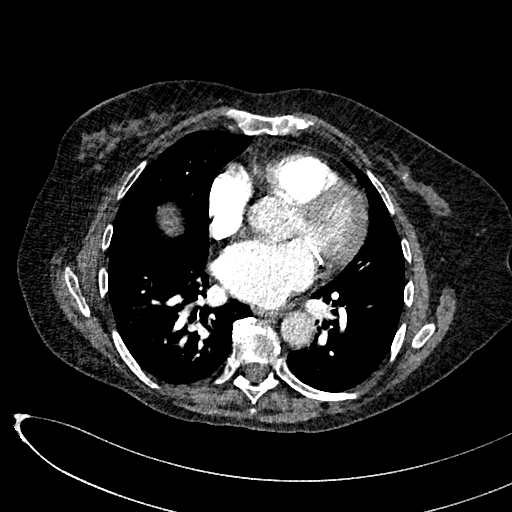
[im 164/365  lung]
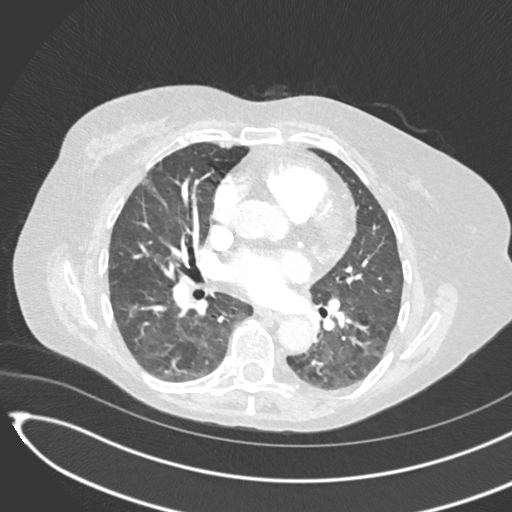
[im 201/365  mediastinal]
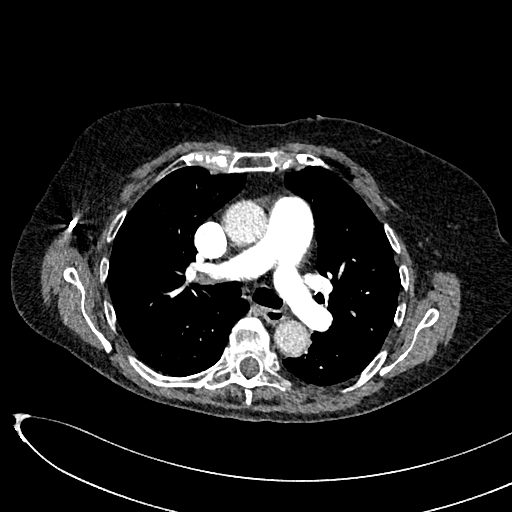
[im 219/365  lung]
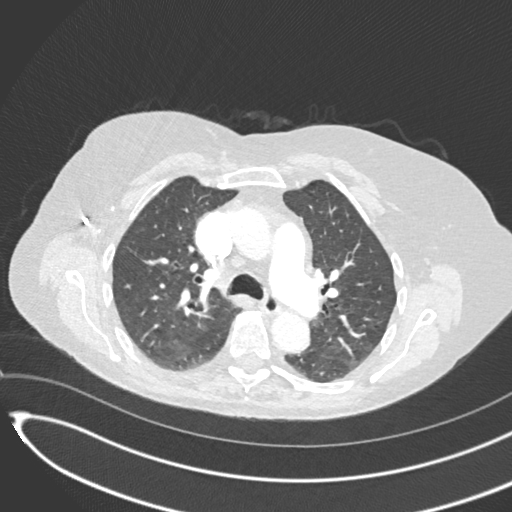
[im 237/365  mediastinal]
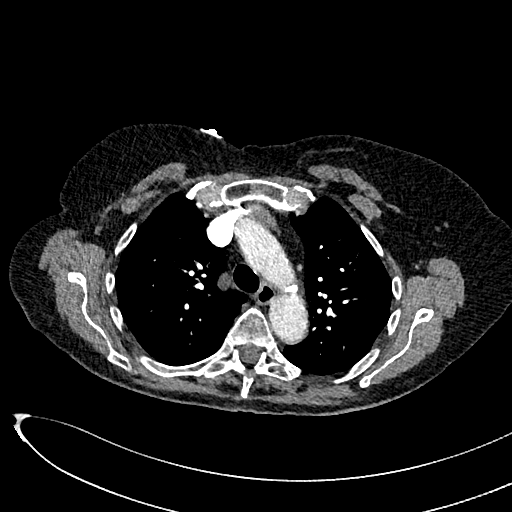
[im 255/365  lung]
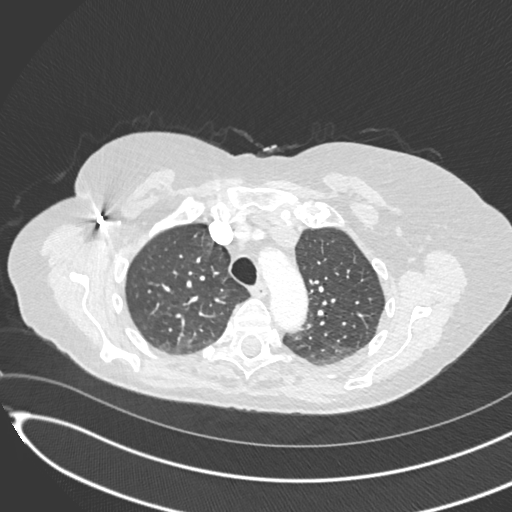
[im 274/365  mediastinal]
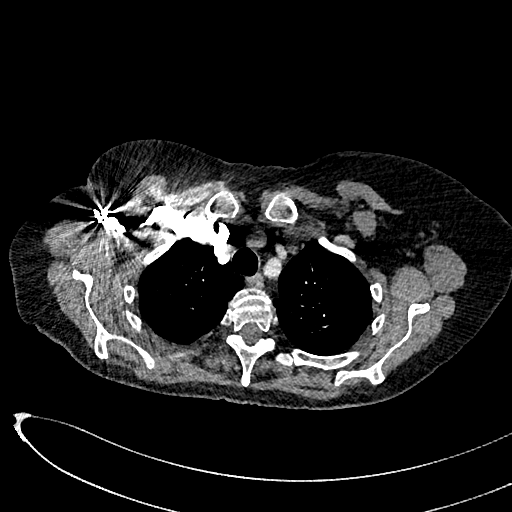
[im 292/365  lung]
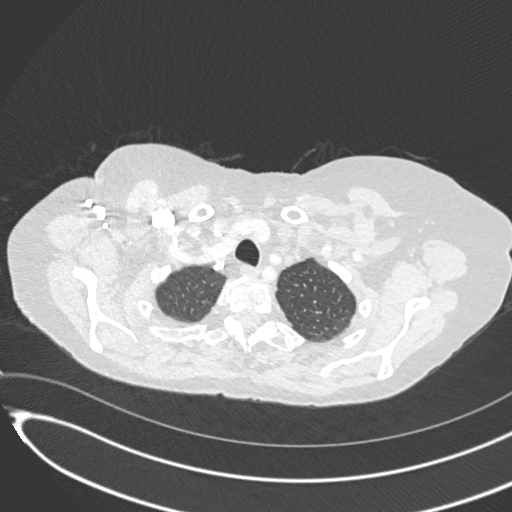
[im 310/365  mediastinal]
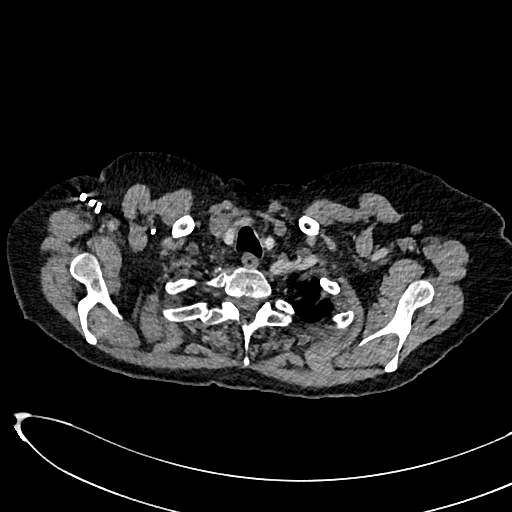
[im 328/365  lung]
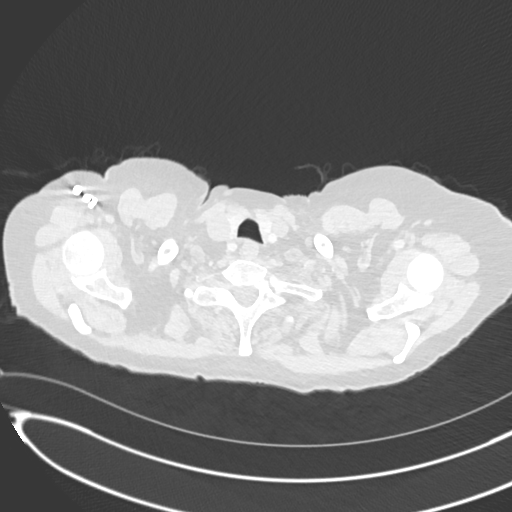
[im 346/365  mediastinal]
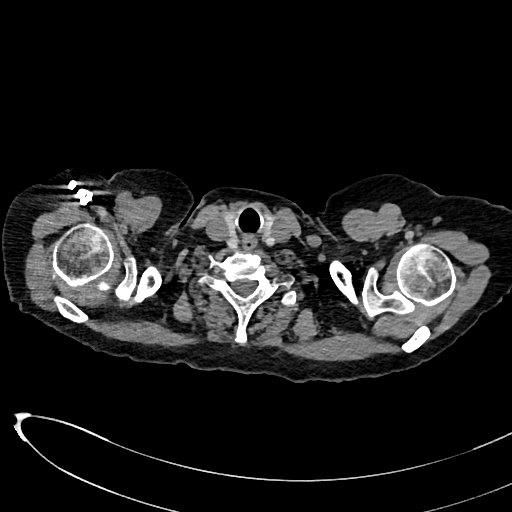

[Series 8: pe 2mm cor · coronal · 0.51mm/px · 1 of 130 slices shown]
[im 65/130  mediastinal]
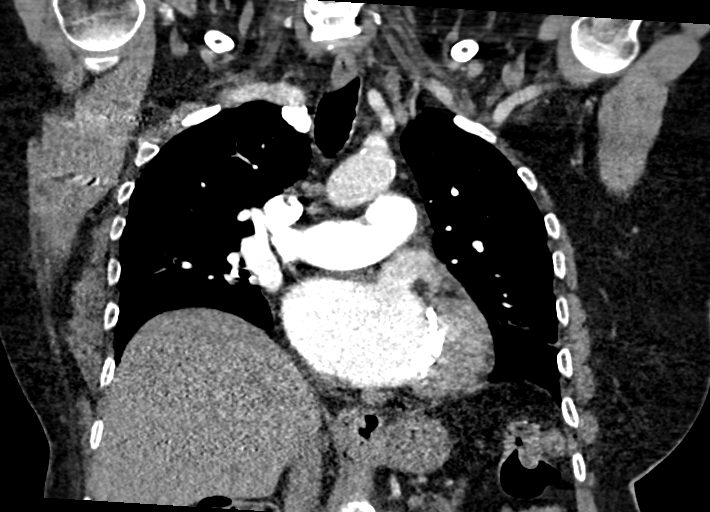

[19 of 36 positions shown; findings below may reference images not displayed]

FINDINGS: Cardiovascular: Satisfactory opacification of the pulmonary arteries
to the segmental level. No evidence of pulmonary embolism. Unchanged
borderline cardiomegaly with mild left atrial enlargement. No
pericardial effusion. No thoracic aortic aneurysm or dissection.
Coronary, aortic arch, and branch vessel atherosclerotic vascular
disease.

Mediastinum/Nodes: No enlarged mediastinal, hilar, or axillary lymph
nodes. Prior right axillary lymph node dissection. Thyroid gland,
trachea, and esophagus demonstrate no significant findings.

Lungs/Pleura: Mild subsegmental atelectasis in both lungs. No focal
consolidation, pleural effusion, or pneumothorax.

Upper Abdomen: No acute abnormality.  Small hiatal hernia.

Musculoskeletal: No chest wall abnormality. No acute or significant
osseous findings.

Review of the MIP images confirms the above findings.
IMPRESSION: 1. No evidence of pulmonary embolism. No acute intrathoracic
process.
2. Aortic Atherosclerosis ([id number]-[id number].S).

## 2022-08-16 ENCOUNTER — Telehealth: Payer: Self-pay | Admitting: Pulmonary Disease

## 2022-08-16 DIAGNOSIS — I10 Essential (primary) hypertension: Secondary | ICD-10-CM | POA: Diagnosis not present

## 2022-08-16 DIAGNOSIS — I2510 Atherosclerotic heart disease of native coronary artery without angina pectoris: Secondary | ICD-10-CM | POA: Diagnosis not present

## 2022-08-16 DIAGNOSIS — E782 Mixed hyperlipidemia: Secondary | ICD-10-CM | POA: Diagnosis not present

## 2022-08-16 DIAGNOSIS — K219 Gastro-esophageal reflux disease without esophagitis: Secondary | ICD-10-CM | POA: Diagnosis not present

## 2022-08-16 NOTE — Telephone Encounter (Signed)
Patient states when she was seen 4/30 Alva instructed her to call her DME company to adjust the air flow in her mask. Patient states DME company switched to something else and they keep telling her they have not received anything. Please advise on what to do.

## 2022-08-22 NOTE — Telephone Encounter (Signed)
Referral in for this   Routing to [facility] to troubleshoot, thanks!

## 2022-08-23 ENCOUNTER — Ambulatory Visit: Payer: PPO | Admitting: Physician Assistant

## 2022-08-23 ENCOUNTER — Telehealth: Payer: Self-pay

## 2022-08-23 VITALS — BP 138/70 | HR 69

## 2022-08-23 DIAGNOSIS — N3941 Urge incontinence: Secondary | ICD-10-CM

## 2022-08-23 DIAGNOSIS — R339 Retention of urine, unspecified: Secondary | ICD-10-CM

## 2022-08-23 LAB — BLADDER SCAN AMB NON-IMAGING: Scan Result: 292

## 2022-08-23 MED ORDER — TROSPIUM CHLORIDE ER 60 MG PO CP24: 1.0000 | ORAL_CAPSULE | Freq: Every day | ORAL | 11 refills | Status: DC

## 2022-08-23 NOTE — Telephone Encounter (Signed)
Message sent to [employer] to take over this order urgently.   Miscommunication has been handled with [facility]

## 2022-08-23 NOTE — Telephone Encounter (Signed)
08/23/22  Wanda Ching, PA-C  Tandra Rosado, Titus Mould, CMA Can you please get her scheduled for PTNS? Thursday or Friday afternoons, please.  Called [employer] no PA is needed and it will be a co pay of $20 each visit. Left message for patient to discuss and schedule

## 2022-08-23 NOTE — Patient Instructions (Addendum)
Please STOP Myrbetriq and oxybutynin. On 6/27, please START trospium. We will call you to schedule your PTNS treatments.

## 2022-08-23 NOTE — Progress Notes (Signed)
08/23/2022 1:41 PM   Wanda Green 05/21/1934 [id number]  CC: Chief Complaint  Patient presents with   Follow-up   HPI: Wanda Green is a 88 y.o. female with PMH urge incontinence and incomplete bladder emptying who presents today for symptom recheck on Myrbetriq 50 mg daily.   Today she reports if anything her urinary symptoms worsened on Myrbetriq.  She noticed increased urinary frequency and leakage on this medication.  She stopped it yesterday and resumed oxybutynin this morning and is immediately noticed an improvement in her symptoms.  She is interested in pursuing PTNS.  She does not have a cardiac pacemaker.  PVR , previously 191 mL.  PMH: Past Medical History:  Diagnosis Date   Cancer (HCC) 2000   right breast stage 2 chemo and radiation   Celiac artery stenosis (HCC)    followed by vascular--- dr Demetrius Charity. levy ([facility] in [place])   Chronic rhinitis    Chronic venous insufficiency    Claustrophobia    "SEVERE"   Claustrophobia    Complication of anesthesia    Depression    Dysrhythmia 2016   GERD (gastroesophageal reflux disease)    GI bleed 09/22/2020   Heart murmur    sees dr Sharyn Lull q 3 months mild murmur per pt   Hiatal hernia    History of adenomatous polyp of colon    History of atrial fibrillation    cardiologist--- dr Sharyn Lull---  first dx 2014,  per pt when at time of recurrent flare-up ischemic colitis in 2016 she was in normal rhythm   History of cancer chemotherapy 2000   for breast cancer   History of cervical dysplasia age 88   s/p  cervical cone bx   History of external beam radiation therapy 2000   right breast cancer   History of ischemic colitis    followed by dr Matthias Hughs  (w/ Deboraha Sprang)  pt hx recurrent ischemic or infectious colitis   History of ischemic colitis    History of lower GI bleeding    History of right breast cancer oncologist--- dr Cyndie Chime, Theron Arista in epic07-19-2016 no recurrence, released prn   dx 01/ 2000,  right breast  cancer, Stage II, positive 2 nodes, ER+/  s/p  right breast lumpectomy w/ sln bx's in 2000;  completed chemo/ radiation in 2000   Hyperlipidemia    Hypertension    IDA (iron deficiency anemia) 2020   Myocardial infarction (HCC) 2016   Neuromuscular disorder (HCC)    neropathy lt leg   OA (osteoarthritis)    knees   OSA on CPAP cpap set on 10   followed by dr Vassie Loll (pulmonology) study in epic 09-01-2012  milds osa   Osteoporosis 06/2016   T score -3.0   Peripheral vascular disease (HCC)    vericose veins   PONV (postoperative nausea and vomiting)    Pre-diabetes    Pt denies   Right wrist fracture 04/22/2020    Surgical History: Past Surgical History:  Procedure Laterality Date   BREAST BIOPSY Right 2000   BREAST LUMPECTOMY WITH AXILLARY LYMPH NODE BIOPSY Right 2000   CARDIAC CATHETERIZATION N/A 11/25/2014   Procedure: Left Heart Cath and Coronary Angiography;  Surgeon: Orpah Cobb, MD;  [facility]: [facility];  Service: Cardiovascular;  Laterality: N/A;   CATARACT EXTRACTION W/ INTRAOCULAR LENS  IMPLANT, BILATERAL Bilateral 2018   CERVICAL CONE BIOPSY  age 37   COLONOSCOPY WITH PROPOFOL N/A 09/01/2014   Procedure: COLONOSCOPY WITH PROPOFOL;  Surgeon: Bernette Redbird, MD;  Location: [facility] ENDOSCOPY;  Service: Endoscopy;  Laterality: N/A;  this will be done unprepped   KNEE ARTHROSCOPY Bilateral right 02/ 2003;  left 07/ 2005  @[facility]    NASAL SINUS SURGERY  02/2013   including deviated septum repair   OPEN REDUCTION INTERNAL FIXATION (ORIF) DISTAL RADIAL FRACTURE Right 04/26/2020   Procedure: OPEN REDUCTION INTERNAL FIXATION (ORIF) RIGHT  DISTAL RADIAL FRACTURE;  Surgeon: Sheral Apley, MD;  Location: [facility];  Service: Orthopedics;  Laterality: Right;   ORIF WRIST FRACTURE Left 2001  @[facility]    TOTAL KNEE ARTHROPLASTY Left 04/09/2017   Procedure: LEFT TOTAL KNEE ARTHROPLASTY;  Surgeon: Sheral Apley, MD;  Location: [facility] OR;  Service: Orthopedics;  Laterality:  Left;   TOTAL KNEE ARTHROPLASTY Right 09/20/2020   Procedure: TOTAL KNEE ARTHROPLASTY;  Surgeon: Sheral Apley, MD;  Location: [facility];  Service: Orthopedics;  Laterality: Right;   TUBAL LIGATION Bilateral 1975   VARICOSE VEIN SURGERY  05/2019   WRIST FRACTURE SURGERY Left ~ 2006    Home Medications:  Allergies as of 08/23/2022       Reactions   Ultram [tramadol] Nausea And Vomiting   Percocet [oxycodone-acetaminophen] Nausea And Vomiting   Codeine Nausea And Vomiting   Hydrocodone Nausea And Vomiting   Vibra-tab [doxycycline] Nausea And Vomiting        Medication List        Accurate as of August 23, 2022  1:41 PM. If you have any questions, ask your nurse or doctor.          acetaminophen 500 MG tablet Commonly known as: TYLENOL Take 1,000 mg by mouth daily as needed for headache, fever, moderate pain or mild pain.   amLODipine 2.5 MG tablet Commonly known as: NORVASC Take 2.5 mg by mouth at bedtime.   atorvastatin 20 MG tablet Commonly known as: LIPITOR Take 20 mg by mouth at bedtime.   losartan 100 MG tablet Commonly known as: COZAAR Take 100 mg by mouth in the morning.   metoprolol succinate 25 MG 24 hr tablet Commonly known as: TOPROL-XL Take 25 mg by mouth in the morning. Take 25mg  in the morning and 50mg  at night.   metoprolol succinate 50 MG 24 hr tablet Commonly known as: TOPROL-XL Take 50 mg by mouth at bedtime. Take with or immediately following a meal.   mirabegron ER 50 MG Tb24 tablet Commonly known as: MYRBETRIQ Take 1 tablet (50 mg total) by mouth daily.   multivitamin tablet Take 1 tablet by mouth in the morning.   omeprazole 20 MG capsule Commonly known as: PRILOSEC Take 1 capsule (20 mg total) by mouth 2 (two) times daily. What changed:  when to take this reasons to take this   PROBIOTIC DAILY PO Take 1 tablet by mouth in the morning.        Allergies:  Allergies  Allergen Reactions   Ultram [Tramadol] Nausea And  Vomiting   Percocet [Oxycodone-Acetaminophen] Nausea And Vomiting   Codeine Nausea And Vomiting   Hydrocodone Nausea And Vomiting   Vibra-Tab [Doxycycline] Nausea And Vomiting    Family History: Family History  Problem Relation Age of Onset   Hypertension Mother    Heart failure Mother    Heart disease Mother    Hypertension Father    Heart disease Father    Hypertension Sister    Uterine cancer Sister    Hypertension Brother    Heart disease Brother     Social  History:   reports that she quit smoking about 54 years ago. Her smoking use included cigarettes. She has a 0.30 pack-year smoking history. She has never used smokeless tobacco. She reports that she does not drink alcohol and does not use drugs.  Physical Exam: BP 138/70   Pulse 69   LMP 07/22/2015   Constitutional:  Alert and oriented, no acute distress, nontoxic appearing HEENT: [place], AT Cardiovascular: No clubbing, cyanosis, or edema Respiratory: Normal respiratory effort, no increased work of breathing Skin: No rashes, bruises or suspicious lesions Neurologic: Grossly intact, no focal deficits, moving all 4 extremities Psychiatric: Normal mood and affect  Laboratory Data: Results for orders placed or performed in visit on 08/23/22  BLADDER SCAN AMB NON-IMAGING  Result Value Ref Range   Scan Result 292 ml    Assessment & Plan:   1. Urge incontinence of urine Symptomatic worsening after switching from oxybutynin to Myrbetriq, also increased PVR in this patient with incomplete bladder emptying at baseline.  Agree with PTNS.  Will stop Myrbetriq.  I do not recommend resuming oxybutynin due to risk for cognitive decline/increased risk for dementia.  I asked her to stay off urinary agents for about 1 week for a drug holiday and then she can start trospium.  She is in agreement with this plan. - BLADDER SCAN AMB NON-IMAGING - Trospium Chloride 60 MG CP24; Take 1 capsule (60 mg total) by mouth daily.  Dispense: 30  capsule; Refill: 11   Return for PTNS + PVR.  Carman Ching, PA-C  [facility] [place] [address], [address] [place], [place] [address] [phone]

## 2022-08-24 ENCOUNTER — Ambulatory Visit: Payer: PPO | Admitting: Podiatry

## 2022-08-27 NOTE — Telephone Encounter (Signed)
Patient advised. Would like to proceed. PTNS scheduled for 09/13/22 for # 1 session

## 2022-09-10 ENCOUNTER — Telehealth: Payer: Self-pay

## 2022-09-10 DIAGNOSIS — A000 Cholera due to Vibrio cholerae 01, biovar cholerae: Secondary | ICD-10-CM | POA: Diagnosis not present

## 2022-09-10 NOTE — Patient Outreach (Signed)
  Care Coordination   09/10/2022 Name: Wanda Green MRN: [id number] DOB: 09-30-1934   Care Coordination Outreach Attempts:  An unsuccessful telephone outreach was attempted today to offer the patient information about available care coordination services. HIPAA compliant message left  Follow Up Plan:  Additional outreach attempts will be made to offer the patient care coordination information and services.   Encounter Outcome:  No Answer   Care Coordination Interventions:  No, not indicated    [facility] Care Coordination [phone] direct line

## 2022-09-13 ENCOUNTER — Ambulatory Visit: Payer: PPO | Admitting: Physician Assistant

## 2022-09-13 DIAGNOSIS — N3941 Urge incontinence: Secondary | ICD-10-CM | POA: Diagnosis not present

## 2022-09-13 NOTE — Progress Notes (Signed)
PTNS  Session # 1  Health & Social Factors: new hypertension Caffeine: 2 Alcohol: 0 Daytime voids #per day: 10 Night-time voids #per night: 2 Urgency: severe Incontinence Episodes #per day: 6 Ankle used: right Treatment Setting: 5 Feeling/ Response: both Comments: Patient tolerated well. Consent signed.  Performed By: [place] Pink, RMA  Follow Up: 1 week for PTNS #2

## 2022-09-13 NOTE — Patient Instructions (Signed)
Tracking Your Bladder Symptoms   Patient Name:___________________________________________________  Example: Day   Daytime Voids  Nighttime Voids Urgency for the Day (none, mild, strong, severe) Number of Accidents/ Leaks Beverage Comments  Monday IIII II Strong I Water IIII Coffee  I     Week Starting:____________________________________  Day Daytime  Voids Nighttime  Voids Urgency for the Day (none, mild, strong, severe) Number of Accidents/ Leaks Beverages Comments                                                           This week my symptoms were:  O much better O better O the same O worse   

## 2022-09-14 ENCOUNTER — Encounter: Payer: Self-pay | Admitting: Podiatry

## 2022-09-14 ENCOUNTER — Ambulatory Visit: Payer: PPO | Admitting: Podiatry

## 2022-09-14 VITALS — BP 167/69 | HR 68

## 2022-09-14 DIAGNOSIS — M2011 Hallux valgus (acquired), right foot: Secondary | ICD-10-CM | POA: Diagnosis not present

## 2022-09-14 DIAGNOSIS — D2371 Other benign neoplasm of skin of right lower limb, including hip: Secondary | ICD-10-CM

## 2022-09-14 NOTE — Progress Notes (Signed)
Chief Complaint  Patient presents with   Foot Pain    "My foot feels better."    Subjective: 88 y.o. female presenting to the office today for follow-up evaluation of a symptomatic skin lesion noted to the plantar aspect of the right forefoot underlying the first MTP.  Patient states that she is doing much better.  She no longer has any pain or tenderness associated to her foot.  She is able to ambulate just fine without any pain.  Overall she is very satisfied and alleviated that the pain is coming from the symptomatic skin lesion which was debrided last visit and not her bunion.  Past Medical History:  Diagnosis Date   Cancer [facility]) 2000   right breast stage 2 chemo and radiation   Celiac artery stenosis (HCC)    followed by vascular--- dr Demetrius Charity. levy ([facility] in [place])   Chronic rhinitis    Chronic venous insufficiency    Claustrophobia    "SEVERE"   Claustrophobia    Complication of anesthesia    Depression    Dysrhythmia 2016   GERD (gastroesophageal reflux disease)    GI bleed 09/22/2020   Heart murmur    sees dr Sharyn Lull q 3 months mild murmur per pt   Hiatal hernia    History of adenomatous polyp of colon    History of atrial fibrillation    cardiologist--- dr Sharyn Lull---  first dx 2014,  per pt when at time of recurrent flare-up ischemic colitis in 2016 she was in normal rhythm   History of cancer chemotherapy 2000   for breast cancer   History of cervical dysplasia age 64   s/p  cervical cone bx   History of external beam radiation therapy 2000   right breast cancer   History of ischemic colitis    followed by dr Matthias Hughs  (w/ Deboraha Sprang)  pt hx recurrent ischemic or infectious colitis   History of ischemic colitis    History of lower GI bleeding    History of right breast cancer oncologist--- dr Cyndie Chime, Theron Arista in epic07-19-2016 no recurrence, released prn   dx 01/ 2000,  right breast cancer, Stage II, positive 2 nodes, ER+/  s/p  right breast lumpectomy w/ sln bx's in  2000;  completed chemo/ radiation in 2000   Hyperlipidemia    Hypertension    IDA (iron deficiency anemia) 2020   Myocardial infarction (HCC) 2016   Neuromuscular disorder (HCC)    neropathy lt leg   OA (osteoarthritis)    knees   OSA on CPAP cpap set on 10   followed by dr Vassie Loll (pulmonology) study in epic 09-01-2012  milds osa   Osteoporosis 06/2016   T score -3.0   Peripheral vascular disease (HCC)    vericose veins   PONV (postoperative nausea and vomiting)    Pre-diabetes    Pt denies   Right wrist fracture 04/22/2020    Past Surgical History:  Procedure Laterality Date   BREAST BIOPSY Right 2000   BREAST LUMPECTOMY WITH AXILLARY LYMPH NODE BIOPSY Right 2000   CARDIAC CATHETERIZATION N/A 11/25/2014   Procedure: Left Heart Cath and Coronary Angiography;  Surgeon: Orpah Cobb, MD;  Location: [facility]VASIVE [facility] LAB;  Service: Cardiovascular;  Laterality: N/A;   CATARACT EXTRACTION W/ INTRAOCULAR LENS  IMPLANT, BILATERAL Bilateral 2018   CERVICAL CONE BIOPSY  age 43   COLONOSCOPY WITH PROPOFOL N/A 09/01/2014   Procedure: COLONOSCOPY WITH PROPOFOL;  Surgeon: Bernette Redbird, MD;  Location: [facility] ENDOSCOPY;  Service: Endoscopy;  Laterality: N/A;  this will be done unprepped   KNEE ARTHROSCOPY Bilateral right 02/ 2003;  left 07/ 2005  @[facility]    NASAL SINUS SURGERY  02/2013   including deviated septum repair   OPEN REDUCTION INTERNAL FIXATION (ORIF) DISTAL RADIAL FRACTURE Right 04/26/2020   Procedure: OPEN REDUCTION INTERNAL FIXATION (ORIF) RIGHT  DISTAL RADIAL FRACTURE;  Surgeon: Sheral Apley, MD;  Location: [facility];  Service: Orthopedics;  Laterality: Right;   ORIF WRIST FRACTURE Left 2001  @[facility]    TOTAL KNEE ARTHROPLASTY Left 04/09/2017   Procedure: LEFT TOTAL KNEE ARTHROPLASTY;  Surgeon: Sheral Apley, MD;  Location: [facility] OR;  Service: Orthopedics;  Laterality: Left;   TOTAL KNEE ARTHROPLASTY Right 09/20/2020   Procedure: TOTAL KNEE ARTHROPLASTY;  Surgeon: Sheral Apley, MD;  Location: [facility];  Service: Orthopedics;  Laterality: Right;   TUBAL LIGATION Bilateral 1975   VARICOSE VEIN SURGERY  05/2019   WRIST FRACTURE SURGERY Left ~ 2006    Allergies  Allergen Reactions   Ultram [Tramadol] Nausea And Vomiting   Percocet [Oxycodone-Acetaminophen] Nausea And Vomiting   Codeine Nausea And Vomiting   Hydrocodone Nausea And Vomiting   Vibra-Tab [Doxycycline] Nausea And Vomiting     Objective:  Physical Exam General: Alert and oriented x3 in no acute distress  Dermatology: The hyperkeratotic lesion to the plantar aspect of the first MTP of the right foot appears to be resolved.  Soft supple skin.  Normal skin integument.  No open wounds or lesions  Vascular: Palpable pedal pulses bilaterally. No edema or erythema noted. Capillary refill within normal limits.  Neurological: Grossly intact via light touch  Musculoskeletal Exam: No pain.  No pain with ambulation.  Hallux valgus deformity noted with limited range of motion of the first MTP right  Assessment: 1.  Symptomatic benign skin lesion plantar aspect of the first MTP right   Plan of Care:  -Patient evaluated.  Overall she is doing significantly better.  She no longer has any pain or tenderness associated to the foot. -Recommend good supportive shoes and sneakers -Return to clinic as needed  Felecia Shelling, DPM [facility]  Dr. Felecia Shelling, DPM    [address] [place], [place] [address]                Office [phone]  Fax [phone]

## 2022-09-19 ENCOUNTER — Other Ambulatory Visit: Payer: Self-pay | Admitting: Family

## 2022-09-19 DIAGNOSIS — N3281 Overactive bladder: Secondary | ICD-10-CM

## 2022-09-20 ENCOUNTER — Ambulatory Visit: Payer: PPO | Admitting: Physician Assistant

## 2022-09-20 DIAGNOSIS — N3941 Urge incontinence: Secondary | ICD-10-CM

## 2022-09-20 NOTE — Patient Instructions (Signed)

## 2022-09-20 NOTE — Progress Notes (Signed)
PTNS   Session # 2   Health & Social Factors: none Caffeine: 2 Alcohol: 0 Daytime voids #per day: 10 Night-time voids #per night: 2 Urgency: severe Incontinence Episodes #per day: 6 Ankle used: right Treatment Setting: 5 Feeling/ Response: both Comments: Patient tolerated well.   Performed By: Ples Specter CMA

## 2022-09-25 ENCOUNTER — Telehealth: Payer: Self-pay

## 2022-09-25 NOTE — Patient Outreach (Signed)
  Care Coordination   09/25/2022 Name: Wanda Green MRN: [id number] DOB: 1934-09-21   Care Coordination Outreach Attempts:  An unsuccessful telephone outreach was attempted today to offer the patient information about available care coordination services. HIPAA compliant message left.  Follow Up Plan:  Additional outreach attempts will be made to offer the patient care coordination information and services.   Encounter Outcome:  No Answer   Care Coordination Interventions:  No, not indicated    [facility] Care Coordination [phone] direct line

## 2022-09-27 ENCOUNTER — Ambulatory Visit: Payer: PPO | Admitting: Physician Assistant

## 2022-09-27 DIAGNOSIS — N3941 Urge incontinence: Secondary | ICD-10-CM | POA: Diagnosis not present

## 2022-09-27 NOTE — Progress Notes (Signed)
PTNS  Session # 3  Health & Social Factors: no change Caffeine: 2 Alcohol: 0 Daytime voids #per day: 10 Night-time voids #per night: 1-2 Urgency: severe Incontinence Episodes #per day: 5 Ankle used: right Treatment Setting: 5 Feeling/ Response: both Comments: Patient tolerated well.  Performed By: Carman Ching, PA-C   Follow Up: 1 week for PTNS #4

## 2022-10-04 ENCOUNTER — Ambulatory Visit: Payer: PPO | Admitting: Physician Assistant

## 2022-10-04 DIAGNOSIS — N3941 Urge incontinence: Secondary | ICD-10-CM

## 2022-10-04 MED ORDER — GEMTESA 75 MG PO TABS
75.0000 mg | ORAL_TABLET | Freq: Every day | ORAL | Status: DC
Start: 2022-10-04 — End: 2022-11-01

## 2022-10-04 NOTE — Progress Notes (Addendum)
PTNS  Session # 4  Health & Social Factors: no change Caffeine: 2 Alcohol: 0 Daytime voids #per day: 12 Night-time voids #per night: 1 Urgency: severe Incontinence Episodes #per day: 6 Ankle used: left Treatment Setting: 4 Feeling/ Response: sensory Comments: pt tolerated well  Performed By: Randa Lynn, RMA  Additional notes: Patient did not fill trospium ER due to cost. I gave her 6 weeks of Gemtesa samples today as an alternative.  Follow Up: 1 week for PTNS #5

## 2022-10-04 NOTE — Addendum Note (Signed)
Addended by: Debarah Crape on: 10/04/2022 02:47 PM   Modules accepted: Orders

## 2022-10-11 ENCOUNTER — Ambulatory Visit: Payer: PPO | Admitting: Physician Assistant

## 2022-10-11 VITALS — BP 143/70 | Ht 61.5 in | Wt 152.0 lb

## 2022-10-11 DIAGNOSIS — N3941 Urge incontinence: Secondary | ICD-10-CM

## 2022-10-11 NOTE — Progress Notes (Signed)
PTNS  Session # 5  Health & Social Factors: Wanda Green last week, already noticing improved frequency Caffeine: 2 Alcohol: 0 Daytime voids #per day: 8-10 Night-time voids #per night: 2 Urgency: severe Incontinence Episodes #per day: 5-6 Ankle used: left Treatment Setting: 2 Feeling/ Response: sensory Comments: Patient tolerated well  Performed By: Carman Ching, PA-C   Follow Up: 1 week for PTNS #6

## 2022-10-11 NOTE — Patient Instructions (Signed)
Tracking Your Bladder Symptoms   Patient Name:___________________________________________________  Example: Day   Daytime Voids  Nighttime Voids Urgency for the Day (none, mild, strong, severe) Number of Accidents/ Leaks Beverage Comments  Monday IIII II Strong I Water IIII Coffee  I     Week Starting:____________________________________  Day Daytime  Voids Nighttime  Voids Urgency for the Day (none, mild, strong, severe) Number of Accidents/ Leaks Beverages Comments                                                           This week my symptoms were:  O much better O better O the same O worse   

## 2022-10-18 ENCOUNTER — Encounter (INDEPENDENT_AMBULATORY_CARE_PROVIDER_SITE_OTHER): Payer: Self-pay

## 2022-10-18 ENCOUNTER — Ambulatory Visit: Payer: PPO | Admitting: Physician Assistant

## 2022-10-18 DIAGNOSIS — N3941 Urge incontinence: Secondary | ICD-10-CM

## 2022-10-18 NOTE — Patient Instructions (Signed)

## 2022-10-18 NOTE — Progress Notes (Signed)
PTNS  Session # 6  Health & Social Factors: no change Caffeine: 2 Alcohol: 0 Daytime voids #per day: 10-15 Night-time voids #per night: 2 Urgency: severe Incontinence Episodes #per day: 10-12 Ankle used: left Treatment Setting: 5 Feeling/ Response: sensory Comments: Patient tolerated well.  Performed By: Carman Ching, PA-C   Follow Up: 1 week for PTNS #7

## 2022-10-25 ENCOUNTER — Ambulatory Visit: Payer: PPO | Admitting: Physician Assistant

## 2022-10-25 DIAGNOSIS — N3941 Urge incontinence: Secondary | ICD-10-CM | POA: Diagnosis not present

## 2022-10-25 NOTE — Patient Instructions (Signed)

## 2022-10-25 NOTE — Progress Notes (Signed)
PTNS  Session # 7  Health & Social Factors: no change Caffeine: 0 Alcohol: 0 Daytime voids #per day: 10 Night-time voids #per night: 1-2 Urgency: severe Incontinence Episodes #per day: 5 Ankle used: right Treatment Setting: 3 Feeling/ Response: sensory Comments: Patient tolerated well.  Performed By: Carman Ching, PA-C   Follow Up: 1 week for PTNS #8

## 2022-10-26 DIAGNOSIS — G4733 Obstructive sleep apnea (adult) (pediatric): Secondary | ICD-10-CM | POA: Diagnosis not present

## 2022-11-01 ENCOUNTER — Ambulatory Visit: Payer: PPO | Admitting: Physician Assistant

## 2022-11-01 ENCOUNTER — Encounter: Payer: Self-pay | Admitting: Physician Assistant

## 2022-11-01 VITALS — BP 131/75 | HR 73

## 2022-11-01 DIAGNOSIS — N3941 Urge incontinence: Secondary | ICD-10-CM

## 2022-11-01 MED ORDER — GEMTESA 75 MG PO TABS
75.0000 mg | ORAL_TABLET | Freq: Every day | ORAL | 11 refills | Status: DC
Start: 2022-11-01 — End: 2022-11-22

## 2022-11-01 NOTE — Patient Instructions (Signed)

## 2022-11-01 NOTE — Progress Notes (Signed)
PTNS  Session # 8  Health & Social Factors: no change Caffeine: 0 Alcohol: 0 Daytime voids #per day: 12 Night-time voids #per night: 1-2 Urgency: severe Incontinence Episodes #per day: 5 Ankle used: right Treatment Setting: 5 Feeling/ Response: sensory Comments: Patient tolerated well. Wants to continue Gemtesa. Will prescribe.  Performed By: Carman Ching, PA-C   Follow Up: 1 week for PTNS #9

## 2022-11-06 DIAGNOSIS — M542 Cervicalgia: Secondary | ICD-10-CM | POA: Diagnosis not present

## 2022-11-08 ENCOUNTER — Ambulatory Visit: Payer: PPO | Admitting: Physician Assistant

## 2022-11-08 DIAGNOSIS — N3941 Urge incontinence: Secondary | ICD-10-CM

## 2022-11-08 NOTE — Progress Notes (Signed)
PTNS  Session # 9  Health & Social Factors: no change Caffeine: 1 Alcohol: 0 Daytime voids #per day: 8 Night-time voids #per night: 1-2 Urgency: severe Incontinence Episodes #per day: 4-5 Ankle used: left Treatment Setting: 7 Feeling/ Response: sensory Comments: Patient tolerated well.  Performed By: Carman Ching, PA-C   Follow Up: 1 week for PTNS #10

## 2022-11-08 NOTE — Patient Instructions (Signed)

## 2022-11-15 ENCOUNTER — Telehealth: Payer: Self-pay

## 2022-11-15 ENCOUNTER — Ambulatory Visit: Payer: PPO | Admitting: Physician Assistant

## 2022-11-15 DIAGNOSIS — N3941 Urge incontinence: Secondary | ICD-10-CM

## 2022-11-15 NOTE — Telephone Encounter (Signed)
PA started for [facility] via health ream advantage, waiting on response

## 2022-11-15 NOTE — Progress Notes (Signed)
PTNS  Session # 10  Health & Social Factors: switched to decaf coffee 2 weeks ago Caffeine: 0 Alcohol: 0 Daytime voids #per day: 7-8 Night-time voids #per night: 1-2 Urgency: severe Incontinence Episodes #per day: 3-4 Ankle used: left Treatment Setting: 6 Feeling/ Response: sensory Comments: Patient tolerated well  Performed By: Carman Ching, PA-C   Follow Up: 1 week fot PTNS #11

## 2022-11-15 NOTE — Patient Instructions (Signed)

## 2022-11-16 DIAGNOSIS — I10 Essential (primary) hypertension: Secondary | ICD-10-CM | POA: Diagnosis not present

## 2022-11-16 DIAGNOSIS — E782 Mixed hyperlipidemia: Secondary | ICD-10-CM | POA: Diagnosis not present

## 2022-11-16 DIAGNOSIS — I2510 Atherosclerotic heart disease of native coronary artery without angina pectoris: Secondary | ICD-10-CM | POA: Diagnosis not present

## 2022-11-16 DIAGNOSIS — D649 Anemia, unspecified: Secondary | ICD-10-CM | POA: Diagnosis not present

## 2022-11-22 ENCOUNTER — Ambulatory Visit (INDEPENDENT_AMBULATORY_CARE_PROVIDER_SITE_OTHER): Payer: PPO | Admitting: Family

## 2022-11-22 ENCOUNTER — Encounter: Payer: Self-pay | Admitting: Family

## 2022-11-22 ENCOUNTER — Ambulatory Visit: Payer: PPO | Admitting: Physician Assistant

## 2022-11-22 VITALS — BP 148/86 | HR 60 | Temp 98.5°F | Ht 61.5 in | Wt 153.4 lb

## 2022-11-22 DIAGNOSIS — I10 Essential (primary) hypertension: Secondary | ICD-10-CM

## 2022-11-22 DIAGNOSIS — Z23 Encounter for immunization: Secondary | ICD-10-CM | POA: Diagnosis not present

## 2022-11-22 DIAGNOSIS — Z79899 Other long term (current) drug therapy: Secondary | ICD-10-CM

## 2022-11-22 DIAGNOSIS — Z0000 Encounter for general adult medical examination without abnormal findings: Secondary | ICD-10-CM

## 2022-11-22 DIAGNOSIS — G4733 Obstructive sleep apnea (adult) (pediatric): Secondary | ICD-10-CM

## 2022-11-22 DIAGNOSIS — N1831 Chronic kidney disease, stage 3a: Secondary | ICD-10-CM

## 2022-11-22 DIAGNOSIS — N3941 Urge incontinence: Secondary | ICD-10-CM | POA: Diagnosis not present

## 2022-11-22 DIAGNOSIS — R7303 Prediabetes: Secondary | ICD-10-CM

## 2022-11-22 DIAGNOSIS — R0982 Postnasal drip: Secondary | ICD-10-CM

## 2022-11-22 DIAGNOSIS — Z1231 Encounter for screening mammogram for malignant neoplasm of breast: Secondary | ICD-10-CM | POA: Insufficient documentation

## 2022-11-22 DIAGNOSIS — I4891 Unspecified atrial fibrillation: Secondary | ICD-10-CM

## 2022-11-22 DIAGNOSIS — Z6828 Body mass index (BMI) 28.0-28.9, adult: Secondary | ICD-10-CM | POA: Diagnosis not present

## 2022-11-22 DIAGNOSIS — E782 Mixed hyperlipidemia: Secondary | ICD-10-CM

## 2022-11-22 DIAGNOSIS — N3281 Overactive bladder: Secondary | ICD-10-CM

## 2022-11-22 DIAGNOSIS — M8580 Other specified disorders of bone density and structure, unspecified site: Secondary | ICD-10-CM | POA: Insufficient documentation

## 2022-11-22 LAB — CBC WITH DIFFERENTIAL/PLATELET
Basophils Absolute: 0 10*3/uL (ref 0.0–0.1)
Basophils Relative: 0.1 % (ref 0.0–3.0)
Eosinophils Absolute: 0 10*3/uL (ref 0.0–0.7)
Eosinophils Relative: 0 % (ref 0.0–5.0)
HCT: 32.2 % — ABNORMAL LOW (ref 36.0–46.0)
Hemoglobin: 10.5 g/dL — ABNORMAL LOW (ref 12.0–15.0)
Lymphocytes Relative: 9.2 % — ABNORMAL LOW (ref 12.0–46.0)
Lymphs Abs: 1 10*3/uL (ref 0.7–4.0)
MCHC: 32.7 g/dL (ref 30.0–36.0)
MCV: 95.7 fl (ref 78.0–100.0)
Monocytes Absolute: 0.9 10*3/uL (ref 0.1–1.0)
Monocytes Relative: 8.9 % (ref 3.0–12.0)
Neutro Abs: 8.6 10*3/uL — ABNORMAL HIGH (ref 1.4–7.7)
Neutrophils Relative %: 81.8 % — ABNORMAL HIGH (ref 43.0–77.0)
Platelets: 256 10*3/uL (ref 150.0–400.0)
RBC: 3.37 Mil/uL — ABNORMAL LOW (ref 3.87–5.11)
RDW: 13.6 % (ref 11.5–15.5)
WBC: 10.5 10*3/uL (ref 4.0–10.5)

## 2022-11-22 LAB — COMPREHENSIVE METABOLIC PANEL
ALT: 35 U/L (ref 0–35)
AST: 20 U/L (ref 0–37)
Albumin: 4.5 g/dL (ref 3.5–5.2)
Alkaline Phosphatase: 52 U/L (ref 39–117)
BUN: 27 mg/dL — ABNORMAL HIGH (ref 6–23)
CO2: 28 mEq/L (ref 19–32)
Calcium: 10.1 mg/dL (ref 8.4–10.5)
Chloride: 103 mEq/L (ref 96–112)
Creatinine, Ser: 1.03 mg/dL (ref 0.40–1.20)
GFR: 49.46 mL/min — ABNORMAL LOW (ref >60.00)
Glucose, Bld: 108 mg/dL — ABNORMAL HIGH (ref 70–99)
Potassium: 4.3 mEq/L (ref 3.5–5.1)
Sodium: 140 mEq/L (ref 135–145)
Total Bilirubin: 0.8 mg/dL (ref 0.2–1.2)
Total Protein: 7.9 g/dL (ref 6.0–8.3)

## 2022-11-22 LAB — LIPID PANEL
Cholesterol: 136 mg/dL (ref 0–200)
HDL: 78.7 mg/dL (ref >39.00)
LDL Cholesterol: 41 mg/dL (ref 0–99)
NonHDL: 56.82
Total CHOL/HDL Ratio: 2
Triglycerides: 81 mg/dL (ref 0.0–149.0)
VLDL: 16.2 mg/dL (ref 0.0–40.0)

## 2022-11-22 LAB — IBC + FERRITIN
Ferritin: 31.2 ng/mL (ref 10.0–291.0)
Iron: 75 ug/dL (ref 42–145)
Saturation Ratios: 21.9 % (ref 20.0–50.0)
TIBC: 343 ug/dL (ref 250.0–450.0)
Transferrin: 245 mg/dL (ref 212.0–360.0)

## 2022-11-22 LAB — HEMOGLOBIN A1C: Hgb A1c MFr Bld: 5.8 % (ref 4.6–6.5)

## 2022-11-22 MED ORDER — MIRABEGRON ER 50 MG PO TB24
50.0000 mg | ORAL_TABLET | Freq: Every day | ORAL | 11 refills | Status: DC
Start: 2022-11-22 — End: 2023-03-15

## 2022-11-22 NOTE — Assessment & Plan Note (Signed)
Suspect cpap and post nasal drip contributing to dry cough  Saline spray and Flonase recommended for nares If no improvement consider sinusitis however pt on prednisone and I think this may be helpful.  If ongoing sinus symptoms will consider antbx

## 2022-11-22 NOTE — Assessment & Plan Note (Signed)
Continue metoprolol as well as baby asa and f/u with cardiologist as scheduled.

## 2022-11-22 NOTE — Patient Instructions (Signed)
  Recommend starting of flonase nose spray for post nasal drip to see if this helps.

## 2022-11-22 NOTE — Assessment & Plan Note (Signed)
Continue gemtesa with urology and f/u as scheduled.

## 2022-11-22 NOTE — Assessment & Plan Note (Signed)
Pt advised of the following: Work on a diabetic diet, try to incorporate exercise at least 20-30 a day for 3 days a week or more.

## 2022-11-22 NOTE — Progress Notes (Signed)
Subjective:  Patient ID: Wanda Green, female    DOB: [birth year]  Age: 88 y.o. MRN: [id number]  Patient Care Team: Mort Sawyers, FNP as PCP - General (Family Medicine) Levert Feinstein, MD as Consulting Physician (Oncology) Bernette Redbird, MD as Consulting Physician (Gastroenterology) Rinaldo Cloud, MD as Consulting Physician (Cardiology)   CC:  Chief Complaint  Patient presents with   Annual Exam    HPI Wanda Green is a 88 y.o. female who presents today for an annual physical exam. She reports consuming a general diet. Exercise is limited by orthopedic condition(s): does try to walk often. She generally feels well. She reports sleeping well. She does not have additional problems to discuss today.   Vision:Within last year Dental:Receives regular dental care   Mammogram: 06/08/22 Bone density scan: 2018. Showed osteopenia. Overdue.   Pt is without acute concerns.  Saw Dr. Logan Bores, podiatry and had a corn removed, pain much improved.   Afib, sees cardiologist every three months. On metoprolol XL 50 mg once daily as well as asa 81 mg.   HTN: on amlodipine 2.5 mg once nightly and also metoprolol 75 mg once daily at bedtime. Also on losartan 100 mg once daily. Doing well. Today blood pressure 164/84. Currently on prednisone which she states increases her blood pressure (for a neck flare up)  HLD: lipitor 20 mg once daily Lab Results  Component Value Date   CHOL 110 11/14/2021   HDL 51.60 11/14/2021   LDLCALC 44 11/14/2021   TRIG 72.0 11/14/2021   CHOLHDL 2 11/14/2021   Urinary incontinence, seeing Dr. Dionisio David, gemtesa helps her slightly. She is getting percutaneous tibial nerve stiulati  Sinusitis, pt this is suspected. She is on prednisone which she thinks is helping slightly. She has a lot of nasal congestion and post nasal drip. She states at times will feel something as a hot flash, she will have this start in the upper chest and radiate up the throat. She  states started about 2-3 weeks ago. Not constant. She states this is worse with pnd and coughing spells.   Advanced Directives Patient does not have advanced directives   DEPRESSION SCREENING    11/22/2022   11:01 AM 05/04/2022   10:11 AM 04/19/2022   11:04 AM 07/21/2021   11:55 AM 04/28/2018   11:01 AM 08/02/2017   11:30 AM 06/10/2017    5:50 PM  PHQ 2/9 Scores  PHQ - 2 Score 0 0 0 0 0 0 0  PHQ- 9 Score 0 0 0         ROS: Negative unless specifically indicated above in HPI.    Current Outpatient Medications:    aspirin EC 81 MG tablet, Take 81 mg by mouth daily. Swallow whole., Disp: , Rfl:    acetaminophen (TYLENOL) 500 MG tablet, Take 1,000 mg by mouth daily as needed for headache, fever, moderate pain or mild pain., Disp: , Rfl:    amLODipine (NORVASC) 2.5 MG tablet, Take 2.5 mg by mouth at bedtime., Disp: , Rfl: 3   atorvastatin (LIPITOR) 20 MG tablet, Take 20 mg by mouth at bedtime., Disp: , Rfl:    losartan (COZAAR) 100 MG tablet, Take 100 mg by mouth in the morning., Disp: , Rfl:    metoprolol succinate (TOPROL-XL) 25 MG 24 hr tablet, Take 25 mg by mouth in the morning. Take 25mg  in the morning and 50mg  at night., Disp: , Rfl:    metoprolol succinate (TOPROL-XL) 50 MG 24 hr tablet, Take  50 mg by mouth at bedtime. Take with or immediately following a meal., Disp: , Rfl:    Multiple Vitamin (MULTIVITAMIN) tablet, Take 1 tablet by mouth in the morning., Disp: , Rfl:    omeprazole (PRILOSEC) 20 MG capsule, Take 1 capsule (20 mg total) by mouth 2 (two) times daily. (Patient taking differently: Take 20 mg by mouth as needed.), Disp: 180 capsule, Rfl: 1   Probiotic Product (PROBIOTIC DAILY PO), Take 1 tablet by mouth in the morning., Disp: , Rfl:    Vibegron (GEMTESA) 75 MG TABS, Take 1 tablet (75 mg total) by mouth daily., Disp: 30 tablet, Rfl: 11    Objective:    BP (!) 148/86 (BP Location: Left Arm, Patient Position: Sitting, Cuff Size: Normal)   Pulse 60   Temp 98.5 F (36.9 C)  (Temporal)   Ht 5' 1.5" (1.562 m)   Wt 153 lb 6.4 oz (69.6 kg)   LMP 07/22/2015   SpO2 98%   BMI 28.52 kg/m   BP Readings from Last 3 Encounters:  11/22/22 (!) 148/86  11/01/22 131/75  10/11/22 (!) 143/70      Physical Exam Constitutional:      General: She is not in acute distress.    Appearance: Normal appearance. She is normal weight. She is not ill-appearing.  HENT:     Head: Normocephalic.     Right Ear: Tympanic membrane normal.     Left Ear: Tympanic membrane normal.     Nose: Nose normal.     Comments: Erythema bil nares with dryness     Mouth/Throat:     Mouth: Mucous membranes are moist.  Eyes:     Extraocular Movements: Extraocular movements intact.     Pupils: Pupils are equal, round, and reactive to light.  Cardiovascular:     Rate and Rhythm: Normal rate and regular rhythm.     Heart sounds: Murmur heard.  Pulmonary:     Effort: Pulmonary effort is normal.     Breath sounds: Normal breath sounds.  Abdominal:     Tenderness: There is no abdominal tenderness.  Musculoskeletal:        General: Normal range of motion.     Cervical back: Normal range of motion.     Right lower leg: No edema.     Left lower leg: No edema.  Skin:    General: Skin is warm.     Capillary Refill: Capillary refill takes less than 2 seconds.  Neurological:     General: No focal deficit present.     Mental Status: She is alert.  Psychiatric:        Mood and Affect: Mood normal.        Behavior: Behavior normal.        Thought Content: Thought content normal.        Judgment: Judgment normal.          Assessment & Plan:  Hyperlipemia, mixed -     CBC with Differential/Platelet -     IBC + Ferritin -     Lipid panel  Screening mammogram for breast cancer -     3D Screening Mammogram, Left and Right; Future  Osteopenia, unspecified location -     DG Bone Density; Future  Stage 3a chronic kidney disease (HCC)  Prediabetes Assessment & Plan: Pt advised of the  following: Work on a diabetic diet, try to incorporate exercise at least 20-30 a day for 3 days a week or more.  Orders: -     Comprehensive metabolic panel -     Hemoglobin A1c  OSA (obstructive sleep apnea)  Primary hypertension Assessment & Plan: Stable blood pressure.  Continue with amlodipine, metoprolol    Atrial fibrillation, unspecified type [facility]) Assessment & Plan: Continue metoprolol as well as baby asa and f/u with cardiologist as scheduled.    On statin therapy -     Comprehensive metabolic panel  Post-nasal drip Assessment & Plan: Suspect cpap and post nasal drip contributing to dry cough  Saline spray and Flonase recommended for nares If no improvement consider sinusitis however pt on prednisone and I think this may be helpful.  If ongoing sinus symptoms will consider antbx   Encounter for immunization -     Flu Vaccine Trivalent High Dose (Fluad)  Overactive bladder Assessment & Plan: Continue gemtesa with urology and f/u as scheduled.    BMI 28.0-28.9,adult  Encounter for general adult medical examination without abnormal findings Assessment & Plan: Patient Counseling(The following topics were reviewed):  Preventative care handout given to pt  Health maintenance and immunizations reviewed. Please refer to Health maintenance section. Pt advised on safe sex, wearing seatbelts in car, and proper nutrition labwork ordered today for annual Dental health: Discussed importance of regular tooth brushing, flossing, and dental visits.        Follow-up: No follow-ups on file.   Mort Sawyers, FNP

## 2022-11-22 NOTE — Patient Instructions (Signed)

## 2022-11-22 NOTE — Progress Notes (Signed)
PTNS  Session # 11  Health & Social Factors: On prednisone. Ran out of Gemtesa 6 days ago. Overall symptoms are improving. Caffeine: 0 Alcohol: 0 Daytime voids #per day: 10 Night-time voids #per night: 1-2 Urgency: severe Incontinence Episodes #per day: every time she voids Ankle used: left Treatment Setting: 3 Feeling/ Response: sensory Comments: Patient tolerated well. Leslye Peer will still cost ~$100/month with insurance. We discussed that at this point I think her safest pharmacotherapy options include trospium IR, though we discussed the risk of side effects (never tried); trospium ER (never filled due to cost); mirabegron, which she previously felt worsened her symptoms; and Gemtesa, which may be cost prohibitive. She would like to try mirabegron again; rx sent to her pharmacy.  Performed By: Carman Ching, PA-C   Follow Up: 1 week for PTNS #12

## 2022-11-22 NOTE — Assessment & Plan Note (Signed)
Stable blood pressure.  Continue with amlodipine, metoprolol

## 2022-11-22 NOTE — Assessment & Plan Note (Signed)
Patient Counseling(The following topics were reviewed): ? Preventative care handout given to pt  ?Health maintenance and immunizations reviewed. Please refer to Health maintenance section. ?Pt advised on safe sex, wearing seatbelts in car, and proper nutrition ?labwork ordered today for annual ?Dental health: Discussed importance of regular tooth brushing, flossing, and dental visits. ? ? ?

## 2022-11-23 ENCOUNTER — Other Ambulatory Visit: Payer: Self-pay | Admitting: Family

## 2022-11-23 DIAGNOSIS — B9689 Other specified bacterial agents as the cause of diseases classified elsewhere: Secondary | ICD-10-CM

## 2022-11-23 MED ORDER — LEVOFLOXACIN 250 MG PO TABS
ORAL_TABLET | ORAL | 0 refills | Status: DC
Start: 2022-11-23 — End: 2023-05-13

## 2022-11-23 NOTE — Progress Notes (Signed)
Kidney function stable.  Iron is normal levels.  A1c has improved as far as prediabetic numbers.  Cholesterol at goal level.  Anemia is stable.   I do suspect bacterial infection with sinus judging by the white blood cell breakdown on your lab results. I am sending in a medication called levaquin to your pharmacy. I have decided on this antibiotic because you can not tolerate doxycycline and there appears to be a history of PCN allergy on your chart (or was this incorrect?)

## 2022-11-29 ENCOUNTER — Ambulatory Visit: Payer: PPO | Admitting: Physician Assistant

## 2022-12-04 ENCOUNTER — Other Ambulatory Visit: Payer: Self-pay | Admitting: Family

## 2022-12-07 ENCOUNTER — Ambulatory Visit (INDEPENDENT_AMBULATORY_CARE_PROVIDER_SITE_OTHER): Payer: PPO | Admitting: Physician Assistant

## 2022-12-07 DIAGNOSIS — N3941 Urge incontinence: Secondary | ICD-10-CM | POA: Diagnosis not present

## 2022-12-07 NOTE — Progress Notes (Signed)
PTNS  Session # 12  Health & Social Factors: no change Caffeine: 1 Alcohol: 0 Daytime voids #per day: 6-7 Night-time voids #per night: 1 Urgency: severe Incontinence Episodes #per day: 4-5 Ankle used: left Treatment Setting: 7 Feeling/ Response: sensory Comments: Patient tolerated well.  Performed By: Carman Ching, PA-C   Follow Up: 3 week sx recheck

## 2022-12-27 ENCOUNTER — Ambulatory Visit: Payer: PPO | Admitting: Physician Assistant

## 2023-01-03 DIAGNOSIS — I129 Hypertensive chronic kidney disease with stage 1 through stage 4 chronic kidney disease, or unspecified chronic kidney disease: Secondary | ICD-10-CM | POA: Diagnosis not present

## 2023-01-03 DIAGNOSIS — M810 Age-related osteoporosis without current pathological fracture: Secondary | ICD-10-CM | POA: Diagnosis not present

## 2023-01-03 DIAGNOSIS — N1831 Chronic kidney disease, stage 3a: Secondary | ICD-10-CM | POA: Diagnosis not present

## 2023-01-03 DIAGNOSIS — K559 Vascular disorder of intestine, unspecified: Secondary | ICD-10-CM | POA: Diagnosis not present

## 2023-01-03 DIAGNOSIS — I10 Essential (primary) hypertension: Secondary | ICD-10-CM | POA: Diagnosis not present

## 2023-01-03 DIAGNOSIS — E663 Overweight: Secondary | ICD-10-CM | POA: Diagnosis not present

## 2023-01-03 DIAGNOSIS — G8929 Other chronic pain: Secondary | ICD-10-CM | POA: Diagnosis not present

## 2023-01-03 DIAGNOSIS — I4891 Unspecified atrial fibrillation: Secondary | ICD-10-CM | POA: Diagnosis not present

## 2023-01-03 DIAGNOSIS — E785 Hyperlipidemia, unspecified: Secondary | ICD-10-CM | POA: Diagnosis not present

## 2023-01-03 DIAGNOSIS — G4733 Obstructive sleep apnea (adult) (pediatric): Secondary | ICD-10-CM | POA: Diagnosis not present

## 2023-01-03 DIAGNOSIS — I2510 Atherosclerotic heart disease of native coronary artery without angina pectoris: Secondary | ICD-10-CM | POA: Diagnosis not present

## 2023-01-03 DIAGNOSIS — D6869 Other thrombophilia: Secondary | ICD-10-CM | POA: Diagnosis not present

## 2023-02-12 DIAGNOSIS — G4733 Obstructive sleep apnea (adult) (pediatric): Secondary | ICD-10-CM | POA: Diagnosis not present

## 2023-03-15 ENCOUNTER — Encounter: Payer: Self-pay | Admitting: Physician Assistant

## 2023-03-15 ENCOUNTER — Ambulatory Visit: Payer: PPO | Admitting: Physician Assistant

## 2023-03-15 VITALS — BP 134/74 | HR 71 | Ht 61.5 in | Wt 150.0 lb

## 2023-03-15 DIAGNOSIS — N3941 Urge incontinence: Secondary | ICD-10-CM | POA: Diagnosis not present

## 2023-03-15 MED ORDER — TROSPIUM CHLORIDE ER 60 MG PO CP24
1.0000 | ORAL_CAPSULE | Freq: Every day | ORAL | 11 refills | Status: DC
Start: 2023-03-15 — End: 2023-05-31

## 2023-03-15 NOTE — Progress Notes (Signed)
 03/15/2023 9:59 AM   Wanda Green [birth year] [id number]  CC: Chief Complaint  Patient presents with   Urinary Incontinence   HPI: Wanda Green is a 89 y.o. female with PMH urge incontinence and incomplete bladder emptying on Myrbetriq  50 mg who presents today for follow-up after completing PTNS x 12.   Today she reports she does not think PTNS made any significant improvement in her symptoms.  She stopped Myrbetriq  due to cost and has been back on oxybutynin .  She is concerned about the long-term cognitive risks with oxybutynin , since she has been taking it for many years and has 2 sisters with dementia.  Reported symptoms as follows:  PTNS #1 PTNS #12  Daytime voids 10 6-7  Nocturia 2 1  Urgency severity severe severe  Incontinence episodes per day 6 4-5   PMH: Past Medical History:  Diagnosis Date   Cancer (HCC) 2000   right breast stage 2 chemo and radiation   Celiac artery stenosis (HCC)    followed by vascular--- dr shaunna. levy ([facility] in [place])   Chronic rhinitis    Chronic venous insufficiency    Claustrophobia    SEVERE   Claustrophobia    Complication of anesthesia    Depression    Dysrhythmia 2016   GERD (gastroesophageal reflux disease)    GI bleed 09/22/2020   Heart murmur    sees dr levern q 3 months mild murmur per pt   Hiatal hernia    History of adenomatous polyp of colon    History of atrial fibrillation    cardiologist--- dr levern---  first dx 2014,  per pt when at time of recurrent flare-up ischemic colitis in 2016 she was in normal rhythm   History of cancer chemotherapy 2000   for breast cancer   History of cervical dysplasia age 44   s/p  cervical cone bx   History of external beam radiation therapy 2000   right breast cancer   History of ischemic colitis    followed by dr donnald  (w/ margarete)  pt hx recurrent ischemic or infectious colitis   History of ischemic colitis    History of lower GI bleeding    History of right breast  cancer oncologist--- dr freddie, arnetta in epic07-19-2016 no recurrence, released prn   dx 01/ 2000,  right breast cancer, Stage II, positive 2 nodes, ER+/  s/p  right breast lumpectomy w/ sln bx's in 2000;  completed chemo/ radiation in 2000   Hyperlipidemia    Hypertension    IDA (iron deficiency anemia) 2020   Myocardial infarction (HCC) 2016   Neuromuscular disorder (HCC)    neropathy lt leg   OA (osteoarthritis)    knees   OSA on CPAP cpap set on 10   followed by dr jude (pulmonology) study in epic 09-01-2012  milds osa   Osteoporosis 06/2016   T score -3.0   Peripheral vascular disease (HCC)    vericose veins   PONV (postoperative nausea and vomiting)    Pre-diabetes    Pt denies   Right wrist fracture 04/22/2020    Surgical History: Past Surgical History:  Procedure Laterality Date   BREAST BIOPSY Right 2000   BREAST LUMPECTOMY WITH AXILLARY LYMPH NODE BIOPSY Right 2000   CARDIAC CATHETERIZATION N/A 11/25/2014   Procedure: Left Heart Cath and Coronary Angiography;  Surgeon: Salena Negri, MD;  Location: [facility];  Service: Cardiovascular;  Laterality: N/A;   CATARACT EXTRACTION W/ INTRAOCULAR  LENS  IMPLANT, BILATERAL Bilateral 2018   CERVICAL CONE BIOPSY  age 36   COLONOSCOPY WITH PROPOFOL  N/A 09/01/2014   Procedure: COLONOSCOPY WITH PROPOFOL ;  Surgeon: Lamar Bunk, MD;  [facility] END[facility];  Service: Endoscopy;  Laterality: N/A;  this will be done unprepped   KNEE ARTHROSCOPY Bilateral right 02/ 2003;  left 07/ 2005  @[facility]    NASAL SINUS SURGERY  02/2013   including deviated septum repair   OPEN REDUCTION INTERNAL FIXATION (ORIF) DISTAL RADIAL FRACTURE Right 04/26/2020   Procedure: OPEN REDUCTION INTERNAL FIXATION (ORIF) RIGHT  DISTAL RADIAL FRACTURE;  Surgeon: Beverley Evalene BIRCH, MD;  Location: [facility];  Service: Orthopedics;  Laterality: Right;   ORIF WRIST FRACTURE Left 2001  @[facility]    TOTAL KNEE ARTHROPLASTY Left 04/09/2017   Procedure: LEFT  TOTAL KNEE ARTHROPLASTY;  Surgeon: Beverley Evalene BIRCH, MD;  Location: [facility] OR;  Service: Orthopedics;  Laterality: Left;   TOTAL KNEE ARTHROPLASTY Right 09/20/2020   Procedure: TOTAL KNEE ARTHROPLASTY;  Surgeon: Beverley Evalene BIRCH, MD;  Location: [facility] ORS;  Service: Orthopedics;  Laterality: Right;   TUBAL LIGATION Bilateral 1975   VARICOSE VEIN SURGERY  05/2019   WRIST FRACTURE SURGERY Left ~ 2006    Home Medications:  Allergies as of 03/15/2023       Reactions   Ultram  [tramadol ] Nausea And Vomiting   Percocet [oxycodone -acetaminophen ] Nausea And Vomiting   Codeine Nausea And Vomiting   Hydrocodone  Nausea And Vomiting   Vibra -tab [doxycycline ] Nausea And Vomiting        Medication List        Accurate as of March 15, 2023  9:59 AM. If you have any questions, ask your nurse or doctor.          acetaminophen  500 MG tablet Commonly known as: TYLENOL  Take 1,000 mg by mouth daily as needed for headache, fever, moderate pain or mild pain.   amLODipine  2.5 MG tablet Commonly known as: NORVASC  Take 2.5 mg by mouth at bedtime.   aspirin  EC 81 MG tablet Take 81 mg by mouth daily. Swallow whole.   atorvastatin  20 MG tablet Commonly known as: LIPITOR Take 20 mg by mouth at bedtime.   levofloxacin  250 MG tablet Commonly known as: Levaquin  Take two tablets (total 500 mg) po day one, then take one tablet (250 mg) daily for four days.   losartan  100 MG tablet Commonly known as: COZAAR  Take 100 mg by mouth in the morning.   metoprolol  succinate 25 MG 24 hr tablet Commonly known as: TOPROL -XL Take 25 mg by mouth in the morning. Take 25mg  in the morning and 50mg  at night.   metoprolol  succinate 50 MG 24 hr tablet Commonly known as: TOPROL -XL Take 50 mg by mouth at bedtime. Take with or immediately following a meal.   mirabegron  ER 50 MG Tb24 tablet Commonly known as: MYRBETRIQ  Take 1 tablet (50 mg total) by mouth daily.   multivitamin tablet Take 1 tablet by mouth in the  morning.   omeprazole  20 MG capsule Commonly known as: PRILOSEC Take 1 capsule (20 mg total) by mouth 2 (two) times daily. What changed:  when to take this reasons to take this   PROBIOTIC DAILY PO Take 1 tablet by mouth in the morning.        Allergies:  Allergies  Allergen Reactions   Ultram  [Tramadol ] Nausea And Vomiting   Percocet [Oxycodone -Acetaminophen ] Nausea And Vomiting   Codeine Nausea And Vomiting   Hydrocodone  Nausea And Vomiting  Vibra -Tab [Doxycycline ] Nausea And Vomiting    Family History: Family History  Problem Relation Age of Onset   Hypertension Mother    Heart failure Mother    Heart disease Mother    Hypertension Father    Heart disease Father    Hypertension Sister    Uterine cancer Sister    Hypertension Brother    Heart disease Brother     Social History:   reports that she quit smoking about 55 years ago. Her smoking use included cigarettes. She started smoking about 58 years ago. She has a 0.3 pack-year smoking history. She has never used smokeless tobacco. She reports that she does not drink alcohol and does not use drugs.  Physical Exam: BP 134/74   Pulse 71   Ht 5' 1.5 (1.562 m)   Wt 150 lb (68 kg)   LMP 07/22/2015   BMI 27.88 kg/m   Constitutional:  Alert and oriented, no acute distress, nontoxic appearing HEENT: [place], AT Cardiovascular: No clubbing, cyanosis, or edema Respiratory: Normal respiratory effort, no increased work of breathing Skin: No rashes, bruises or suspicious lesions Neurologic: Grossly intact, no focal deficits, moving all 4 extremities Psychiatric: Normal mood and affect  Assessment & Plan:   1. Urge incontinence of urine (Primary) PTNS unsuccessful: there was some modest improvement in her reported symptoms at the time of treatment completion, however she was making adjustment to her medications at that time and she states that her urinary symptoms have been stable at baseline since discontinuing.  Will  not pursue PTNS maintenance.  I agree with stopping oxybutynin  due to cognitive concerns.  Will try trospium  as an alternative and plan for symptom recheck and PVR in 6 weeks.  If ineffective, recommend second opinion with Dr. MacDiarmid and ultimately consideration of intravesical Botox versus InterStim. - Trospium  Chloride 60 MG CP24; Take 1 capsule (60 mg total) by mouth daily.  Dispense: 30 capsule; Refill: 11   Return in about 6 weeks (around 04/26/2023) for Symptom recheck with PVR.  Lucie Hones, PA-C  [facility] [place] [address] [place], [place] [address] [phone]

## 2023-03-28 ENCOUNTER — Other Ambulatory Visit: Payer: Self-pay | Admitting: Physician Assistant

## 2023-03-28 DIAGNOSIS — N3941 Urge incontinence: Secondary | ICD-10-CM

## 2023-04-26 ENCOUNTER — Ambulatory Visit: Payer: Self-pay | Admitting: Physician Assistant

## 2023-05-13 ENCOUNTER — Encounter: Payer: Self-pay | Admitting: Internal Medicine

## 2023-05-13 ENCOUNTER — Telehealth (INDEPENDENT_AMBULATORY_CARE_PROVIDER_SITE_OTHER): Admitting: Internal Medicine

## 2023-05-13 ENCOUNTER — Telehealth: Payer: Self-pay

## 2023-05-13 DIAGNOSIS — U071 COVID-19: Secondary | ICD-10-CM | POA: Diagnosis not present

## 2023-05-13 MED ORDER — PROMETHAZINE-DM 6.25-15 MG/5ML PO SYRP
5.0000 mL | ORAL_SOLUTION | Freq: Four times a day (QID) | ORAL | 0 refills | Status: DC | PRN
Start: 2023-05-13 — End: 2023-05-31

## 2023-05-13 MED ORDER — NIRMATRELVIR/RITONAVIR (PAXLOVID) TABLET (RENAL DOSING): 2.0000 | ORAL_TABLET | Freq: Two times a day (BID) | ORAL | 0 refills | Status: AC

## 2023-05-13 NOTE — Assessment & Plan Note (Signed)
 Home test positive symptoms started 1 day ago. Rx paxlovid renally dosed (last GFR 49 fall 2024) and promethazine/dm cough syrup.

## 2023-05-13 NOTE — Progress Notes (Signed)
 Virtual Visit via Video Note  I connected with [name] on 05/13/23 at  3:20 PM EDT by a video enabled telemedicine application and verified that I am speaking with the correct person using two identifiers.  The patient and the provider were at separate locations throughout the entire encounter. Patient location: home, Provider location: work   I discussed the limitations of evaluation and management by telemedicine and the availability of in person appointments. The patient expressed understanding and agreed to proceed. The patient and the provider were the only parties present for the visit unless noted in HPI below.  History of Present Illness: The patient is a 89 y.o. female with visit for covid-19 positive. Started today. Has mucus and sore throat and weak.   Observations/Objective: Appearance: normal, breathing appears normal, coughing during visit, speaking in full sentences, casual grooming, abdomen does not appear distended, mental status is A and O times 3  Assessment and Plan: See problem oriented charting  Follow Up Instructions: Rx paxlovid and promethazine/dm cough syrup  I discussed the assessment and treatment plan with the patient. The patient was provided an opportunity to ask questions and all were answered. The patient agreed with the plan and demonstrated an understanding of the instructions.   The patient was advised to call back or seek an in-person evaluation if the symptoms worsen or if the condition fails to improve as anticipated.  Myrlene Broker, MD

## 2023-05-13 NOTE — Telephone Encounter (Signed)
 Copied from CRM [id number]. Topic: General - Other >> May 13, 2023 12:53 PM Whitney O wrote: Reason for CRM: patient is calling cause she has covid and patient is requesting that doctor send her in something for the covid . Patient took a covid test and it came back positive patient is requesting doctor send in some medication. Patient says also some cough medicine

## 2023-05-13 NOTE — Telephone Encounter (Signed)
 Spoke with pt. States that someone from our office had already called her and set her up with a video visit. Nothing further was needed.

## 2023-05-31 ENCOUNTER — Encounter: Payer: Self-pay | Admitting: Physician Assistant

## 2023-05-31 ENCOUNTER — Ambulatory Visit: Payer: PPO | Admitting: Physician Assistant

## 2023-05-31 VITALS — BP 137/75 | HR 76 | Ht 61.5 in | Wt 150.0 lb

## 2023-05-31 DIAGNOSIS — N3941 Urge incontinence: Secondary | ICD-10-CM | POA: Diagnosis not present

## 2023-05-31 DIAGNOSIS — G4733 Obstructive sleep apnea (adult) (pediatric): Secondary | ICD-10-CM | POA: Diagnosis not present

## 2023-05-31 LAB — BLADDER SCAN AMB NON-IMAGING

## 2023-05-31 MED ORDER — TROSPIUM CHLORIDE 20 MG PO TABS: 20.0000 mg | ORAL_TABLET | Freq: Every day | ORAL | 11 refills | Status: AC

## 2023-05-31 MED ORDER — TROSPIUM CHLORIDE ER 60 MG PO CP24: 1.0000 | ORAL_CAPSULE | Freq: Every day | ORAL | 11 refills | Status: AC

## 2023-05-31 NOTE — Progress Notes (Signed)
 05/31/2023 11:32 AM   Wanda Green 1934-09-12 [id number]  CC: Chief Complaint  Patient presents with   Other    Symptom recheck   HPI: Wanda Green is a 89 y.o. female with PMH OAB wet and incomplete bladder emptying who failed Myrbetriq due to cost and PTNS due to inefficacy who presents today for symptom recheck and PVR on trospium ER 60 mg.   Today she reports she is doing very well on trospium.  She has had no constipation, dry mouth, and dry eye, and in fact she realizes in retrospect that she was having bothersome dry mouth and difficulty swallowing on oxybutynin, both of which have resolved.  She still uses about 5 pads daily but describes normal frequency, significantly improved urgency, and nocturia x 1.    Overall she is very pleased and would like to continue this, however she notes that the medication tends to wear off by the end of the day so she wonders if she can augment with trospium IR.  PVR 26mL.  PMH: Past Medical History:  Diagnosis Date   Cancer (HCC) 2000   right breast stage 2 chemo and radiation   Celiac artery stenosis (HCC)    followed by vascular--- dr Demetrius Charity. [facility]vy ([facility] in [place])   Chronic rhinitis    Chronic venous insufficiency    Claustrophobia    "SEVERE"   Claustrophobia    Complication of anesthesia    Depression    Dysrhythmia 2016   GERD (gastroesophageal reflux disease)    GI bleed 09/22/2020   Heart murmur    sees dr Sharyn Lull q 3 months mild murmur per pt   Hiatal hernia    History of adenomatous polyp of colon    History of atrial fibrillation    cardiologist--- dr Sharyn Lull---  first dx 2014,  per pt when at time of recurrent flare-up ischemic colitis in 2016 she was in normal rhythm   History of cancer chemotherapy 2000   for breast cancer   History of cervical dysplasia age 67   s/p  cervical cone bx   History of external beam radiation therapy 2000   right breast cancer   History of ischemic colitis    followed by dr  Matthias Hughs  (w/ Deboraha Sprang)  pt hx recurrent ischemic or infectious colitis   History of ischemic colitis    History of lower GI bleeding    History of right breast cancer oncologist--- dr Cyndie Chime, Theron Arista in epic07-19-2016 no recurrence, released prn   dx 01/ 2000,  right breast cancer, Stage II, positive 2 nodes, ER+/  s/p  right breast lumpectomy w/ sln bx's in 2000;  completed chemo/ radiation in 2000   Hyperlipidemia    Hypertension    IDA (iron deficiency anemia) 2020   Myocardial infarction (HCC) 2016   Neuromuscular disorder (HCC)    neropathy lt leg   OA (osteoarthritis)    knees   OSA on CPAP cpap set on 10   followed by dr Vassie Loll (pulmonology) study in epic 09-01-2012  milds osa   Osteoporosis 06/2016   T score -3.0   Peripheral vascular disease (HCC)    vericose veins   PONV (postoperative nausea and vomiting)    Pre-diabetes    Pt denies   Right wrist fracture 04/22/2020    Surgical History: Past Surgical History:  Procedure Laterality Date   BREAST BIOPSY Right 2000   BREAST LUMPECTOMY WITH AXILLARY LYMPH NODE BIOPSY Right 2000   CARDIAC  CATHETERIZATION N/A 11/25/2014   Procedure: Left Heart Cath and Coronary Angiography;  Surgeon: Orpah Cobb, MD;  Location: [facility];  Service: Cardiovascular;  Laterality: N/A;   CATARACT EXTRACTION W/ INTRAOCULAR LENS  IMPLANT, BILATERAL Bilateral 2018   CERVICAL CONE BIOPSY  age 89   COLONOSCOPY WITH PROPOFOL N/A 09/01/2014   Procedure: COLONOSCOPY WITH PROPOFOL;  Surgeon: Bernette Redbird, MD;  Location: [facility] ENDOS[facility];  Service: Endoscopy;  Laterality: N/A;  this will be done unprepped   KNEE ARTHROSCOPY Bilateral right 02/ 2003;  left 07/ 2005  @[facility]    NASAL SINUS SURGERY  02/2013   including deviated septum repair   OPEN REDUCTION INTERNAL FIXATION (ORIF) DISTAL RADIAL FRACTURE Right 04/26/2020   Procedure: OPEN REDUCTION INTERNAL FIXATION (ORIF) RIGHT  DISTAL RADIAL FRACTURE;  Surgeon: Sheral Apley, MD;  Location: [facility]  [place];  Service: Orthopedics;  Laterality: Right;   ORIF WRIST FRACTURE Left 2001  @[facility]    TOTAL KNEE ARTHROPLASTY Left 04/09/2017   Procedure: LEFT TOTAL KNEE ARTHROPLASTY;  Surgeon: Sheral Apley, MD;  Location: [facility] OR;  Service: Orthopedics;  Laterality: Left;   TOTAL KNEE ARTHROPLASTY Right 09/20/2020   Procedure: TOTAL KNEE ARTHROPLASTY;  Surgeon: Sheral Apley, MD;  Location: [facility];  Service: Orthopedics;  Laterality: Right;   TUBAL LIGATION Bilateral 1975   VARICOSE VEIN SURGERY  05/2019   WRIST FRACTURE SURGERY Left ~ 2006    Home Medications:  Allergies as of 05/31/2023       Reactions   Ultram [tramadol] Nausea And Vomiting   Percocet [oxycodone-acetaminophen] Nausea And Vomiting   Codeine Nausea And Vomiting   Hydrocodone Nausea And Vomiting   Vibra-tab [doxycycline] Nausea And Vomiting        Medication List        Accurate as of May 31, 2023 11:32 AM. If you have any questions, ask your nurse or doctor.          STOP taking these medications    promethazine-dextromethorphan 6.25-15 MG/5ML syrup Commonly known as: PROMETHAZINE-DM Stopped by: Carman Ching       TAKE these medications    acetaminophen 500 MG tablet Commonly known as: TYLENOL Take 1,000 mg by mouth daily as needed for headache, fever, moderate pain or mild pain.   amLODipine 2.5 MG tablet Commonly known as: NORVASC Take 2.5 mg by mouth at bedtime.   aspirin EC 81 MG tablet Take 81 mg by mouth daily. Swallow whole.   atorvastatin 20 MG tablet Commonly known as: LIPITOR Take 20 mg by mouth at bedtime.   losartan 100 MG tablet Commonly known as: COZAAR Take 100 mg by mouth in the morning.   metoprolol succinate 25 MG 24 hr tablet Commonly known as: TOPROL-XL Take 25 mg by mouth in the morning. Take 25mg  in the morning and 50mg  at night.   metoprolol succinate 50 MG 24 hr tablet Commonly known as: TOPROL-XL Take 50 mg by mouth at bedtime. Take with  or immediately following a meal.   multivitamin tablet Take 1 tablet by mouth in the morning.   omeprazole 20 MG capsule Commonly known as: PRILOSEC Take 1 capsule (20 mg total) by mouth 2 (two) times daily. What changed:  when to take this reasons to take this   PROBIOTIC DAILY PO Take 1 tablet by mouth in the morning.   Trospium Chloride 60 MG Cp24 Take 1 capsule (60 mg total) by mouth daily.        Allergies:  Allergies  Allergen Reactions   Ultram [Tramadol] Nausea And Vomiting   Percocet [Oxycodone-Acetaminophen] Nausea And Vomiting   Codeine Nausea And Vomiting   Hydrocodone Nausea And Vomiting   Vibra-Tab [Doxycycline] Nausea And Vomiting    Family History: Family History  Problem Relation Age of Onset   Hypertension Mother    Heart failure Mother    Heart disease Mother    Hypertension Father    Heart disease Father    Hypertension Sister    Uterine cancer Sister    Hypertension Brother    Heart disease Brother     Social History:   reports that she quit smoking about 55 years ago. Her smoking use included cigarettes. She started smoking about 58 years ago. She has a 0.3 pack-year smoking history. She has never used smokeless tobacco. She reports that she does not drink alcohol and does not use drugs.  Physical Exam: BP 137/75   Pulse 76   Ht 5' 1.5" (1.562 m)   Wt 150 lb (68 kg)   LMP 07/22/2015   BMI 27.88 kg/m   Constitutional:  Alert and oriented, no acute distress, nontoxic appearing HEENT: [place], AT Cardiovascular: No clubbing, cyanosis, or edema Respiratory: Normal respiratory effort, no increased work of breathing Skin: No rashes, bruises or suspicious lesions Neurologic: Grossly intact, no focal deficits, moving all 4 extremities Psychiatric: Normal mood and affect  Laboratory Data: Results for orders placed or performed in visit on 05/31/23  BLADDER SCAN AMB NON-IMAGING   Collection Time: 05/31/23 11:31 AM  Result Value Ref Range    Scan Result 26ml    Assessment & Plan:   1. Urge incontinence of urine (Primary) She is emptying appropriately and dry mouth has resolved after switching from oxybutynin to trospium ER.  Will plan to continue this, okay to augment with trospium IR in the afternoon/evening.  We discussed that if she develops dry mouth on immediate release trospium, I would recommend returning to the ER formulation only.  She is in agreement with this plan. - BLADDER SCAN AMB NON-IMAGING - Trospium Chloride 60 MG CP24; Take 1 capsule (60 mg total) by mouth daily. Take in the morning.  Dispense: 30 capsule; Refill: 11 - trospium (SANCTURA) 20 MG tablet; Take 1 tablet (20 mg total) by mouth daily. Take in the afternoon/evening.  Dispense: 30 tablet; Refill: 11   Return in about 1 year (around 05/30/2024) for Annual OAB f/u with PVR.  Carman Ching, PA-C  [facility] [place] [address], [address] [place], [place] [address] [phone]

## 2023-07-11 ENCOUNTER — Encounter (HOSPITAL_COMMUNITY): Payer: Self-pay

## 2023-07-12 ENCOUNTER — Encounter: Payer: Self-pay | Admitting: Family

## 2023-07-12 ENCOUNTER — Ambulatory Visit (INDEPENDENT_AMBULATORY_CARE_PROVIDER_SITE_OTHER): Admitting: Family

## 2023-07-12 VITALS — BP 136/76 | HR 82 | Temp 97.7°F | Ht 61.5 in | Wt 162.0 lb

## 2023-07-12 DIAGNOSIS — R7303 Prediabetes: Secondary | ICD-10-CM

## 2023-07-12 DIAGNOSIS — I10 Essential (primary) hypertension: Secondary | ICD-10-CM | POA: Diagnosis not present

## 2023-07-12 DIAGNOSIS — M2550 Pain in unspecified joint: Secondary | ICD-10-CM

## 2023-07-12 DIAGNOSIS — D509 Iron deficiency anemia, unspecified: Secondary | ICD-10-CM | POA: Diagnosis not present

## 2023-07-12 DIAGNOSIS — E782 Mixed hyperlipidemia: Secondary | ICD-10-CM | POA: Diagnosis not present

## 2023-07-12 DIAGNOSIS — N1831 Chronic kidney disease, stage 3a: Secondary | ICD-10-CM | POA: Diagnosis not present

## 2023-07-12 LAB — CBC WITH DIFFERENTIAL/PLATELET
Basophils Absolute: 0 10*3/uL (ref 0.0–0.1)
Basophils Relative: 0.5 % (ref 0.0–3.0)
Eosinophils Absolute: 0.1 10*3/uL (ref 0.0–0.7)
Eosinophils Relative: 1.1 % (ref 0.0–5.0)
HCT: 31.6 % — ABNORMAL LOW (ref 36.0–46.0)
Hemoglobin: 10.4 g/dL — ABNORMAL LOW (ref 12.0–15.0)
Lymphocytes Relative: 18.1 % (ref 12.0–46.0)
Lymphs Abs: 1 10*3/uL (ref 0.7–4.0)
MCHC: 33.1 g/dL (ref 30.0–36.0)
MCV: 94.9 fl (ref 78.0–100.0)
Monocytes Absolute: 0.6 10*3/uL (ref 0.1–1.0)
Monocytes Relative: 10.1 % (ref 3.0–12.0)
Neutro Abs: 3.8 10*3/uL (ref 1.4–7.7)
Neutrophils Relative %: 70.2 % (ref 43.0–77.0)
Platelets: 200 10*3/uL (ref 150.0–400.0)
RBC: 3.33 Mil/uL — ABNORMAL LOW (ref 3.87–5.11)
RDW: 13.9 % (ref 11.5–15.5)
WBC: 5.4 10*3/uL (ref 4.0–10.5)

## 2023-07-12 LAB — COMPREHENSIVE METABOLIC PANEL WITH GFR
ALT: 16 U/L (ref 0–35)
AST: 17 U/L (ref 0–37)
Albumin: 4.6 g/dL (ref 3.5–5.2)
Alkaline Phosphatase: 52 U/L (ref 39–117)
BUN: 31 mg/dL — ABNORMAL HIGH (ref 6–23)
CO2: 27 meq/L (ref 19–32)
Calcium: 9.7 mg/dL (ref 8.4–10.5)
Chloride: 105 meq/L (ref 96–112)
Creatinine, Ser: 1.19 mg/dL (ref 0.40–1.20)
GFR: 41.4 mL/min — ABNORMAL LOW (ref >60.00)
Glucose, Bld: 100 mg/dL — ABNORMAL HIGH (ref 70–99)
Potassium: 4.6 meq/L (ref 3.5–5.1)
Sodium: 139 meq/L (ref 135–145)
Total Bilirubin: 0.6 mg/dL (ref 0.2–1.2)
Total Protein: 7.5 g/dL (ref 6.0–8.3)

## 2023-07-12 LAB — LIPID PANEL
Cholesterol: 120 mg/dL (ref 0–200)
HDL: 57.5 mg/dL (ref >39.00)
LDL Cholesterol: 42 mg/dL (ref 0–99)
NonHDL: 62.43
Total CHOL/HDL Ratio: 2
Triglycerides: 100 mg/dL (ref 0.0–149.0)
VLDL: 20 mg/dL (ref 0.0–40.0)

## 2023-07-12 LAB — IBC + FERRITIN
Ferritin: 16.1 ng/mL (ref 10.0–291.0)
Iron: 112 ug/dL (ref 42–145)
Saturation Ratios: 30.3 % (ref 20.0–50.0)
TIBC: 369.6 ug/dL (ref 250.0–450.0)
Transferrin: 264 mg/dL (ref 212.0–360.0)

## 2023-07-12 LAB — TSH: TSH: 1.89 u[IU]/mL (ref 0.35–5.50)

## 2023-07-12 LAB — C-REACTIVE PROTEIN: CRP: <1 mg/dL (ref 0.5–20.0)

## 2023-07-12 LAB — HEMOGLOBIN A1C: Hgb A1c MFr Bld: 5.9 % (ref 4.6–6.5)

## 2023-07-12 NOTE — Patient Instructions (Signed)
  I would try to stop atorvastatin  for about five days and then let me know if there is any improvement

## 2023-07-12 NOTE — Assessment & Plan Note (Signed)
 Suspect due to osteoarthritis in multiple joint areas however will order ANA CRP RF to r/o autoimmune disease.  Trial without statin x 4 days if no improvement restart If improvement of joint pains let me know and we'll consider change of statin  Limitations on treatment as NSAIDS cx with daily use of ASA  Tylenol  prn, heat/ice to site, icy hot

## 2023-07-12 NOTE — Assessment & Plan Note (Signed)
 Repeat cbc ibc ferritin pending results

## 2023-07-12 NOTE — Progress Notes (Signed)
 Established Patient Office Visit  Subjective:   Patient ID: Wanda Green, female    DOB: Aug 14, 1934  Age: 89 y.o. MRN: [id number]  CC:  Chief Complaint  Patient presents with   Acute Visit    Reports having pain "all over her body." She has been seen by ortho for this and states that they give her a steroid shot and a steroid pack, she finishes the meds and her pain comes right back.    HPI: Wanda Green is a 89 y.o. female presenting on 07/12/2023 for Acute Visit (Reports having pain "all over her body." She has been seen by ortho for this and states that they give her a steroid shot and a steroid pack, she finishes the meds and her pain comes right back.)  C/o 'soreness all of the time'  All over her body her arms and legs no matter what she does. If she exerts any effort her muscles and joints hurt. She is on asa so can not have NSAIDS. She does take tylenol  with mild relief of pain. Heat helps her a lot. She takes six tylenol  a day she states.   Saw orthopedist a few months ago and she had a joint injection and a steroid pack for her neck and there was some improvement, that pain specifically has not really come back.        ROS: Negative unless specifically indicated above in HPI.   Relevant past medical history reviewed and updated as indicated.   Allergies and medications reviewed and updated.   Current Outpatient Medications:    acetaminophen  (TYLENOL ) 500 MG tablet, Take 1,000 mg by mouth daily as needed for headache, fever, moderate pain or mild pain., Disp: , Rfl:    amLODipine  (NORVASC ) 2.5 MG tablet, Take 2.5 mg by mouth at bedtime., Disp: , Rfl: 3   aspirin  EC 81 MG tablet, Take 81 mg by mouth daily. Swallow whole., Disp: , Rfl:    atorvastatin  (LIPITOR) 20 MG tablet, Take 20 mg by mouth at bedtime., Disp: , Rfl:    losartan  (COZAAR ) 100 MG tablet, Take 100 mg by mouth in the morning., Disp: , Rfl:    metoprolol  succinate (TOPROL -XL) 25 MG 24 hr tablet, Take  25 mg by mouth in the morning. Take 25mg  in the morning and 50mg  at night., Disp: , Rfl:    metoprolol  succinate (TOPROL -XL) 50 MG 24 hr tablet, Take 50 mg by mouth at bedtime. Take with or immediately following a meal., Disp: , Rfl:    Multiple Vitamin (MULTIVITAMIN) tablet, Take 1 tablet by mouth in the morning., Disp: , Rfl:    omeprazole  (PRILOSEC) 20 MG capsule, Take 1 capsule (20 mg total) by mouth 2 (two) times daily. (Patient taking differently: Take 20 mg by mouth as needed.), Disp: 180 capsule, Rfl: 1   Probiotic Product (PROBIOTIC DAILY PO), Take 1 tablet by mouth in the morning., Disp: , Rfl:    trospium  (SANCTURA ) 20 MG tablet, Take 1 tablet (20 mg total) by mouth daily. Take in the afternoon/evening., Disp: 30 tablet, Rfl: 11   Trospium  Chloride 60 MG CP24, Take 1 capsule (60 mg total) by mouth daily. Take in the morning., Disp: 30 capsule, Rfl: 11  Allergies  Allergen Reactions   Ultram  [Tramadol ] Nausea And Vomiting   Percocet [Oxycodone -Acetaminophen ] Nausea And Vomiting   Codeine Nausea And Vomiting   Hydrocodone  Nausea And Vomiting   Vibra -Tab [Doxycycline ] Nausea And Vomiting    Objective:   BP 136/76 (  BP Location: Left Arm, Patient Position: Sitting, Cuff Size: Normal)   Pulse 82   Temp 97.7 F (36.5 C) (Temporal)   Ht 5' 1.5" (1.562 m)   Wt 162 lb (73.5 kg)   LMP 07/22/2015   SpO2 98%   BMI 30.11 kg/m    Physical Exam Vitals reviewed.  Constitutional:      General: She is not in acute distress.    Appearance: Normal appearance. She is normal weight. She is not ill-appearing, toxic-appearing or diaphoretic.  HENT:     Head: Normocephalic.  Cardiovascular:     Rate and Rhythm: Normal rate.  Pulmonary:     Effort: Pulmonary effort is normal.  Musculoskeletal:        General: Normal range of motion.     Cervical back: Spinous process tenderness present.     Comments: Multiple distal phalynx nodules bil  Good strength 5/5  Enlargement of proximal  knuckles multiple spots   Neurological:     General: No focal deficit present.     Mental Status: She is alert and oriented to person, place, and time. Mental status is at baseline.  Psychiatric:        Mood and Affect: Mood normal.        Behavior: Behavior normal.        Thought Content: Thought content normal.        Judgment: Judgment normal.     Assessment & Plan:  Polyarthralgia Assessment & Plan: Suspect due to osteoarthritis in multiple joint areas however will order ANA CRP RF to r/o autoimmune disease.  Trial without statin x 4 days if no improvement restart If improvement of joint pains let me know and we'll consider change of statin  Limitations on treatment as NSAIDS cx with daily use of ASA  Tylenol  prn, heat/ice to site, icy hot    Orders: -     TSH -     Rheumatoid factor -     C-reactive protein -     ANA Screen,IFA, with Reflex to Titer and Pattern (REFL)  Primary hypertension -     Comprehensive metabolic panel with GFR  Stage 3a chronic kidney disease (HCC) -     Comprehensive metabolic panel with GFR  Prediabetes -     Hemoglobin A1c  Hyperlipemia, mixed -     Lipid panel  Iron deficiency anemia, unspecified iron deficiency anemia type Assessment & Plan: Repeat cbc ibc ferritin pending results.    Orders: -     CBC with Differential/Platelet -     IBC + Ferritin     Follow up plan: Return in about 6 months (around 01/12/2024) for f/u CPE.  Felicita Horns, FNP

## 2023-07-15 ENCOUNTER — Encounter: Payer: Self-pay | Admitting: Family

## 2023-07-15 DIAGNOSIS — M1711 Unilateral primary osteoarthritis, right knee: Secondary | ICD-10-CM

## 2023-07-15 LAB — ANA SCREEN,IFA, WITH REFLEX TO TITER AND PATTERN (REFL): ANA SCREEN, IFA: NEGATIVE

## 2023-07-15 LAB — RHEUMATOID FACTOR: Rheumatoid fact SerPl-aCnc: <10 [IU]/mL (ref <14)

## 2023-07-16 MED ORDER — PREDNISONE 10 MG (21) PO TBPK: ORAL_TABLET | ORAL | 0 refills | Status: DC

## 2023-07-16 NOTE — Telephone Encounter (Signed)
 Could we please fax along her labs as requested?

## 2023-07-18 ENCOUNTER — Ambulatory Visit: Payer: PPO

## 2023-07-19 ENCOUNTER — Ambulatory Visit (HOSPITAL_BASED_OUTPATIENT_CLINIC_OR_DEPARTMENT_OTHER): Payer: PPO | Admitting: Pulmonary Disease

## 2023-07-19 DIAGNOSIS — I2510 Atherosclerotic heart disease of native coronary artery without angina pectoris: Secondary | ICD-10-CM | POA: Diagnosis not present

## 2023-07-19 DIAGNOSIS — E782 Mixed hyperlipidemia: Secondary | ICD-10-CM | POA: Diagnosis not present

## 2023-07-19 DIAGNOSIS — I10 Essential (primary) hypertension: Secondary | ICD-10-CM | POA: Diagnosis not present

## 2023-07-19 DIAGNOSIS — K219 Gastro-esophageal reflux disease without esophagitis: Secondary | ICD-10-CM | POA: Diagnosis not present

## 2023-07-25 ENCOUNTER — Encounter (HOSPITAL_BASED_OUTPATIENT_CLINIC_OR_DEPARTMENT_OTHER): Payer: Self-pay | Admitting: Pulmonary Disease

## 2023-07-25 ENCOUNTER — Ambulatory Visit (HOSPITAL_BASED_OUTPATIENT_CLINIC_OR_DEPARTMENT_OTHER): Admitting: Pulmonary Disease

## 2023-07-25 ENCOUNTER — Encounter (HOSPITAL_BASED_OUTPATIENT_CLINIC_OR_DEPARTMENT_OTHER): Payer: Self-pay

## 2023-07-26 ENCOUNTER — Ambulatory Visit: Admitting: Podiatry

## 2023-08-09 DIAGNOSIS — M545 Low back pain, unspecified: Secondary | ICD-10-CM | POA: Diagnosis not present

## 2023-08-21 ENCOUNTER — Ambulatory Visit: Payer: Self-pay

## 2023-08-21 ENCOUNTER — Telehealth: Admitting: Physician Assistant

## 2023-08-21 DIAGNOSIS — J329 Chronic sinusitis, unspecified: Secondary | ICD-10-CM | POA: Diagnosis not present

## 2023-08-21 DIAGNOSIS — R42 Dizziness and giddiness: Secondary | ICD-10-CM

## 2023-08-21 MED ORDER — AMOXICILLIN-POT CLAVULANATE 875-125 MG PO TABS: 1.0000 | ORAL_TABLET | Freq: Two times a day (BID) | ORAL | 0 refills | Status: AC

## 2023-08-21 MED ORDER — FLUTICASONE PROPIONATE 50 MCG/ACT NA SUSP: 2.0000 | Freq: Every day | NASAL | 0 refills | Status: AC

## 2023-08-21 NOTE — Patient Instructions (Signed)
 [name], thank you for joining Aminta Kales, PA-C for today's virtual visit.  While this provider is not your primary care provider (PCP), if your PCP is located in our provider database this encounter information will be shared with them immediately following your visit.   [facility] MyChart account gives you access to today's visit and all your visits, tests, and labs performed at [facility]  click here if you don't have a [facility] MyChart account or go to [email or web address]  Consent: (Patient) Wanda Green provided verbal consent for this virtual visit at the beginning of the encounter.  Current Medications:  Current Outpatient Medications:    amoxicillin -clavulanate (AUGMENTIN ) 875-125 MG tablet, Take 1 tablet by mouth 2 (two) times daily for 7 days., Disp: 14 tablet, Rfl: 0   fluticasone (FLONASE) 50 MCG/ACT nasal spray, Place 2 sprays into both nostrils daily., Disp: 16 g, Rfl: 0   acetaminophen  (TYLENOL ) 500 MG tablet, Take 1,000 mg by mouth daily as needed for headache, fever, moderate pain or mild pain., Disp: , Rfl:    amLODipine  (NORVASC ) 2.5 MG tablet, Take 2.5 mg by mouth at bedtime., Disp: , Rfl: 3   aspirin  EC 81 MG tablet, Take 81 mg by mouth daily. Swallow whole., Disp: , Rfl:    atorvastatin  (LIPITOR) 20 MG tablet, Take 20 mg by mouth at bedtime., Disp: , Rfl:    losartan  (COZAAR ) 100 MG tablet, Take 100 mg by mouth in the morning., Disp: , Rfl:    metoprolol  succinate (TOPROL -XL) 25 MG 24 hr tablet, Take 25 mg by mouth in the morning. Take 25mg  in the morning and 50mg  at night., Disp: , Rfl:    metoprolol  succinate (TOPROL -XL) 50 MG 24 hr tablet, Take 50 mg by mouth at bedtime. Take with or immediately following a meal., Disp: , Rfl:    Multiple Vitamin (MULTIVITAMIN) tablet, Take 1 tablet by mouth in the morning., Disp: , Rfl:    omeprazole  (PRILOSEC) 20 MG capsule, Take 1 capsule (20 mg total) by mouth 2 (two) times daily. (Patient  taking differently: Take 20 mg by mouth as needed.), Disp: 180 capsule, Rfl: 1   predniSONE  (STERAPRED UNI-PAK 21 TAB) 10 MG (21) TBPK tablet, Take as directed, Disp: 1 each, Rfl: 0   Probiotic Product (PROBIOTIC DAILY PO), Take 1 tablet by mouth in the morning., Disp: , Rfl:    trospium  (SANCTURA ) 20 MG tablet, Take 1 tablet (20 mg total) by mouth daily. Take in the afternoon/evening., Disp: 30 tablet, Rfl: 11   Trospium  Chloride 60 MG CP24, Take 1 capsule (60 mg total) by mouth daily. Take in the morning., Disp: 30 capsule, Rfl: 11   Medications ordered in this encounter:  Meds ordered this encounter  Medications   amoxicillin -clavulanate (AUGMENTIN ) 875-125 MG tablet    Sig: Take 1 tablet by mouth 2 (two) times daily for 7 days.    Dispense:  14 tablet    Refill:  0   fluticasone (FLONASE) 50 MCG/ACT nasal spray    Sig: Place 2 sprays into both nostrils daily.    Dispense:  16 g    Refill:  0     *If you need refills on other medications prior to your next appointment, please contact your pharmacy*  Follow-Up: Call back or seek an in-person evaluation if the symptoms worsen or if the condition fails to improve as anticipated.  [name] [phone]  Other Instructions You were given a prescription for  antibiotics and fluticasone. Please take the antibiotic prescription fully.   You need to be seen for an in person visit today so that you can have a neurologic exam completed.   If you have been instructed to have an in-person evaluation today at a local Urgent Care facility, please use the link below. It will take you to a list of all of our available [facility], including address, phone number and hours of operation. Please do not delay care.  [facility]  If you or a family member do not have a primary care provider, use the link below to schedule a visit and establish care. When you choose a [facility] primary care physician or  advanced practice provider, you gain a long-term partner in health. Find a Primary Care Provider  Learn more about [facility] in-office and virtual care options: [facility] - Get Care Now

## 2023-08-21 NOTE — Telephone Encounter (Signed)
 FYI Only or Action Required?: FYI only for provider  Patient was last seen in primary care on 07/12/2023 by Felicita Horns, FNP. Called Nurse Triage reporting Sore Throat, Dizziness, and Otalgia. Symptoms began a week ago. Interventions attempted: OTC medications: cold medicine and Rest, hydration, or home remedies. Symptoms are: unchanged.  Triage Disposition: See Physician Within 24 Hours  Patient/caregiver understands and will follow disposition?: Yes  Copied from CRM [id number]. Topic: Clinical - Red Word Triage >> Aug 21, 2023  9:12 AM Dewanda Foots wrote: Red Word that prompted transfer to Nurse Triage: Pt states she has a cold and is sick for over a week  Pt states that she is dizzy, red/brown mucus, sore throat, and ear ache.  States blood pressure is also slightly elevated-157/75 Reason for Disposition  Earache  (Exceptions: brief ear pain of < 60 minutes duration, earache occurring during air travel  Answer Assessment - Initial Assessment Questions 1. LOCATION: Which ear is involved?     Bilateral 2. ONSET: When did the ear start hurting      One week 3. SEVERITY: How bad is the pain?  (Scale 1-10; mild, moderate or severe)   - MILD (1-3): doesn't interfere with normal activities    - MODERATE (4-7): interferes with normal activities or awakens from sleep    - SEVERE (8-10): excruciating pain, unable to do any normal activities      Moderate 4. URI SYMPTOMS: Do you have a runny nose or cough?     Sore throat, productive cough, nasal discharge 5. FEVER: Do you have a fever? If Yes, ask: What is your temperature, how was it measured, and when did it start?     Denies-but does go from warm to cool feeling 6. OTHER SYMPTOMS: Do you have any other symptoms? (e.g., headache, stiff neck, dizziness, vomiting, runny nose, decreased hearing)     Vertigo was intense this morning, now mild.    Additional info: No ride today for in person visit, would like virtual  visit.  Protocols used: Louie Rover

## 2023-08-21 NOTE — Telephone Encounter (Signed)
 NOTED

## 2023-08-21 NOTE — Progress Notes (Signed)
 Wanda Green,you are scheduled for a virtual visit with your provider today.    Just as we do with appointments in the office, we must obtain your consent to participate.  Your consent will be active for this visit and any virtual visit you may have with one of our providers in the next 365 days.    If you have a MyChart account, I can also send a copy of this consent to you electronically.  All virtual visits are billed to your insurance company just like a traditional visit in the office.  As this is a virtual visit, video technology does not allow for your provider to perform a traditional examination.  This may limit your provider's ability to fully assess your condition.  If your provider identifies any concerns that need to be evaluated in person or the need to arrange testing such as labs, EKG, etc, we will make arrangements to do so.    Although advances in technology are sophisticated, we cannot ensure that it will always work on either your end or our end.  If the connection with a video visit is poor, we may have to switch to a telephone visit.  With either a video or telephone visit, we are not always able to ensure that we have a secure connection.   I need to obtain your verbal consent now.   Are you willing to proceed with your visit today?   Wanda Green has provided verbal consent on 08/21/2023 for a virtual visit (video or telephone).   Wanda Kales, Wanda Green 08/21/2023  11:32 AM   Date:  08/21/2023   ID:  Wanda Green, Wanda Green March 30, 1934, MRN [id number]  Patient Location: Home Provider Location: Home Office   Participants: Patient and Provider for Visit and Wrap up  Method of visit: Video portion of visit did not work Location of Patient: Home Location of Provider: Home Office Consent was obtain for visit over the video. Services rendered by provider: Visit was performed via video  A video enabled telemedicine application was used and I verified that I am speaking with  the correct person using two identifiers.  PCP:  Felicita Horns, FNP   Chief Complaint:  uri x weeks  History of Present Illness:    Wanda Green is a 89 y.o. female with history as stated below. Presents video telehealth for an acute care visit  Pt states she started having rhinorrhea/congestion for the last few weeks. She reports bilat ear pain, sore throat, mucous production. She reports she woke up this morning and had dizziness. She describes symptoms as a room spinning sensation has had to hold onto things when she walks.  She states she nearly fell this morning when she first got up. Denies vision changes. Does report photophobia and a headache. Denies anticoagulated. Denies unilateral numbness/weakness.  Past Medical, Surgical, Social History, Allergies, and Medications have been Reviewed.  Past Medical History:  Diagnosis Date   Cancer [facility]) 2000   right breast stage 2 chemo and radiation   Celiac artery stenosis (HCC)    followed by vascular--- dr Arlester Ladd. levy ([facility] in [place])   Chronic rhinitis    Chronic venous insufficiency    Claustrophobia    SEVERE   Claustrophobia    Complication of anesthesia    Depression    Dysrhythmia 2016   GERD (gastroesophageal reflux disease)    GI bleed 09/22/2020   Heart murmur    sees dr Glena Landau q 3 months mild  murmur per pt   Hiatal hernia    History of adenomatous polyp of colon    History of atrial fibrillation    cardiologist--- dr Glena Landau---  first dx 2014,  per pt when at time of recurrent flare-up ischemic colitis in 2016 she was in normal rhythm   History of cancer chemotherapy 2000   for breast cancer   History of cervical dysplasia age 53   s/p  cervical cone bx   History of external beam radiation therapy 2000   right breast cancer   History of ischemic colitis    followed by dr Dellis Fermo  (w/ Cherene Core)  pt hx recurrent ischemic or infectious colitis   History of ischemic colitis    History of lower GI bleeding     History of right breast cancer oncologist--- dr Isidor Marek, Holli Lunger in epic07-19-2016 no recurrence, released prn   dx 01/ 2000,  right breast cancer, Stage II, positive 2 nodes, ER+/  s/p  right breast lumpectomy w/ sln bx's in 2000;  completed chemo/ radiation in 2000   Hyperlipidemia    Hypertension    IDA (iron deficiency anemia) 2020   Myocardial infarction (HCC) 2016   Neuromuscular disorder (HCC)    neropathy lt leg   OA (osteoarthritis)    knees   OSA on CPAP cpap set on 10   followed by dr Villa Greaser (pulmonology) study in epic 09-01-2012  milds osa   Osteoporosis 06/2016   T score -3.0   Peripheral vascular disease (HCC)    vericose veins   PONV (postoperative nausea and vomiting)    Pre-diabetes    Pt denies   Right wrist fracture 04/22/2020    Current Meds  Medication Sig   amoxicillin -clavulanate (AUGMENTIN ) 875-125 MG tablet Take 1 tablet by mouth 2 (two) times daily for 7 days.   fluticasone (FLONASE) 50 MCG/ACT nasal spray Place 2 sprays into both nostrils daily.     Allergies:   Ultram  [tramadol ], Percocet [oxycodone -acetaminophen ], Codeine, Hydrocodone , and Vibra -tab [doxycycline ]   ROS See HPI for history of present illness.  Physical Exam            MDM: pt with uri sxs likely related to sinusitis. Will tx with abx and fluticasone. She is also having vertiginous sxs that almost lead to a fall today. Unable to see patient in video today and unable to complete neuro exam, while history suggests that this is likely peripheral vertigo, a neuro exam needs to be completed. Pt is high risk for cva based on her comorbidities and I advised an in person visit today for a physical exam at the least. She is agreeable to this.    Tests Ordered: No orders of the defined types were placed in this encounter.   Medication Changes: Meds ordered this encounter  Medications   amoxicillin -clavulanate (AUGMENTIN ) 875-125 MG tablet    Sig: Take 1 tablet by mouth 2 (two) times daily  for 7 days.    Dispense:  14 tablet    Refill:  0   fluticasone (FLONASE) 50 MCG/ACT nasal spray    Sig: Place 2 sprays into both nostrils daily.    Dispense:  16 g    Refill:  0     Disposition:  Follow up  Signed, Wanda Kales, Wanda Green  08/21/2023 11:32 AM

## 2023-08-22 ENCOUNTER — Telehealth: Payer: Self-pay | Admitting: Family

## 2023-08-22 NOTE — Telephone Encounter (Signed)
 Called pt and schedule appt

## 2023-08-22 NOTE — Telephone Encounter (Signed)
 Patients Daughter dropped off forms to be completed by provider placed in box at front desk

## 2023-08-22 NOTE — Telephone Encounter (Signed)
 Form at [employer]. Please schedule appt for form completion.

## 2023-08-27 ENCOUNTER — Ambulatory Visit: Admitting: Podiatry

## 2023-08-31 DIAGNOSIS — G4733 Obstructive sleep apnea (adult) (pediatric): Secondary | ICD-10-CM | POA: Diagnosis not present

## 2023-09-03 ENCOUNTER — Ambulatory Visit (INDEPENDENT_AMBULATORY_CARE_PROVIDER_SITE_OTHER): Admitting: Family

## 2023-09-03 ENCOUNTER — Encounter: Payer: Self-pay | Admitting: Family

## 2023-09-03 VITALS — BP 128/70 | HR 80 | Temp 98.3°F | Resp 16 | Ht 61.5 in | Wt 160.0 lb

## 2023-09-03 DIAGNOSIS — Z0282 Encounter for adoption services: Secondary | ICD-10-CM | POA: Diagnosis not present

## 2023-09-03 DIAGNOSIS — Z6221 Child in welfare custody: Secondary | ICD-10-CM | POA: Insufficient documentation

## 2023-09-03 NOTE — Assessment & Plan Note (Signed)
 Not high risk for communicable disease  Tb questionnaire negative  No chronic or acute comorbidity pt overall stable.  No limitations to physical activity Phq 9 and gad 7 completed, no evidence of anxiety depression Stable home environment  From a medical perspective I deem pt suitable to reside as a household member ina home where foster children are present.

## 2023-09-03 NOTE — Progress Notes (Signed)
 Established Patient Office Visit  Subjective:      CC:  Chief Complaint  Patient presents with   Medical Management of Chronic Issues    HPI: Wanda Green is a 89 y.o. female presenting on 09/03/2023 for Medical Management of Chronic Issues . Currently in a home with her daughter who has two children one of which has been adopted (age 34) and another child that is turning 53 y/o and is a foster child about to be adopted her her daughter. Angel has a good relationship with the girls at home.   Overall stable, doing well without any acute medical concerns. Denies symptoms of depression and or anxiety.   Completed TB risk questionnaire as follows:  Were you born outside [place]  in [place], [place], [place] or [place]? No.  Have you traveled outside of [place]  and lived for more than one month? No.   Do you have a compromised immune system such as from the following: HIV/AIDs, organ or bone  marrow transplant, diabetes, immunosuppressive medications, leukemia, lymphoma, cancer of head or neck, gastrectomy, or jejunal bypass, end stage renal disease on dialysis or silicosis? No.  Have you ever done one of the following? Used crack cocaine, injected illegal drugs, worked or resided in jail or prison, worked or resided at a homeless shelter, or worked as a Research scientist (physical sciences) in Careers information officer with patients? No.  Have you ever been exposed to anyone with infectious tb? No.  Completed TB symptom questionnaire as follows:   Do you currently have any of the following symptoms?  Unexplained cough more than 3 weeks? No. Unexplained fever lasting more than 3 weeks? No. Night sweats, that are leaving bedclothes and sheets wet? No. Shortness of breath? No. Chest pain? No. Unintentional weight loss? No. Unexplained fatigued? No.  HTN: history MI. Afib. Sees cardiologist regularly every three months. Stable on amlodipine  2.5 mg once daily, Asprin 81 mg once daily,  metoprolol  25 mg in am 50 mg at bedtime, losartan  100 mg and atorvastatin  20 mg of which she is compliant. No cp palp and or worsening DOE. She is overall stable with her heart conditions blood pressure is well control.   OSA: compliant with her CPAP. Without symptoms. No snoring or afternoon headaches. Managed by pulmonary.   Prediabetes: overall well controlled. Diet controlled. Lab Results  Component Value Date   HGBA1C 5.9 07/12/2023   CKD stage 3 a: kidney function remains stable.   Urge incontinence of urine, followed by urology. Managed with trospium , overall stable with good improvement of incontinence. Follow up annually.      Social history:  Relevant past medical, surgical, family and social history reviewed and updated as indicated. Interim medical history since our last visit reviewed.  Allergies and medications reviewed and updated.  DATA REVIEWED: CHART IN EPIC     ROS: Negative unless specifically indicated above in HPI.    Current Outpatient Medications:    acetaminophen  (TYLENOL ) 500 MG tablet, Take 1,000 mg by mouth daily as needed for headache, fever, moderate pain or mild pain., Disp: , Rfl:    amLODipine  (NORVASC ) 2.5 MG tablet, Take 2.5 mg by mouth at bedtime., Disp: , Rfl: 3   aspirin  EC 81 MG tablet, Take 81 mg by mouth daily. Swallow whole., Disp: , Rfl:    atorvastatin  (LIPITOR) 20 MG tablet, Take 20 mg by mouth at bedtime., Disp: , Rfl:    fluticasone  (FLONASE ) 50 MCG/ACT nasal spray, Place 2 sprays into both  nostrils daily., Disp: 16 g, Rfl: 0   losartan  (COZAAR ) 100 MG tablet, Take 100 mg by mouth in the morning., Disp: , Rfl:    metoprolol  succinate (TOPROL -XL) 25 MG 24 hr tablet, Take 25 mg by mouth in the morning. Take 25mg  in the morning and 50mg  at night., Disp: , Rfl:    metoprolol  succinate (TOPROL -XL) 50 MG 24 hr tablet, Take 50 mg by mouth at bedtime. Take with or immediately following a meal., Disp: , Rfl:    Multiple Vitamin  (MULTIVITAMIN) tablet, Take 1 tablet by mouth in the morning., Disp: , Rfl:    predniSONE  (STERAPRED UNI-PAK 21 TAB) 10 MG (21) TBPK tablet, Take as directed, Disp: 1 each, Rfl: 0   Probiotic Product (PROBIOTIC DAILY PO), Take 1 tablet by mouth in the morning., Disp: , Rfl:    trospium  (SANCTURA ) 20 MG tablet, Take 1 tablet (20 mg total) by mouth daily. Take in the afternoon/evening., Disp: 30 tablet, Rfl: 11   Trospium  Chloride 60 MG CP24, Take 1 capsule (60 mg total) by mouth daily. Take in the morning., Disp: 30 capsule, Rfl: 11      Objective:    BP 128/70   Pulse 80   Temp 98.3 F (36.8 C)   Resp 16   Ht 5' 1.5 (1.562 m)   Wt 160 lb (72.6 kg)   LMP 07/22/2015   SpO2 99%   BMI 29.74 kg/m   Wt Readings from Last 3 Encounters:  09/03/23 160 lb (72.6 kg)  07/12/23 162 lb (73.5 kg)  05/31/23 150 lb (68 kg)    Physical Exam Vitals reviewed.  Constitutional:      General: She is not in acute distress.    Appearance: Normal appearance. She is normal weight. She is not ill-appearing, toxic-appearing or diaphoretic.  HENT:     Head: Normocephalic.   Cardiovascular:     Rate and Rhythm: Normal rate.  Pulmonary:     Effort: Pulmonary effort is normal.   Musculoskeletal:        General: Normal range of motion.   Neurological:     General: No focal deficit present.     Mental Status: She is alert and oriented to person, place, and time. Mental status is at baseline.   Psychiatric:        Mood and Affect: Mood normal.        Behavior: Behavior normal.        Thought Content: Thought content normal.        Judgment: Judgment normal.           Assessment & Plan:  Foster care child Assessment & Plan: Not high risk for communicable disease  Tb questionnaire negative  No chronic or acute comorbidity pt overall stable.  No limitations to physical activity Phq 9 and gad 7 completed, no evidence of anxiety depression Stable home environment  From a medical  perspective I deem pt suitable to reside as a household member ina home where foster children are present.       Return if symptoms worsen or fail to improve.  Ginger Patrick, MSN, APRN, FNP-C [facility]

## 2023-09-23 ENCOUNTER — Ambulatory Visit: Payer: Self-pay

## 2023-09-23 NOTE — Telephone Encounter (Signed)
 FYI Only or Action Required?: FYI only for provider.  Patient was last seen in primary care on 09/03/2023 by Corwin Antu, FNP.  Called Nurse Triage reporting Dizziness.  Symptoms began several weeks ago.  Interventions attempted: Nothing.  Symptoms are: unchanged.  Triage Disposition: See PCP Within 2 Weeks  Patient/caregiver understands and will follow disposition?: Yes, will follow disposition  Pt states that she has been seen for this before that it had improved and now is worsening again. Hx of sinus infections, states this feels similar.   Copied from CRM [id number]. Topic: Clinical - Red Word Triage >> Sep 23, 2023 12:07 PM Macario HERO wrote: Red Word that prompted transfer to Nurse Triage: Patient stated she may have a sinus infection and feeling dizzy. Reason for Disposition  [1] MILD dizziness (e.g., walking normally) AND [2] has been evaluated by doctor (or NP/PA) for this  Answer Assessment - Initial Assessment Questions 1. DESCRIPTION: Describe your dizziness.     Close eyes seems to get worse 3. VERTIGO: Do you feel like either you or the room is spinning or tilting? (i.e., vertigo)     Feels like I'm spinning, my head 4. SEVERITY: How bad is it?  Do you feel like you are going to faint? Can you stand and walk?     Still can walk, just careful 5. ONSET:  When did the dizziness begin?     weeks 6. AGGRAVATING FACTORS: Does anything make it worse? (e.g., standing, change in head position)     denies 8. CAUSE: What do you think is causing the dizziness? (e.g., decreased fluids or food, diarrhea, emotional distress, heat exposure, new medicine, sudden standing, vomiting; unknown)     Yes, was told it was a sinus infection,  10. OTHER SYMPTOMS: Do you have any other symptoms? (e.g., fever, chest pain, vomiting, diarrhea, bleeding)       Mucus, denies difficulty breathing, denies fever  Protocols used: Dizziness - Lightheadedness-A-AH

## 2023-09-23 NOTE — Telephone Encounter (Signed)
 NOTED

## 2023-09-24 ENCOUNTER — Ambulatory Visit: Admitting: Podiatry

## 2023-09-24 ENCOUNTER — Encounter: Payer: Self-pay | Admitting: Internal Medicine

## 2023-09-24 ENCOUNTER — Ambulatory Visit (INDEPENDENT_AMBULATORY_CARE_PROVIDER_SITE_OTHER)

## 2023-09-24 ENCOUNTER — Ambulatory Visit (INDEPENDENT_AMBULATORY_CARE_PROVIDER_SITE_OTHER): Admitting: Internal Medicine

## 2023-09-24 VITALS — Ht 61.5 in | Wt 160.0 lb

## 2023-09-24 VITALS — BP 120/72 | HR 71 | Temp 97.8°F | Ht 61.5 in | Wt 160.0 lb

## 2023-09-24 DIAGNOSIS — M2011 Hallux valgus (acquired), right foot: Secondary | ICD-10-CM

## 2023-09-24 DIAGNOSIS — D2371 Other benign neoplasm of skin of right lower limb, including hip: Secondary | ICD-10-CM

## 2023-09-24 DIAGNOSIS — M79674 Pain in right toe(s): Secondary | ICD-10-CM

## 2023-09-24 DIAGNOSIS — J0141 Acute recurrent pansinusitis: Secondary | ICD-10-CM | POA: Insufficient documentation

## 2023-09-24 DIAGNOSIS — M79675 Pain in left toe(s): Secondary | ICD-10-CM

## 2023-09-24 DIAGNOSIS — B351 Tinea unguium: Secondary | ICD-10-CM | POA: Diagnosis not present

## 2023-09-24 MED ORDER — AMOXICILLIN-POT CLAVULANATE 875-125 MG PO TABS
1.0000 | ORAL_TABLET | Freq: Two times a day (BID) | ORAL | 1 refills | Status: DC
Start: 2023-09-24 — End: 2023-10-01

## 2023-09-24 NOTE — Assessment & Plan Note (Signed)
 Partial response to augmentin  Allergic to doxy  Discussed options Will try the augmentin  again 875mg  for 10-20 days depending on response Restart the flonasae

## 2023-09-24 NOTE — Progress Notes (Signed)
 Chief Complaint  Patient presents with   Nail Problem    Pt is here for nail trim and discuss issue with right foot bunion states her and Dr Wanda Green has discusses this before and would need surgery but wants to wait until the fall to do so.    Subjective: 89 y.o. female presenting to the office today for evaluation of pain and tenderness associated to the right forefoot.  Patient believes that her bunion deformity is pressing against the great toe and causing significant pain and tenderness.  She says that she wears wide fitting shoes but continues to have pain and tenderness almost on a daily basis with ambulation.  Patient also has pain and tenderness associated to the toenails bilateral 1-5.  She says that she is unable to trim her own nails.  She is requesting nail debridement today.  They are tender especially in close toed shoes   Past Medical History:  Diagnosis Date   Cancer (HCC) 2000   right breast stage 2 chemo and radiation   Celiac artery stenosis (HCC)    followed by vascular--- dr Wanda Green ([facility] in [place])   Chronic rhinitis    Chronic venous insufficiency    Claustrophobia    SEVERE   Claustrophobia    Complication of anesthesia    Depression    Dysrhythmia 2016   GERD (gastroesophageal reflux disease)    GI bleed 09/22/2020   Heart murmur    sees dr Wanda Green q 3 months mild murmur per pt   Hiatal hernia    History of adenomatous polyp of colon    History of atrial fibrillation    cardiologist--- dr Wanda Green---  first dx 2014,  per pt when at time of recurrent flare-up ischemic colitis in 2016 she was in normal rhythm   History of cancer chemotherapy 2000   for breast cancer   History of cervical dysplasia age 7   s/p  cervical cone bx   History of external beam radiation therapy 2000   right breast cancer   History of ischemic colitis    followed by dr Wanda Green  (w/ Wanda Green)  pt hx recurrent ischemic or infectious colitis   History of ischemic colitis     History of lower GI bleeding    History of right breast cancer oncologist--- dr Wanda Green in epic07-19-2016 no recurrence, released prn   dx 01/ 2000,  right breast cancer, Stage II, positive 2 nodes, ER+/  s/p  right breast lumpectomy w/ sln bx's in 2000;  completed chemo/ radiation in 2000   Hyperlipidemia    Hypertension    IDA (iron deficiency anemia) 2020   Myocardial infarction (HCC) 2016   Neuromuscular disorder (HCC)    neropathy lt leg   OA (osteoarthritis)    knees   OSA on CPAP cpap set on 10   followed by dr Wanda Green (pulmonology) study in epic 09-01-2012  milds osa   Osteoporosis 06/2016   T score -3.0   Peripheral vascular disease (HCC)    vericose veins   PONV (postoperative nausea and vomiting)    Pre-diabetes    Pt denies   Right wrist fracture 04/22/2020    Past Surgical History:  Procedure Laterality Date   BREAST BIOPSY Right 2000   BREAST LUMPECTOMY WITH AXILLARY LYMPH NODE BIOPSY Right 2000   CARDIAC CATHETERIZATION N/A 11/25/2014   Procedure: Left Heart Cath and Coronary Angiography;  Surgeon: Wanda Negri, MD;  Location: [facility] INVASIVE [facility];  Service:  Cardiovascular;  Laterality: N/A;   CATARACT EXTRACTION W/ INTRAOCULAR LENS  IMPLANT, BILATERAL Bilateral 2018   CERVICAL CONE BIOPSY  age 19   COLONOSCOPY WITH PROPOFOL  N/A 09/01/2014   Procedure: COLONOSCOPY WITH PROPOFOL ;  Surgeon: Wanda Bunk, MD;  [facility] END[facility]COPY;  Service: Endoscopy;  Laterality: N/A;  this will be done unprepped   KNEE ARTHROSCOPY Bilateral right 02/ 2003;  left 07/ 2005  @[facility]    NASAL SINUS SURGERY  02/2013   including deviated septum repair   OPEN REDUCTION INTERNAL FIXATION (ORIF) DISTAL RADIAL FRACTURE Right 04/26/2020   Procedure: OPEN REDUCTION INTERNAL FIXATION (ORIF) RIGHT  DISTAL RADIAL FRACTURE;  Surgeon: Wanda Evalene BIRCH, MD;  Location: [facility] [place];  Service: Orthopedics;  Laterality: Right;   ORIF WRIST FRACTURE Left 2001  @[facility]    TOTAL KNEE  ARTHROPLASTY Left 04/09/2017   Procedure: LEFT TOTAL KNEE ARTHROPLASTY;  Surgeon: Wanda Evalene BIRCH, MD;  Location: [facility] OR;  Service: Orthopedics;  Laterality: Left;   TOTAL KNEE ARTHROPLASTY Right 09/20/2020   Procedure: TOTAL KNEE ARTHROPLASTY;  Surgeon: Wanda Evalene BIRCH, MD;  Location: [facility];  Service: Orthopedics;  Laterality: Right;   TUBAL LIGATION Bilateral 1975   VARICOSE VEIN SURGERY  05/2019   WRIST FRACTURE SURGERY Left ~ 2006    Allergies  Allergen Reactions   Ultram  [Tramadol ] Nausea And Vomiting   Percocet [Oxycodone -Acetaminophen ] Nausea And Vomiting   Codeine Nausea And Vomiting   Hydrocodone  Nausea And Vomiting   Vibra -Tab [Doxycycline ] Nausea And Vomiting     Objective:  Physical Exam General: Alert and oriented x3 in no acute distress  Dermatology: Hyperkeratotic lesion(s) present on the distal aspect of the medial right second digit pressing against the adjacent toe. Pain on palpation with a central nucleated core noted. Skin is warm, dry and supple bilateral lower extremities. Negative for open lesions or macerations. Hyperkeratotic elongated nails also noted 1-5 bilateral  Vascular: Palpable pedal pulses bilaterally. No edema or erythema noted. Capillary refill within normal limits.  Neurological: Grossly intact via light touch  Musculoskeletal Exam: Pain on palpation at the keratotic lesion(s) noted.  Moderate hammertoe deformity noted with hallux valgus deformity and mild degenerative changes.  Hammertoe contracture deformity right second digit  Radiographic exam RT foot 09/24/2023: Hallux valgus deformity noted with increased intermetatarsal angle.  Hammertoe contracture deformity also noted to the second digit right foot  Assessment: 1.  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  Hammertoe deformity second digit right foot 3.  Moderate hallux valgus deformity right foot 4.  Pain due to  onychomycosis of toenails both   Plan of Care:  -Patient evaluated -Excisional debridement of keratoic lesion(s) using a chisel blade was performed without incident.  -Salicylic acid applied with a bandaid - Recommend OTC corn and callus remover PRN -Mechanical debridement of nails 1-5 bilateral performed using a nail nipper without incident or bleeding -In regards to the hallux valgus and hammertoe deformity, I do believe the majority the patient's symptoms are coming from the symptomatic skin lesion versus the structural deformity of the hammertoe and bunion.  Surgery was discussed today however for now we will go ahead and continue to pursue conservative management -Return to clinic 3 months routine footcare  Thresa EMERSON Sar, DPM [facility]  Dr. Thresa EMERSON Sar, DPM    [address]  [place], [place] [address]                Office [phone]  Fax [phone]

## 2023-09-24 NOTE — Progress Notes (Signed)
 Subjective:    Patient ID: Wanda Green, female    DOB: 03/04/1935, 89 y.o.   MRN: [id number]  HPI Here due to recurrent sinus symptoms  Had been seen a month ago for sinus infection Had been having symptoms for 2 weeks by then Got augmentin  for a week--using flonase  Felt better but not back to normal  Now is worsening again Dizzy yesterday--decongestant helped Bad in the morning No fever Some cough--from post nasal drip No SOB  Current Outpatient Medications on File Prior to Visit  Medication Sig Dispense Refill   acetaminophen  (TYLENOL ) 500 MG tablet Take 1,000 mg by mouth daily as needed for headache, fever, moderate pain or mild pain.     amLODipine  (NORVASC ) 2.5 MG tablet Take 2.5 mg by mouth at bedtime.  3   aspirin  EC 81 MG tablet Take 81 mg by mouth daily. Swallow whole.     atorvastatin  (LIPITOR) 20 MG tablet Take 20 mg by mouth at bedtime.     fluticasone  (FLONASE ) 50 MCG/ACT nasal spray Place 2 sprays into both nostrils daily. 16 g 0   losartan  (COZAAR ) 100 MG tablet Take 100 mg by mouth in the morning.     metoprolol  succinate (TOPROL -XL) 25 MG 24 hr tablet Take 25 mg by mouth in the morning. Take 25mg  in the morning and 50mg  at night.     metoprolol  succinate (TOPROL -XL) 50 MG 24 hr tablet Take 50 mg by mouth at bedtime. Take with or immediately following a meal.     Multiple Vitamin (MULTIVITAMIN) tablet Take 1 tablet by mouth in the morning.     Probiotic Product (PROBIOTIC DAILY PO) Take 1 tablet by mouth in the morning.     trospium  (SANCTURA ) 20 MG tablet Take 1 tablet (20 mg total) by mouth daily. Take in the afternoon/evening. 30 tablet 11   Trospium  Chloride 60 MG CP24 Take 1 capsule (60 mg total) by mouth daily. Take in the morning. 30 capsule 11   predniSONE  (STERAPRED UNI-PAK 21 TAB) 10 MG (21) TBPK tablet Take as directed 1 each 0   No current facility-administered medications on file prior to visit.    Allergies  Allergen Reactions   Ultram   [Tramadol ] Nausea And Vomiting   Percocet [Oxycodone -Acetaminophen ] Nausea And Vomiting   Codeine Nausea And Vomiting   Hydrocodone  Nausea And Vomiting   Vibra -Tab [Doxycycline ] Nausea And Vomiting    Past Medical History:  Diagnosis Date   Cancer (HCC) 2000   right breast stage 2 chemo and radiation   Celiac artery stenosis (HCC)    followed by vascular--- dr shaunna. levy ([facility] in [place])   Chronic rhinitis    Chronic venous insufficiency    Claustrophobia    SEVERE   Claustrophobia    Complication of anesthesia    Depression    Dysrhythmia 2016   GERD (gastroesophageal reflux disease)    GI bleed 09/22/2020   Heart murmur    sees dr levern q 3 months mild murmur per pt   Hiatal hernia    History of adenomatous polyp of colon    History of atrial fibrillation    cardiologist--- dr levern---  first dx 2014,  per pt when at time of recurrent flare-up ischemic colitis in 2016 she was in normal rhythm   History of cancer chemotherapy 2000   for breast cancer   History of cervical dysplasia age 50   s/p  cervical cone bx   History of external beam radiation therapy  2000   right breast cancer   History of ischemic colitis    followed by dr donnald  (w/ margarete)  pt hx recurrent ischemic or infectious colitis   History of ischemic colitis    History of lower GI bleeding    History of right breast cancer oncologist--- dr freddie, arnetta in epic07-19-2016 no recurrence, released prn   dx 01/ 2000,  right breast cancer, Stage II, positive 2 nodes, ER+/  s/p  right breast lumpectomy w/ sln bx's in 2000;  completed chemo/ radiation in 2000   Hyperlipidemia    Hypertension    IDA (iron deficiency anemia) 2020   Myocardial infarction (HCC) 2016   Neuromuscular disorder (HCC)    neropathy lt leg   OA (osteoarthritis)    knees   OSA on CPAP cpap set on 10   followed by dr jude (pulmonology) study in epic 09-01-2012  milds osa   Osteoporosis 06/2016   T score -3.0   Peripheral  vascular disease (HCC)    vericose veins   PONV (postoperative nausea and vomiting)    Pre-diabetes    Pt denies   Right wrist fracture 04/22/2020    Past Surgical History:  Procedure Laterality Date   BREAST BIOPSY Right 2000   BREAST LUMPECTOMY WITH AXILLARY LYMPH NODE BIOPSY Right 2000   CARDIAC CATHETERIZATION N/A 11/25/2014   Procedure: Left Heart Cath and Coronary Angiography;  Surgeon: Salena Negri, MD;  Location: [facility];  Service: Cardiovascular;  Laterality: N/A;   CATARACT EXTRACTION W/ INTRAOCULAR LENS  IMPLANT, BILATERAL Bilateral 2018   CERVICAL CONE BIOPSY  age 89   COLONOSCOPY WITH PROPOFOL  N/A 09/01/2014   Procedure: COLONOSCOPY WITH PROPOFOL ;  Surgeon: Lamar donnald, MD;  Location[facility] ENDOSCOPY;  Service: Endoscopy;  Laterality: N/A;  this will be done unprepped   KNEE ARTHROSCOPY Bilateral right 02/ 2003;  left 07/ 2005  @[facility]    NASAL SINUS SURGERY  02/2013   including deviated septum repair   OPEN REDUCTION INTERNAL FIXATION (ORIF) DISTAL RADIAL FRACTURE Right 04/26/2020   Procedure: OPEN REDUCTION INTERNAL FIXATION (ORIF) RIGHT  DISTAL RADIAL FRACTURE;  Surgeon: Beverley Evalene BIRCH, MD;  Location: [facility];  Service: Orthopedics;  Laterality: Right;   ORIF WRIST FRACTURE Left 2001  @[facility]    TOTAL KNEE ARTHROPLASTY Left 04/09/2017   Procedure: LEFT TOTAL KNEE ARTHROPLASTY;  Surgeon: Beverley Evalene BIRCH, MD;  Location: [facility] OR;  Service: Orthopedics;  Laterality: Left;   TOTAL KNEE ARTHROPLASTY Right 09/20/2020   Procedure: TOTAL KNEE ARTHROPLASTY;  Surgeon: Beverley Evalene BIRCH, MD;  Location: [facility];  Service: Orthopedics;  Laterality: Right;   TUBAL LIGATION Bilateral 1975   VARICOSE VEIN SURGERY  05/2019   WRIST FRACTURE SURGERY Left ~ 2006    Family History  Problem Relation Age of Onset   Hypertension Mother    Heart failure Mother    Heart disease Mother    Hypertension Father    Heart disease Father    Hypertension Sister    Uterine  cancer Sister    Hypertension Brother    Heart disease Brother     Social History   Socioeconomic History   Marital status: Widowed    Spouse name: Not on file   Number of children: 2   Years of education: High school   Highest education level: Not on file  Occupational History   Occupation: part time    Employer: [employer]  Tobacco Use   Smoking status: Former  Current packs/day: 0.00    Average packs/day: 0.1 packs/day for 3.0 years (0.3 ttl pk-yrs)    Types: Cigarettes    Start date: 03/05/1965    Quit date: 03/05/1968    Years since quitting: 55.5   Smokeless tobacco: Never  Vaping Use   Vaping status: Never Used  Substance and Sexual Activity   Alcohol use: No    Alcohol/week: 0.0 standard drinks of alcohol   Drug use: No   Sexual activity: Not Currently    Birth control/protection: Post-menopausal  Other Topics Concern   Not on file  Social History Narrative   09/03/19   From: [place] originally, here since 1980    Living: with daughter Rosaline and granddaughter   Work: working Systems developer - Teacher, English as a foreign language -- but worked in [employer] with [employer] for 25 years      Family: Music therapist and Tawni (lives in [place]) - one granddaughter      Enjoys: mow the yard, walking      Exercise: walking and exercise bike   Diet: healthy - no fats      Safety   Seat belts: Yes    Guns: No   Safe in relationships: Yes    Social Drivers of Corporate investment banker Strain: Low Risk  (07/21/2021)   Overall Financial Resource Strain (CARDIA)    Difficulty of Paying Living Expenses: Not hard at all  Food Insecurity: No Food Insecurity (07/21/2021)   Hunger Vital Sign    Worried About Running Out of Food in the Last Year: Never true    Ran Out of Food in the Last Year: Never true  Transportation Needs: No Transportation Needs (07/21/2021)   PRAPARE - Administrator, Civil Service (Medical): No    Lack of Transportation (Non-Medical): No   Physical Activity: Insufficiently Active (07/21/2021)   Exercise Vital Sign    Days of Exercise per Week: 4 days    Minutes of Exercise per Session: 10 min  Stress: No Stress Concern Present (07/21/2021)   [employer] of Occupational Health - Occupational Stress Questionnaire    Feeling of Stress : Not at all  Social Connections: Not on file  Intimate Partner Violence: Not on file   Review of Systems No N/V Eating okay     Objective:   Physical Exam Constitutional:      Appearance: Normal appearance.  HENT:     Head:     Comments: Mild frontal and maxillary tenderness    Right Ear: Tympanic membrane and ear canal normal.     Left Ear: Tympanic membrane and ear canal normal.     Mouth/Throat:     Pharynx: No oropharyngeal exudate or posterior oropharyngeal erythema.  Pulmonary:     Effort: Pulmonary effort is normal.     Breath sounds: Normal breath sounds. No wheezing or rales.  Musculoskeletal:     Cervical back: Neck supple.  Lymphadenopathy:     Cervical: No cervical adenopathy.  Neurological:     Mental Status: She is alert.            Assessment & Plan:

## 2023-10-01 ENCOUNTER — Ambulatory Visit: Payer: Self-pay

## 2023-10-01 DIAGNOSIS — J029 Acute pharyngitis, unspecified: Secondary | ICD-10-CM

## 2023-10-01 NOTE — Telephone Encounter (Signed)
 Is she feeling better since completing the five days of the augmentin  ? Even though a ten day course sometimes response is in five days. She has a few allergies so I want to see if we can proceed without meds if overall symptoms are improving and we could hold out to see if they continue to improve? Of course if they are worsening then we will consider.

## 2023-10-01 NOTE — Addendum Note (Signed)
 Addended by: CORWIN ANTU on: 10/01/2023 04:45 PM   Modules accepted: Orders

## 2023-10-01 NOTE — Telephone Encounter (Signed)
 FYI Only or Action Required?: Action required by provider: clinical question for provider.  Patient was last seen in primary care on 09/24/2023 by Wanda Charlie FERNS, MD.  Called Nurse Triage reporting Medication Reaction.  Symptoms began several days ago.  Interventions attempted: Rest, hydration, or home remedies.  Symptoms are: completely resolved.  Triage Disposition: Call Pharmacist Within 24 Hours  Copied from CRM [id number]. Topic: Clinical - Prescription Issue >> Oct 01, 2023  3:42 PM Mesmerise C wrote: Reason for CRM: Patient stated she's been having nausea and vomiting along with diarrhea stated she had to stop taking amoxicillin  for her sinus infection stated as soon as she stopped it she felt better took it for 5 days believes she's allergic would like to be prescribed a different medication Reason for Disposition  [1] Caller has medicine question about med NOT prescribed by primary care doctor (or NP/PA) or specialist AND [2] triager unable to answer question (e.g., compatibility with other med, storage)  Answer Assessment - Initial Assessment Questions 1. NAME of MEDICINE: What medicine(s) are you calling about?     amoxicillin  2. QUESTION: What is your question? (e.g., double dose of medicine, side effect)     Pt states that she had taken 4 days of the abx, pt states that during that time she had N/V/D and stomach pain. States that she was allergic to PCN as a child. Pt states she stopped taking the medication and all s/s have completely resolved. Pt would like to know if PCP would like to prescribed a different abx as the amoxicillin  was a 10 day dose and she only completed 4 of the 10 days. Please call her back and advise.  Protocols used: Medication Question Call-A-AH

## 2023-10-03 ENCOUNTER — Other Ambulatory Visit: Payer: Self-pay | Admitting: Family

## 2023-10-03 DIAGNOSIS — G4733 Obstructive sleep apnea (adult) (pediatric): Secondary | ICD-10-CM | POA: Diagnosis not present

## 2023-10-03 DIAGNOSIS — J069 Acute upper respiratory infection, unspecified: Secondary | ICD-10-CM

## 2023-10-03 MED ORDER — AZITHROMYCIN 250 MG PO TABS: ORAL_TABLET | ORAL | 0 refills | Status: AC

## 2023-10-03 NOTE — Telephone Encounter (Signed)
 Called patient states that she stopped abx and gi symptoms. She states that she still is having some congestion. It is better but not cleared up. Wanted to know if something else could be called in for it.

## 2023-10-03 NOTE — Telephone Encounter (Signed)
 Have pt stop augmentin  if still taking, likely causing GI upset.  Sending in ZPACK as pt allergic to a lot

## 2023-10-04 NOTE — Telephone Encounter (Signed)
 Spoke to pt

## 2023-10-14 DIAGNOSIS — G4733 Obstructive sleep apnea (adult) (pediatric): Secondary | ICD-10-CM | POA: Diagnosis not present

## 2023-10-22 DIAGNOSIS — G4733 Obstructive sleep apnea (adult) (pediatric): Secondary | ICD-10-CM | POA: Diagnosis not present

## 2023-11-14 ENCOUNTER — Ambulatory Visit

## 2023-11-20 DIAGNOSIS — M25552 Pain in left hip: Secondary | ICD-10-CM | POA: Diagnosis not present

## 2023-11-22 DIAGNOSIS — G4733 Obstructive sleep apnea (adult) (pediatric): Secondary | ICD-10-CM | POA: Diagnosis not present

## 2023-11-28 ENCOUNTER — Encounter: Payer: Self-pay | Admitting: Family

## 2023-11-28 ENCOUNTER — Ambulatory Visit: Payer: PPO | Admitting: Family

## 2023-11-28 VITALS — BP 118/76 | HR 70 | Temp 98.4°F | Ht 61.5 in | Wt 158.4 lb

## 2023-11-28 DIAGNOSIS — Z0000 Encounter for general adult medical examination without abnormal findings: Secondary | ICD-10-CM

## 2023-11-28 DIAGNOSIS — I10 Essential (primary) hypertension: Secondary | ICD-10-CM | POA: Diagnosis not present

## 2023-11-28 DIAGNOSIS — Z23 Encounter for immunization: Secondary | ICD-10-CM

## 2023-11-28 DIAGNOSIS — E782 Mixed hyperlipidemia: Secondary | ICD-10-CM

## 2023-11-28 DIAGNOSIS — D509 Iron deficiency anemia, unspecified: Secondary | ICD-10-CM | POA: Diagnosis not present

## 2023-11-28 DIAGNOSIS — M8580 Other specified disorders of bone density and structure, unspecified site: Secondary | ICD-10-CM

## 2023-11-28 DIAGNOSIS — Z780 Asymptomatic menopausal state: Secondary | ICD-10-CM

## 2023-11-28 DIAGNOSIS — Z1231 Encounter for screening mammogram for malignant neoplasm of breast: Secondary | ICD-10-CM

## 2023-11-28 DIAGNOSIS — R7303 Prediabetes: Secondary | ICD-10-CM | POA: Diagnosis not present

## 2023-11-28 DIAGNOSIS — I4891 Unspecified atrial fibrillation: Secondary | ICD-10-CM

## 2023-11-28 LAB — LIPID PANEL
Cholesterol: 152 mg/dL (ref 0–200)
HDL: 67.1 mg/dL (ref >39.00)
LDL Cholesterol: 66 mg/dL (ref 0–99)
NonHDL: 84.79
Total CHOL/HDL Ratio: 2
Triglycerides: 94 mg/dL (ref 0.0–149.0)
VLDL: 18.8 mg/dL (ref 0.0–40.0)

## 2023-11-28 LAB — COMPREHENSIVE METABOLIC PANEL WITH GFR
ALT: 16 U/L (ref 0–35)
AST: 17 U/L (ref 0–37)
Albumin: 4.6 g/dL (ref 3.5–5.2)
Alkaline Phosphatase: 77 U/L (ref 39–117)
BUN: 42 mg/dL — ABNORMAL HIGH (ref 6–23)
CO2: 27 meq/L (ref 19–32)
Calcium: 9.9 mg/dL (ref 8.4–10.5)
Chloride: 103 meq/L (ref 96–112)
Creatinine, Ser: 1.49 mg/dL — ABNORMAL HIGH (ref 0.40–1.20)
GFR: 31.53 mL/min — ABNORMAL LOW (ref >60.00)
Glucose, Bld: 93 mg/dL (ref 70–99)
Potassium: 4.8 meq/L (ref 3.5–5.1)
Sodium: 138 meq/L (ref 135–145)
Total Bilirubin: 0.6 mg/dL (ref 0.2–1.2)
Total Protein: 7.8 g/dL (ref 6.0–8.3)

## 2023-11-28 LAB — CBC
HCT: 33.5 % — ABNORMAL LOW (ref 36.0–46.0)
Hemoglobin: 11.1 g/dL — ABNORMAL LOW (ref 12.0–15.0)
MCHC: 33.2 g/dL (ref 30.0–36.0)
MCV: 95.4 fl (ref 78.0–100.0)
Platelets: 241 K/uL (ref 150.0–400.0)
RBC: 3.52 Mil/uL — ABNORMAL LOW (ref 3.87–5.11)
RDW: 14.4 % (ref 11.5–15.5)
WBC: 6.3 K/uL (ref 4.0–10.5)

## 2023-11-28 LAB — HEMOGLOBIN A1C: Hgb A1c MFr Bld: 6.2 % (ref 4.6–6.5)

## 2023-11-28 LAB — MICROALBUMIN / CREATININE URINE RATIO
Creatinine,U: 83.2 mg/dL
Microalb Creat Ratio: 10.1 mg/g (ref 0.0–30.0)
Microalb, Ur: 0.8 mg/dL (ref 0.0–1.9)

## 2023-11-28 MED ORDER — ATORVASTATIN CALCIUM 10 MG PO TABS: 10.0000 mg | ORAL_TABLET | Freq: Every day | ORAL | Status: DC

## 2023-11-28 NOTE — Progress Notes (Signed)
 Subjective:  Patient ID: Wanda Green, female    DOB: 27-Mar-1934  Age: 89 y.o. MRN: [id number]  Patient Care Team: Corwin Antu, FNP as PCP - General (Family Medicine) Freddie Lynwood HERO, MD as Consulting Physician (Oncology) Donnald Charleston, MD as Consulting Physician (Gastroenterology) Levern Hutching, MD as Consulting Physician (Cardiology)   CC:  Chief Complaint  Patient presents with   Annual Exam    HPI Wanda Green is a 89 y.o. female who presents today for an annual physical exam. She reports consuming a general diet. Exercise is limited by orthopedic condition(s): left leg pain. She generally feels well. She reports sleeping fairly well. She does not have additional problems to discuss today.   Vision:Within last year Dental:Receives regular dental care  Mammogram: 06/08/22 Colonoscopy:09/16/24 completed due to age  Bone density scan: 2018  Pt is without acute concerns.   Discussed the use of AI scribe software for clinical note transcription with the patient, who gave verbal consent to proceed.  History of Present Illness Wanda Green is an 89 year old female who presents with muscle pain after tripping over a commercial rack.  She experiences muscle pain after tripping over a commercial rack that has been in her room for four years. The rack is heavy, rolls, and is constantly in her way. Following the incident, she has returned to using her cane. She reports that an X-ray was performed and she was told it did not look like she hurt her hip. She reports muscle pain and was told by Dr. Beverley that it was more likely muscular rather than a hip injury.  She is overdue for a mammogram and has not had a bone density test since 2018.  She has a history of sleep apnea and uses a CPAP machine, which she finds uncomfortable but tries to use every night. She is currently taking 10 mg of cholesterol medication, which was reduced due to low cholesterol levels. She  experienced muscle and joint pain previously, which improved after reducing the medication. She has been on prednisone  twice, which she believes helped with any inflammation.  ' Advanced Directives Patient does not have advanced directives     DEPRESSION SCREENING    09/24/2023   11:17 AM 11/22/2022   11:01 AM 05/04/2022   10:11 AM 04/19/2022   11:04 AM 07/21/2021   11:55 AM 04/28/2018   11:01 AM 08/02/2017   11:30 AM  PHQ 2/9 Scores  PHQ - 2 Score 0 0 0 0 0 0 0  PHQ- 9 Score  0 0 0        ROS: Negative unless specifically indicated above in HPI.    Current Outpatient Medications:    acetaminophen  (TYLENOL ) 500 MG tablet, Take 1,000 mg by mouth daily as needed for headache, fever, moderate pain or mild pain., Disp: , Rfl:    amLODipine  (NORVASC ) 2.5 MG tablet, Take 2.5 mg by mouth at bedtime., Disp: , Rfl: 3   aspirin  EC 81 MG tablet, Take 81 mg by mouth daily. Swallow whole., Disp: , Rfl:    atorvastatin  (LIPITOR) 10 MG tablet, Take 1 tablet (10 mg total) by mouth daily., Disp: , Rfl:    fluticasone  (FLONASE ) 50 MCG/ACT nasal spray, Place 2 sprays into both nostrils daily., Disp: 16 g, Rfl: 0   losartan  (COZAAR ) 100 MG tablet, Take 100 mg by mouth in the morning., Disp: , Rfl:    metoprolol  succinate (TOPROL -XL) 25 MG 24 hr tablet, Take 25 mg by mouth in  the morning. Take 25mg  in the morning and 50mg  at night., Disp: , Rfl:    metoprolol  succinate (TOPROL -XL) 50 MG 24 hr tablet, Take 50 mg by mouth at bedtime. Take with or immediately following a meal., Disp: , Rfl:    Multiple Vitamin (MULTIVITAMIN) tablet, Take 1 tablet by mouth in the morning., Disp: , Rfl:    Probiotic Product (PROBIOTIC DAILY PO), Take 1 tablet by mouth in the morning., Disp: , Rfl:    trospium  (SANCTURA ) 20 MG tablet, Take 1 tablet (20 mg total) by mouth daily. Take in the afternoon/evening., Disp: 30 tablet, Rfl: 11   Trospium  Chloride 60 MG CP24, Take 1 capsule (60 mg total) by mouth daily. Take in the  morning., Disp: 30 capsule, Rfl: 11    Objective:    BP 118/76 (BP Location: Left Arm, Patient Position: Sitting, Cuff Size: Normal)   Pulse 70   Temp 98.4 F (36.9 C) (Temporal)   Ht 5' 1.5 (1.562 m)   Wt 158 lb 6.4 oz (71.8 kg)   LMP 07/22/2015   SpO2 98%   BMI 29.44 kg/m   BP Readings from Last 3 Encounters:  11/28/23 118/76  09/24/23 120/72  09/03/23 128/70      Physical Exam Constitutional:      General: She is not in acute distress.    Appearance: Normal appearance. She is normal weight. She is not ill-appearing.  HENT:     Head: Normocephalic.     Right Ear: Tympanic membrane normal.     Left Ear: Tympanic membrane normal.     Nose: Nose normal.     Mouth/Throat:     Mouth: Mucous membranes are moist.  Eyes:     Extraocular Movements: Extraocular movements intact.     Pupils: Pupils are equal, round, and reactive to light.  Cardiovascular:     Rate and Rhythm: Normal rate and regular rhythm.  Pulmonary:     Effort: Pulmonary effort is normal.     Breath sounds: Normal breath sounds.  Abdominal:     General: Abdomen is flat. Bowel sounds are normal.     Palpations: Abdomen is soft.     Tenderness: There is no guarding or rebound.  Musculoskeletal:        General: Normal range of motion.     Cervical back: Normal range of motion.  Skin:    General: Skin is warm.     Capillary Refill: Capillary refill takes less than 2 seconds.  Neurological:     General: No focal deficit present.     Mental Status: She is alert.  Psychiatric:        Mood and Affect: Mood normal.        Behavior: Behavior normal.        Thought Content: Thought content normal.        Judgment: Judgment normal.       Results RADIOLOGY Hip X-ray: No fracture or dislocation noted; muscular issue suspected.      Assessment & Plan:   Assessment and Plan Assessment & Plan Urinary incontinence Urge incontinence persists despite current medication regimen. Trospium  is  ineffective. She is scheduled to see her specialist in a couple of weeks to discuss further management options. Botox was mentioned as a potential next step, but she expressed concerns due to advice from a family member with Corporate investment banker experience. - Continue Trospium  until specialist appointment - Discuss potential next steps, including Botox, with specialist  Obstructive sleep apnea Managed with  CPAP therapy. She reports nightly use but finds it uncomfortable. - Continue CPAP therapy nightly  Hyperlipidemia Well-controlled with atorvastatin . She reports reduced muscle pain after decreasing the dosage as advised by her cardiologist. - Continue current atorvastatin  dosage as per cardiologist's advice - Check cholesterol levels every six months  Acute wellness exam  Patient Counseling(The following topics were reviewed):  Preventative care handout given to pt  Health maintenance and immunizations reviewed. Please refer to Health maintenance section. Pt advised on safe sex, wearing seatbelts in car, and proper nutrition labwork ordered today for annual Dental health: Discussed importance of regular tooth brushing, flossing, and dental visits.         Follow-up: Return in about 1 year (around 11/27/2024) for f/u CPE.   Ginger Patrick, FNP

## 2023-12-01 ENCOUNTER — Ambulatory Visit: Payer: Self-pay | Admitting: Family

## 2023-12-20 DIAGNOSIS — I10 Essential (primary) hypertension: Secondary | ICD-10-CM | POA: Diagnosis not present

## 2023-12-20 DIAGNOSIS — I2510 Atherosclerotic heart disease of native coronary artery without angina pectoris: Secondary | ICD-10-CM | POA: Diagnosis not present

## 2023-12-20 DIAGNOSIS — E782 Mixed hyperlipidemia: Secondary | ICD-10-CM | POA: Diagnosis not present

## 2023-12-20 DIAGNOSIS — K219 Gastro-esophageal reflux disease without esophagitis: Secondary | ICD-10-CM | POA: Diagnosis not present

## 2023-12-23 ENCOUNTER — Ambulatory Visit: Admitting: Podiatry

## 2023-12-23 ENCOUNTER — Encounter: Payer: Self-pay | Admitting: Podiatry

## 2023-12-23 DIAGNOSIS — M79675 Pain in left toe(s): Secondary | ICD-10-CM | POA: Diagnosis not present

## 2023-12-23 DIAGNOSIS — M79674 Pain in right toe(s): Secondary | ICD-10-CM

## 2023-12-23 DIAGNOSIS — B351 Tinea unguium: Secondary | ICD-10-CM | POA: Diagnosis not present

## 2023-12-29 ENCOUNTER — Encounter: Payer: Self-pay | Admitting: Podiatry

## 2023-12-29 NOTE — Progress Notes (Signed)
  Subjective:  Patient ID: Wanda Green, female    DOB: 1934-05-04,  MRN: [id number]  Wanda Green presents to clinic today for painful mycotic toenails of both feet that are difficult to trim. Pain interferes with daily activities and wearing enclosed shoe gear comfortably.  Chief Complaint  Patient presents with   Toe Pain    A1c is 6.2. NP Corwin is her PCP. Last visit was in Sept.   New problem(s): None.   PCP is Corwin Antu, FNP.  Allergies  Allergen Reactions   Augmentin  [Amoxicillin -Pot Clavulanate] Nausea And Vomiting and Other (See Comments)    Mouth pain   Ultram  [Tramadol ] Nausea And Vomiting   Percocet [Oxycodone -Acetaminophen ] Nausea And Vomiting   Codeine Nausea And Vomiting   Hydrocodone  Nausea And Vomiting   Vibra -Tab [Doxycycline ] Nausea And Vomiting    Review of Systems: Negative except as noted in the HPI.  Objective: No changes noted in today's physical examination. There were no vitals filed for this visit. Wanda Green is a pleasant 89 y.o. female WD, WN in NAD. AAO x 3.   Vascular Examination: Capillary refill time immediate b/l. Vascular status intact b/l with palpable pedal pulses. Pedal hair present b/l. No pain with calf compression b/l. Skin temperature gradient WNL b/l. No cyanosis or clubbing b/l. No ischemia or gangrene noted b/l.   Neurological Examination: Sensation grossly intact b/l with 10 gram monofilament. Vibratory sensation intact b/l.   Dermatological Examination: Pedal skin with normal turgor, texture and tone b/l.  No open wounds. No interdigital macerations.   Toenails 1-5 b/l thick, discolored, elongated with subungual debris and pain on dorsal palpation.   No corns, calluses, nor porokeratotic lesions.  Musculoskeletal Examination: Muscle strength 5/5 to all lower extremity muscle groups bilaterally. Muscle strength 5/5 to all lower extremity muscle groups bilaterally. HAV with bunion deformity noted b/l LE.SABRA No  pain, crepitus or joint limitation noted with ROM b/l LE.  Patient ambulates independently without assistive aids.. No pain, crepitus or joint limitation noted with ROM b/l LE.  Patient ambulates independently without assistive aids.  Radiographs: None  Last A1c:      Latest Ref Rng & Units 11/28/2023   11:48 AM 07/12/2023   10:41 AM  Hemoglobin A1C  Hemoglobin-A1c 4.6 - 6.5 % 6.2  5.9    Assessment/Plan: 1. Pain due to onychomycosis of toenails of both feet   Patient was evaluated and treated. All patient's and/or POA's questions/concerns addressed on today's visit. Toenails 1-5 b/l debrided in length and girth without incident. Continue soft, supportive shoe gear daily. Report any pedal injuries to medical professional. Call office if there are any questions/concerns. -Patient/POA to call should there be question/concern in the interim.   Return in about 3 months (around 03/24/2024).  Delon LITTIE Merlin, DPM      [facility] LOCATION: [address], [place] [address]                   Office [phone]   [facility] LOCATION: [address] [place], [place] [address] Office [phone]

## 2023-12-30 ENCOUNTER — Ambulatory Visit: Admitting: Family

## 2023-12-30 VITALS — BP 124/70 | HR 82 | Temp 98.6°F | Ht 61.5 in | Wt 162.0 lb

## 2023-12-30 DIAGNOSIS — H9313 Tinnitus, bilateral: Secondary | ICD-10-CM | POA: Insufficient documentation

## 2023-12-30 DIAGNOSIS — E782 Mixed hyperlipidemia: Secondary | ICD-10-CM | POA: Diagnosis not present

## 2023-12-30 MED ORDER — ATORVASTATIN CALCIUM 10 MG PO TABS: 5.0000 mg | ORAL_TABLET | Freq: Every day | ORAL | Status: AC
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
## 2023-12-30 NOTE — Patient Instructions (Signed)
  A referral was placed today for audiology Please let us  know if you have not heard back within 2 weeks about the referral.

## 2024-01-08 ENCOUNTER — Ambulatory Visit: Attending: Audiologist | Admitting: Audiologist

## 2024-01-08 DIAGNOSIS — H903 Sensorineural hearing loss, bilateral: Secondary | ICD-10-CM | POA: Insufficient documentation

## 2024-01-08 DIAGNOSIS — H9313 Tinnitus, bilateral: Secondary | ICD-10-CM | POA: Insufficient documentation

## 2024-01-09 ENCOUNTER — Ambulatory Visit: Admitting: Audiologist

## 2024-01-09 DIAGNOSIS — H9313 Tinnitus, bilateral: Secondary | ICD-10-CM | POA: Diagnosis not present

## 2024-01-09 DIAGNOSIS — H903 Sensorineural hearing loss, bilateral: Secondary | ICD-10-CM | POA: Diagnosis not present

## 2024-01-09 NOTE — Procedures (Signed)
  Outpatient Audiology and [facility] [address] [place], [place]  [address] [phone]  AUDIOLOGICAL  EVALUATION  NAME: [name]     DOB:   March 04, 1935      MRN: [id number]                                                                                     DATE: 01/09/2024     REFERENT: Corwin Antu, FNP STATUS: Outpatient DIAGNOSIS: Sensorineural Hearing Loss, Tinnitus    History: Wanda Green was seen for an audiological evaluation due to tinnitus in both ears that has been getting louder. For the last six months her tinnitus has been louder and is very bothersome. She reports lots of stress at home and noise with two young grandchildren. She also acknowledges she likely has hearing loss but is not aware of it. The tinnitus is a high pitched /eee/ sound. It is keeping her from sleeping well. It is making her stress worse.  Wanda Green has chronic sinus issues. She said her doctor in [place] helps her manager her congestion. She was told its just part of aging.  Wanda Green has no history of hazardous noise exposure.  Medical history shows no additional risk for hearing loss.    Evaluation:  Otoscopy showed a clear view of the tympanic membranes, bilaterally Tympanometry results were consistent with normal but shallow middle ear function, Wanda Green reported pain with measurement.  Audiometric testing was completed using Conventional Audiometry techniques with insert earphones and supraural headphones. Test results are consistent with normal sloping to severe sensorineural hearing loss bilaterally. Speech Recognition Thresholds were obtained at 30 dB HL in the right ear and at 30  dB HL in the left ear. Word Recognition Testing was completed at  40dB SL and Wanda Green scored 100% in each ear.    Results:  The test results were reviewed with Wanda Green. She has bilateral severe high pitched sensorineural  hearing loss. Wanda Green's tinnitus is most likely from the high pitched loss. Using  hearing aids will help decrease the tinnitus awareness. Stress will cause a spike in tinnitus as well.   Audiogram printed and provided to Wanda Green.    Recommendations: Hearing aids recommended for both ears. Patient given list of local hearing aid providers. These will also help with the tinnitus.  Try to lower stress levels as tinnitus and stress are correlated. See packet for mindfulness class for tinnitus patients that is offered online.  See tinnitus packet for information about masking to help lessen impact of tinnitus at night.  Annual audiometric testing recommended to monitor hearing loss for progression.   35 minutes spent testing and counseling on results.   If you have any questions please feel free to contact me at [phone].  Lauraine Ka Stalnaker Au.D.  Audiologist   01/09/2024  12:07 PM  Cc: Corwin Antu, FNP

## 2024-04-09 ENCOUNTER — Ambulatory Visit: Admitting: Podiatry

## 2024-04-27 ENCOUNTER — Ambulatory Visit: Admitting: Podiatry

## 2024-05-29 ENCOUNTER — Ambulatory Visit: Admitting: Physician Assistant
